# Patient Record
Sex: Female | Born: 1955
Health system: Southern US, Community
[De-identification: ages and names within clinical notes are randomized; demographics above are authoritative.]

## PROBLEM LIST (undated history)

## (undated) DIAGNOSIS — Z9889 Other specified postprocedural states: Secondary | ICD-10-CM

## (undated) DIAGNOSIS — R519 Headache, unspecified: Secondary | ICD-10-CM

## (undated) DIAGNOSIS — N3281 Overactive bladder: Secondary | ICD-10-CM

## (undated) DIAGNOSIS — M79606 Pain in leg, unspecified: Secondary | ICD-10-CM

## (undated) DIAGNOSIS — R102 Pelvic and perineal pain: Secondary | ICD-10-CM

## (undated) DIAGNOSIS — F99 Mental disorder, not otherwise specified: Secondary | ICD-10-CM

## (undated) DIAGNOSIS — M797 Fibromyalgia: Secondary | ICD-10-CM

## (undated) DIAGNOSIS — M549 Dorsalgia, unspecified: Secondary | ICD-10-CM

## (undated) DIAGNOSIS — G4733 Obstructive sleep apnea (adult) (pediatric): Secondary | ICD-10-CM

## (undated) DIAGNOSIS — G8929 Other chronic pain: Secondary | ICD-10-CM

## (undated) DIAGNOSIS — I10 Essential (primary) hypertension: Secondary | ICD-10-CM

## (undated) DIAGNOSIS — R569 Unspecified convulsions: Secondary | ICD-10-CM

## (undated) DIAGNOSIS — E7800 Pure hypercholesterolemia, unspecified: Secondary | ICD-10-CM

## (undated) DIAGNOSIS — R51 Headache: Secondary | ICD-10-CM

## (undated) DIAGNOSIS — E119 Type 2 diabetes mellitus without complications: Secondary | ICD-10-CM

## (undated) DIAGNOSIS — N816 Rectocele: Secondary | ICD-10-CM

## (undated) DIAGNOSIS — F410 Panic disorder [episodic paroxysmal anxiety] without agoraphobia: Secondary | ICD-10-CM

## (undated) DIAGNOSIS — E039 Hypothyroidism, unspecified: Secondary | ICD-10-CM

## (undated) HISTORY — DX: Mental disorder, not otherwise specified: F99

## (undated) HISTORY — DX: Overactive bladder: N32.81

## (undated) HISTORY — PX: OTHER SURGICAL HISTORY: SHX169

## (undated) HISTORY — PX: BACK SURGERY: SHX140

## (undated) HISTORY — DX: Unspecified convulsions: R56.9

## (undated) HISTORY — PX: ANKLE SURGERY: SHX546

## (undated) HISTORY — DX: Rectocele: N81.6

## (undated) HISTORY — PX: CARPAL TUNNEL RELEASE: SHX101

## (undated) HISTORY — PX: ABDOMINAL HYSTERECTOMY: SHX81

## (undated) HISTORY — PX: TONSILLECTOMY: SUR1361

## (undated) HISTORY — PX: TUBAL LIGATION: SHX77

---

## 2003-08-23 ENCOUNTER — Ambulatory Visit (HOSPITAL_COMMUNITY): Admission: RE | Admit: 2003-08-23 | Discharge: 2003-08-23 | Payer: Self-pay | Admitting: Family Medicine

## 2003-08-23 ENCOUNTER — Encounter: Payer: Self-pay | Admitting: Family Medicine

## 2004-04-29 ENCOUNTER — Ambulatory Visit (HOSPITAL_COMMUNITY): Admission: RE | Admit: 2004-04-29 | Discharge: 2004-04-29 | Payer: Self-pay | Admitting: Family Medicine

## 2004-08-18 ENCOUNTER — Emergency Department (HOSPITAL_COMMUNITY): Admission: EM | Admit: 2004-08-18 | Discharge: 2004-08-18 | Payer: Self-pay | Admitting: Emergency Medicine

## 2004-11-25 ENCOUNTER — Ambulatory Visit (HOSPITAL_COMMUNITY): Admission: RE | Admit: 2004-11-25 | Discharge: 2004-11-25 | Payer: Self-pay | Admitting: Family Medicine

## 2004-12-14 ENCOUNTER — Emergency Department (HOSPITAL_COMMUNITY): Admission: EM | Admit: 2004-12-14 | Discharge: 2004-12-14 | Payer: Self-pay | Admitting: Emergency Medicine

## 2005-01-22 ENCOUNTER — Inpatient Hospital Stay (HOSPITAL_COMMUNITY): Admission: RE | Admit: 2005-01-22 | Discharge: 2005-01-26 | Payer: Self-pay | Admitting: Orthopaedic Surgery

## 2005-03-04 ENCOUNTER — Encounter (HOSPITAL_COMMUNITY): Admission: RE | Admit: 2005-03-04 | Discharge: 2005-04-03 | Payer: Self-pay | Admitting: Orthopaedic Surgery

## 2005-08-29 ENCOUNTER — Observation Stay (HOSPITAL_COMMUNITY): Admission: EM | Admit: 2005-08-29 | Discharge: 2005-08-30 | Payer: Self-pay | Admitting: Emergency Medicine

## 2005-08-30 ENCOUNTER — Ambulatory Visit: Payer: Self-pay | Admitting: *Deleted

## 2005-09-07 ENCOUNTER — Ambulatory Visit: Payer: Self-pay | Admitting: *Deleted

## 2005-09-07 ENCOUNTER — Encounter (HOSPITAL_COMMUNITY): Admission: RE | Admit: 2005-09-07 | Discharge: 2005-09-08 | Payer: Self-pay | Admitting: *Deleted

## 2005-09-15 ENCOUNTER — Ambulatory Visit: Payer: Self-pay | Admitting: *Deleted

## 2005-09-30 ENCOUNTER — Inpatient Hospital Stay (HOSPITAL_BASED_OUTPATIENT_CLINIC_OR_DEPARTMENT_OTHER): Admission: RE | Admit: 2005-09-30 | Discharge: 2005-09-30 | Payer: Self-pay | Admitting: *Deleted

## 2005-09-30 ENCOUNTER — Ambulatory Visit: Payer: Self-pay | Admitting: *Deleted

## 2005-10-20 ENCOUNTER — Ambulatory Visit: Payer: Self-pay | Admitting: *Deleted

## 2005-11-29 ENCOUNTER — Ambulatory Visit: Admission: RE | Admit: 2005-11-29 | Discharge: 2005-11-29 | Payer: Self-pay | Admitting: Family Medicine

## 2005-12-10 ENCOUNTER — Ambulatory Visit: Payer: Self-pay | Admitting: Pulmonary Disease

## 2006-01-23 ENCOUNTER — Emergency Department (HOSPITAL_COMMUNITY): Admission: EM | Admit: 2006-01-23 | Discharge: 2006-01-23 | Payer: Self-pay | Admitting: Emergency Medicine

## 2006-01-31 ENCOUNTER — Ambulatory Visit: Payer: Self-pay | Admitting: Internal Medicine

## 2006-02-09 ENCOUNTER — Ambulatory Visit: Payer: Self-pay | Admitting: Internal Medicine

## 2006-02-09 ENCOUNTER — Ambulatory Visit (HOSPITAL_COMMUNITY): Admission: RE | Admit: 2006-02-09 | Discharge: 2006-02-09 | Payer: Self-pay | Admitting: Internal Medicine

## 2006-07-26 ENCOUNTER — Ambulatory Visit (HOSPITAL_COMMUNITY): Admission: RE | Admit: 2006-07-26 | Discharge: 2006-07-26 | Payer: Self-pay | Admitting: Family Medicine

## 2006-09-14 ENCOUNTER — Ambulatory Visit (HOSPITAL_COMMUNITY): Admission: RE | Admit: 2006-09-14 | Discharge: 2006-09-14 | Payer: Self-pay | Admitting: Family Medicine

## 2007-01-02 ENCOUNTER — Inpatient Hospital Stay (HOSPITAL_COMMUNITY): Admission: RE | Admit: 2007-01-02 | Discharge: 2007-01-06 | Payer: Self-pay | Admitting: Orthopaedic Surgery

## 2007-02-08 ENCOUNTER — Encounter (HOSPITAL_COMMUNITY): Admission: RE | Admit: 2007-02-08 | Discharge: 2007-03-10 | Payer: Self-pay | Admitting: Orthopaedic Surgery

## 2007-05-19 ENCOUNTER — Ambulatory Visit (HOSPITAL_COMMUNITY): Admission: RE | Admit: 2007-05-19 | Discharge: 2007-05-19 | Payer: Self-pay | Admitting: Obstetrics & Gynecology

## 2007-08-15 ENCOUNTER — Ambulatory Visit: Payer: Self-pay | Admitting: Cardiology

## 2007-08-15 ENCOUNTER — Observation Stay (HOSPITAL_COMMUNITY): Admission: AD | Admit: 2007-08-15 | Discharge: 2007-08-16 | Payer: Self-pay | Admitting: Family Medicine

## 2007-09-07 ENCOUNTER — Emergency Department (HOSPITAL_COMMUNITY): Admission: EM | Admit: 2007-09-07 | Discharge: 2007-09-07 | Payer: Self-pay | Admitting: Emergency Medicine

## 2008-01-04 ENCOUNTER — Emergency Department (HOSPITAL_COMMUNITY): Admission: EM | Admit: 2008-01-04 | Discharge: 2008-01-04 | Payer: Self-pay | Admitting: Emergency Medicine

## 2008-05-10 ENCOUNTER — Ambulatory Visit (HOSPITAL_COMMUNITY): Admission: RE | Admit: 2008-05-10 | Discharge: 2008-05-10 | Payer: Self-pay | Admitting: Family Medicine

## 2008-10-07 ENCOUNTER — Emergency Department (HOSPITAL_COMMUNITY): Admission: EM | Admit: 2008-10-07 | Discharge: 2008-10-07 | Payer: Self-pay | Admitting: Emergency Medicine

## 2009-01-01 ENCOUNTER — Emergency Department (HOSPITAL_COMMUNITY): Admission: EM | Admit: 2009-01-01 | Discharge: 2009-01-01 | Payer: Self-pay | Admitting: Emergency Medicine

## 2009-01-08 ENCOUNTER — Ambulatory Visit (HOSPITAL_COMMUNITY): Admission: RE | Admit: 2009-01-08 | Discharge: 2009-01-08 | Payer: Self-pay | Admitting: Family Medicine

## 2009-06-02 ENCOUNTER — Emergency Department (HOSPITAL_COMMUNITY): Admission: EM | Admit: 2009-06-02 | Discharge: 2009-06-02 | Payer: Self-pay | Admitting: Emergency Medicine

## 2009-06-08 ENCOUNTER — Emergency Department (HOSPITAL_COMMUNITY): Admission: EM | Admit: 2009-06-08 | Discharge: 2009-06-08 | Payer: Self-pay | Admitting: Emergency Medicine

## 2009-07-02 ENCOUNTER — Ambulatory Visit (HOSPITAL_COMMUNITY): Admission: RE | Admit: 2009-07-02 | Discharge: 2009-07-02 | Payer: Self-pay | Admitting: Family Medicine

## 2009-07-02 ENCOUNTER — Inpatient Hospital Stay (HOSPITAL_COMMUNITY): Admission: AD | Admit: 2009-07-02 | Discharge: 2009-07-07 | Payer: Self-pay | Admitting: Emergency Medicine

## 2009-07-02 ENCOUNTER — Ambulatory Visit: Payer: Self-pay | Admitting: Orthopedic Surgery

## 2009-07-03 ENCOUNTER — Encounter: Payer: Self-pay | Admitting: Orthopedic Surgery

## 2009-07-06 ENCOUNTER — Encounter: Payer: Self-pay | Admitting: Orthopedic Surgery

## 2009-07-22 ENCOUNTER — Ambulatory Visit: Payer: Self-pay | Admitting: Orthopedic Surgery

## 2009-07-22 DIAGNOSIS — M86679 Other chronic osteomyelitis, unspecified ankle and foot: Secondary | ICD-10-CM | POA: Insufficient documentation

## 2009-07-22 DIAGNOSIS — I10 Essential (primary) hypertension: Secondary | ICD-10-CM

## 2009-08-11 ENCOUNTER — Encounter: Payer: Self-pay | Admitting: Orthopedic Surgery

## 2009-08-13 ENCOUNTER — Ambulatory Visit: Payer: Self-pay | Admitting: Orthopedic Surgery

## 2009-09-04 ENCOUNTER — Encounter: Payer: Self-pay | Admitting: Orthopedic Surgery

## 2009-10-24 ENCOUNTER — Emergency Department (HOSPITAL_COMMUNITY): Admission: EM | Admit: 2009-10-24 | Discharge: 2009-10-24 | Payer: Self-pay | Admitting: Emergency Medicine

## 2009-10-26 ENCOUNTER — Emergency Department (HOSPITAL_COMMUNITY): Admission: EM | Admit: 2009-10-26 | Discharge: 2009-10-26 | Payer: Self-pay | Admitting: Emergency Medicine

## 2010-01-05 ENCOUNTER — Emergency Department (HOSPITAL_COMMUNITY): Admission: EM | Admit: 2010-01-05 | Discharge: 2010-01-05 | Payer: Self-pay | Admitting: Emergency Medicine

## 2010-03-02 ENCOUNTER — Ambulatory Visit (HOSPITAL_COMMUNITY): Admission: RE | Admit: 2010-03-02 | Discharge: 2010-03-02 | Payer: Self-pay | Admitting: Family Medicine

## 2010-03-26 ENCOUNTER — Ambulatory Visit (HOSPITAL_COMMUNITY): Admission: RE | Admit: 2010-03-26 | Discharge: 2010-03-26 | Payer: Self-pay | Admitting: Orthopaedic Surgery

## 2010-05-26 ENCOUNTER — Ambulatory Visit (HOSPITAL_COMMUNITY)
Admission: RE | Admit: 2010-05-26 | Discharge: 2010-05-26 | Payer: Self-pay | Source: Home / Self Care | Admitting: Orthopaedic Surgery

## 2010-07-13 ENCOUNTER — Encounter (HOSPITAL_COMMUNITY): Admission: RE | Admit: 2010-07-13 | Discharge: 2010-08-12 | Payer: Self-pay | Admitting: Orthopaedic Surgery

## 2010-07-29 ENCOUNTER — Emergency Department (HOSPITAL_COMMUNITY): Admission: EM | Admit: 2010-07-29 | Discharge: 2010-07-29 | Payer: Self-pay | Admitting: Emergency Medicine

## 2010-08-06 ENCOUNTER — Ambulatory Visit (HOSPITAL_COMMUNITY): Admission: RE | Admit: 2010-08-06 | Discharge: 2010-08-06 | Payer: Self-pay | Admitting: Family Medicine

## 2010-08-18 ENCOUNTER — Encounter (HOSPITAL_COMMUNITY): Admission: RE | Admit: 2010-08-18 | Discharge: 2010-09-17 | Payer: Self-pay | Admitting: Orthopaedic Surgery

## 2010-08-23 ENCOUNTER — Emergency Department (HOSPITAL_COMMUNITY): Admission: EM | Admit: 2010-08-23 | Discharge: 2010-08-23 | Payer: Self-pay | Admitting: Emergency Medicine

## 2010-10-25 ENCOUNTER — Emergency Department (HOSPITAL_COMMUNITY): Admission: EM | Admit: 2010-10-25 | Discharge: 2010-10-25 | Payer: Self-pay | Admitting: Emergency Medicine

## 2011-02-09 ENCOUNTER — Emergency Department (HOSPITAL_COMMUNITY)
Admission: EM | Admit: 2011-02-09 | Discharge: 2011-02-09 | Disposition: A | Discharge status: Home / Self Care | Payer: Medicare Other | Attending: Emergency Medicine | Admitting: Emergency Medicine

## 2011-02-09 ENCOUNTER — Emergency Department (HOSPITAL_COMMUNITY): Payer: Medicare Other

## 2011-02-09 DIAGNOSIS — R0602 Shortness of breath: Secondary | ICD-10-CM | POA: Insufficient documentation

## 2011-02-09 DIAGNOSIS — Z79899 Other long term (current) drug therapy: Secondary | ICD-10-CM | POA: Insufficient documentation

## 2011-02-09 DIAGNOSIS — R5383 Other fatigue: Secondary | ICD-10-CM | POA: Insufficient documentation

## 2011-02-09 DIAGNOSIS — R059 Cough, unspecified: Secondary | ICD-10-CM | POA: Insufficient documentation

## 2011-02-09 DIAGNOSIS — R5381 Other malaise: Secondary | ICD-10-CM | POA: Insufficient documentation

## 2011-02-09 DIAGNOSIS — R071 Chest pain on breathing: Secondary | ICD-10-CM | POA: Insufficient documentation

## 2011-02-09 DIAGNOSIS — R05 Cough: Secondary | ICD-10-CM | POA: Insufficient documentation

## 2011-02-09 DIAGNOSIS — I10 Essential (primary) hypertension: Secondary | ICD-10-CM | POA: Insufficient documentation

## 2011-02-09 DIAGNOSIS — J45909 Unspecified asthma, uncomplicated: Secondary | ICD-10-CM | POA: Insufficient documentation

## 2011-02-09 DIAGNOSIS — E7800 Pure hypercholesterolemia, unspecified: Secondary | ICD-10-CM | POA: Insufficient documentation

## 2011-02-25 LAB — BASIC METABOLIC PANEL
BUN: 12 mg/dL (ref 6–23)
Calcium: 9.1 mg/dL (ref 8.4–10.5)
GFR calc non Af Amer: >60 mL/min (ref >60)
Glucose, Bld: 110 mg/dL — ABNORMAL HIGH (ref 70–99)
Potassium: 4.7 mEq/L (ref 3.5–5.1)
Sodium: 141 mEq/L (ref 135–145)

## 2011-02-25 LAB — URINALYSIS, ROUTINE W REFLEX MICROSCOPIC
Bilirubin Urine: NEGATIVE
Ketones, ur: NEGATIVE mg/dL
Nitrite: NEGATIVE
Specific Gravity, Urine: 1.015 (ref 1.005–1.030)
Urobilinogen, UA: 0.2 mg/dL (ref 0.0–1.0)
pH: 7 (ref 5.0–8.0)

## 2011-02-25 LAB — CBC
MCHC: 34 g/dL (ref 30.0–36.0)
RDW: 13.5 % (ref 11.5–15.5)
WBC: 4.9 10*3/uL (ref 4.0–10.5)

## 2011-02-25 LAB — DIFFERENTIAL
Basophils Absolute: 0.1 10*3/uL (ref 0.0–0.1)
Basophils Relative: 1 % (ref 0–1)
Lymphocytes Relative: 35 % (ref 12–46)
Neutro Abs: 2.5 10*3/uL (ref 1.7–7.7)
Neutrophils Relative %: 51 % (ref 43–77)

## 2011-02-28 LAB — BASIC METABOLIC PANEL
Chloride: 102 mEq/L (ref 96–112)
GFR calc Af Amer: >60 mL/min (ref >60)
GFR calc non Af Amer: >60 mL/min (ref >60)
Potassium: 4.2 mEq/L (ref 3.5–5.1)
Sodium: 136 mEq/L (ref 135–145)

## 2011-02-28 LAB — POCT CARDIAC MARKERS
CKMB, poc: 1.2 ng/mL (ref 1.0–8.0)
Myoglobin, poc: 72.3 ng/mL (ref 12–200)
Myoglobin, poc: 83.9 ng/mL (ref 12–200)

## 2011-02-28 LAB — D-DIMER, QUANTITATIVE: D-Dimer, Quant: 0.27 ug/mL-FEU (ref 0.00–0.48)

## 2011-02-28 LAB — CBC
HCT: 40 % (ref 36.0–46.0)
Hemoglobin: 13.6 g/dL (ref 12.0–15.0)
Platelets: 179 10*3/uL (ref 150–400)
RBC: 4.58 MIL/uL (ref 3.87–5.11)
WBC: 4.4 10*3/uL (ref 4.0–10.5)

## 2011-02-28 LAB — DIFFERENTIAL
Eosinophils Absolute: 0.1 10*3/uL (ref 0.0–0.7)
Eosinophils Relative: 3 % (ref 0–5)
Lymphs Abs: 1.4 10*3/uL (ref 0.7–4.0)
Monocytes Relative: 11 % (ref 3–12)

## 2011-03-01 LAB — URINALYSIS, ROUTINE W REFLEX MICROSCOPIC
Glucose, UA: NEGATIVE mg/dL
Hgb urine dipstick: NEGATIVE
Specific Gravity, Urine: >1.03 — ABNORMAL HIGH (ref 1.005–1.030)
Urobilinogen, UA: 0.2 mg/dL (ref 0.0–1.0)

## 2011-03-01 LAB — COMPREHENSIVE METABOLIC PANEL
AST: 32 U/L (ref 0–37)
Albumin: 4 g/dL (ref 3.5–5.2)
Calcium: 9.2 mg/dL (ref 8.4–10.5)
Creatinine, Ser: 0.86 mg/dL (ref 0.4–1.2)
GFR calc Af Amer: >60 mL/min (ref >60)

## 2011-03-01 LAB — DIFFERENTIAL
Eosinophils Relative: 2 % (ref 0–5)
Lymphocytes Relative: 32 % (ref 12–46)
Lymphs Abs: 1.7 10*3/uL (ref 0.7–4.0)
Monocytes Absolute: 0.6 10*3/uL (ref 0.1–1.0)

## 2011-03-01 LAB — CBC
MCHC: 34.2 g/dL (ref 30.0–36.0)
MCV: 87.3 fL (ref 78.0–100.0)
Platelets: 177 10*3/uL (ref 150–400)

## 2011-03-01 LAB — SURGICAL PCR SCREEN: Staphylococcus aureus: NEGATIVE

## 2011-03-03 LAB — COMPREHENSIVE METABOLIC PANEL
ALT: 33 U/L (ref 0–35)
AST: 32 U/L (ref 0–37)
Alkaline Phosphatase: 81 U/L (ref 39–117)
CO2: 28 mEq/L (ref 19–32)
Chloride: 104 mEq/L (ref 96–112)
Creatinine, Ser: 0.84 mg/dL (ref 0.4–1.2)
GFR calc Af Amer: >60 mL/min (ref >60)
GFR calc non Af Amer: >60 mL/min (ref >60)
Potassium: 4.4 mEq/L (ref 3.5–5.1)
Total Bilirubin: 0.8 mg/dL (ref 0.3–1.2)

## 2011-03-03 LAB — DIFFERENTIAL
Basophils Absolute: 0 10*3/uL (ref 0.0–0.1)
Basophils Relative: 1 % (ref 0–1)
Eosinophils Absolute: 0.1 10*3/uL (ref 0.0–0.7)
Eosinophils Relative: 3 % (ref 0–5)
Monocytes Absolute: 0.5 10*3/uL (ref 0.1–1.0)

## 2011-03-03 LAB — URINALYSIS, ROUTINE W REFLEX MICROSCOPIC
Bilirubin Urine: NEGATIVE
Ketones, ur: NEGATIVE mg/dL
Nitrite: NEGATIVE
Protein, ur: NEGATIVE mg/dL

## 2011-03-03 LAB — CBC
MCV: 86.1 fL (ref 78.0–100.0)
RBC: 4.63 MIL/uL (ref 3.87–5.11)
WBC: 4.9 10*3/uL (ref 4.0–10.5)

## 2011-03-10 ENCOUNTER — Other Ambulatory Visit: Payer: Self-pay | Admitting: Family Medicine

## 2011-03-17 LAB — BASIC METABOLIC PANEL
BUN: 13 mg/dL (ref 6–23)
BUN: 9 mg/dL (ref 6–23)
Chloride: 104 mEq/L (ref 96–112)
Creatinine, Ser: 0.81 mg/dL (ref 0.4–1.2)
Creatinine, Ser: 0.87 mg/dL (ref 0.4–1.2)
GFR calc Af Amer: >60 mL/min (ref >60)
GFR calc non Af Amer: >60 mL/min (ref >60)
GFR calc non Af Amer: >60 mL/min (ref >60)
Glucose, Bld: 114 mg/dL — ABNORMAL HIGH (ref 70–99)
Potassium: 4.2 mEq/L (ref 3.5–5.1)

## 2011-03-17 LAB — CBC
MCV: 86.2 fL (ref 78.0–100.0)
Platelets: 183 10*3/uL (ref 150–400)
RBC: 4.53 MIL/uL (ref 3.87–5.11)
WBC: 4.1 10*3/uL (ref 4.0–10.5)

## 2011-03-17 LAB — POCT CARDIAC MARKERS
CKMB, poc: 1.4 ng/mL (ref 1.0–8.0)
CKMB, poc: <1 ng/mL — ABNORMAL LOW (ref 1.0–8.0)
Myoglobin, poc: 75.8 ng/mL (ref 12–200)
Troponin i, poc: <0.05 ng/mL (ref 0.00–0.09)
Troponin i, poc: <0.05 ng/mL (ref 0.00–0.09)
Troponin i, poc: <0.05 ng/mL (ref 0.00–0.09)

## 2011-03-17 LAB — DIFFERENTIAL
Lymphocytes Relative: 34 % (ref 12–46)
Lymphs Abs: 1.4 10*3/uL (ref 0.7–4.0)
Monocytes Relative: 9 % (ref 3–12)
Neutrophils Relative %: 53 % (ref 43–77)

## 2011-03-17 LAB — D-DIMER, QUANTITATIVE: D-Dimer, Quant: 0.22 ug/mL-FEU (ref 0.00–0.48)

## 2011-03-21 LAB — COMPREHENSIVE METABOLIC PANEL
ALT: 23 U/L (ref 0–35)
ALT: 35 U/L (ref 0–35)
ALT: 35 U/L (ref 0–35)
AST: 27 U/L (ref 0–37)
AST: 36 U/L (ref 0–37)
AST: 38 U/L — ABNORMAL HIGH (ref 0–37)
Albumin: 3.5 g/dL (ref 3.5–5.2)
Alkaline Phosphatase: 73 U/L (ref 39–117)
Alkaline Phosphatase: 79 U/L (ref 39–117)
BUN: 14 mg/dL (ref 6–23)
CO2: 25 mEq/L (ref 19–32)
CO2: 31 mEq/L (ref 19–32)
CO2: 31 mEq/L (ref 19–32)
Calcium: 9.1 mg/dL (ref 8.4–10.5)
Chloride: 100 mEq/L (ref 96–112)
Chloride: 105 mEq/L (ref 96–112)
Creatinine, Ser: 0.74 mg/dL (ref 0.4–1.2)
GFR calc Af Amer: >60 mL/min (ref >60)
GFR calc Af Amer: >60 mL/min (ref >60)
GFR calc Af Amer: >60 mL/min (ref >60)
GFR calc non Af Amer: >60 mL/min (ref >60)
GFR calc non Af Amer: >60 mL/min (ref >60)
GFR calc non Af Amer: >60 mL/min (ref >60)
Glucose, Bld: 107 mg/dL — ABNORMAL HIGH (ref 70–99)
Glucose, Bld: 125 mg/dL — ABNORMAL HIGH (ref 70–99)
Potassium: 4 mEq/L (ref 3.5–5.1)
Sodium: 137 mEq/L (ref 135–145)
Sodium: 141 mEq/L (ref 135–145)
Total Bilirubin: 0.8 mg/dL (ref 0.3–1.2)
Total Bilirubin: 0.9 mg/dL (ref 0.3–1.2)
Total Protein: 6.2 g/dL (ref 6.0–8.3)
Total Protein: 6.3 g/dL (ref 6.0–8.3)

## 2011-03-21 LAB — URINALYSIS, ROUTINE W REFLEX MICROSCOPIC
Bilirubin Urine: NEGATIVE
Glucose, UA: NEGATIVE mg/dL
Hgb urine dipstick: NEGATIVE
Ketones, ur: NEGATIVE mg/dL
Nitrite: NEGATIVE
Protein, ur: NEGATIVE mg/dL
Specific Gravity, Urine: 1.025 (ref 1.005–1.030)
Urobilinogen, UA: 0.2 mg/dL (ref 0.0–1.0)
pH: 6 (ref 5.0–8.0)

## 2011-03-21 LAB — BASIC METABOLIC PANEL
BUN: 11 mg/dL (ref 6–23)
CO2: 32 mEq/L (ref 19–32)
Calcium: 9 mg/dL (ref 8.4–10.5)
Chloride: 105 mEq/L (ref 96–112)
Creatinine, Ser: 0.93 mg/dL (ref 0.4–1.2)
GFR calc Af Amer: >60 mL/min (ref >60)
GFR calc non Af Amer: >60 mL/min (ref >60)
Glucose, Bld: 108 mg/dL — ABNORMAL HIGH (ref 70–99)
Potassium: 4.7 mEq/L (ref 3.5–5.1)
Sodium: 140 mEq/L (ref 135–145)

## 2011-03-21 LAB — GLUCOSE, CAPILLARY: Glucose-Capillary: 104 mg/dL — ABNORMAL HIGH (ref 70–99)

## 2011-03-21 LAB — CBC
HCT: 39.2 % (ref 36.0–46.0)
HCT: 39.2 % (ref 36.0–46.0)
Hemoglobin: 13.7 g/dL (ref 12.0–15.0)
MCHC: 34.9 g/dL (ref 30.0–36.0)
MCHC: 35 g/dL (ref 30.0–36.0)
MCV: 87.9 fL (ref 78.0–100.0)
MCV: 88.4 fL (ref 78.0–100.0)
Platelets: 175 10*3/uL (ref 150–400)
Platelets: 177 10*3/uL (ref 150–400)
RBC: 4.44 MIL/uL (ref 3.87–5.11)
RBC: 4.65 MIL/uL (ref 3.87–5.11)
RDW: 13.1 % (ref 11.5–15.5)
RDW: 13.3 % (ref 11.5–15.5)
WBC: 3.9 10*3/uL — ABNORMAL LOW (ref 4.0–10.5)
WBC: 4.3 10*3/uL (ref 4.0–10.5)
WBC: 4.5 10*3/uL (ref 4.0–10.5)

## 2011-03-21 LAB — CARDIAC PANEL(CRET KIN+CKTOT+MB+TROPI)
CK, MB: 2.2 ng/mL (ref 0.3–4.0)
Relative Index: 1.8 (ref 0.0–2.5)
Total CK: 125 U/L (ref 7–177)
Troponin I: 0.02 ng/mL (ref 0.00–0.06)

## 2011-03-21 LAB — LIPID PANEL
Cholesterol: 143 mg/dL (ref 0–200)
HDL: 38 mg/dL — ABNORMAL LOW (ref >39)
Total CHOL/HDL Ratio: 3.8 RATIO

## 2011-03-21 LAB — DIFFERENTIAL
Basophils Absolute: 0 10*3/uL (ref 0.0–0.1)
Basophils Absolute: 0 10*3/uL (ref 0.0–0.1)
Basophils Absolute: 0.1 10*3/uL (ref 0.0–0.1)
Basophils Relative: 1 % (ref 0–1)
Basophils Relative: 1 % (ref 0–1)
Basophils Relative: 2 % — ABNORMAL HIGH (ref 0–1)
Eosinophils Absolute: 0.1 10*3/uL (ref 0.0–0.7)
Eosinophils Absolute: 0.1 10*3/uL (ref 0.0–0.7)
Eosinophils Absolute: 0.2 10*3/uL (ref 0.0–0.7)
Eosinophils Relative: 3 % (ref 0–5)
Eosinophils Relative: 4 % (ref 0–5)
Eosinophils Relative: 4 % (ref 0–5)
Lymphocytes Relative: 37 % (ref 12–46)
Lymphocytes Relative: 42 % (ref 12–46)
Lymphocytes Relative: 42 % (ref 12–46)
Lymphs Abs: 1.6 10*3/uL (ref 0.7–4.0)
Lymphs Abs: 1.7 10*3/uL (ref 0.7–4.0)
Lymphs Abs: 1.9 10*3/uL (ref 0.7–4.0)
Monocytes Absolute: 0.5 10*3/uL (ref 0.1–1.0)
Monocytes Absolute: 0.5 10*3/uL (ref 0.1–1.0)
Monocytes Absolute: 0.6 10*3/uL (ref 0.1–1.0)
Monocytes Relative: 12 % (ref 3–12)
Monocytes Relative: 12 % (ref 3–12)
Monocytes Relative: 13 % — ABNORMAL HIGH (ref 3–12)
Neutro Abs: 1.6 10*3/uL — ABNORMAL LOW (ref 1.7–7.7)
Neutro Abs: 1.9 10*3/uL (ref 1.7–7.7)
Neutro Abs: 2 10*3/uL (ref 1.7–7.7)
Neutrophils Relative %: 41 % — ABNORMAL LOW (ref 43–77)
Neutrophils Relative %: 42 % — ABNORMAL LOW (ref 43–77)
Neutrophils Relative %: 46 % (ref 43–77)

## 2011-03-21 LAB — T4, FREE: Free T4: 0.83 ng/dL (ref 0.80–1.80)

## 2011-03-21 LAB — VANCOMYCIN, TROUGH: Vancomycin Tr: 15.7 ug/mL (ref 10.0–20.0)

## 2011-03-21 LAB — TSH: TSH: 2.724 u[IU]/mL (ref 0.350–4.500)

## 2011-03-21 LAB — C-REACTIVE PROTEIN: CRP: 0.1 mg/dL — ABNORMAL LOW (ref <0.6)

## 2011-03-21 LAB — SEDIMENTATION RATE: Sed Rate: 2 mm/hr (ref 0–22)

## 2011-03-22 LAB — BASIC METABOLIC PANEL
BUN: 11 mg/dL (ref 6–23)
Calcium: 9.4 mg/dL (ref 8.4–10.5)
Creatinine, Ser: 0.71 mg/dL (ref 0.4–1.2)
GFR calc non Af Amer: >60 mL/min (ref >60)
Glucose, Bld: 102 mg/dL — ABNORMAL HIGH (ref 70–99)

## 2011-03-22 LAB — DIFFERENTIAL
Basophils Absolute: 0 10*3/uL (ref 0.0–0.1)
Lymphocytes Relative: 32 % (ref 12–46)
Lymphs Abs: 1.3 10*3/uL (ref 0.7–4.0)
Neutrophils Relative %: 52 % (ref 43–77)

## 2011-03-22 LAB — URINALYSIS, ROUTINE W REFLEX MICROSCOPIC
Glucose, UA: NEGATIVE mg/dL
Ketones, ur: NEGATIVE mg/dL
Nitrite: NEGATIVE
Protein, ur: NEGATIVE mg/dL
pH: 6 (ref 5.0–8.0)

## 2011-03-22 LAB — POCT CARDIAC MARKERS
CKMB, poc: 1.4 ng/mL (ref 1.0–8.0)
Myoglobin, poc: 96.4 ng/mL (ref 12–200)
Troponin i, poc: <0.05 ng/mL (ref 0.00–0.09)

## 2011-03-22 LAB — CBC
Platelets: 182 10*3/uL (ref 150–400)
RDW: 14 % (ref 11.5–15.5)
WBC: 4.1 10*3/uL (ref 4.0–10.5)

## 2011-03-31 ENCOUNTER — Ambulatory Visit (HOSPITAL_COMMUNITY)
Admission: RE | Admit: 2011-03-31 | Discharge: 2011-03-31 | Disposition: A | Discharge status: Home / Self Care | Payer: Medicare Other | Source: Ambulatory Visit | Attending: Pediatrics | Admitting: Pediatrics

## 2011-03-31 ENCOUNTER — Other Ambulatory Visit (HOSPITAL_COMMUNITY): Payer: Self-pay | Admitting: Pediatrics

## 2011-03-31 DIAGNOSIS — I82409 Acute embolism and thrombosis of unspecified deep veins of unspecified lower extremity: Secondary | ICD-10-CM

## 2011-04-27 NOTE — Group Therapy Note (Signed)
NAME:  [name], [name]               ACCOUNT NO.:  [id number]   MEDICAL RECORD NO.:  [id number]          PATIENT TYPE:  INP   LOCATION:  A322                          FACILITY:  [facility]   PHYSICIAN:  Melissa L. Ladona Ridgel, MD  DATE OF BIRTH:  1956/07/15   DATE OF PROCEDURE:  07/04/2009  DATE OF DISCHARGE:                                 PROGRESS NOTE   Please note that I am seeing this patient in conjunction with our  physician's assistant.  Please consider both our written notes as well  as this dictated summary.   SUBJECTIVE:  The patient has no complaints overnight.  She reports no  pain this morning.  She reports no nausea, vomiting or diarrhea.   T-max was 98.6.  This morning her temperature is 97.4.  Blood pressure  130/92, pulse 64, respirations 20, saturation 99%.  She has had adequate  urine output and has had at least 2 stools.   PHYSICAL EXAMINATION:  I agree with our physician assistant's physical  exam and would summarize as:  GENERAL:  A morbidly obese white female in no acute distress.  HEENT:  Normocephalic, atraumatic.  Pupils equal, round, and reactive to  light.  Extraocular muscles are intact.  She has anicteric sclerae.  Examination of the mouth reveals no oral lesions or lip lesions.  NECK:  Examination of the neck reveals a short but supple neck.  She has  no JVD.  LUNGS:  Decreased but clear to auscultation.  I do not appreciate any  rhonchi, rales, or wheezes.  CARDIOVASCULAR:  Regular rate and rhythm.  Positive S1, S2.  No S3, S4.  No murmurs, rubs, or gallops.  ABDOMEN:  Obese, nontender, nondistended with positive bowel sounds.  EXTREMITIES:  Show persistent 3x5 open sore on the right foot over the  fifth metatarsal area.  There is no significant cellulitis related to  this wound, and the fluff at the center of the wound is somewhat  improved.   PERTINENT LABORATORY DATA:  White blood cell count is 3.9, hemoglobin  13.7, hematocrit 39.2, platelets of 177.   Her sodium is 140, potassium  4.7, chloride 105, CO2 32, and glucose was 108.  Her BUN is 11, and her  creatinine is 0.93.   ASSESSMENT/PLAN:  A 54 year old white female who has a traumatic injury  to the right foot resulting in an open wound over the fifth metatarsal  and subsequent osteomyelitis of the fifth digit noted on MRI.  I  reviewed the case with Dr. Romeo Apple, and at this time the patient is  appropriate for approximately 6 weeks of antibiotic therapy which can be  undertaken as an outpatient.  We will follow up with her approximately  around August 10.  The patient can set this up with his office.  1. Osteomyelitis of the fifth digit of the right foot.  The patient      will have a PICC placed today.  My recommendations for antibiotic      therapy would be to continue vancomycin at q.12 hours and change      clindamycin to  oral therapy to facilitate home delivery of      antibiotics.  Consulted with [facility] for this, and they      should be contacted at the time of discharge so that they would      know when to go out to her house.  2. Morbid obesity.  Dietary counseling has been offered.  3. Obstructive sleep apnea.  The patient refused continuous positive      airway pressure.  4. Hypertension.  Will continue with her metoprolol.  5. Hyperlipidemia.  She will be continued on Zocor.  6. Restless leg syndrome.  She will take imipramine and Lyrica.   Total time on this case is 30 minutes.      Melissa L. Ladona Ridgel, MD  Electronically Signed     MLT/MEDQ  D:  07/05/2009  T:  07/05/2009  Job:  [id number]

## 2011-04-27 NOTE — Consult Note (Signed)
NAME:  [name], [name]               ACCOUNT NO.:  [id number]   MEDICAL RECORD NO.:  [id number]          PATIENT TYPE:  INP   LOCATION:  A322                          FACILITY:  [facility]   PHYSICIAN:  Vickki Hearing, M.D.DATE OF BIRTH:  August 16, 1956   DATE OF CONSULTATION:  07/03/2009  DATE OF DISCHARGE:                                 CONSULTATION   CHIEF COMPLAINT:  Right foot ulcer.   Dr. Milinda Cave has asked me to consult via the [facility] Team  reference.   Geraldina Parrott has an ulceration of the distal aspect of the fifth  metatarsal and dorsum of the foot.  The patient states that a blunt  object fell over her foot and she had eventually developed an ulcer,  initially seen in the emergency room, followed by Dr. Milinda Cave, p.o.  antibiotics were tried, and she did not improve.  She eventually had x-  rays and an MRI which showed periostitis, possible osteomyelitis, and an  ulceration over the wound or under the wound.   PAST SURGICAL HISTORY:  The patient gives a history of previous knee  replacement and previous lumbar fusion.   PAST MEDICAL HISTORY:  Morbid obesity, sleep apnea, hypertension,  restless leg syndrome, anxiety, and hyperlipidemia.   MEDICATIONS:  Recorded.   ALLERGIES:  Allergens to CODEINE and SULFATE causes hives.   SOCIAL HISTORY:  She is married, lives with her husband, has 2 grown  children.  No significant smoking history, alcohol, or illicit drug use.   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   FAMILY HISTORY:  Father died of an MI.  Mother had a CVA and open  surgery for coronary bypass grafting.   PHYSICAL EXAMINATION:  VITAL SIGNS:  98 temperature, 60 pulse,  respiratory rate 20, and blood pressure 124/91.  GENERAL:  She has a significant obesity, grooming, and hygiene normal.   Pulse and perfusion normal in the right lower extremity.   Skin shows a 5 x 1 cm ulcer on the dorsum aspect of the fifth metatarsal  distally.  It is crusted,  dry, and without drainage.  There is some  surrounding erythema.  Ankle range of motion is not effective.  Remainder of the foot is normal.  There is some dorsal surrounding  tenderness.  She has no inguinal lymphadenopathy.  No streaking in lymph  system.   She is awake, alert, and oriented x3.  Mood and affect are normal.  She  has normal soft touch, pinprick in the right foot.  Reflexes were  deferred.   X-rays were normal.   MRI shows periostitis, bone contusion, and ulceration in the soft  tissue, and possibility of osteomyelitis cannot be ruled out at this  time.   RECOMMENDATIONS:  Continue IV antibiotics and soaks t.i.d. to the foot  and we will continue to follow.      Vickki Hearing, M.D.  Electronically Signed     SEH/MEDQ  D:  07/03/2009  T:  07/03/2009  Job:  [id number]

## 2011-04-27 NOTE — Group Therapy Note (Signed)
NAME:  [name], [name]               ACCOUNT NO.:  [id number]   MEDICAL RECORD NO.:  [id number]          PATIENT TYPE:  INP   LOCATION:  A322                          FACILITY:  [facility]   PHYSICIAN:  Melissa L. Ladona Ridgel, MD  DATE OF BIRTH:  12/05/1956   DATE OF PROCEDURE:  07/03/2009  DATE OF DISCHARGE:                                 PROGRESS NOTE   Please note this was in conjunction with our physician's assistant and  consider this dictation in conjunction with our written note.   Subjectively the patient reports feeling quite well.  She states she has  less pain in her foot.  She denies nausea or vomiting.  She had a bowel  movement and is eating well.  She has no burning with urination.   Temperature 98.8, blood pressure 124/91, pulse 60, respirations 20,  saturation 90% on room air.   PHYSICAL EXAM:  GENERAL APPEARANCE:  This is a morbidly obese white  female in no acute distress.  She is a pleasant to speak with.  HEENT:  She is normocephalic, atraumatic.  Pupils equal, round and  reactive to light.  Extraocular muscles intact.  She has anicteric  sclerae.  Examination of the nose reveals septum midline without  discharge.  Examination of the ears externally reveals no discharge.  Examination of the mouth reveals no oral or lip lesions.  NECK:  Short, thick, but supple.  No lymphadenopathy is noted.  LUNGS:  Decreased, but clear to auscultation.  There is no rhonchi,  rales or wheezes.  CARDIOVASCULAR:  Regular rate and rhythm.  Positive S1, S2.  No S3, S4.  I do not appreciate murmur, rub or gallop.  ABDOMEN:  Obese, nontender, nondistended with positive bowel sounds.  EXTREMITIES:  A small sore on her left knee.  Bilateral knees have well-  healed scars.  She has changes in the lower extremities consistent with  stasis on the left leg.  Right leg dorsum of foot has edema and an open  area approximately 3 x 5, pink with some necrotic tissue in the middle.   LABORATORIES:   Albumin was 3.5.  MRI showed ulceration and cellulitis at  the dorsum of the foot and overlying the metatarsal with osteomyelitis.  Venous Doppler lower extremities showed no occlusive disease.  Gram  stain done of the wound in Dr. Samul Dada office showed no WBCs, no  organisms.  Sodium was 139, potassium 3.9, chloride 106, CO2 was 31, BUN  was 12, creatinine 0.82.  White count 4.5, hemoglobin 13.7, hematocrit  39.2, platelets of 175.  Her urinalysis was negative.   ASSESSMENT/PLAN:  This is a pleasant 55 year old white female who  presents to the hospital because of a nonhealing wound on the right  foot.  MRI done in the outpatient shows osteomyelitis of the fifth  digit.  1. Osteomyelitis.  The patient is being treated currently with      vancomycin and clindamycin IV.  I will explore with Dr. Romeo Apple      whether or not a PICC with long-term antibiotic coverage is the  first step in this patient's care versus evaluation in the hospital      for any surgical procedure.  For now he has recommended soaking the      foot.  2. Morbid obesity.  She will be counseled on dietary issues.  3. Obstructive sleep apnea not currently on CPAP.  We can explore this      if she has not had a formal sleep study.  4. Hypertension.  Will continue her on her metoprolol.  5. Hyperlipidemia.  Will continue her on her Zocor.  6. Restless leg syndrome.  She is taking imipramine and Lyrica for      that.   Total time on this case was 40 minutes.      Melissa L. Ladona Ridgel, MD  Electronically Signed     MLT/MEDQ  D:  07/04/2009  T:  07/04/2009  Job:  [id number]

## 2011-04-27 NOTE — Consult Note (Signed)
NAME:  [name], [name]               ACCOUNT NO.:  [id number]   MEDICAL RECORD NO.:  [id number]          PATIENT TYPE:  INP   LOCATION:  A322                          FACILITY:  [facility]   PHYSICIAN:  Tilford Pillar, MD      DATE OF BIRTH:  21-Oct-1956   DATE OF CONSULTATION:  07/06/2009  DATE OF DISCHARGE:  07/07/2009                                 CONSULTATION   REASON FOR CONSULTATION:  Osteomyelitis.   HISTORY OF PRESENT ILLNESS:  The patient is a 55 year old female with a  history of osteomyelitis of the right foot.  She has been a direct  admission from Dr. Samul Dada office and admitted for continued  management and intervention.  During her hospitalization, she was  started on IV vancomycin and plans were made for discharge.  Apparently,  the patient on the 24th had a PICC line attempted via a right upper  extremity approach.  Unfortunately, the PICC line continue to have  difficulties at the confluence of the right subclavian vein and right  jugular vein and had difficulties passing into the superior vena cava.  At that point, all attempts on the right side were discontinued, and it  was recommended that a left-sided approach be attempted by the PICC  nurse.  The patient, however, refused stating that she did not wish to  proceed with any additional attempts with the PICC and stated that she  had a previous central venous catheter placed in a subclavian fashion,  she tolerated extremely well, therefore, a surgical consultation was  obtained.  I did discuss with the patient her previous experience.  She  stated that she had a right-sided subclavian vein catheter placement at  the time of her previous operation.  At this time, the patient remains  stable.  Plans are for her discharge later today or within the next 24  hours.   PAST MEDICAL HISTORY:  Morbid obesity; obstructive sleep apnea requiring  CPAP, although patient is noncompliant; hypertension; history of  restless leg  syndrome, severe varicose veins; anxiety and depression;  and dyslipidemia.   PAST SURGICAL HISTORY:  Previous knee replacement and previous lumbar  fusion.   MEDICATIONS:  Aspirin 81 mg, imipramine, loratadine, Lopressor,  Ditropan, Lyrica, Zocor.   ALLERGIES:  CODEINE AND SULFA WHICH CAUSE HIVES.   SOCIAL HISTORY:  No tobacco.  No alcohol.  No recreational drug use.  The patient is currently on disability because of previous CVA.   PHYSICAL EXAMINATION:  VITAL SIGNS:  Vital signs were reviewed and are  stable.  GENERAL:  The patient is lying in a semi-upright position in a hospital  bed.  She is morbidly obese.  She is not in acute distress.  She is  alert and oriented x3.  HEENT:  Scalp.  No deformities or masses.  EYES:  Pupils are equal, round, reactive.  Extraocular movement are  intact.  No conjunctival pallor is noted.  Oral mucosa is pink.  There  are no occlusion.  NECK:  Trachea is midline.  No cervical lymphadenopathy.  Clavicles are  symmetrical.  Her upper chest and  neck are atraumatic.  There is no  evidence of any previous scars or incisional scar.  She has no edema in  the upper extremities.  PULMONARY:  Unlabored.  She is clear to auscultation.  CARDIOVASCULAR:  Regular rate and rhythm.  She has 2+ radial pulses  bilaterally and 2+ dorsalis pedis pulses bilaterally.  ABDOMEN:  Soft, nontender, nondistended.  It is obese.  EXTREMITIES:  Warm and dry.   PERTINENT LABORATORY AND RADIOGRAPHIC STUDIES:  The patient's labs are  reviewed.  She is currently on vancomycin IV.   ASSESSMENT/PLAN:  Osteomyelitis with plans for long-term outpatient  administration of vancomycin.  At this time a central catheter is  requested for continued outpatient utilization.  I did discuss at length  with the patient the prior events and also discussed with her that due  to the difficulties with the PICC that I would plan to proceed with a  left-sided subclavian vein placement.  She  did understand this and  understood the risks, benefits, and alternatives.  She did understand  risks including but not limited to the risk of bleeding, infection,  infection of the catheter requiring removal and subsequent replacement,  should she still require access as well as possibility of the  pneumothorax and thrombosis of the vein.  The patient also expresses  understanding of the procedure and did state that she tolerated the  previously placed central venous catheter prior to surgery.      Tilford Pillar, MD  Electronically Signed     BZ/MEDQ  D:  07/10/2009  T:  07/11/2009  Job:  [id number]

## 2011-04-27 NOTE — H&P (Signed)
NAME:  [name], [name]               ACCOUNT NO.:  [id number]   MEDICAL RECORD NO.:  [id number]          PATIENT TYPE:  INP   LOCATION:  A227                          FACILITY:  [facility]   PHYSICIAN:  Osvaldo Shipper, MD     DATE OF BIRTH:  September 11, 1956   DATE OF ADMISSION:  08/15/2007  DATE OF DISCHARGE:  LH                              HISTORY & PHYSICAL   The patient's primary care physician is Dr. Nicoletta Ba. She has been  seen by [facility] in the past.   ADMISSION DIAGNOSES:  1. Chest pain, atypical for coronary artery disease.  2. Possible costochondritis.  3. New onset speech impairment with stuttering, rule out stroke.  4. Obesity.  5. History of hypertension, depression and dyslipidemia.   CHIEF COMPLAINT:  Chest pain.   HISTORY OF PRESENT ILLNESS:  The patient is a 55 year old Caucasian female  who is quite obese who has hypertension, dyslipidemia, depression who  presented to her doctor's office today complaining of one-month history  of chest pain. The chest pain is located in the retrosternal area. She  described it as sharp and light, radiating to the left shoulder. It  comes and goes but pretty constant, 8/10 in intensity, increases with  exertion. Currently, it is about 2/10. There is some shortness of breath  associated with the chest pain, none currently. No nausea, vomiting. No  cough or fever. No dysphagia. She has chronic leg swelling but nothing  acute. There is no change of the pain with food intake. The pain has  worsened over the past one month.   The patient also had some stuttering that was noted. She said she has  had stuttering in the past, but this has definitely taken a turn to  worse over the past two months. She never used to stutter so much. She  has not noticed any other weakness anywhere else in her body. No  problems with ambulation.   MEDICATIONS AT HOME:  1. Oxybutynin ER 15 mg daily.  2. Citalopram 40 mg daily.  3. Lipitor 20 mg  daily.  4. Imipramine 50 mg every night.  5. Metoprolol 15 mg daily.  6. Bupropion 150 mg b.i.d.  7. She takes over-the-counter medications for leg cramps which has      quinine in it and some unknown other kind of medication.   PAST MEDICAL HISTORY:  Positive for chest pain in 2006 for which she  underwent cardiac catheterization which showed completely normal  coronaries and normal systolic function. It was thought that her pain  was secondary to anxiety. She has had bilateral total knee replacements,  one of them done January of this year. She has had ankle surgeries, back  surgery for disk prolapse, hysterectomy for uterine fibroids. She has  hypertension, obesity, dyslipidemia and depression. The patient also has  sleep apnea and is on CPAP.   SOCIAL HISTORY:  Lives in [place] with her husband and other family  membranes. She is currently unemployed. No smoking use. No alcohol use.  No illicit drug use. Independent with daily activities.   FAMILY HISTORY:  Father:  She does not know. Mother had diabetes, died  of a massive heart attack at the age of 48. Her brother had CVAs in his  22s, has had open-heart surgery for bypass grafting, and he is currently  56 years old.   REVIEW OF SYSTEMS:  GENERAL REVIEW OF SYSTEMS:  Positive for weakness,  malaise. HEENT:  Unremarkable. CARDIOVASCULAR:  As in HPI. RESPIRATORY:  As in HPI. GASTROINTESTINAL:  Unremarkable. GENITOURINARY:  Unremarkable. MUSCULOSKELETAL:  Positive for knee pain and arthritis.  ENDOCRINE:  Unremarkable. DERMATOLOGIC:  Unremarkable. NEUROLOGICAL:  Except for the speech impairment, unremarkable. PSYCHIATRIC:  Positive  for depression but no suicidal ideation.   Other systems unremarkable.   PHYSICAL EXAMINATION:  VITAL SIGNS:  Have not been taken quite yet. The  patient was direct admit. I am waiting on the vital signs to be reported  to me.  GENERAL EXAM:  This is an obese, white female in no distress or   discomfort at this time. She is noted to have significant stuttering  when she walks.  HEENT:  There is no pallor. No icterus. Oral mucous membrane is moist.  No oral lesions are noted. The patient uses corrective lenses.  NECK:  Is soft and supple. No thyromegaly is appreciated.  LUNGS:  Are clear to auscultation bilaterally. No wheezing, rales or  rhonchi.  CARDIOVASCULAR:  S1/S2 is distant, normal. No murmurs appreciated. No  S3/S4. No rubs. Possible bruit on the right side was appreciated.  ABDOMEN:  Soft, obese, nondistended. Bowel sounds are present. Had some  minimal tenderness in the epigastric area but no rebound or rigidity. No  right upper quadrant discomfort.  EXTREMITIES:  Without edema. Peripheral pulses are palpable.  DERMATOLOGIC EXAM:  Reveals scars from previous surgery over the lower  extremity; otherwise unremarkable.  NEUROLOGIC EXAM:  Except for the speech impairment, cranial nerves are  normal. Strength upper and lower extremities is normal. No cerebellar  signs.   LABORATORY DATA:  Lab results are back. CBC was unremarkable. Coags were  normal. CMP is normal. First set of cardiac markers is normal.   Chest x-ray is pending at this time.   ASSESSMENT:  This is a 55 year old Caucasian female with medical  problems as stated earlier who presents with one-month history of chest  pain. Chest pain is fairly atypical for acute coronary syndrome. She did  have some tenderness to palpation over the left side of her sternum,  over the ribs. Hence, this could be costochondritis-related pain. With  the negative catheterization just two years ago, this was unlikely to be  coronary-artery-related pain. Pulmonary embolism is also less likely  considering one month history of pain. Acid reflux disease could be  causing this pain. Of course, other functional elements such anxiety  disorder could be doing this as well. Other medical issues are stable.  She also has sleep  apnea.   PLAN:  1. Chest pain. Dr. Milinda Cave wanted [place] to observe the patient and rule      her out for acute coronary syndrome, and he also wanted [place] to see      if [facility] would reevaluate this patient. Because of      this request, I would be happy to consult [facility] at      this time. We will put her on a baby aspirin and repeat EKGs. Her      EKG currently is showing sinus bradycardia with no other acute      changes.  There is T-wave inversion in V1, and that is the only      abnormality on this EKG. Fasting lipid profile will also be checked      in the morning.  2. New onset speech difficulties. Not quite sure why this is      happening. I do not know if any psychiatric and psychological      issues could be causing her to have stuttering. We will obtain MRI      of her brain to make sure there is no stroke which could have      caused this. I will also obtained carotid Dopplers as there is      possibly a bruit on the left side.  3. Hypertension. Continue metoprolol.  4. Dyslipidemia. Continue Lipitor.  5. Depression. Continue her bupropion and imipramine and citalopram.  6. Will continue the rest of her other medications. I will try Motrin      to see if that relieves her chest pain somewhat, as it could be      coming from costochondritis.  7. DVT and GI prophylaxis will be initiated. Follow up with the      results of the chest x-ray. Will also follow up on her vital signs.      Osvaldo Shipper, MD  Electronically Signed     GK/MEDQ  D:  08/15/2007  T:  08/15/2007  Job:  [id number]   cc:   Jeoffrey Massed, MD  Fax: [phone]

## 2011-04-27 NOTE — Op Note (Signed)
NAME:  [name], [name]               ACCOUNT NO.:  [id number]   MEDICAL RECORD NO.:  [id number]          PATIENT TYPE:  INP   LOCATION:  A322                          FACILITY:  [facility]   PHYSICIAN:  Tilford Pillar, MD      DATE OF BIRTH:  [birth year]   DATE OF PROCEDURE:  07/06/2009  DATE OF DISCHARGE:  07/07/2009                               OPERATIVE REPORT   PREPROCEDURE DIAGNOSES:  Osteomyelitis.   POSTPROCEDURE DIAGNOSES:  Osteomyelitis.   PLANNED PROCEDURE:  Left subclavian vein triple-lumen catheter  placement.   SURGEON:  Dr. Tilford Pillar.   ANESTHETIC:  Local anesthetic with 1% lidocaine, 15 mL.   INDICATIONS:  The patient is a 55 year old female who presented to [facility] with osteomyelitis of the right foot.  She is scheduled to  continue outpatient vancomycin and her antibiotics.  She had a previous  attempt at placement of a PICC line, but refused additional attempts  when an unsuccessful PICC was proceeded with.  At this time she had  requested the central venous catheter placement.  The risks, benefits,  and alternatives were discussed including the process of placing the  central line.  The patient understands that this will be done under  local at the bedside.  She understands that she must be in Trendelenburg  position.  She consents and agrees to proceeding with the placement of  the catheter.   PROCEDURE:  At this time, I did prepare all instrumentation for the  placement of the triple-lumen central venous catheter prior to  positioning the patient.  She states, she has difficulties in a supine,  let alone a Trendelenburg position.  I did express the need to be in a  Trendelenburg position with the patient.  She understands this and  stated she would attempt to proceed with this.  Upon completing the  setting of the instrumentation, I then prepped the patient's neck with  chlorhexidine solution and draped in standard fashion.  The patient was  placed into a Trendelenburg position.  At this point, she did tolerate  the procedure.  After the placement of the drapes, I then discussed with  the patient the injection of local anesthetic.  Upon initial  administration of local anesthetic, the patient became quite upset and  asked to stop temporarily.  I did allow her time at this point and at  that time, she did wish to proceed, again I continued with planned  injection along the venipuncture tract with the local anesthetic.  Again, upon administering the local anesthetic, the patient again became  extremely upset and tearful and requested again stopping.  At this time  I discussed with the patient, sharp versus pressure sensation.  She did  state that she was feeling a pressure sensation and once understanding  this was a sensation that would not be alleviated with the local  anesthetic, I requested stopping.  At this time, I ceased any additional  attempts.  Local anesthetic had been instilled superficially, but there  was no placement of any local anesthetic deep to the clavicle due to the  patient's body habitus this is extremely superficial injection of the  local anesthetic and at this point, I stopped the procedure.  All sharps  were disposed off and I did place a 4 x 4 gauze over the site of local  injection.  Due to the fact this was superficial, I could not obtain a  chest x-ray.  Placement of local anesthetic did not proceed any depth  whatsoever.  Again in discussion with the patient at this time, it was  revealed that the patient had actually had the central catheter placed  once general anesthetic had been given and the patient was already  asleep.  We had discussed this prior to the procedure at which time she  denied that she had insinuated at that point that this had been placed  prior to giving the general anesthetic.  Therefore, I did discuss with  the patient possibly reattempting the left ankle PICC line and she  seems  to have tolerated it, at least the initial placements well even though  the catheter was not successfully placed.  She is a amenable to this and  at this point, all additional attempts of placing a central venous  catheter via a subclavian or internal jugular approach will be stopped.      Tilford Pillar, MD  Electronically Signed     BZ/MEDQ  D:  07/10/2009  T:  07/11/2009  Job:  [id number]

## 2011-04-27 NOTE — H&P (Signed)
NAME:  [name], [name]               ACCOUNT NO.:  [id number]   MEDICAL RECORD NO.:  [id number]          PATIENT TYPE:  INP   LOCATION:  A322                          FACILITY:  [facility]   PHYSICIAN:  Margaretmary Dys, M.D.DATE OF BIRTH:  29-Dec-1955   DATE OF ADMISSION:  07/02/2009  DATE OF DISCHARGE:  LH                              HISTORY & PHYSICAL   PRIMARY CARE PHYSICIAN:  Dr. Earley Favor.   ADMISSION DIAGNOSES:  1. Cellulitis of the right foot.  2. Contusion of the right foot.  3. Probable osteomyelitis of the fifth metatarsal bone.  4. Morbid obesity.   CHIEF COMPLAINTS:  Is a history of swelling and pain and redness in the  right foot.   HISTORY OF PRESENT ILLNESS:  [name] is a 55 year old female who is a  direct admission from Dr. Samul Dada office.   Apparently the patient's brother has a motorized wheelchair and on  06/08/2009 the wheelchair's battery was dead and she was trying to put  it in her Zenaida Niece.  Unfortunately the wheelchair fell across the top of her  right foot at about 9 o'clock in the morning of that day.  The patient  subsequently went to church and did okay, but however the pain and  swelling increased and she subsequently presented to the emergency room  here.  While she was evaluated in the emergency room, she only  complained mostly of pain with some bruising.  The patient received pain  control medications and was then discharged home.  The patient was the  followed up with her primary care physician.  She did notice that there  was increasing swelling with some probable drainage.  The patient was  diagnosed with cellulitis by her primary care physician.  Please note  that the x-rays from the 27th did not show any fracture or any concerns  for any bony abnormalities.   She then subsequently was then started on some antibiotics for suspected  cellulitis.  The patient received some Augmentin, however, there was no  improvement.  She also had some  Neosporin ointment and also some  Mupirocin cream put on it without any improvement.  The patient was seen  today by Dr. Milinda Cave for follow-up who was concerned about  osteomyelitis.  He subsequently ordered a MRI of the foot which  confirmed osteomyelitis.  The patient is now being admitted for IV  antibiotics and evaluation by orthopedic surgery.   Other than pain, the patient denies any other concerns.  The patient  says she has very poor circulation to lower extremities.  She has had  some fevers, chills, rigors.  She denies any nausea or vomiting,  abdominal pain or diarrhea.  She is otherwise pretty asymptomatic other  than her right foot swelling.   REVIEW OF SYSTEMS:  As mentioned in history of present illness as above.   PAST MEDICAL HISTORY:  1. Morbid obesity.  2. Obstructive sleep apnea on CPAP although the patient is      noncompliant.  3. Hypertension.  4. Restless leg syndrome.  5. Severe varicose veins.  6. History of  anxiety/depression.  7. Dyslipidemia.   MEDICATIONS:  1. She is on aspirin 81 mg p.o. once a day.  2. Imipramine mg p.o. nightly.  3. Loratadine 10 mg p.o. nightly.  4. Lopressor 50 mg p.o. b.i.d.  5. Ditropan 15 mg p.o. nightly.  6. Lyrica 50 mg p.o. t.i.d.  7. Zocor 40 mg p.o. nightly.   ALLERGIES:  CODEINE SULFATE which causes hives.   FAMILY HISTORY:  The patient is married.  The husband is currently at  home as he has to work the night shift.  She lives with her husband.  She has two grown children.  She denies any significant smoking.  No  alcohol or illicit drug use.  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  She is independent of her activity of daily living.   PAST SURGICAL HISTORY:  1. Multiple ankle surgeries.  2. Back surgeries for disk prolapse.  3. Hysterectomy for uterine fibroids.  4. Bilateral total knee replacements.   FAMILY HISTORY:  Father died of massive heart attack at age 72.  Mother  had CVA in the 4s and  open surgery for coronary artery bypass grafting,  otherwise was unremarkable.   PHYSICAL EXAMINATION:  GENERAL:  The patient was conscious, alert,  comfortable, very pleasant, not in acute distress.  VITAL SIGNS:  Blood pressure was 127/90, pulse of 76, respiration was  20, temperature 98.6 degrees Fahrenheit, oxygen saturation was 95% on  room air.  HEENT: Exam normocephalic, atraumatic.  Oral mucosa was dry.  No  exudates were noted.  NECK:  Neck was supple.  No JVD or lymphadenopathy.  LUNGS:  Lungs were clear clinically, good air entry bilaterally.  HEART:  S1-S2 regular.  No S3, S4, gallops or rubs.  ABDOMEN:  Was obese but soft, nontender.  Bowel sounds positive.  No  masses palpable.  EXTREMITIES:  The patient has an area of an ulcer on the lateral aspect  of her right foot.  The ulcer is approximately about 3 x 3 cm.  There is  also a central area that is expressing some yellowish material possibly  pus.  There is also surrounding cellulitis with tenderness.  There was  decreased dorsalis pedis pulsation on the right foot but little better  than on the left.  The patient also has extensive varicose veins  discoloration of her foot bilaterally.  She has trace edema.  CNS EXAM:  Was grossly intact with no focal neurological deficits.   LABORATORY AND DIAGNOSTIC DATA:  Blood glucose was 104.  MRI obtained  today shows an area of cellulitis of the dorsum of the foot involving  the midshaft of the fifth shaft of the fifth metatarsal.  The patient  also has evidence of osteomyelitis involving the fifth metatarsal bone  on the MRI, that is on the magnetic resonance imaging.   ASSESSMENT/PLAN:  This is a 55 year old female who was a direct  admission from Dr. Samul Dada office who has now been admitted with  cellulitis of the right foot with osteomyelitis seen on MRI.  This was  secondary to contusion suffered about 3 weeks ago when a motorized  wheelchair fell on her.   PLAN:   1. The patient be admitted to the medical floor.  2. Will initiate IV antibiotics with vancomycin and also with      intravenous clindamycin.  3. Cultures have been sent from Dr. Samul Dada office.  Will follow the      cultures closely to possibly  de-escalate antibiotics.  4. Indicated to the patient that she might need long-term antibiotics      due to osteomyelitis.  5. Will request the orthopedic surgeon, Dr. Romeo Apple who is on call to      evaluate the patient.  6. Will resume all the patient's all other home medications.  7. Will place on DVT prophylaxis with Lovenox.  8. The patient also be maintained on some IV fluids with normal saline      at 75 mL/hr.  9. I have discussed the above plan with the patient in detail who      verbalized full understanding, also indicated that she might need a      d PICC line.  She verbalized full understanding.      Margaretmary Dys, M.D.  Electronically Signed     AM/MEDQ  D:  07/03/2009  T:  07/03/2009  Job:  [id number]

## 2011-04-27 NOTE — Discharge Summary (Signed)
NAME:  [name], [name]               ACCOUNT NO.:  [id number]   MEDICAL RECORD NO.:  [id number]          PATIENT TYPE:  INP   LOCATION:  A227                          FACILITY:  [facility]   PHYSICIAN:  Osvaldo Shipper, MD     DATE OF BIRTH:  1956-06-03   DATE OF ADMISSION:  08/15/2007  DATE OF DISCHARGE:  09/03/2008LH                               DISCHARGE SUMMARY   PRIMARY CARE PHYSICIAN:  Jeoffrey Massed, MD.   DISCHARGE DIAGNOSES:  1. Chest pain likely secondary to costochondritis,  atypical for      coronary artery disease.  2. Stuttering, unclear etiology.  3. Abnormal MRI requiring outpatient patient neurology followup.  4. History of depression.  5. History of hypertension, stable.   Please review H&P dictated yesterday for details regarding patient's  presenting illness.   Briefly, this is a 55 year old obese Caucasian female who presented with  a 39-month history of chest pain in the retrosternal area.  The pain was  atypical for cardiac etiology.  She had some palpable tenderness as  well.  Etiology for the chest pain is likely costochondritis or acid  reflux disease.  The patient ws seen by [facility], and Dr.  Dietrich Pates also felt that this was not cardiac in origin considering  negative cardiac catheterization just a couple of years ago.  Hence, no  further cardiac workup is necessary at this time.   A 2-D echocardiogram was ordered and was done today.  The report is  pending.  However, she need not wait in the hospital for the results at  this time.  Her telemetry has been without any arrhythmia.  She has  ruled out for acute coronary syndrome by serial cardiac enzymes.  She  can take over-the-counter Motrin for pain related to costochondritis.  She will be on a baby aspirin, and we will put her on a proton pump  inhibitor.   The patient also complained of stuttering which has worsened over the  past 2 months.  The etiology for this is not very clear.  We did  obtain  MRI of the brain to make sure there was no acute stroke.  The MRI did  not show any stroke; however, there were 3 white matter abnormalities in  the right hemisphere which could not be further characterized by the  radiologist.  It could be a demyelinating process.  However, I think  this merits at least outpatient neurology evaluation.  Carotid Dopplers  were unremarkable for stenosis.   Her other medical issues have been stable including dyslipidemia,  hypertension, and depression.  Her lipid profile is pending at this  time.   DISCHARGE MEDICATIONS:  1. Oxybutynin ER 50 mg daily.  2. Citalopram 40 mg daily.  3. Lipitor 20 mg daily.  4. Imipramine 50 mg daily.  5. Metoprolol 50 mg daily.  6. Bupropion ER 150 mg, could be twice daily; it is not very clear.  7. Prilosec 20 mg daily.  8. Enteric-coated aspirin 81 mg daily.  9. Motrin 400 mg 3 times a day with meals as needed for pain in  the      chest.   FOLLOWUP:  1. Kofi A. Doonquah, M.D. in 1-2 weeks for abnormal MRI findings.  2. Primary care physician in 2-3 weeks.  3. Results of echocardiogram to followed up on.  4. Results of lipid profile to be followed up on.   DIET:  She can have a heart-healthy diet.   ACTIVITY:  Increase activity slowly.   CONSULTATIONS:  [facility], Gerrit Friends. Dietrich Pates, MD, [facility].   Total time at discharge:  35 minutes.      Osvaldo Shipper, MD  Electronically Signed     GK/MEDQ  D:  08/16/2007  T:  08/16/2007  Job:  [id number]   cc:   Jeoffrey Massed, MD  Fax: [phone]   Gerrit Friends. Dietrich Pates, MD, [facility]  [address]  [place], [place] [address]   Kofi A. Gerilyn Pilgrim, M.D.  Fax: [phone]

## 2011-04-27 NOTE — Procedures (Signed)
NAME:  [name], [name]               ACCOUNT NO.:  [id number]   MEDICAL RECORD NO.:  [id number]          PATIENT TYPE:  INP   LOCATION:  A227                          FACILITY:  [facility]   PHYSICIAN:  Gerrit Friends. Dietrich Pates, MD, FACCDATE OF BIRTH:  15-May-1956   DATE OF PROCEDURE:  08/16/2007  DATE OF DISCHARGE:  08/16/2007                                ECHOCARDIOGRAM   REFERRING:  Dr. Milinda Cave and Dr. Eden Emms.   CLINICAL DATA:  A 55 year old woman with chest pain and hypertension.   Aorta 3.6, left atrium 4.1, septum 1.9, posterior wall 1.7, LV diastole  3.8, LV systole 2.8.   1. Technically suboptimal and limited echocardiographic study.  2. Very mild left atrial enlargement; right atrium is grossly normal.  3. Right ventricle not adequately imaged; there is a question of some      enlargement; wall thickness and function appear normal.  4. Normal diameter of the proximal ascending aorta; mild calcification      of the wall.  5. Normal aortic and mitral valve; mild mitral annular calcification.  6. Pulmonic valve, proximal pulmonary artery and tricuspid valve all      suboptimally imaged, but appear normal.  7. Inadequate imaging of the left ventricle; LVH, perhaps moderately      severe, appears to be present; no areas of akinesis nor dyskinesis      are noted; overall function appears normal.  8. Substantial epicardial fat is apparent, particularly at the cardiac      apex.  9. Normal IVC.  10.Normal abdominal aorta.      Gerrit Friends. Dietrich Pates, MD, [facility]  Electronically Signed     RMR/MEDQ  D:  08/16/2007  T:  08/17/2007  Job:  [id number]

## 2011-04-27 NOTE — Procedures (Signed)
NAME:  [name], [name]               ACCOUNT NO.:  [id number]   MEDICAL RECORD NO.:  [id number]          PATIENT TYPE:  OUT   LOCATION:  RESP                          FACILITY:  [facility]   PHYSICIAN:  Kofi A. Gerilyn Pilgrim, M.D. DATE OF BIRTH:  1956-07-22   DATE OF PROCEDURE:  01/08/2009  DATE OF DISCHARGE:                              EEG INTERPRETATION   REFERRING PHYSICIAN:  Jeoffrey Massed, MD.   INDICATION:  A 55 year old lady who presents with spell suspicious for  seizure activity.   MEDICATIONS:  Metoprolol, Lyrica, Zyrtec, Lipitor, imipramine,  oxybutynin, and aspirin.   ANALYSIS:  A 16-channel recording is conducted for 24 minutes.  There is  a well-formed posterior background activity, which attenuates with eye  opening.  The activity is at 10 Hz.  There is beta activity observed in  the frontal areas.  Awake and sleep architectures are observed.  The  patient did have prominent vertex sharp waves.  K-complexes and spindles  observed indicating stage II sleep.  Photic stimulation and  hyperventilation both carried out without significant changes in  background activity.  There is no focal or lateralized slowing.  There  is no epileptiform activity observed.   IMPRESSION:  This is a normal recording of awake and sleep states.  A  single recording does not rule out epileptic seizures.  If clinically  indicated, a sleep-deprived or prolonged recording may be useful.      Kofi A. Gerilyn Pilgrim, M.D.  Electronically Signed     KAD/MEDQ  D:  01/09/2009  T:  01/09/2009  Job:  [id number]

## 2011-04-27 NOTE — Consult Note (Signed)
NAME:  [name], [name]               ACCOUNT NO.:  [id number]   MEDICAL RECORD NO.:  [id number]          PATIENT TYPE:  INP   LOCATION:  A227                          FACILITY:  [facility]   PHYSICIAN:  Gerrit Friends. Dietrich Pates, MD, FACCDATE OF BIRTH:  May 10, 1956   DATE OF CONSULTATION:  08/16/2007  DATE OF DISCHARGE:                                 CONSULTATION   REFERRING PHYSICIAN:  Dr. Rito Ehrlich.   PRIMARY CARE PHYSICIAN:  Dr. Marvel Plan.   PRIMARY CARDIOLOGIST:  Previously Dr. Dorethea Clan   HISTORY OF PRESENT ILLNESS:  A 55 year old woman with multiple  cardiovascular risk factors but with no known cardiovascular disease  presents with chest discomfort.  [name] describes constant upper  left anterior chest discomfort of mild to moderate severity.  This has a  waxing and waning course, but never completely resolves.  It has not  prevented her from following her usual daily activities.  She performs  all of her own housework and walks daily, generally without problems.  She does notice slight increase in chest discomfort with walking, but  not with cleaning.  There is no relationship to movement of the upper  extremities or trunk.  She has some mild associated chest wall  tenderness.  There is a mild pleuritic component.  There have been no  associated symptoms.   [name] was evaluated for chest discomfort in late 2006.  She  underwent stress testing, that suggested the presence of ischemia;  however, coronary angiography was entirely normal. [name] has  hypertension and hyperlipidemia, both of which have been under good  medical control. She has chronic weight problems that have been stable.  She has not had diabetes nor has she used tobacco products.   PAST MEDICAL HISTORY:  Fairly extensive.  She has had both right and  left total knee arthroplasties.  She has a history of reactive airways  disease as well as obstructive sleep apnea for which she uses a positive  pressure device at  night.  She also may have periodic leg movements that  have not been treated.  She has had DJD of the spine with two back  surgeries.  She underwent a hysterectomy in 1988.  She was evaluated for  hematochezia in February 2007 and found to have diverticular disease.  The most recent lipid profile available to me is from 2006 at which time  total cholesterol was 195, triglycerides 162, LDL 117 and HDL 46.  There  is also a history of depression.   MEDICATIONS ON ADMISSION:  1. Oxybutynin ER 15 mg daily.  2. Citalopram 40 mg daily.  3. Atorvastatin 20 mg daily.  4. Imipramine 50 mg daily.  5. Metoprolol 50 mg daily.  6. Bupropion 150 mg b.i.d.  7. Over-the-counter preparation with quinine use for leg cramps      intermittently.   SOCIAL HISTORY:  Married and lives locally with her husband; unemployed;  no use of alcohol or illicit drugs.  Has two adult children.   FAMILY HISTORY:  Mother has cardiac disease and has undergone CABG  surgery.  Biological father is  unknown.  Four brothers, one has  congenital heart disease and has required multiple surgical procedures.   REVIEW OF SYSTEMS:  Notable for a peak weight of 380 pounds.  She  describes some general weakness and malaise.  She continues to have some  arthritic discomfort, but this does not require medication.  She reports  another orthopedic procedure, ankle surgery in 1984.  She notes some  intermittent mild pedal edema.  She follows a regular diet.  She has  stress incontinence.  She requires corrective lenses for both near and  far vision.  She had some loosening of her upper teeth.  She notes some  intermittent anxiety.  She describes a remote suicide attempt in 1975  with an overdose of pills.  All other systems reviewed and are negative.   PHYSICAL EXAMINATION:  A very pleasant, overweight woman in no acute  distress.  The blood pressure is 130/70, heart rate 80 and regular, respirations  16, afebrile.  HEENT:   Anicteric sclerae; normal lids and conjunctiva; EOMs full.  Normal oral mucosa.  NECK:  No jugular venous distention; normal carotid upstrokes without  bruits.  ENDOCRINE:  No thyromegaly.  HEMATOPOIETIC:  No adenopathy.  SKIN:  No significant lesions.  LUNGS:  Clear. Decreased breath sounds at the bases.  CARDIAC:  Normal first and second heart sounds; fourth heart sound  present.  ABDOMEN:  Soft and nontender; no masses; no organomegaly.  EXTREMITIES:  1+ edema; anterior surgical scars over both knees; normal  distal pulses.  NEUROMUSCULAR:  Symmetric strength and tone; normal cranial nerves.   EKG:  Sinus bradycardia; borderline first-degree AV block; low voltage;  otherwise unremarkable.   Cardiac markers are negative.  Metabolic profile and CBC are normal.  Lipid profile is pending.  Chest x-ray, borderline heart size;  atelectasis at the right base.   IMPRESSION:  [name] presents with prolonged atypical chest pain.  Symptoms of this duration rarely, if ever, represent ischemic heart  disease.  She does have some relationship to exertion, but this is in  constant.  Due to previous dramatically false positive stress nuclear  study, repeating such as a test would probably not prove useful.  In any  case, I do not believe that we need to rule out ischemic heart disease 2  years following negative coronary angiography with a relatively low risk  (1% per year) of a cardiac event based upon her risk factors alone.  She  is being treated with nonsteroidal anti-inflammatory agents, which is  appropriate. If symptoms persist, injection with steroids of a trigger  point could be considered.  I will not plan to see this nice woman  routinely but will be happy to become reinvolved with her care whenever  appropriate.  Thank so much for requesting my opinion.      Gerrit Friends. Dietrich Pates, MD, [facility]  Electronically Signed     RMR/MEDQ  D:  08/16/2007  T:  08/16/2007  Job:  [id number]

## 2011-04-27 NOTE — Discharge Summary (Signed)
NAME:  [name], [name]               ACCOUNT NO.:  [id number]   MEDICAL RECORD NO.:  [id number]          PATIENT TYPE:  INP   LOCATION:  A322                          FACILITY:  [facility]   PHYSICIAN:  Melissa L. Ladona Ridgel, MD  DATE OF BIRTH:  06/23/1956   DATE OF ADMISSION:  07/02/2009  DATE OF DISCHARGE:  07/26/2010LH                               DISCHARGE SUMMARY   [name] was admitted to [facility] on July 02, 2009.  She is  being discharged on July 07, 2009.   DISCHARGE DIAGNOSES:  1. Right foot ulcer.  2. Associated osteomyelitis of the fifth right metatarsal.  The rest      of her discharge diagnoses are associated with her past medical      history and include obstructive sleep apnea, hypertension,      hyperlipidemia, and restless legs syndrome.   CONSULTATIONS:  Dr. Romeo Apple from Orthopedic Surgery saw the patient on  July 03, 2009 and recommended IV antibiotics be continued long term.  He  also recommended foot soaks t.i.d.  He will follow her as an outpatient.   HISTORY AND BRIEF HOSPITAL COURSE:  [name] is a very pleasant 70-  year-old obese Caucasian female, approximately 1 month ago, she had a  traumatic injury and that an automated wheelchair fell on her right  foot.  This resulted in an open sore which she treated with Neosporin  and foot soaks.  After about 2 weeks, she felt that it was not healing  properly and she saw her primary care physician, Dr. Milinda Cave.  He placed  her on oral antibiotics.  A 2-week course of oral antibiotics did not  improve the area and he ordered an MRI, even the MRI showed  osteomyelitis.  She came to the emergency department at [facility] and was admitted.  On admission, she was started on IV  antibiotics including vancomycin and clindamycin.  She had venous  Dopplers that showed no occlusive venous disease in her lower  extremities bilaterally.  She was seen the day after admission by the  orthopedic surgeon, Dr.  Romeo Apple who felt that the patient would need a  long-term course of IV antibiotic therapy approximately 6 weeks.  A  peripherally inserted central catheter was ordered for long-term  antibiotic therapy.  It was placed.  The patient felt some pain from the  PICC line.  X-ray revealed that there may be trouble with placement.  The PICC was removed from her right arm on July 24.  The following day,  a central line was attempted to be placed; however, this was also  unsuccessful.  Today, July 26, a PICC line was successfully placed in  her left cephalic vein.  Placement was approved and confirmed by x-ray.  The patient is ready for discharge.   PHYSICAL EXAMINATION:  VITAL SIGNS:  As follows, temperature 98.1, pulse  71, respirations 20, blood pressure is 128/50.  GENERAL:  The patient is a 55 year old obese Caucasian female in good  spirits.  HEENT:  Her head is normocephalic, atraumatic.  Eyes are clear.  Conjunctivae are pink.  Pupils are equal, round, and reactive to light.  Nose shows no nasal discharge for external lesions.  Mouth shows no  external lesions.  Her mucous membranes are moist.  NECK:  No JVD,  lymphadenopathy.  Supple.  Trachea is midline.  LUNGS:  Respirations are clear to auscultation with no obvious wheezes  or crackles.  HEART:  Regular rate and rhythm with no murmurs, rubs, or gallops.  ABDOMEN:  Obese, but nontender, nondistended with good bowel sounds.  EXTREMITIES:  She does have multiple bruises on her left arm mostly from  blood draws and attempts at access placement.  Her right lower extremity  has decreased swelling now.  The ulcerated area over her fifth right  metatarsal appears slightly improved, it still has a crusted area over  it.  The dorsum of her foot is much less tender.  She is able to get up  and walk about the room without difficulty.  NEUROLOGIC:  She is not having any dizziness, headaches, or seizures.   LABORATORY DATA:  Today, her BMET was  normal with the exception of a  glucose of 125.  LFTs were within normal limits.  Serum albumin is 3.5.  White blood cell count was last taken on July 25, it was 4.3.  Cultures  were faxed to the hospital from Dr. Samul Dada office.  This included a  Gram stain that showed no wbc's, no organisms seen.   DISPOSITION:  The patient is in good condition and ready for discharge  to home.   DISCHARGE MEDICATIONS:  1. Clindamycin 450 mg 1 tablet p.o. four times a day.  2. Vancomycin 1250 mg IV q.12 h. per Home Health.  3. Metoprolol 50 mg 1 tablet twice daily.  4. Lyrica 50 mg 1 tablet three times daily.  5. Cetirizine 10 mg 1 tablet at bedtime.  6. Lipitor 20 mg 1 tablet at bedtime.  7. Imipramine 50 mg 1 tablet at bedtime.  8. Oxybutynin ER 15 mg 1 tablet at bedtime.  9. Aspirin 81 mg 1 tablet at bedtime.   DISCHARGE INSTRUCTIONS:  The patient has a followup appointment with Dr.  Romeo Apple on August 10.  She is to soak her right foot three times a day.  She is to call Dr. Mort Sawyers office earlier with any increase in pain,  drainage, or erythema on her right foot.   Time spent on this discharge summary was 30 minutes.    Addendum: I saw and examined this patient prior to discharge.  I have  reviewed the plan of care with the patient and with our Physicians  Assistant.  I concure with the physical exam,  and discharge  medications.  I have planned for a six week course of antibiotics  including Vancomycin IV and Clindamycin orally.  Melissa L. Ladona Ridgel, M.D.      Stephani Police, PA      Melissa L. Ladona Ridgel, MD  Electronically Signed    MLY/MEDQ  D:  07/07/2009  T:  07/08/2009  Job:  [id number]   cc:   Jeoffrey Massed, MD  Fax: [phone]

## 2011-04-30 NOTE — Op Note (Signed)
NAME:  [name], [name]               ACCOUNT NO.:  [id number]   MEDICAL RECORD NO.:  [id number]          PATIENT TYPE:  AMB   LOCATION:  DAY                           FACILITY:  [facility]   PHYSICIAN:  R. Roetta Sessions, M.D. DATE OF BIRTH:  [birth year]   DATE OF PROCEDURE:  02/09/2006  DATE OF DISCHARGE:                                 OPERATIVE REPORT   PROCEDURE:  Diagnostic colonoscopy.   INDICATIONS FOR PROCEDURE:  A 55 year old lady with a two-day history of  bloody diarrhea three weeks ago. Symptoms have resolved. She did take a  course of Cipro. There was no family history of inflammatory bowel disease  or colorectal cancer. She has never had her lower GI tract imaged. Her bowel  symptoms are now back to her baseline normal. Potential risks, benefits, and  alternatives have been reviewed and questions answered. She is agreeable.  Please see documentation in the medical record.   PROCEDURE NOTE:  O2 saturation, blood pressure, pulse, and respirations were  monitored throughout the entire procedure. Conscious sedation with Versed 3  mg IV and Demerol 75 mg IV in divided doses.   INSTRUMENT:  Olympus video chip system.   FINDINGS:  Digital rectal exam revealed no abnormalities.   ENDOSCOPIC FINDINGS:  Prep was adequate.   Rectum:  Examination of the rectal mucosa including retroflexed view of the  anal verge revealed no abnormalities.   Colon:  Colonic mucosa was surveyed from the rectosigmoid junction through  the left, transverse, and right colon to the area of the appendiceal  orifice, ileocecal valve, and cecum. These structures were well seen and  photographed for the record. From this level, the scope was slowly  withdrawn, and all previously mentioned mucosal surfaces were again seen.  The patient has a few shallow left sided diverticula. The remainder of the  colonic mucosa appeared normal. The patient tolerated the procedure well and  was reactive to endoscopy.   IMPRESSION:  Normal rectum. Few scattered swallow left sided diverticula.  The remainder of the colonic mucosa appeared normal.   I expect the patient had an enteric infection which has resolved.   RECOMMENDATIONS:  1.  Diverticulosis literature given to Ms. Direnzo.  2.  Repeat colonoscopy for screening purposes in 10 years.      Jonathon Bellows, M.D.  Electronically Signed     RMR/MEDQ  D:  02/09/2006  T:  02/09/2006  Job:  [id number]   cc:   Francoise Schaumann. Milford Cage DO, FAAP  Fax: [phone]

## 2011-04-30 NOTE — Consult Note (Signed)
NAME:  [name], [name] NO.:  [id number]   MEDICAL RECORD NO.:  [id number]          PATIENT TYPE:  INP   LOCATION:  A226                          FACILITY:  [facility]   PHYSICIAN:  Vida Roller, M.D.   DATE OF BIRTH:  25-Jul-1956   DATE OF CONSULTATION:  08/30/2005  DATE OF DISCHARGE:                                   CONSULTATION   REFERRING PHYSICIAN:  Vania Rea, M.D.   HISTORY OF PRESENT ILLNESS:  Ms. Berwanger is a 55 year old woman with no  significant past cardiac history who has risk factors of advanced family  history and hyperlipidemia untreated who presents with one episode of  discomfort in her chest.  She describes it as a sharp discomfort from the  center of her chest radiating to her left shoulder that awakened her from  sleep.  It lasted about 2 hours and was relieved by sublingual  nitroglycerin.  She has not had any recurrence of  the discomfort since  being in the hospital.  She denies any shortness of breath, no PND or  orthopnea, no lower extremity edema.  She describes no syncope or presyncope  and no palpitations are associated with this.   MEDICATIONS PRIOR TO ADMISSION:  1.  Imipramine 50 mg at bedtime.  2.  Ditropan XL 15 mg at bedtime.   PAST MEDICAL HISTORY:  1.  Morbid obesity.  2.  History of reactive airway disease which is not currently being treated.  3.  Severe degenerative joint disease in her lower extremities.  4.  Total knee arthroplasty in February 2006, in [place], without      complications.  5.  She has had two separate back surgeries, the most recent in 2000.  6.  Previous surgery on the knee prior to the replacement.  7.  Hysterectomy in 1988.   ALLERGIES:  No known drug allergies.   SOCIAL HISTORY:  She lives in [place].  She denies alcohol, tobacco or  illicit drugs.  She is married.  She has two children, both of whom are boys  in their 21s who are healthy.   FAMILY HISTORY:  Her mother had heart  disease and ended up with a bypass  surgery at age 30.  She does not know her biological father.  She has four  brothers, one of whom has congenital heart disease and has required several  surgeries and has a pacemaker.   REVIEW OF SYSTEMS:  CONSTITUTIONAL:  She denies any headaches or dizziness.  HEENT:  No visual changes, no hearing changes.  No problems with her  dentition.  No odynophagia or dysphagia.  No sore throat.  No fevers or  chills.  She has recently been losing quite a bit of weight.  She use to be  about 380 pounds and now, she is down below 300 pounds in the 280s.  GASTROINTESTINAL:  She denies any changes in her bowel habits, no melena,  hematochezia, hematemesis or hemoptysis.  CARDIOPULMONARY:  She denies any  cough, production of sputum or wheezing.  GASTROINTESTINAL:  She denies any  abdominal pain.  MUSCULOSKELETAL:  No problems with her lower extremities.  She does have the DJD with chronic discomfort.  INTEGUMENT:  She denies any  skin lesions.  No thyroid problems.  NEUROLOGIC:  No neurologic problems.  No psychiatric problems.  The remainder of her review of systems is  negative.   ALLERGIES:  No known drug allergies.   PHYSICAL EXAMINATION:  GENERAL:  She is an obese, white female in no  apparent distress who is alert and oriented x4.  She is a very good  historian.  She weighs 281 pounds.  VITAL SIGNS:  Blood pressure 119/62, pulse 65, respirations 20, afebrile.  HEENT:  Unremarkable.  NECK:  Supple and full.  There is no obvious jugular venous distention.  She  has no carotid bruits.  CHEST:  Clear to auscultation.  CARDIAC:  Regular rate, distant heart sounds.  No significant murmur is  noted.  S1, S2 normal.  Point of maximal impulse is not displaced.  GENITALIA:  Deferred.  BREASTS:  Deferred.  RECTAL:  Deferred.  ABDOMEN:  Obese, but soft, nontender with normoactive bowel sounds.  EXTREMITIES:  She has multiple surgical scars on the left leg.  There  is 1-  2+ edema.  There is 1-2+ pulses.  She does have some chronic venous stasis  changes seen likely from the multiple surgeries.  NEUROLOGIC:  Grossly nonfocal.   LABORATORY DATA AND X-RAY FINDINGS:  White blood cell count 4.5, H&H 13 and  38 with a platelet count of 197.  Sodium 135, potassium 3.9, chloride 107,  bicarb 24, BUN 12, creatinine 0.8 and her blood sugar is 105.  Her liver  function studies are all within normal limits.  She has three sets of  cardiac enzymes which are not consistent with acute myocardial infarction  with total CKs never over 100 and troponins are always at least 0.01 at the  most.  She also had point of care markers which were normal x2.  Her D-dimer  was 0.32 which is normal.  Her TSH is normal.  She had a blood gas which  showed a pH of 7.46, pCO2 32, pO2 86 with 98% saturation on room air.  Her  urinalysis was normal.  Her INR is 1.0, PTT 13.4.   She has not had a chest x-ray.  Her electrocardiogram shows sinus rhythm at  a rate of 71 with normal intervals and a normal axis.  No ischemic ST and T  wave changes.  There are no Q waves concerning for an old myocardial  infarction, essentially a normal electrocardiogram.   ASSESSMENT:  1.  Chest discomfort, atypical, minimal cardiac risk factors with the      exception of a significant family history.  Electrocardiogram and      cardiac enzymes are not consistent with an acute myocardial infarction      and the pain is very atypical for angina.  2.  Hyperlipidemia.  She is on no medication previously.  It is currently      treated with Zocor in the hospital.  3.  Morbid obesity.  She weighs in excess of 280 pounds, although she says      that she has been losing quite a bit of weight and has lost over 100      pounds in the last few months.  4.  Lower extremity degenerative joint disease is significant.  5.  History of reactive airway disease and is currently being treated with a     beta-blocker.  RECOMMENDATIONS:  It is my opinion that this lady can have an outpatient  adenosine Myoview.  We would recommend a 2-day study.  I agree with the  lipid lowering agents.  We probably should check a set of fasting lipids.  I would stop the beta-blocker that she is on because she has reactive airway  disease and her blood pressure and pulse are well-controlled and it will  also be difficult to treat her on an ongoing basis with a beta-blocker if  she does have reactive airway disease.  I would add aspirin to her therapy,  probably at this point 325 once a day.      Vida Roller, M.D.  Electronically Signed     JH/MEDQ  D:  08/30/2005  T:  08/30/2005  Job:  [id number]

## 2011-04-30 NOTE — Op Note (Signed)
NAME:  [name], [name]               ACCOUNT NO.:  [id number]   MEDICAL RECORD NO.:  [id number]          PATIENT TYPE:  INP   LOCATION:  5031                         FACILITY:  [facility]   PHYSICIAN:  Mark C. Ophelia Charter, M.D.    DATE OF BIRTH:  1955-12-30   DATE OF PROCEDURE:  DATE OF DISCHARGE:                                 OPERATIVE REPORT   PREOPERATIVE DIAGNOSES:  1.  Previous left high tibial osteotomy with retained hardware.  2.  Lateral ligament instability.  3.  Knee osteoarthritis.   POSTOPERATIVE DIAGNOSES:  1.  Previous left high tibial osteotomy with retained hardware.  2.  Lateral ligament instability.  3.  Knee osteoarthritis.   PROCEDURE:  Left high tibial osteotomy plate removal, total knee  arthroplasty.   SURGEON:  Mark C. Ophelia Charter, M.D.   ANESTHESIA:  General plus Marcaine skin local.   FIRST ASSISTANT:  Vanita Panda. Magnus Ivan, M.D.   SECOND ASSISTANT:  Lynford Citizen, R.N.F.A.   ESTIMATED BLOOD LOSS:  100 mL.   DRAINS:  1 Hemovac, lateral incision.   BRIEF HISTORY:  This 55 year old female had previous high tibial osteotomy  for medial compartment arthritis.  This was several years ago.  [place] She has developed lateral instability with 40 degrees of  opening laterally with stable medial collateral ligament.   DESCRIPTION OF PROCEDURE:  After induction anesthesia with the neckline  needed do to no peripheral veins or unsuccessful peripheral vein IV  placement the patient was taken to the operating room after induction of  general anesthesia, preoperative Ancef prophylaxis, proximal thigh  tourniquet application, standard prepping and draping with DuraPrep.  Usual  skin marker was used before Betadine vi-drape application and previous  stockinet and Coban.  The leg was wrapped with an esmarch and tourniquet was  inflated to 400.  Lateral incision was opened first. It appeared that the  anterior compartment muscles had been split directly  and there was some  heterotopic bone in the anterior compartment.  These pieces of bone  cartilage were removed. The plate was present immediately underneath it.  Two screws were removed; and the plate was removed with some difficulty.  Flexible osteotomes had to be used in order to come over the top of the  blade to loosen it. A slat hammer with a tiny hook, usually used for removal  of the cement plug and a femoral stem were visualized.  Moreland tray was  used to hook the plate, __________ it out with the slap __________.   Next, sponges were placed in that wound and attention was made at the  midline.  A standard medial parapatellar tendon prepatellar retinacular  incision was made cutting the quad tendon between the medial one-third and  lateral two-thirds.  The patella was flipped over, cut first.  There was  scar tissue laterally in the compartment, as expected.  The meniscus was  resected.  Serial retractor was placed and intramedullary hole was made in  the femur and distal femoral cut was made.  An extra 4 mm was taken.   Next,  the oscillating saw was used to remove the tibial spine and an  intramedullary hole was started down the tibia and it ran into the region  where the plate had been located.  The intramedullary rod was used and a P-  cut was made taking 10 mm on the tibia.  This cut came about 2 mm above the  previous blade plate.  Posterior meniscus was resected.  Anterior-posterior  champher cuts were made on the femur.  Plan was for a TC 3 Dupuy knee.  No  revision seams were planned; however, the extra deep box was necessary with  the rotating platform for the need for stability due to the lateral  collateral ligament instability.   Progressively from a 15-17.5 and then to a 20-mm inserts were used until  there was excellent stability.  Key-hole was made, revision keeled reamer.  Holes were drilled in the patella, pulsatile lavage was used.  __________  cut was made  on the femur and after pulsatile lavage, cement mixing and  drying of the bone the tibia was cemented first followed by cementing the  femur a tibial 20-mm insert and then a patellar piece.  There was good  stability on flexion-extension.  After 15 minutes the cement was hard.  Tourniquet was dropped.  Bleeders were controlled in the medial or midline  incision and osteal lateral incision.  There was a small vein off the  posterior tibia coming into the anterior compartment that was coagulated.   A Hemovac was placed in the lateral incision since it did connect with the  joint and as it had been opened and the plate was being removed; there was  joint fluid seen from the lateral ligamentous instability.  The fascia lata  was closed with #0 Vicryl, 2-0 Vicryl in the subcutaneous tissue and skin  staple closure.  The knee was closed with #1 Tycron, 2-0 Vicryl in the  subcutaneous tissue, skin staple closure, Xeroform 4 x 4's after Marcaine  infiltration into the skin, both incisions, a total of 30 mL.  Webril, Ace  wrap and knee immobilizers applied and the patient was transferred to the  recovery room in stable condition. Instrument count and needle count was  correct.  The patient had intact peroneal nerve function in the recovery  room.      MCY/MEDQ  D:  01/22/2005  T:  01/22/2005  Job:  [id number]

## 2011-04-30 NOTE — Discharge Summary (Signed)
NAME:  [name], [name] NO.:  [id number]   MEDICAL RECORD NO.:  [id number]          PATIENT TYPE:  INP   LOCATION:  A226                          FACILITY:  [facility]   PHYSICIAN:  Vania Rea, M.D. DATE OF BIRTH:  Oct 25, 1956   DATE OF ADMISSION:  08/29/2005  DATE OF DISCHARGE:  09/17/2006LH                                 DISCHARGE SUMMARY   PRIMARY CARE PHYSICIAN:  Nicoletta Ba, MD.   DISCHARGE DIAGNOSES:  1.  Chest pain, myocardial infarction ruled out.  2.  Chronic pain syndrome.  3.  Obesity.  4.  Disposition - discharged to home.   DISCHARGE CONDITION:  Stable.   DISCHARGE MEDICATIONS:  1.  Aspirin 81 mg daily.  2.  Protonix 40 mg daily.  3.  Lipitor 20 mg each evening.  4.  Imipramine 50 mg at bedtime.  5.  Ditropan XL 15 mg at bedtime.   HOSPITAL COURSE:  Please refer to admission History and Physical. This is a  55 year old, obese, Caucasian lady with a history of hyperlipidemia and a  strong family history of coronary artery disease who was admitted with a  history of left-sided chest pain radiating down her left arm, relieved by  nitroglycerin in the emergency room. The patient had serial cardiac enzymes  done overnight. They were negative for any evidence of myocardial injury.  She has had serial EKGs which show no evidence of myocardial ischemia. She  was evaluated by the cardiologist this morning and considered best evaluated  by an outpatient 2D cardiac stress testing.   Although patient had a history of hyperlipidemia she was not on any  antilipemic drugs. The patient was started on Lipitor and a fasting lipid  panel was drawn this morning. Results are pending.   PHYSICAL EXAMINATION:  VITAL SIGNS:  Rate 59, respirations 20, blood  pressure 154/84. Her height is recorded as 5 feet 7 inches, and her weight  is 281 lbs.  HEENT: Pupils are equal, round and reactive to light. Mucous membranes are  pink.  NECK:  She has a thick neck.  CHEST:  Clear to auscultation.  CARDIOVASCULAR:  Regular rhythm.  ABDOMEN:  Obese, soft and nontender.  EXTREMITIES:  Without edema.   LABORATORIES:  Her white count is 4.5, hemoglobin 13.4, hematocrit 38.6,  platelets 197. She had a normal differential on her white count. Her sodium  135, potassium 3.9, chloride 107, CO2 24, glucose 105, BUN 12, creatinine  0.8, calcium 8.5. Her liver function tests were all normal. Her cardiac  enzymes were completely negative with a CK persistently 93 and a troponin  persistently 0.01. Lipid panel is pending. Her TSH was normal at 2.78. Her  urinalysis was unremarkable.   FOLLOW UP:  Follow up with her primary care physician Dr. Nicoletta Ba.  Follow up with Dr. Vida Roller after outpatient cardiac stress testing.      Vania Rea, M.D.  Electronically Signed     LC/MEDQ  D:  08/30/2005  T:  08/30/2005  Job:  [id number]

## 2011-04-30 NOTE — Cardiovascular Report (Signed)
NAME:  [name], [name] NO.:  [id number]   MEDICAL RECORD NO.:  [id number]           PATIENT TYPE:   LOCATION:                                 FACILITY:   PHYSICIAN:  Vida Roller, M.D.   DATE OF BIRTH:  06/08/1956   DATE OF PROCEDURE:  09/30/2005  DATE OF DISCHARGE:                              CARDIAC CATHETERIZATION   PRIMARY CARE PHYSICIAN:  Dr. Santiago Bumpers   HISTORY OF PRESENT ILLNESS:  [name] is a 55 year old woman who has  morbid obesity and multiple cardiac risk factors who presents with  discomfort in her chest.  She had an abnormal perfusion scan at [facility] which had a reversible anterior defect which was most consistent  with breast attenuation.  However, there was some element of reversibility  to it and therefore after consideration and discussion we decided to proceed  with diagnostic heart catheterization.  She had had recurrent discomfort in  her chest which was atypical for coronary disease.   PROCEDURES PERFORMED:  1.  Left Heart Catheterization  2.  Selective Coronary Angiography  3.  Left Ventriculography   DETAILS OF THE PROCEDURE:  After obtaining informed consent and verifying  that there is a normal Allen's test on the right wrist, the patient was  brought to the cardiac catheterization laboratory in the fasting state.  There she was prepped and draped in the usual sterile manner and local  anesthetic was obtained over the right wrist using 1% lidocaine without  epinephrine.  The right radial artery was cannulated using the modified  Seldinger technique with a 5-French 10 cm sheath and left heart  catheterization was performed using a 5-French Judkins left #4, 5-French  Judkins right #4, and a 5-French pigtail catheter.  Pigtail catheter was  used for left ventriculography in the 30 degree RAO view.  At the conclusion  of the procedure the catheters were removed.  Hemostasis was obtained using  a Radstat device.   There was no evidence of ecchymosis or hematoma  formation.  Distal perfusion was verified using pulse oximetry in the thumb  and third digit.   RESULTS:  Aortic pressure 151/86, mean of 118 mmHg.   Left ventricular pressure 150/11 with an end-diastolic pressure of 18 mmHg.   CORONARY ANGIOGRAPHY:  The left main coronary artery is a moderate-caliber  normal vessel.   The left anterior descending coronary artery is a moderate-caliber vessel  with a single branching diagonal and is free of disease.   The circumflex coronary artery is a large vessel which has multiple  branches.  It is free of disease.   The right coronary artery is a moderate-caliber dominant vessel which has a  single posterior descending coronary artery and is free of disease.   Left ventriculogram reveals normal LV systolic function at 60% with no wall  motion abnormalities and no mitral regurgitation.   ASSESSMENT:  1.  False positive myocardial perfusion imaging study.  2.  Normal coronary arteries.  3.  Normal left ventricular systolic function.  4.  Systemic hypertension.   PLAN:  Aggressive  risk factor modification.  She is going to follow up with  me in three to four weeks in [place] with our P.A., Jae Dire.      Vida Roller, M.D.  Electronically Signed     JH/MEDQ  D:  09/30/2005  T:  09/30/2005  Job:  [id number]   cc:   Jeoffrey Massed, MD  Fax: [phone]

## 2011-04-30 NOTE — H&P (Signed)
NAME:  [name], [name] NO.:  [id number]   MEDICAL RECORD NO.:  [id number]          PATIENT TYPE:  INP   LOCATION:  A226                          FACILITY:  [facility]   PHYSICIAN:  Vania Rea, M.D. DATE OF BIRTH:  26-Apr-1956   DATE OF ADMISSION:  08/29/2005  DATE OF DISCHARGE:  LH                                HISTORY & PHYSICAL   PRIMARY CARE PHYSICIAN:  Dr. Maryjean Morn. McGowen.   CHIEF COMPLAINT:  Chest pain since last night.   HISTORY OF PRESENT ILLNESS:  This is a 55 year old, obese Caucasian lady  with a strong family history of coronary artery disease, who since last  night has been having severe 10/10, sharp, crushing central chest pain  radiating up to the left shoulder and down into the left arm.  She has no  personal history of heart disease. The patient said the pain came on last  night.  She did not have any medications to take.  She noticed it was  aggravated by deep breathing, associated with shortness of breath and  nausea.  No diaphoresis.  She settled down, and she was able to sleep  somewhat fitfully but woke this morning with a return of the severe 10/10  chest pressure and left arm numbness.  The patient came to the emergency  room, where she received sublingual nitroglycerin times three, which  promptly relieved the pain.  The hospitalist service was called for  admission.  The patient has no history of coronary artery disease.  Her  mother, who is diabetic, died from an acute heart attack at age 20.  The  father had coronary artery disease from in his 19s and had to go on  disability for that.  She does have a history of hyperlipidemia but is not  on any medication.   PAST MEDICAL HISTORY:  1.  Asthma.  2.  Irritable bladder.   PAST SURGICAL HISTORY:  Back surgery in 2000.  A hysterectomy in 1988.  Knee  surgery in February, 2006.   MEDICATIONS:  1.  Imipramine 50 mg at bedtime.  2.  Ditropan XL 15 mg at bedtime.   ALLERGIES:  No  known drug allergies.   SOCIAL HISTORY:  Married.  Denies tobacco, alcohol or illicit drug use.   FAMILY HISTORY:  As stated above.   REVIEW OF SYSTEMS:  Currently has a headache, but otherwise a 10-point  review of systems contributed nothing further.   PHYSICAL EXAMINATION:  GENERAL APPEARANCE:  An obese, middle aged Caucasian  lady, lying in bed, not in acute distress at this time.  VITAL SIGNS:  Temperature 97, pulse 70, respirations 24, blood pressure  105/65.  She is having no pain.  She is saturating at 98% on 2 L.  HEENT:  Her pupils are round, equal and reactive.  Mucous membranes are pink  and anicteric.  NECK:  She has a very thick, short neck.  Unable to assess for thyromegaly  or jugular venous distention.  CHEST:  Clear to auscultation bilaterally.  CARDIOVASCULAR:  Regular rhythm.  ABDOMEN:  Soft and nontender.  EXTREMITIES:  Without edema.  She has 2+ pulses bilaterally.   LABORATORY:  CBC is unremarkable with a white count of 3.8, hemoglobin of  13.4 and platelets of 220.  Her PT/PTT and INR are normal.  CBC and LFTs are  completely normal, potassium 4.  BUN and creatinine 14 and 0.8.  Total  bilirubin 0.4.  Her ABG is also completely normal with a pH of 7.46 on room  air with a pCO2 of 33, saturating at 98%.  Urinalysis is unremarkable. First  set of point of care enzymes are negative.   ASSESSMENT:  Chest pain, relieved with nitroglycerin, in a lady with a  history of untreated hyperlipidemia and a strong family history of heart  disease.  We will admit her to rule out myocardial injury and have a cardiac  assessment in the morning.  We will check her fasting lipid, her thyroid  function tests and monitor EKGs.      Vania Rea, M.D.  Electronically Signed     LC/MEDQ  D:  08/29/2005  T:  08/29/2005  Job:  [id number]

## 2011-04-30 NOTE — H&P (Signed)
NAME:  [name], [name]               ACCOUNT NO.:  [id number]   MEDICAL RECORD NO.:  [id number]          PATIENT TYPE:  EMS   LOCATION:  ED                            FACILITY:  [facility]   PHYSICIAN:  R. Roetta Sessions, M.D. DATE OF BIRTH:  08-17-1956   DATE OF ADMISSION:  01/31/2006  DATE OF DISCHARGE:  LH                                HISTORY & PHYSICAL   PRIMARY CARE PHYSICIAN:  Dr. Milford Cage   REASON FOR CONSULTATION:  Hematochezia.   HISTORY OF PRESENT ILLNESS:  Mrs. Axton is a 55 year old Caucasian female  who a couple of weeks ago developed two days of bloody diarrhea.  She notes  that this resolved quickly when she was started on Cipro 250 mg b.i.d. x7  days.  Several other members in her family became ill as well.  She noticed  within her stools she was having a moderate amount streaks of bright red  blood in her stool.  Since that time, she has had no further rectal  bleeding.  She denies any melena.  Denies any abdominal pain.  Denies any  further diarrhea.  She only has a bowel movement once every 2-3 days.  Occasionally, she has constipation and takes Milk-of-Magnesia about once a  month.  She denies any proctalgia or rectal pruritus.  She does have remote  history of hemorrhoidal disease, but this was during her pregnancy only.  She is on aspirin 81 mg daily.  Denies any other NSAID use.  She denies any  history of chronic GERD, heartburn, indigestion, dysphagia, odynophagia,  nausea, vomiting or early satiety.  She denies any anorexia.  She denies any  family history of colorectal carcinoma.   PAST MEDICAL HISTORY:  1.  Asthma.  2.  Hypertension.  3.  Hypercholesterolemia.  4.  Panic attacks.  5.  Anxiety.  6.  Chronic low back pain with previous disk surgery infusion to the lumbar      spine.  7.  Osteoarthritis.  8.  Sleep apnea on CPAP.  9.  Partial hysterectomy.  10. Left knee replacement in January 22, 2005.  11. Left ankle surgery with pin placement.   CURRENT  MEDICATIONS:  1.  Aspirin 81 mg daily.  2.  Imipramine HDL 50 mg daily.  3.  Lipitor 20 mg daily.  4.  Metoprolol 50 mg b.i.d.  5.  Ditropan XL 15 mg daily.  6.  CPAP at bedtime.   ALLERGIES:  PERCOCET.   FAMILY HISTORY:  There is no known family history of colorectal carcinoma,  liver or chronic GI problems.  Mother age 14 deceased secondary to MI.  She  is unsure of father's history.  Denies any siblings.   SOCIAL HISTORY:  Mrs. Cirelli is in her second marriage for the last 18  years.  She has two grown healthy children.  She is disabled secondary to  chronic low back pain.  There is a remote history of tobacco use.  Denies  any alcohol or drug use.   REVIEW OF SYSTEMS:  CONSTITUTIONAL:  Weight is stable.  Denies any fatigue.  Denies  any fever or chills.  CARDIOVASCULAR:  Denies any chest pain or  palpitations.  PULMONARY:  Denies any shortness of breath, dyspnea, cough or  hemoptysis.  GI:  See HPI.   PHYSICAL EXAMINATION:  VITAL SIGNS:  Weight 297 pounds, height 67 inches,  temperature 97.9, blood pressure 138/80, pulse 74.  GENERAL APPEARANCE:  Mrs. Zubiate is a morbidly obese, Caucasian female who  is alert, oriented, pleasant and cooperative in no acute distress.  HEENT:  Sclerae are clear, nonicteric.  Conjunctivae are pink.  She does  have a stitch on the right lower lid.  Oropharynx pink and moist without any  lesions.  NECK:  Supple without any mass or thyromegaly.  CHEST:  Heart regular rate and rhythm with normal S1, S2 without any  murmurs, clicks, rubs or gallops.  LUNGS:  Clear to auscultation bilaterally.  ABDOMEN:  Protuberant with positive bowel sounds x4.  No bruits auscultated.  Soft, mild tenderness to the suprapubic area on deep palpation.  No rebound  tenderness or guarding.  No hepatosplenomegaly or masses, although, exam is  limited given patient's body habitus.  EXTREMITIES:  With trace lower extremity edema bilaterally.  No clubbing.   IMPRESSION:   Mrs. Seher is a 55 year old obese, Caucasian female with a two  day history of bloody diarrhea a couple of weeks ago that was very self  limited.  She responded well to a seven day course of Cipro.  She has not  noticed any further rectal bleeding.  Given her age and her history of  intermittent hematochezia, she is going to need further evaluation to  determine the source of her rectal bleeding and rule out colorectal  carcinoma.  I suspect she may have benign bleeding from anorectal source  versus diverticular bleeding.   PLAN:  1.  Will schedule colonoscopy with Dr. Jena Gauss in the near future.  I have      discussed the procedure including risks and benefits to include but not      limited to bleeding, infection, perforation and drug reaction.  She      agrees with      plan and consent will be obtain.  She will stop her aspirin three days      prior to procedure.  2.  Further recommendations pending colonoscopy.   We would like to thank Dr. Milford Cage for allowing [place] to participate in the care  of Mrs. Wetsel.      Nicholas Lose, N.P.      Jonathon Bellows, M.D.  Electronically Signed    KC/MEDQ  D:  01/31/2006  T:  01/31/2006  Job:  [id number]   cc:   Francoise Schaumann. Milford Cage DO, FAAP  Fax: [phone]

## 2011-04-30 NOTE — Op Note (Signed)
NAME:  [name], [name]               ACCOUNT NO.:  [id number]   MEDICAL RECORD NO.:  [id number]          PATIENT TYPE:  INP   LOCATION:  5004                         FACILITY:  [facility]   PHYSICIAN:  Mark C. Ophelia Charter, M.D.    DATE OF BIRTH:  [birth year]   DATE OF PROCEDURE:  01/02/2007  DATE OF DISCHARGE:                               OPERATIVE REPORT   PREOPERATIVE DIAGNOSIS:  Right knee arthritis.   POSTOPERATIVE DIAGNOSIS:  Right knee arthritis.   PROCEDURE:  Right cemented total knee arthroplasty, computer assisted.   SURGEON:  Mark C. Ophelia Charter, M.D.   ASSISTANT:  Wende Neighbors, P.A.-C.   TOURNIQUET TIME:  1 hour 25 minutes.   ESTIMATED BLOOD LOSS:  Minimal.   DESCRIPTION OF PROCEDURE:  After induction of general anesthesia and  orotracheal intubation, preoperative femoral block done in the operating  room, a proximal thigh tourniquet was applied.  Standard DuraPrep used.  Usual total knee sheets, drapes, impervious stockinette.  Betadine Vi-  Drape x2 was needed to seal the skin due to the patient's large-size  leg.  A sterile skin marker was used prior to Vi-Drape.  A midline  incision was made.  The superficial retinaculum was cut.  The true  retinaculum was divided with a medial parapatellar skin incision.  The  patella was flipped over and cut from facet to facet.  Pins were placed  after resection of the meniscus and cruciate ligaments, 2 in the tibia  and 2 pins in the femur were placed inside the incision in the distal  femur, bicortical.  Initialization was performed with the center of the  hip, first the tibia, then followed by the femur.  Once this was  completed, the distal femoral cut was made, taking 9 mm of bone.  The  patient had 4 to 5 degrees of hyperextension, and also 3 degrees of  varus.  Then, 9 mm was resected off the femur, 8 mm off the tibia.  Chamfer cuts were made on the femur, box cuts.  A #4 sized on the femur.  A #3 sized on the tibia.  All cuts were  made with less than 1 degree of  variation from planned.  With 10 mm and still 3 degrees of  hyperextension, we were up to a 12.5, which gave 1 degree of  hyperextension and good varus, valgus and symmetrical extension and  flexion gaps within 1 mm at 90 degrees and at full extension.  There was  excellent coverage.  The cement was vacuum mixed.  The bursa was removed  off the posterior femur.  Presternal lavage was used.  The tibia was  cement first, followed by the femur and insertion of the spacer in the  patella, which was a 35 mm.  All excessive cement was removed.  The  cement was hardened in normal fashion.  The tourniquet was deflated.  Hemostasis was obtained and the standard closure with  nonabsorbable #1 in the deep retinaculum, 2-0 Vicryl in the subcutaneous  tissue and superficial retinaculum, and then skin closure.  Simple  sutures were placed on the  tibial pin sites distally.  Postop dressing,  knee immobilizer, and transfer to the recovery room.  Instrument count,  needle count were correct.      Mark C. Ophelia Charter, M.D.  Electronically Signed     MCY/MEDQ  D:  01/02/2007  T:  01/02/2007  Job:  [id number]

## 2011-04-30 NOTE — Discharge Summary (Signed)
NAME:  [name], [name]               ACCOUNT NO.:  [id number]   MEDICAL RECORD NO.:  [id number]          PATIENT TYPE:  INP   LOCATION:  5004                         FACILITY:  [facility]   PHYSICIAN:  Mark C. Ophelia Charter, M.D.    DATE OF BIRTH:  25-Aug-1956   DATE OF ADMISSION:  01/02/2007  DATE OF DISCHARGE:  01/06/2007                               DISCHARGE SUMMARY   ADMISSION DIAGNOSES:  1. Right knee osteoarthritis.  2. Sleep apnea treated with CPAP.  3. Asthma.  4. Hypertension.  5. Hypercholesterolemia.  6. Urinary incontinence.  7. Status post left total knee arthroplasty.  8. History of lumbar fusion.   DISCHARGE DIAGNOSES:  1. Right knee osteoarthritis.  2. Sleep apnea treated with CPAP.  3. Asthma.  4. Hypertension.  5. Hypercholesterolemia.  6. Urinary incontinence.  7. Status post left total knee arthroplasty.  8. History of lumbar fusion.  9. Posthemorrhagic anemia.   PROCEDURE:  On 01/02/2007:  The patient underwent right cemented total  knee arthroplasty, computer-assisted, by Dr. Ophelia Charter, assisted by Maud Deed under general anesthesia as well as a femoral block.   CONSULTATIONS:  None.   BRIEF HISTORY:  The patient is a 55 year old female with chronic  progressive right knee pain secondary to osteoarthritis.  She has  undergone a left total knee arthroplasty in 2005 and has done quite well  following the procedure.  She has failed conservative treatment to give  her relief of her symptoms of the right knee and now has significant  debilitation secondary to the right knee pain.  It was felt that she  would benefit from a right total knee arthroplasty and was admitted for  the procedure as stated above.   BRIEF HOSPITAL COURSE:  The patient tolerated the procedure under  general anesthesia without complications.  Postoperatively, she was  placed on PCA analgesics and weaned to oral analgesics without  difficulty.  She was placed on Coumadin for DVT prophylaxis  with  adjustments in Coumadin dose made according to daily protimes by the  pharmacist at [facility].  The patient required assistance by the  Respiratory Therapy Department for her CPAP as well as oxygen titration  postoperatively.  Prior to discharge, she was stable with independent  use of her CPAP.  The patient to receive physical therapy for ambulation  and gait training.  Initially, she was very slow with activity level.  She did, however, proceed to do somewhat better with her therapy and did  not desire short term nursing facility as she did have assistance at  home with her family.  Prior to discharge, she was ambulating 60 feet  with her walker and she was weightbearing as tolerated.  Dressing  changes were done daily and her wound was found to be healing without  drainage, erythema or edema.  The patient utilized a CPM machine  postoperatively for range of motion and also was taught active range of  motion by the physical therapist.  Occupational Therapy helped with ADLs  and the patient did well with this.  Foley catheter discontinued and the  patient  was able to void without difficulty.  She was on stool softeners  and laxatives for her bowel movements and did not develop significant  constipation.  She was able to take a regular diet by discharge.  At the  time of discharge, on 01/06/2007, the patient was afebrile with vital  signs stable.   PERTINENT LABORATORY DATA ON ADMISSION:  Hemoglobin 15.1, hematocrit  44.0.  The patient's remaining values were within normal limits.  At  discharge, hemoglobin 11.4, hematocrit 33.1.  INR at discharge 2.3.  The  patient did have an episode of potassium as high as 5.2.  She had been  receiving potassium in her IV fluids and this was discontinued.  Glucose  ranged from 104-130 and remainder of chemistry studies were within  normal limits.  Urinalysis on 12/30/2006 was negative for urinary tract  infection.  EKG on admission normal  sinus rhythm with no previous EKG  for comparisons confirmed by Dr. Eden Emms.   PLAN:  The patient was discharged to home.  Arrangements made for home  health physical therapy, occupational therapy, Coumadin management and  durable medical equipment made before discharge.  She was given prescriptions for Colace 101 mg b.i.d., Tylox one to two  every 4-6 hours as needed for pain, Coumadin 1 mg daily.  She was  advised to follow up with Dr. Ophelia Charter 1 week from surgery.  She will  continue weightbearing as tolerate utilizing a walker for ambulation.  She will continue to use her knee immobilizer until she is able to do a  straight leg raise and has good quad control.  She will resume a regular  diet.  The patient advised to call the office should there be questions  or concerns prior to her return office visit.      Wende Neighbors, P.A.      Mark C. Ophelia Charter, M.D.  Electronically Signed    SMV/MEDQ  D:  02/15/2007  T:  02/15/2007  Job:  [id number]

## 2011-04-30 NOTE — Procedures (Signed)
NAME:  [name], [name]               ACCOUNT NO.:  [id number]   MEDICAL RECORD NO.:  [id number]          PATIENT TYPE:  OUT   LOCATION:  SLEEP LAB                     FACILITY:  [facility]   PHYSICIAN:  Marcelyn Bruins, M.D. [facility] DATE OF BIRTH:  Apr 06, 1956   DATE OF STUDY:  11/29/2005                              NOCTURNAL POLYSOMNOGRAM   REFERRING PHYSICIAN:  Dr. Nicoletta Ba.   DATE OF STUDY:  November 29, 2005.   INDICATION FOR STUDY:  Persistent disorder of initiating and maintaining  sleep.   EPWORTH SCORE:  15.   SLEEP ARCHITECTURE:  The patient had total sleep time of 326 minutes with  adequate slow wave sleep as well as REM. Sleep onset latency was mildly  prolonged at 32 minutes and REM onset was normal. Sleep efficiency was  mildly decreased 88%.   RESPIRATORY DATA:  The patient was found to have 75 obstructive events in  the first 127 minutes of sleep. This gave her extrapolated respiratory  disturbance index of 35 events per hour. The events were not positional but  there was moderate to loud snoring noted. By protocol, the patient was  placed on a medium comfort full face mask and C-PAP titration was initiated.  At a final pressure of 6 cm, the patient had fairly good control of her  obstructive events; however, there were a few breakthrough events during her  final REM. Therefore I would recommend a treatment pressure of 7-8 cm of  water.   OXYGEN DATA:  There was O2 desaturation as low as 83% associated with her  obstructive events.   CARDIAC DATA:  No clinically significant cardiac arrhythmias.   MOVEMENT/PARASOMNIA:  The patient was found to have large numbers of leg  jerks with very little sleep disruption.   IMPRESSION/RECOMMENDATIONS:  1.  Moderate obstructive sleep apnea with a respiratory disturbance index of      35 events per hour and O2 desaturation as low as 83%. There was fairly      good control of at a C-PAP pressure of 6 during the study; however, I    would recommend a treatment pressure of 7-8 cm of water secondary to      breakthrough events during the final REM. A medium comfort full face      mask was used for the titration.  2.  Large numbers of leg jerks with very little sleep disruption. This may      simply be a manifestation of the      patient's sleep apnea. If she continues to have symptoms of sleep      disruption despite adequate C-PAP treatment, I would consider the      diagnosis of the restless leg syndrome or periodic leg movement      syndrome.                                            ______________________________  Marcelyn Bruins, M.D. [facility]  Diplomate, American Board of Sleep  Medicine  KC/MEDQ  D:  12/10/2005 12:40:16  T:  12/11/2005 06:13:00  Job:  [id number]

## 2011-04-30 NOTE — Discharge Summary (Signed)
NAME:  [name], [name]               ACCOUNT NO.:  [id number]   MEDICAL RECORD NO.:  [id number]          PATIENT TYPE:  INP   LOCATION:  5031                         FACILITY:  [facility]   PHYSICIAN:  Mark C. Ophelia Charter, M.D.    DATE OF BIRTH:  05/17/1956   DATE OF ADMISSION:  01/22/2005  DATE OF DISCHARGE:  01/26/2005                                 DISCHARGE SUMMARY   FINAL DIAGNOSES:  1.  Left knee instability.  2.  Osteoarthritis, previous.  3.  High tibia osteotomy with plating.  4.  Lateral-collateral ligament instability.   ADDITIONAL DIAGNOSES:  1.  Obesity.  2.  Asthma.   HISTORY OF PRESENT ILLNESS:  This 55 year old female had previous left knee  surgery in 2002 with a lateral tibia osteotomy with plating.  She has gross  lateral instability and opens greater than 45 degrees, which bothers her  even on flat ground.   ADMISSION MEDICATIONS:  1.  Ditropan-XL 15 mg one q.h.s.  2.  Tofranil 50 mg one p.o. q.h.s.   ROUTINE ADMISSION LABS:  Hemoglobin of 15.2, white count 5400, normal PT-  PTT, differential was normal.  Chemistry panel was normal as was the  urinalysis.  Chest x-ray showed no active disease.   HOSPITAL COURSE:  The patient was admitted and underwent hardware removal of  high tibia osteotomy, lateral fixation, and then a left cemented total knee  arthroplasty with Dr. Allie Bossier assisting.  EBL was 100 mL.  Tourniquet  time 1 hour, 40 minutes x400.  Central line had to be placed due to lack of  IV access.  She had a DePuy PFC knee revision cemented tray 2.5.  Femur was  a size 3.  TC3 insert, 20-mm height, and Smart-Set cement.  Three peg 32 mm  all-poly patella.  Postop, the patient had some low urine output secondary  to hypovolemia.  Potassium was 5.3.  The potassium was removed from her IV.  Hemoglobin was 11.9.  She was seen by PT and OT services for total knee  arthroplasty protocol.  CPM usage, pharmacy DVT prophylaxis.  On January 24, 2005, INR was  1.4, hemoglobin 10.4.  Hemovac pulled.  Potassium was back  to normal.  She made rapid progress with ambulation.  She  was discharged on the 14th on Tylox for pain, iron 300 mg b.i.d. x1 month.  On hospital followup on February 05, 2005, incision was dry, and she was  ambulatory and released by Physical Therapy after doing stairs.   CONDITION ON DISCHARGE:  Improved.      MCY/MEDQ  D:  03/28/2005  T:  03/28/2005  Job:  [id number]

## 2011-04-30 NOTE — Procedures (Signed)
NAME:  [name], [name] NO.:  [id number]   MEDICAL RECORD NO.:  [id number]          PATIENT TYPE:  REC   LOCATION:  RAD                           FACILITY:  [facility]   PHYSICIAN:  Vida Roller, M.D.   DATE OF BIRTH:  [birth year]   DATE OF PROCEDURE:  09/07/2005  DATE OF DISCHARGE:                                    STRESS TEST   HISTORY:  [name] is a 55 year old lady who was recently admitted to  [facility] on September 17 for chest discomfort. She was ruled out  for acute myocardial infarction with serial enzymes and scheduled for an  outpatient stress test for further evaluation. She has no history of  coronary artery disease. Cardiac risk factors include hyperlipidemia.   BASELINE DATA:  Electrocardiogram reveals sinus rhythm at 70 beats per  minute, nonspecific ST abnormalities, poor R-wave progression. Blood  pressure is 132/82.   Sixty milligrams of adenosine was infused over a 4-minute protocol. Myoview  was injected at 3 minutes. Patient reported neck and throat discomfort and  increased headache that resolved in recovery. Electrocardiogram revealed no  arrhythmias, no ischemic changes were noted.   Final images and results are pending MD review. Of note, this is a 2-day  test and rest images will be performed tomorrow.      Jae Dire, P.A. [facility]      Vida Roller, M.D.  Electronically Signed    AB/MEDQ  D:  09/07/2005  T:  09/07/2005  Job:  [id number]

## 2011-09-02 LAB — CBC
Hemoglobin: 13.7
RBC: 4.68
RDW: 13

## 2011-09-02 LAB — RAPID URINE DRUG SCREEN, HOSP PERFORMED
Barbiturates: NOT DETECTED
Benzodiazepines: NOT DETECTED
Cocaine: NOT DETECTED

## 2011-09-02 LAB — BASIC METABOLIC PANEL
Calcium: 9
GFR calc Af Amer: >60
GFR calc non Af Amer: >60
Glucose, Bld: 99
Sodium: 138

## 2011-09-02 LAB — DIFFERENTIAL
Basophils Absolute: 0
Lymphocytes Relative: 30
Monocytes Absolute: 0.4
Neutro Abs: 2.7

## 2011-09-02 LAB — ETHANOL: Alcohol, Ethyl (B): <5

## 2011-09-13 LAB — CBC
HCT: 44.1
Hemoglobin: 14.7
RDW: 13.3

## 2011-09-13 LAB — COMPREHENSIVE METABOLIC PANEL
Alkaline Phosphatase: 92
BUN: 11
Chloride: 108
GFR calc non Af Amer: >60
Glucose, Bld: 117 — ABNORMAL HIGH
Potassium: 4.1
Total Bilirubin: 0.9
Total Protein: 6.8

## 2011-09-13 LAB — POCT CARDIAC MARKERS: Myoglobin, poc: 68.9

## 2011-09-13 LAB — D-DIMER, QUANTITATIVE: D-Dimer, Quant: 0.44

## 2011-09-23 LAB — CBC
HCT: 41.1
Hemoglobin: 13.9
MCHC: 33.8
RDW: 13.4

## 2011-09-23 LAB — BASIC METABOLIC PANEL
CO2: 27
Chloride: 106
Glucose, Bld: 102 — ABNORMAL HIGH
Potassium: 4.2
Sodium: 138

## 2011-09-23 LAB — DIFFERENTIAL
Basophils Absolute: 0
Basophils Relative: 1
Eosinophils Relative: 3
Lymphocytes Relative: 33
Monocytes Absolute: 0.4
Monocytes Relative: 9

## 2011-09-24 LAB — CBC
Hemoglobin: 13
MCHC: 34
RBC: 4.39
WBC: 4.2

## 2011-09-24 LAB — CARDIAC PANEL(CRET KIN+CKTOT+MB+TROPI)
CK, MB: 1.9
CK, MB: 2.5
Relative Index: 1.7
Total CK: 110
Troponin I: 0.02

## 2011-09-24 LAB — LIPID PANEL
Cholesterol: 113
HDL: 36 — ABNORMAL LOW

## 2011-09-24 LAB — DIFFERENTIAL
Basophils Absolute: 0
Basophils Relative: 1
Eosinophils Absolute: 0.1
Eosinophils Relative: 2
Lymphs Abs: 1.4
Neutrophils Relative %: 52

## 2011-09-24 LAB — PROTIME-INR
INR: 1
Prothrombin Time: 13.4

## 2011-09-24 LAB — COMPREHENSIVE METABOLIC PANEL
ALT: 23
AST: 26
Alkaline Phosphatase: 82
CO2: 28
Calcium: 8.8
Chloride: 105
GFR calc non Af Amer: >60
Glucose, Bld: 88
Sodium: 137
Total Bilirubin: 0.8

## 2011-09-24 LAB — TSH: TSH: 2.65

## 2011-11-21 ENCOUNTER — Encounter: Payer: Self-pay | Admitting: *Deleted

## 2011-11-21 ENCOUNTER — Emergency Department (HOSPITAL_COMMUNITY)
Admission: EM | Admit: 2011-11-21 | Discharge: 2011-11-21 | Disposition: A | Discharge status: Home / Self Care | Payer: Medicare Other | Attending: Emergency Medicine | Admitting: Emergency Medicine

## 2011-11-21 ENCOUNTER — Other Ambulatory Visit: Payer: Self-pay

## 2011-11-21 ENCOUNTER — Emergency Department (HOSPITAL_COMMUNITY): Payer: Medicare Other

## 2011-11-21 DIAGNOSIS — J111 Influenza due to unidentified influenza virus with other respiratory manifestations: Secondary | ICD-10-CM | POA: Insufficient documentation

## 2011-11-21 DIAGNOSIS — R0602 Shortness of breath: Secondary | ICD-10-CM | POA: Insufficient documentation

## 2011-11-21 DIAGNOSIS — I10 Essential (primary) hypertension: Secondary | ICD-10-CM | POA: Insufficient documentation

## 2011-11-21 DIAGNOSIS — J45909 Unspecified asthma, uncomplicated: Secondary | ICD-10-CM | POA: Insufficient documentation

## 2011-11-21 DIAGNOSIS — E119 Type 2 diabetes mellitus without complications: Secondary | ICD-10-CM | POA: Insufficient documentation

## 2011-11-21 MED ORDER — ALBUTEROL SULFATE HFA 108 (90 BASE) MCG/ACT IN AERS: 1.0000 | INHALATION_SPRAY | Freq: Four times a day (QID) | RESPIRATORY_TRACT | Status: DC | PRN

## 2011-11-21 NOTE — ED Provider Notes (Signed)
History    This chart was scribed for Chloe Jakes, MD found by Magnus Sinning. The patient was seen in room APA09/APA09   CSN: [id number] Arrival date & time: 11/21/2011  7:41 AM   First MD Initiated Contact with Patient 11/21/11 [phone]      Chief Complaint  Patient presents with  . Shortness of Breath    (Consider location/radiation/quality/duration/timing/severity/associated sxs/prior treatment) HPI Pt seen at 8:46 AM   [name] is a 56 y.o. female who presents to the Emergency Department  complaining of  1 day of sudden onset SOB with associated dry cough, congestion, chest tightness, chills, diarrhea x 10 episodes yesterday (none today), and diaphoresis. Pt reports that she is not experiencing any headache, wheezes, rash, dysuria, hematuria, nausea, vomiting, or myalgias above baseline. Pt treated SOB with inhaler at home with improvement. Pt has a hx of fibromyalgia.  Past Medical History  Diagnosis Date  . Diabetes mellitus   . Hypertension   . Asthma     Past Surgical History  Procedure Date  . Abdominal hysterectomy   . Knee replacements     bilateral  . Back surgery   . Tonsillectomy     History reviewed. No pertinent family history.  History  Substance Use Topics  . Smoking status: Former Games developer  . Smokeless tobacco: Not on file  . Alcohol Use: No    Review of Systems  Constitutional: Positive for fever, chills and diaphoresis.  HENT: Positive for congestion.   Respiratory: Positive for cough. Negative for wheezing.        No phlegm with cough  Gastrointestinal: Positive for diarrhea. Negative for nausea and vomiting.  Genitourinary: Negative for dysuria and hematuria.  Skin: Negative for rash.  All other systems reviewed and are negative.    Allergies  Codeine  Home Medications   Current Outpatient Rx  Name Route Sig Dispense Refill  . ALBUTEROL SULFATE HFA 108 (90 BASE) MCG/ACT IN AERS Inhalation Inhale 2 puffs into the lungs every  4 (four) hours as needed. Shortness of Breath     . FESOTERODINE FUMARATE ER 8 MG PO TB24 Oral Take 8 mg by mouth daily.      . IMIPRAMINE HCL 50 MG PO TABS Oral Take 50 mg by mouth at bedtime.      Marland Kitchen METOPROLOL TARTRATE 50 MG PO TABS Oral Take 50 mg by mouth 2 (two) times daily.      Marland Kitchen PREGABALIN 100 MG PO CAPS Oral Take 100 mg by mouth 3 (three) times daily.        BP 144/94  Pulse 68  Temp(Src) 97.6 F (36.4 C) (Oral)  Resp 24  Ht 5\' 7"  (1.702 m)  Wt 281 lb (127.461 kg)  BMI 44.01 kg/m2  SpO2 97%  Physical Exam  Nursing note and vitals reviewed. Constitutional: She is oriented to person, place, and time. She appears well-developed and well-nourished. No distress.  HENT:  Mouth/Throat: Oropharynx is clear and moist and mucous membranes are normal. No oropharyngeal exudate.  Eyes: Conjunctivae and EOM are normal.  Neck: Normal range of motion. Neck supple.  Cardiovascular: Normal rate, regular rhythm and normal heart sounds.   Pulmonary/Chest: Effort normal and breath sounds normal. She has no wheezes.  Abdominal: Soft. Bowel sounds are normal. There is no tenderness.  Musculoskeletal: Normal range of motion. She exhibits no edema.  Neurological: She is alert and oriented to person, place, and time. No cranial nerve deficit. Coordination normal.  Skin: Skin is  warm and dry.  Psychiatric: She has a normal mood and affect.    ED Course  Procedures (including critical care time)  DIAGNOSTIC STUDIES: Oxygen Saturation is 97% on room air, normal by my interpretation.    COORDINATION OF CARE:  Dg Chest 2 View  11/21/2011  *RADIOLOGY REPORT*  Clinical Data: Short of breath  CHEST - 2 VIEW  Comparison: 02/09/2011  Findings: Normal heart size.  Clear lungs.  No pneumothorax.  No pleural fluid.  IMPRESSION: No active cardiopulmonary disease.  Original Report Authenticated By: Donavan Burnet, M.D.   Date: 11/21/2011  Rate: 71  Rhythm: normal sinus rhythm  QRS Axis: normal    Intervals: normal  ST/T Wave abnormalities: normal  Conduction Disutrbances:none  Narrative Interpretation:  Old EKG Reviewed: none available  ED DISCHARGE MEDICATIONS New Prescriptions   ALBUTEROL (PROVENTIL HFA;VENTOLIN HFA) 108 (90 BASE) MCG/ACT INHALER    Inhale 1-2 puffs into the lungs every 6 (six) hours as needed for wheezing.     1. Influenza     MDM  Patient stable in the emergency department no acute distress nontoxic room air sats 97-94% no respiratory distress no wheezing chest x-ray negative for pneumonia. Patient's symptom complex is consistent with a pursestring infection or influenza. Will continue albuterol inhaler which she started been using. Followup with her primary care doctor or emergency department if not better in 2 days or improving or if worse.      I personally performed the services described in this documentation, which was scribed in my presence. The recorded information has been reviewed and considered.          Chloe Jakes, MD 11/21/11 [phone]

## 2011-11-21 NOTE — ED Notes (Signed)
Pt c/o shortness of breath, chills, cough, congestion and chest tightness since yesterday. Also c/o diarrhea yesterday. Denies fever, nausea or vomiting.

## 2012-03-03 ENCOUNTER — Encounter (HOSPITAL_COMMUNITY): Payer: Self-pay | Admitting: Emergency Medicine

## 2012-03-03 ENCOUNTER — Emergency Department (HOSPITAL_COMMUNITY)
Admission: EM | Admit: 2012-03-03 | Discharge: 2012-03-03 | Disposition: A | Discharge status: Home / Self Care | Payer: Medicare Other | Attending: Emergency Medicine | Admitting: Emergency Medicine

## 2012-03-03 DIAGNOSIS — J45909 Unspecified asthma, uncomplicated: Secondary | ICD-10-CM | POA: Insufficient documentation

## 2012-03-03 DIAGNOSIS — T7840XA Allergy, unspecified, initial encounter: Secondary | ICD-10-CM | POA: Insufficient documentation

## 2012-03-03 DIAGNOSIS — E119 Type 2 diabetes mellitus without complications: Secondary | ICD-10-CM | POA: Insufficient documentation

## 2012-03-03 DIAGNOSIS — R21 Rash and other nonspecific skin eruption: Secondary | ICD-10-CM | POA: Insufficient documentation

## 2012-03-03 DIAGNOSIS — I10 Essential (primary) hypertension: Secondary | ICD-10-CM | POA: Insufficient documentation

## 2012-03-03 DIAGNOSIS — Z87891 Personal history of nicotine dependence: Secondary | ICD-10-CM | POA: Insufficient documentation

## 2012-03-03 MED ORDER — DIPHENHYDRAMINE HCL 25 MG PO CAPS
25.0000 mg | ORAL_CAPSULE | Freq: Once | ORAL | Status: AC
  Administered 2012-03-03: 25 mg via ORAL
  Filled 2012-03-03: qty 1

## 2012-03-03 MED ORDER — PREDNISONE 20 MG PO TABS
40.0000 mg | ORAL_TABLET | Freq: Once | ORAL | Status: AC
  Administered 2012-03-03: 40 mg via ORAL
  Filled 2012-03-03: qty 2

## 2012-03-03 MED ORDER — FAMOTIDINE 20 MG PO TABS
20.0000 mg | ORAL_TABLET | Freq: Once | ORAL | Status: AC
  Administered 2012-03-03: 20 mg via ORAL
  Filled 2012-03-03: qty 1

## 2012-03-03 MED ORDER — PREDNISONE 10 MG PO TABS: ORAL_TABLET | ORAL | Status: DC

## 2012-03-03 NOTE — ED Notes (Signed)
Pt reports red blotchy rash started on chest yesterday and progressing throughout her trunk and on proximal limbs. Pt denies itching, pain and general discomfort. Pt denies changing laundry powders, toiletries, and meds.Marland Kitchen

## 2012-03-03 NOTE — ED Notes (Signed)
Red rash started yesterday morning. Pt states rash has spread generalized to body. Pt denies itching or change in detergent or soap. No swelling noted.

## 2012-03-03 NOTE — Discharge Instructions (Signed)
Allergic Reaction  Allergic reactions can be caused by anything your body is sensitive to. Your body may be sensitive to food, medicines, molds, pollens, cockroaches, dust mites, pets, insect stings, and other things around you. An allergic reaction may cause puffiness (swelling), itching, sneezing, coughing, or problems breathing.   Allergies cannot be cured, but they can be controlled with medicine. Some allergies happen only at certain times of the year. Try to stay away from what causes your reaction if possible. Sometimes, it is hard to tell what causes your reaction.  HOME CARE  If you have a rash or red patches (hives) on your skin:   Take medicines as told by your doctor.   Do not drive or drink alcohol after taking medicines. They can make you sleepy.   Put cold cloths on your skin. Take baths in cool water. This will help your itching. Do not take hot baths or showers. Heat will make the itching worse.   If your allergies get worse, your doctor might give you other medicines. Talk to your doctor if problems continue.  GET HELP RIGHT AWAY IF:    You have trouble breathing.   You have a tight feeling in your chest or throat.   Your mouth gets puffy (swollen).   You have red, itchy patches on your skin (hives) that get worse.   You have itching all over your body.  MAKE SURE YOU:    Understand these instructions.   Will watch your condition.   Will get help right away if you are not doing well or get worse.  Document Released: 11/17/2009 Document Revised: 11/18/2011 Document Reviewed: 11/17/2009  [employer] Patient Information 2012 [employer].

## 2012-03-04 NOTE — ED Provider Notes (Signed)
History     CSN: [id number]  Arrival date & time 03/03/12  1018   First MD Initiated Contact with Patient 03/03/12 1101      Chief Complaint  Patient presents with  . Rash    (Consider location/radiation/quality/duration/timing/severity/associated sxs/prior treatment) HPI Comments: Patient c/o generalized rash that began on the day prior to ED arrival.  She states the rash is mostly non-itching.  She denies recent illness, fever, pustules, or blisters.  Patient is a 56 y.o. female presenting with rash. The history is provided by the patient. No language interpreter was used.  Rash  This is a new problem. The current episode started yesterday. The problem has not changed since onset.The problem is associated with an unknown factor. There has been no fever. The rash is present on the torso. The pain is at a severity of 10/10. The patient is experiencing no pain. Pertinent negatives include no blisters, no itching, no pain and no weeping. She has tried nothing for the symptoms. The treatment provided no relief.    Past Medical History  Diagnosis Date  . Diabetes mellitus   . Hypertension   . Asthma     Past Surgical History  Procedure Date  . Abdominal hysterectomy   . Knee replacements     bilateral  . Back surgery   . Tonsillectomy     History reviewed. No pertinent family history.  History  Substance Use Topics  . Smoking status: Former Games developer  . Smokeless tobacco: Not on file  . Alcohol Use: No    OB History    Grav Para Term Preterm Abortions TAB SAB Ect Mult Living                  Review of Systems  Constitutional: Negative for fever and chills.  HENT: Negative for neck pain and neck stiffness.   Musculoskeletal: Negative.   Skin: Positive for rash. Negative for itching.  Neurological: Negative for headaches.  All other systems reviewed and are negative.    Allergies  Codeine  Home Medications   Current Outpatient Rx  Name Route Sig Dispense  Refill  . ALBUTEROL SULFATE HFA 108 (90 BASE) MCG/ACT IN AERS Inhalation Inhale 2 puffs into the lungs every 4 (four) hours as needed. Shortness of Breath     . ALBUTEROL SULFATE HFA 108 (90 BASE) MCG/ACT IN AERS Inhalation Inhale 1-2 puffs into the lungs every 6 (six) hours as needed for wheezing. 1 Inhaler 0  . FESOTERODINE FUMARATE ER 8 MG PO TB24 Oral Take 8 mg by mouth daily.      . IMIPRAMINE HCL 50 MG PO TABS Oral Take 50 mg by mouth at bedtime.      Marland Kitchen METOPROLOL TARTRATE 50 MG PO TABS Oral Take 50 mg by mouth 2 (two) times daily.      Marland Kitchen PREDNISONE 10 MG PO TABS  Take 6 tablets day one, 5 tablets day two, 4 tablets day three, 3 tablets day four, 2 tablets day five, then 1 tablet day six 21 tablet 0  . PREGABALIN 100 MG PO CAPS Oral Take 100 mg by mouth 3 (three) times daily.        BP 137/80  Pulse 90  Resp 20  Ht 5\' 7"  (1.702 m)  Wt 289 lb (131.09 kg)  BMI 45.26 kg/m2  SpO2 100%  Physical Exam  Nursing note and vitals reviewed. Constitutional: She is oriented to person, place, and time. She appears well-developed and well-nourished. No distress.  HENT:  Head: Normocephalic and atraumatic.  Mouth/Throat: Oropharynx is clear and moist.  Neck: Normal range of motion. Neck supple.  Cardiovascular: Normal rate, regular rhythm, normal heart sounds and intact distal pulses.   No murmur heard. Pulmonary/Chest: Effort normal and breath sounds normal. No respiratory distress.  Musculoskeletal: Normal range of motion. She exhibits no edema and no tenderness.  Lymphadenopathy:    She has no cervical adenopathy.  Neurological: She is alert and oriented to person, place, and time. She exhibits normal muscle tone. Coordination normal.  Skin: Skin is warm and dry.       Scattered maculopapular rash to most of the body.  No edema.  No vesicles or pustules.      ED Course  Procedures (including critical care time)    1. Allergic reaction       MDM       Patient is alert to  vital signs are stable. She is nontoxic appearing. Scattered maculopapular rash to the trunk and proximal limbs. Rash appears consistent with an allergic reaction. There's no edema. Airway is patent. I will treat her symptoms with Benadryl, steroids and Pepcid.  Patient agrees to return to the emergency department for any worsening symptoms.  Patient / Family / Caregiver understand and agree with initial ED impression and plan with expectations set for ED visit. Pt stable in ED with no significant deterioration in condition. Pt feels improved after observation and/or treatment in ED.     Lundy Cozart L. Teiara Baria, [place] 03/05/12 1105

## 2012-03-13 NOTE — ED Provider Notes (Signed)
Medical screening examination/treatment/procedure(s) were performed by non-physician practitioner and as supervising physician I was immediately available for consultation/collaboration.  Nicoletta Dress. Colon Branch, MD 03/13/12 1357

## 2012-07-02 ENCOUNTER — Emergency Department (HOSPITAL_COMMUNITY)
Admission: EM | Admit: 2012-07-02 | Discharge: 2012-07-02 | Disposition: A | Discharge status: Home / Self Care | Payer: Medicare Other | Attending: Emergency Medicine | Admitting: Emergency Medicine

## 2012-07-02 ENCOUNTER — Encounter (HOSPITAL_COMMUNITY): Payer: Self-pay | Admitting: Emergency Medicine

## 2012-07-02 DIAGNOSIS — E119 Type 2 diabetes mellitus without complications: Secondary | ICD-10-CM | POA: Insufficient documentation

## 2012-07-02 DIAGNOSIS — J45909 Unspecified asthma, uncomplicated: Secondary | ICD-10-CM | POA: Insufficient documentation

## 2012-07-02 DIAGNOSIS — I10 Essential (primary) hypertension: Secondary | ICD-10-CM | POA: Insufficient documentation

## 2012-07-02 DIAGNOSIS — R51 Headache: Secondary | ICD-10-CM

## 2012-07-02 DIAGNOSIS — Z87891 Personal history of nicotine dependence: Secondary | ICD-10-CM | POA: Insufficient documentation

## 2012-07-02 LAB — CBC WITH DIFFERENTIAL/PLATELET
Eosinophils Absolute: 0.1 10*3/uL (ref 0.0–0.7)
Hemoglobin: 13.8 g/dL (ref 12.0–15.0)
Lymphs Abs: 1.4 10*3/uL (ref 0.7–4.0)
MCH: 29.7 pg (ref 26.0–34.0)
Monocytes Relative: 11 % (ref 3–12)
Neutro Abs: 1.8 10*3/uL (ref 1.7–7.7)
Neutrophils Relative %: 47 % (ref 43–77)
RBC: 4.64 MIL/uL (ref 3.87–5.11)

## 2012-07-02 LAB — BASIC METABOLIC PANEL
Chloride: 102 mEq/L (ref 96–112)
GFR calc non Af Amer: 80 mL/min — ABNORMAL LOW (ref >90)
Glucose, Bld: 98 mg/dL (ref 70–99)
Potassium: 4.2 mEq/L (ref 3.5–5.1)
Sodium: 135 mEq/L (ref 135–145)

## 2012-07-02 MED ORDER — SODIUM CHLORIDE 0.9 % IV SOLN
INTRAVENOUS | Status: DC
  Administered 2012-07-02: 14:00:00 via INTRAVENOUS

## 2012-07-02 MED ORDER — DIPHENHYDRAMINE HCL 50 MG/ML IJ SOLN
25.0000 mg | Freq: Once | INTRAMUSCULAR | Status: AC
  Administered 2012-07-02: 25 mg via INTRAVENOUS
  Filled 2012-07-02: qty 1

## 2012-07-02 MED ORDER — SODIUM CHLORIDE 0.9 % IV SOLN: Freq: Once | INTRAVENOUS | Status: DC

## 2012-07-02 MED ORDER — DEXAMETHASONE SODIUM PHOSPHATE 4 MG/ML IJ SOLN
10.0000 mg | Freq: Once | INTRAMUSCULAR | Status: AC
  Administered 2012-07-02: 10 mg via INTRAVENOUS
  Filled 2012-07-02: qty 3

## 2012-07-02 MED ORDER — METOCLOPRAMIDE HCL 5 MG/ML IJ SOLN
10.0000 mg | Freq: Once | INTRAMUSCULAR | Status: AC
  Administered 2012-07-02: 10 mg via INTRAVENOUS
  Filled 2012-07-02: qty 2

## 2012-07-02 NOTE — ED Provider Notes (Cosign Needed)
History  This chart was scribed for Carleene Cooper III, MD by Gerlean Ren. This patient was seen in room APA12/APA12 and the patient's care was started at 1:24PM.  CSN: [id number]  Arrival date & time 07/02/12  1209   First MD Initiated Contact with Patient 07/02/12 1324      Chief Complaint  Patient presents with  . Headache     Patient is a 56 y.o. female presenting with headaches. The history is provided by the patient. No language interpreter was used.  Headache  Pertinent negatives include no fever, no nausea and no vomiting.    [name] is a 56 y.o. female who presents to the Emergency Department complaining of 10 days of constant, gradually-worsening HA radiating down posterior neck described as a 10 out of 10 in severity.  Pt reports that pain began occipitally has since radiated throughout the rest of the head. She reports taking Advil with no improvement in symptoms. She denies having any prior epsidoes of similar symptoms. She denies having a family h/o migraines. She denies any incident or injury that may have caused symptoms.  She c/o cold sweats as an associated symptom but has not checked for fever. Pt denies fever, neck pain, sore throat, visual disturbance, CP, cough, SOB, abdominal pain, nausea, emesis, urinary symptoms, back pain, weakness, numbness and rash as associated symptoms.  Pt has h/o DM and HTN.  Pt is a former smoker but denies alcohol use.  PCP is Dr. Milford Cage.  Past Medical History  Diagnosis Date  . Diabetes mellitus   . Hypertension   . Asthma     Past Surgical History  Procedure Date  . Abdominal hysterectomy   . Knee replacements     bilateral  . Back surgery   . Tonsillectomy     No family history on file.  History  Substance Use Topics  . Smoking status: Former Games developer  . Smokeless tobacco: Not on file  . Alcohol Use: No    No OB history provided.  Review of Systems  Constitutional: Positive for chills. Negative for fever.   Cold sweats  HENT: Negative for congestion and sore throat.   Eyes: Negative for visual disturbance.  Respiratory: Negative for cough.   Cardiovascular: Negative for chest pain.  Gastrointestinal: Positive for diarrhea. Negative for nausea and vomiting.  Genitourinary: Negative for dysuria.  Musculoskeletal: Negative for back pain.  Skin: Negative for rash.  Neurological: Positive for headaches. Negative for seizures and syncope.    Allergies  Codeine  Home Medications   Current Outpatient Rx  Name Route Sig Dispense Refill  . ALBUTEROL SULFATE HFA 108 (90 BASE) MCG/ACT IN AERS Inhalation Inhale 2 puffs into the lungs every 4 (four) hours as needed. Shortness of Breath     . ALBUTEROL SULFATE HFA 108 (90 BASE) MCG/ACT IN AERS Inhalation Inhale 1-2 puffs into the lungs every 6 (six) hours as needed for wheezing. 1 Inhaler 0  . FESOTERODINE FUMARATE ER 8 MG PO TB24 Oral Take 8 mg by mouth daily.      . IMIPRAMINE HCL 50 MG PO TABS Oral Take 50 mg by mouth at bedtime.      Marland Kitchen METOPROLOL TARTRATE 50 MG PO TABS Oral Take 50 mg by mouth 2 (two) times daily.      Marland Kitchen PREDNISONE 10 MG PO TABS  Take 6 tablets day one, 5 tablets day two, 4 tablets day three, 3 tablets day four, 2 tablets day five, then 1 tablet day  six 21 tablet 0  . PREGABALIN 100 MG PO CAPS Oral Take 100 mg by mouth 3 (three) times daily.        Triage Vitals: BP 129/105  Pulse 87  Temp 97.9 F (36.6 C) (Oral)  Resp 20  Ht 5\' 7"  (1.702 m)  Wt 289 lb (131.09 kg)  BMI 45.26 kg/m2  SpO2 98%  Physical Exam  Nursing note and vitals reviewed. Constitutional: She is oriented to person, place, and time. No distress.       Morbidly obese  HENT:  Head: Normocephalic and atraumatic.       Tenderness to palpation in temples  Eyes: Conjunctivae and EOM are normal. Pupils are equal, round, and reactive to light.  Neck: Normal range of motion.  Cardiovascular: Normal rate.   Pulmonary/Chest: Effort normal. No respiratory  distress.  Abdominal: Soft. There is no tenderness.  Musculoskeletal: Normal range of motion. She exhibits no edema and no tenderness.  Neurological: She is alert and oriented to person, place, and time.  Skin: Skin is warm and dry.  Psychiatric: She has a normal mood and affect. Her behavior is normal.    ED Course  Procedures (including critical care time)  DIAGNOSTIC STUDIES: Oxygen Saturation is 98% on room air, normal by my interpretation.    COORDINATION OF CARE: 1:41PM-Discussed treatment plan with pt at bedside and pt agreed to plan. 3:29PM- Informed pt that tests came back negative and pt reports that HA pain has diminished from a 10 out of ten to a 7/8 out of 10.  Discussed discharge plans with pt and pt agreed.   3:30 PM Results for orders placed during the hospital encounter of 07/02/12  BASIC METABOLIC PANEL      Component Value Range   Sodium 135  135 - 145 mEq/L   Potassium 4.2  3.5 - 5.1 mEq/L   Chloride 102  96 - 112 mEq/L   CO2 27  19 - 32 mEq/L   Glucose, Bld 98  70 - 99 mg/dL   BUN 11  6 - 23 mg/dL   Creatinine, Ser 2.95  0.50 - 1.10 mg/dL   Calcium 9.5  8.4 - 62.1 mg/dL   GFR calc non Af Amer 80 (*) >90 mL/min   GFR calc Af Amer >90  >90 mL/min  CBC WITH DIFFERENTIAL      Component Value Range   WBC 3.8 (*) 4.0 - 10.5 K/uL   RBC 4.64  3.87 - 5.11 MIL/uL   Hemoglobin 13.8  12.0 - 15.0 g/dL   HCT 30.8  65.7 - 84.6 %   MCV 89.7  78.0 - 100.0 fL   MCH 29.7  26.0 - 34.0 pg   MCHC 33.2  30.0 - 36.0 g/dL   RDW 96.2  95.2 - 84.1 %   Platelets 155  150 - 400 K/uL   Neutrophils Relative 47  43 - 77 %   Neutro Abs 1.8  1.7 - 7.7 K/uL   Lymphocytes Relative 37  12 - 46 %   Lymphs Abs 1.4  0.7 - 4.0 K/uL   Monocytes Relative 11  3 - 12 %   Monocytes Absolute 0.4  0.1 - 1.0 K/uL   Eosinophils Relative 3  0 - 5 %   Eosinophils Absolute 0.1  0.0 - 0.7 K/uL   Basophils Relative 1  0 - 1 %   Basophils Absolute 0.1  0.0 - 0.1 K/uL  SEDIMENTATION RATE       Component  Value Range   Sed Rate 8  0 - 22 mm/hr   3:32 PM Lab workup is negative.  Pt rates pain at a 7 now.  Will give rescue med of Dilaudid 1 mg IV.  Advised pt to rest in a darkened room for rest of day, call Dr. Webb Laws office in the morning to make a followup appointment.      1. Headache     I personally performed the services described in this documentation, which was scribed in my presence. The recorded information has been reviewed and considered.  Osvaldo Human, M.D.    Carleene Cooper III, MD 07/02/12 1536

## 2012-07-02 NOTE — ED Notes (Signed)
MD at bedside. 

## 2012-07-02 NOTE — ED Notes (Signed)
Pt c/o posterior ha x 1 week with pain radiating into sides of neck. Pt also c/o chills.

## 2012-07-02 NOTE — ED Notes (Signed)
C/o HA x 1 week, denies N/V, denies any CP or SOB, unrelieved with OTC meds, pt states that she has been compliant with taking HTN meds, + cold sweats per pt

## 2012-07-30 ENCOUNTER — Other Ambulatory Visit: Payer: Self-pay

## 2012-07-30 ENCOUNTER — Emergency Department (HOSPITAL_COMMUNITY): Payer: Medicare Other

## 2012-07-30 ENCOUNTER — Emergency Department (HOSPITAL_COMMUNITY)
Admission: EM | Admit: 2012-07-30 | Discharge: 2012-07-30 | Disposition: A | Discharge status: Home / Self Care | Payer: Medicare Other | Attending: Emergency Medicine | Admitting: Emergency Medicine

## 2012-07-30 ENCOUNTER — Encounter (HOSPITAL_COMMUNITY): Payer: Self-pay | Admitting: *Deleted

## 2012-07-30 DIAGNOSIS — M25519 Pain in unspecified shoulder: Secondary | ICD-10-CM | POA: Insufficient documentation

## 2012-07-30 DIAGNOSIS — I10 Essential (primary) hypertension: Secondary | ICD-10-CM | POA: Insufficient documentation

## 2012-07-30 DIAGNOSIS — E119 Type 2 diabetes mellitus without complications: Secondary | ICD-10-CM | POA: Insufficient documentation

## 2012-07-30 DIAGNOSIS — R200 Anesthesia of skin: Secondary | ICD-10-CM

## 2012-07-30 DIAGNOSIS — J45909 Unspecified asthma, uncomplicated: Secondary | ICD-10-CM | POA: Insufficient documentation

## 2012-07-30 DIAGNOSIS — Z96659 Presence of unspecified artificial knee joint: Secondary | ICD-10-CM | POA: Insufficient documentation

## 2012-07-30 DIAGNOSIS — Z8673 Personal history of transient ischemic attack (TIA), and cerebral infarction without residual deficits: Secondary | ICD-10-CM | POA: Insufficient documentation

## 2012-07-30 DIAGNOSIS — Z79899 Other long term (current) drug therapy: Secondary | ICD-10-CM | POA: Insufficient documentation

## 2012-07-30 DIAGNOSIS — R209 Unspecified disturbances of skin sensation: Secondary | ICD-10-CM | POA: Insufficient documentation

## 2012-07-30 DIAGNOSIS — H538 Other visual disturbances: Secondary | ICD-10-CM | POA: Insufficient documentation

## 2012-07-30 LAB — CBC WITH DIFFERENTIAL/PLATELET
Eosinophils Relative: 3 % (ref 0–5)
HCT: 41.1 % (ref 36.0–46.0)
Lymphocytes Relative: 33 % (ref 12–46)
Lymphs Abs: 1.6 10*3/uL (ref 0.7–4.0)
MCV: 89.7 fL (ref 78.0–100.0)
Monocytes Absolute: 0.4 10*3/uL (ref 0.1–1.0)
RBC: 4.58 MIL/uL (ref 3.87–5.11)
WBC: 4.7 10*3/uL (ref 4.0–10.5)

## 2012-07-30 LAB — COMPREHENSIVE METABOLIC PANEL
ALT: 47 U/L — ABNORMAL HIGH (ref 0–35)
CO2: 28 mEq/L (ref 19–32)
Calcium: 9.7 mg/dL (ref 8.4–10.5)
Creatinine, Ser: 0.87 mg/dL (ref 0.50–1.10)
GFR calc Af Amer: 85 mL/min — ABNORMAL LOW (ref >90)
GFR calc non Af Amer: 73 mL/min — ABNORMAL LOW (ref >90)
Glucose, Bld: 117 mg/dL — ABNORMAL HIGH (ref 70–99)

## 2012-07-30 NOTE — ED Notes (Signed)
Discharge instructions reviewed with pt, questions answered. Pt verbalized understanding.  

## 2012-07-30 NOTE — ED Provider Notes (Addendum)
History  This chart was scribed for Chloe Cooper III, MD by Ladona Ridgel Day. This patient was seen in room APA01/APA01 and the patient's care was started at 1507.   CSN: [id number]  Arrival date & time 07/30/12  1507   First MD Initiated Contact with Patient 07/30/12 1529      Chief Complaint  Patient presents with  . Numbness   Patient is a 56 y.o. female presenting with neurologic complaint. The history is provided by the patient. No language interpreter was used.  Neurologic Problem The primary symptoms include visual change (Left eye vision blurry. ) and loss of sensation (Left sided facial numbness. ). Primary symptoms do not include headaches, loss of consciousness, focal weakness, fever, nausea or vomiting. The symptoms began 1 to 2 hours ago. The episode lasted 30 minutes. The symptoms are resolved. The neurological symptoms are focal.  Additional symptoms do not include weakness or hearing loss. Medical issues also include hypertension. Associated medical issues comments: TIA.   [name] is a 56 y.o. female who presents to the Emergency Department complaining of constant left sided facial numbness for 30 minutes prior to arrival. She states that she has had similar previous episode with a TIA 6 years ago but is different now because she only has left sided facial numbness which is currently resolved. She states left shoulder pain worsened with active ROM but denies any recent injuries to her shoulder, neck or head. Her associated symptoms are blurry vision of her left eye, and mild stuttering of her speech. She denies recent cold, fever, ear ache, sore throat cough, CP, emesis, diarrhea, urinary symptoms, rash and syncope. She has a history of chronic migraines but states she is not currently having one. She is allergic to codeine and does not smoke or drink ETOH.   Past Medical History  Diagnosis Date  . Diabetes mellitus   . Hypertension   . Asthma     Past Surgical History    Procedure Date  . Abdominal hysterectomy   . Knee replacements     bilateral  . Back surgery   . Tonsillectomy     History reviewed. No pertinent family history.  History  Substance Use Topics  . Smoking status: Former Games developer  . Smokeless tobacco: Not on file  . Alcohol Use: No    OB History    Grav Para Term Preterm Abortions TAB SAB Ect Mult Living                  Review of Systems  Constitutional: Negative for fever and chills.  HENT: Negative for hearing loss, ear pain, congestion and sore throat.   Eyes:       Vision of left eye blurry.   Respiratory: Negative for cough and shortness of breath.   Cardiovascular: Negative for chest pain.  Gastrointestinal: Negative for nausea, vomiting and abdominal pain.  Genitourinary: Negative for difficulty urinating.  Musculoskeletal:       Left shoulder pain with ROM  Skin: Negative for color change and rash.  Neurological: Negative for focal weakness, loss of consciousness, weakness and headaches.       Left sided facial numbness.   All other systems reviewed and are negative.    Allergies  Codeine  Home Medications   Current Outpatient Rx  Name Route Sig Dispense Refill  . ALBUTEROL SULFATE HFA 108 (90 BASE) MCG/ACT IN AERS Inhalation Inhale 2 puffs into the lungs every 4 (four) hours as needed. Shortness of Breath     .  ATORVASTATIN CALCIUM 10 MG PO TABS Oral Take 10 mg by mouth at bedtime.    . FESOTERODINE FUMARATE ER 8 MG PO TB24 Oral Take 8 mg by mouth daily.      . IMIPRAMINE HCL 50 MG PO TABS Oral Take 50 mg by mouth at bedtime.      Marland Kitchen METOPROLOL TARTRATE 50 MG PO TABS Oral Take 50 mg by mouth 2 (two) times daily.        Triage Vitals: BP 146/94  Pulse 86  Temp 97.9 F (36.6 C) (Oral)  Resp 18  Ht 5\' 7"  (1.702 m)  Wt 298 lb (135.172 kg)  BMI 46.67 kg/m2  SpO2 95%  Physical Exam  Nursing note and vitals reviewed. Constitutional: She is oriented to person, place, and time. She appears  well-developed and well-nourished. No distress.       Obese.  HENT:  Head: Normocephalic and atraumatic.  Right Ear: External ear normal.  Left Ear: External ear normal.  Mouth/Throat: Oropharynx is clear and moist.  Eyes: Conjunctivae and EOM are normal.  Neck: Neck supple. No tracheal deviation present.       No carotic bruit.   Cardiovascular: Normal rate, regular rhythm and normal heart sounds.   Pulmonary/Chest: Effort normal. No stridor. No respiratory distress.  Abdominal: Soft. There is no tenderness. There is no rebound.       Obese abdomen   Musculoskeletal: Normal range of motion.       Left shoulder pain with passive ROM  Neurological: She is alert and oriented to person, place, and time. No cranial nerve deficit. Coordination normal.       No asymmetry of her face. Normal coordination and cranial nerves 2-12 intact. All four extremities are with normal motor strength. Qualitative facial numbness sensation left side of her face from her left forehead to left cheek.   Skin: Skin is warm and dry.  Psychiatric: She has a normal mood and affect. Her behavior is normal.    ED Course  Procedures (including critical care time) DIAGNOSTIC STUDIES: Oxygen Saturation is 95% on room air, adequate by my interpretation.    COORDINATION OF CARE: At 400 PM Discussed treatment plan with patient which includes EKG, left shoulder X-ray, head CT, blood work, and UA. Patient agrees.   At 515 PM Rechecked patient who states her left sided facial numbness has improved here in the ED and informed her of normal head CT, blood work, and urine analysis. Patient agrees and understands.   Labs Reviewed  CBC WITH DIFFERENTIAL  COMPREHENSIVE METABOLIC PANEL  URINALYSIS, ROUTINE W REFLEX MICROSCOPIC     Date: 07/30/2012  Rate: 84  Rhythm: normal sinus rhythm and premature atrial contractions (PAC)  QRS Axis: normal  Intervals: normal  ST/T Wave abnormalities: normal  Conduction  Disutrbances:none  Narrative Interpretation: Normal EKG  Old EKG Reviewed: unchanged    5:12 PM Results for orders placed during the hospital encounter of 07/30/12  CBC WITH DIFFERENTIAL      Component Value Range   WBC 4.7  4.0 - 10.5 K/uL   RBC 4.58  3.87 - 5.11 MIL/uL   Hemoglobin 13.7  12.0 - 15.0 g/dL   HCT 16.1  09.6 - 04.5 %   MCV 89.7  78.0 - 100.0 fL   MCH 29.9  26.0 - 34.0 pg   MCHC 33.3  30.0 - 36.0 g/dL   RDW 40.9  81.1 - 91.4 %   Platelets 198  150 - 400 K/uL  Neutrophils Relative 54  43 - 77 %   Neutro Abs 2.5  1.7 - 7.7 K/uL   Lymphocytes Relative 33  12 - 46 %   Lymphs Abs 1.6  0.7 - 4.0 K/uL   Monocytes Relative 8  3 - 12 %   Monocytes Absolute 0.4  0.1 - 1.0 K/uL   Eosinophils Relative 3  0 - 5 %   Eosinophils Absolute 0.1  0.0 - 0.7 K/uL   Basophils Relative 2 (*) 0 - 1 %   Basophils Absolute 0.1  0.0 - 0.1 K/uL  COMPREHENSIVE METABOLIC PANEL      Component Value Range   Sodium 142  135 - 145 mEq/L   Potassium 3.8  3.5 - 5.1 mEq/L   Chloride 105  96 - 112 mEq/L   CO2 28  19 - 32 mEq/L   Glucose, Bld 117 (*) 70 - 99 mg/dL   BUN 14  6 - 23 mg/dL   Creatinine, Ser 1.61  0.50 - 1.10 mg/dL   Calcium 9.7  8.4 - 09.6 mg/dL   Total Protein 7.1  6.0 - 8.3 g/dL   Albumin 4.3  3.5 - 5.2 g/dL   AST 57 (*) 0 - 37 U/L   ALT 47 (*) 0 - 35 U/L   Alkaline Phosphatase 100  39 - 117 U/L   Total Bilirubin 0.5  0.3 - 1.2 mg/dL   GFR calc non Af Amer 73 (*) >90 mL/min   GFR calc Af Amer 85 (*) >90 mL/min   Ct Head Wo Contrast  07/30/2012  *RADIOLOGY REPORT*  Clinical Data: Left side facial numbness.  CT HEAD WITHOUT CONTRAST  Technique:  Contiguous axial images were obtained from the base of the skull through the vertex without contrast.  Comparison: None.  Findings: The brain appears normal without evidence of infarct, hemorrhage, mass, mass effect, midline shift or abnormal extra- axial fluid collection.  No hydrocephalus or pneumocephalus. Calvarium intact.  Imaged  paranasal sinuses and mastoid air cells are clear.  IMPRESSION: Negative exam.  Original Report Authenticated By: Bernadene Bell. D'ALESSIO, M.D.   Dg Shoulder Left  07/30/2012  *RADIOLOGY REPORT*  Clinical Data: Left arm numbness for 4 days.  LEFT SHOULDER - 2+ VIEW  Comparison: Three views left shoulder 01/01/2009.  Findings: The humerus is located and the acromioclavicular joint is intact.  There is no fracture.  Mild appearing acromioclavicular degenerative change noted.  Left lung and ribs are clear.  IMPRESSION: No acute finding.  Original Report Authenticated By: Bernadene Bell. D'ALESSIO, M.D.       1. Left facial numbness       I personally performed the services described in this documentation, which was scribed in my presence. The recorded information has been reviewed and considered.  Osvaldo Human, MD       Chloe Cooper III, MD 07/30/12 1720     Chloe Cooper III, MD 09/09/12 0454     Chloe Cooper III, MD 09/14/12 [phone]

## 2012-07-30 NOTE — ED Notes (Signed)
Pt c/o numbness in the left side of her face for approximately 30 minutes. Pt alert and oriented x 3. Able to squint eyes, stick out tongue and smile with facial symmetry. Pt able to raise both arms and legs without difficulty. Strong hand grips bilaterally. Also c/o left shoulder pain x 2 days but is worse today.

## 2012-11-05 ENCOUNTER — Encounter (HOSPITAL_COMMUNITY): Payer: Self-pay

## 2012-11-05 ENCOUNTER — Other Ambulatory Visit: Payer: Self-pay

## 2012-11-05 ENCOUNTER — Emergency Department (HOSPITAL_COMMUNITY)
Admission: EM | Admit: 2012-11-05 | Discharge: 2012-11-05 | Disposition: A | Discharge status: Home / Self Care | Payer: Medicare Other | Attending: Emergency Medicine | Admitting: Emergency Medicine

## 2012-11-05 DIAGNOSIS — M25519 Pain in unspecified shoulder: Secondary | ICD-10-CM | POA: Insufficient documentation

## 2012-11-05 DIAGNOSIS — E119 Type 2 diabetes mellitus without complications: Secondary | ICD-10-CM | POA: Insufficient documentation

## 2012-11-05 DIAGNOSIS — Z79899 Other long term (current) drug therapy: Secondary | ICD-10-CM | POA: Insufficient documentation

## 2012-11-05 DIAGNOSIS — J45909 Unspecified asthma, uncomplicated: Secondary | ICD-10-CM | POA: Insufficient documentation

## 2012-11-05 DIAGNOSIS — Z87891 Personal history of nicotine dependence: Secondary | ICD-10-CM | POA: Insufficient documentation

## 2012-11-05 DIAGNOSIS — R079 Chest pain, unspecified: Secondary | ICD-10-CM | POA: Insufficient documentation

## 2012-11-05 DIAGNOSIS — M25512 Pain in left shoulder: Secondary | ICD-10-CM

## 2012-11-05 DIAGNOSIS — I10 Essential (primary) hypertension: Secondary | ICD-10-CM | POA: Insufficient documentation

## 2012-11-05 LAB — CBC WITH DIFFERENTIAL/PLATELET
Basophils Relative: 1 % (ref 0–1)
Hemoglobin: 13.9 g/dL (ref 12.0–15.0)
MCHC: 33.3 g/dL (ref 30.0–36.0)
Monocytes Relative: 9 % (ref 3–12)
Neutro Abs: 2.4 10*3/uL (ref 1.7–7.7)
Neutrophils Relative %: 56 % (ref 43–77)
RBC: 4.65 MIL/uL (ref 3.87–5.11)

## 2012-11-05 LAB — BASIC METABOLIC PANEL
BUN: 16 mg/dL (ref 6–23)
Chloride: 106 mEq/L (ref 96–112)
GFR calc Af Amer: 80 mL/min — ABNORMAL LOW (ref >90)
Potassium: 4 mEq/L (ref 3.5–5.1)
Sodium: 140 mEq/L (ref 135–145)

## 2012-11-05 LAB — TROPONIN I: Troponin I: <0.3 ng/mL (ref <0.30)

## 2012-11-05 MED ORDER — HYDROCODONE-ACETAMINOPHEN 5-325 MG PO TABS
2.0000 | ORAL_TABLET | Freq: Once | ORAL | Status: AC
  Administered 2012-11-05: 2 via ORAL
  Filled 2012-11-05: qty 2

## 2012-11-05 MED ORDER — HYDROCODONE-ACETAMINOPHEN 5-500 MG PO TABS
2.0000 | ORAL_TABLET | Freq: Four times a day (QID) | ORAL | Status: DC | PRN
Start: 2012-11-05 — End: 2013-01-06

## 2012-11-05 NOTE — ED Notes (Signed)
Left arm/chest pain that began this morning. Pt c/o left sided pain that is constant and sharp. Denies n/v

## 2012-11-05 NOTE — ED Provider Notes (Addendum)
History     CSN: [id number]  Arrival date & time 11/05/12  1551   First MD Initiated Contact with Patient 11/05/12 1631      Chief Complaint  Patient presents with  . Arm Pain  . Chest Pain    (Consider location/radiation/quality/duration/timing/severity/associated sxs/prior treatment) HPI Complains of left shoulder pain radiating to left forearm onset upon awakening 7:30 AM today. Patient only has pain when she moves her left shoulder has no pain when remaining still. No chest pain no shortness of breath no nausea no sweatiness no other symptoms associated. Denies injury denies fever. Denies neck pain. Past Medical History  Diagnosis Date  . Diabetes mellitus   . Hypertension   . Asthma   . Overactive bladder     Past Surgical History  Procedure Date  . Abdominal hysterectomy   . Knee replacements     bilateral  . Back surgery   . Tonsillectomy     No family history on file.  History  Substance Use Topics  . Smoking status: Former Games developer  . Smokeless tobacco: Not on file  . Alcohol Use: No    OB History    Grav Para Term Preterm Abortions TAB SAB Ect Mult Living                  Review of Systems  Musculoskeletal: Positive for arthralgias.       Left shoulder pain  All other systems reviewed and are negative.    Allergies  Codeine  Home Medications   Current Outpatient Rx  Name  Route  Sig  Dispense  Refill  . ALBUTEROL SULFATE HFA 108 (90 BASE) MCG/ACT IN AERS   Inhalation   Inhale 2 puffs into the lungs every 4 (four) hours as needed. Shortness of Breath          . ATORVASTATIN CALCIUM 10 MG PO TABS   Oral   Take 10 mg by mouth at bedtime.         . FESOTERODINE FUMARATE ER 8 MG PO TB24   Oral   Take 8 mg by mouth daily.           . IMIPRAMINE HCL 50 MG PO TABS   Oral   Take 50 mg by mouth at bedtime.           Marland Kitchen METOPROLOL TARTRATE 50 MG PO TABS   Oral   Take 50 mg by mouth 2 (two) times daily.             BP 127/76   Pulse 82  Temp 97.4 F (36.3 C) (Oral)  Resp 20  Ht 5\' 7"  (1.702 m)  Wt 300 lb (136.079 kg)  BMI 46.99 kg/m2  SpO2 100%  Physical Exam  Nursing note and vitals reviewed. Constitutional: She appears well-developed and well-nourished.  HENT:  Head: Normocephalic and atraumatic.  Eyes: Conjunctivae normal are normal. Pupils are equal, round, and reactive to light.  Neck: Neck supple. No tracheal deviation present. No thyromegaly present.  Cardiovascular: Normal rate and regular rhythm.   No murmur heard. Pulmonary/Chest: Effort normal and breath sounds normal.  Abdominal: Soft. Bowel sounds are normal. She exhibits no distension. There is no tenderness.       Morbidly obese  Musculoskeletal: Normal range of motion. She exhibits no edema and no tenderness.       Left upper extremity no redness no swelling no deformity she has  pain at left shoulder on active motion. Radial pulse 2+  good capillary refill all extremities without redness swelling or tenderness neurovascularly intact  Neurological: She is alert. Coordination normal.  Skin: Skin is warm and dry. No rash noted.  Psychiatric: She has a normal mood and affect.    ED Course  Procedures (including critical care time)  Date: 11/05/2012  Rate: 80  Rhythm: normal sinus rhythm  QRS Axis: normal  Intervals: normal  ST/T Wave abnormalities: normal  Conduction Disutrbances:none  Narrative Interpretation:   Old EKG Reviewed: No significant change over 07/04/2009 interpreted by me   Labs Reviewed  CBC WITH DIFFERENTIAL  BASIC METABOLIC PANEL  TROPONIN I   No results found.   No diagnosis found. Results for orders placed during the hospital encounter of 11/05/12  CBC WITH DIFFERENTIAL      Component Value Range   WBC 4.4  4.0 - 10.5 K/uL   RBC 4.65  3.87 - 5.11 MIL/uL   Hemoglobin 13.9  12.0 - 15.0 g/dL   HCT 78.2  95.6 - 21.3 %   MCV 89.9  78.0 - 100.0 fL   MCH 29.9  26.0 - 34.0 pg   MCHC 33.3  30.0 - 36.0 g/dL     RDW 08.6  57.8 - 46.9 %   Platelets 196  150 - 400 K/uL   Neutrophils Relative 56  43 - 77 %   Neutro Abs 2.4  1.7 - 7.7 K/uL   Lymphocytes Relative 32  12 - 46 %   Lymphs Abs 1.4  0.7 - 4.0 K/uL   Monocytes Relative 9  3 - 12 %   Monocytes Absolute 0.4  0.1 - 1.0 K/uL   Eosinophils Relative 2  0 - 5 %   Eosinophils Absolute 0.1  0.0 - 0.7 K/uL   Basophils Relative 1  0 - 1 %   Basophils Absolute 0.1  0.0 - 0.1 K/uL  BASIC METABOLIC PANEL      Component Value Range   Sodium 140  135 - 145 mEq/L   Potassium 4.0  3.5 - 5.1 mEq/L   Chloride 106  96 - 112 mEq/L   CO2 25  19 - 32 mEq/L   Glucose, Bld 116 (*) 70 - 99 mg/dL   BUN 16  6 - 23 mg/dL   Creatinine, Ser 6.29  0.50 - 1.10 mg/dL   Calcium 9.5  8.4 - 52.8 mg/dL   GFR calc non Af Amer 69 (*) >90 mL/min   GFR calc Af Amer 80 (*) >90 mL/min  TROPONIN I      Component Value Range   Troponin I <0.30  <0.30 ng/mL   No results found.   5:25 PM pain improved after treatment with Vicodin MDM  Strongly doubt cardiac etiology patient denies chest pain pain left shoulder is exacerbated easily but with active movement. Pain is felt to be musculoskeletal in etiology Plan prescription Vicodin followup Dr. [name] if continued tabs significant pain in 3 days Diagnosis left shoulder pain        Doug Sou, MD 11/05/12 1730  Doug Sou, MD 11/05/12 1730

## 2012-11-14 ENCOUNTER — Emergency Department (HOSPITAL_COMMUNITY): Payer: Medicare Other

## 2012-11-14 ENCOUNTER — Emergency Department (HOSPITAL_COMMUNITY)
Admission: EM | Admit: 2012-11-14 | Discharge: 2012-11-15 | Disposition: A | Discharge status: Home / Self Care | Payer: Medicare Other | Attending: Emergency Medicine | Admitting: Emergency Medicine

## 2012-11-14 ENCOUNTER — Encounter (HOSPITAL_COMMUNITY): Payer: Self-pay

## 2012-11-14 DIAGNOSIS — Z87448 Personal history of other diseases of urinary system: Secondary | ICD-10-CM | POA: Insufficient documentation

## 2012-11-14 DIAGNOSIS — E119 Type 2 diabetes mellitus without complications: Secondary | ICD-10-CM | POA: Insufficient documentation

## 2012-11-14 DIAGNOSIS — Z79899 Other long term (current) drug therapy: Secondary | ICD-10-CM | POA: Insufficient documentation

## 2012-11-14 DIAGNOSIS — I10 Essential (primary) hypertension: Secondary | ICD-10-CM | POA: Insufficient documentation

## 2012-11-14 DIAGNOSIS — M109 Gout, unspecified: Secondary | ICD-10-CM | POA: Insufficient documentation

## 2012-11-14 DIAGNOSIS — Z87891 Personal history of nicotine dependence: Secondary | ICD-10-CM | POA: Insufficient documentation

## 2012-11-14 DIAGNOSIS — J45909 Unspecified asthma, uncomplicated: Secondary | ICD-10-CM | POA: Insufficient documentation

## 2012-11-14 MED ORDER — OXYCODONE-ACETAMINOPHEN 5-325 MG PO TABS
1.0000 | ORAL_TABLET | Freq: Once | ORAL | Status: AC
  Administered 2012-11-14: 1 via ORAL
  Filled 2012-11-14: qty 1

## 2012-11-14 MED ORDER — OXYCODONE-ACETAMINOPHEN 5-325 MG PO TABS: 1.0000 | ORAL_TABLET | ORAL | Status: DC | PRN

## 2012-11-14 MED ORDER — INDOMETHACIN 25 MG PO CAPS: 25.0000 mg | ORAL_CAPSULE | Freq: Three times a day (TID) | ORAL | Status: DC | PRN

## 2012-11-14 NOTE — ED Provider Notes (Signed)
History  This chart was scribed for Celene Kras, MD by Erskine Emery, ED Scribe. This patient was seen in room APA19/APA19 and the patient's care was started at 23:21.  CSN: [id number]  Arrival date & time 11/14/12  2314   First MD Initiated Contact with Patient 11/14/12 2321     Chief Complaint  Patient presents with  . Toe Pain   (Consider location/radiation/quality/duration/timing/severity/associated sxs/prior treatment) The history is provided by the patient. No language interpreter was used.  [name] is a 56 y.o. female who presents to the Emergency Department complaining of sudden onset cramping left great toe pain, since this evening while sitting on the couch. Pt denies any injury to the area, or any associated calf pain, fevers, cough, infection, emesis, or diarrhea. Pt reports the toe is usually blue, due to bad circulation from a h/o knee surgery in that leg. Pt has a h/o DM, HTN, asthma, and overactive bladder.   Past Medical History  Diagnosis Date  . Diabetes mellitus   . Hypertension   . Asthma   . Overactive bladder     Past Surgical History  Procedure Date  . Abdominal hysterectomy   . Knee replacements     bilateral  . Back surgery   . Tonsillectomy     No family history on file.  History  Substance Use Topics  . Smoking status: Former Games developer  . Smokeless tobacco: Not on file  . Alcohol Use: No    Review of Systems  Constitutional: Negative for fever and chills.  Respiratory: Negative for shortness of breath.   Gastrointestinal: Negative for nausea and vomiting.  Musculoskeletal:       Left great toe pain  Skin: Negative for wound.  Neurological: Negative for weakness.  Psychiatric/Behavioral: Negative for self-injury.  All other systems reviewed and are negative.     Allergies  Codeine  Home Medications   Current Outpatient Rx  Name  Route  Sig  Dispense  Refill  . ALBUTEROL SULFATE HFA 108 (90 BASE) MCG/ACT IN AERS  Inhalation   Inhale 2 puffs into the lungs every 4 (four) hours as needed. Shortness of Breath          . ATORVASTATIN CALCIUM 10 MG PO TABS   Oral   Take 10 mg by mouth at bedtime.         . FESOTERODINE FUMARATE ER 8 MG PO TB24   Oral   Take 8 mg by mouth daily.           Marland Kitchen HYDROCODONE-ACETAMINOPHEN 5-500 MG PO TABS   Oral   Take 2 tablets by mouth every 6 (six) hours as needed for pain.   16 tablet   0   . IMIPRAMINE HCL 50 MG PO TABS   Oral   Take 50 mg by mouth at bedtime.           Marland Kitchen METOPROLOL TARTRATE 50 MG PO TABS   Oral   Take 50 mg by mouth 2 (two) times daily.             There were no vitals taken for this visit.  Physical Exam  Nursing note and vitals reviewed. Constitutional: No distress.       Obese   HENT:  Head: Normocephalic and atraumatic.  Right Ear: External ear normal.  Left Ear: External ear normal.  Eyes: Conjunctivae normal are normal. Right eye exhibits no discharge. Left eye exhibits no discharge. No scleral icterus.  Neck: Neck  supple. No tracheal deviation present.  Cardiovascular: Normal rate.   Pulses:      Dorsalis pedis pulses are 2+ on the right side, and 2+ on the left side.  Pulmonary/Chest: Effort normal. No stridor. No respiratory distress.  Musculoskeletal: She exhibits tenderness. She exhibits no edema.       Left foot: She exhibits tenderness. She exhibits no swelling.       Feet:       Erythema of left great toe, ttp, pain with range of motion, no calf ttp, no ankle ttp, no lymphangitic streaking   Neurological: She is alert. Cranial nerve deficit: no gross deficits.  Skin: Skin is warm and dry. No rash noted.  Psychiatric: She has a normal mood and affect.    ED Course  Procedures (including critical care time) DIAGNOSTIC STUDIES: Oxygen Saturation is 99% on room air, normal by my interpretation.    COORDINATION OF CARE: 23:25--I evaluated the patient and we discussed a treatment plan including a foot  x-ray and medication to which the pt agreed. I explained to the pt that her symptoms do not seem to be circulation related and are probably due to gout.  Labs Reviewed - No data to display Dg Foot Complete Left  11/15/2012  *RADIOLOGY REPORT*  Clinical Data: Left foot pain, swelling, and erythema.  LEFT FOOT - COMPLETE 3+ VIEW  Comparison: None.  Findings: Soft tissue swelling is seen involving the great toe.  No evidence of fracture or dislocation.  No evidence of osteolysis or periostitis.  No evidence of arthropathy or other significant bone abnormality.  Incidental note is made of a fixation screws in the distal fibula.  IMPRESSION: Great toe soft tissue swelling.  No osseous abnormality.   Original Report Authenticated By: Myles Rosenthal, M.D.      1. Gout attack       MDM  Toe pain Doubt acute vascular etiology.  Doubt cellulitis.  Acute onset.  No other signs to suggest infection.  Suspect acute gout attack.  Will have pt follow up with pcp for close follow up      I personally performed the services described in this documentation, which was scribed in my presence. The recorded information has been reviewed and is accurate.    Celene Kras, MD 11/15/12 [phone]

## 2012-11-14 NOTE — ED Notes (Signed)
Patient now states that she fell out of bed yesterday, states that her head hit the nightstand and she has a few abrasions on her knees, but does not remember hurting her left foot.

## 2012-11-14 NOTE — ED Notes (Signed)
Patient states that she was sitting at home knitting and she started having left foot and left toe pain.  States she did not injure herself.

## 2013-01-06 ENCOUNTER — Encounter (HOSPITAL_COMMUNITY): Payer: Self-pay | Admitting: *Deleted

## 2013-01-06 ENCOUNTER — Emergency Department (HOSPITAL_COMMUNITY): Payer: Medicare Other

## 2013-01-06 ENCOUNTER — Emergency Department (HOSPITAL_COMMUNITY)
Admission: EM | Admit: 2013-01-06 | Discharge: 2013-01-06 | Disposition: A | Discharge status: Home / Self Care | Payer: Medicare Other | Attending: Emergency Medicine | Admitting: Emergency Medicine

## 2013-01-06 DIAGNOSIS — E119 Type 2 diabetes mellitus without complications: Secondary | ICD-10-CM | POA: Insufficient documentation

## 2013-01-06 DIAGNOSIS — Z79899 Other long term (current) drug therapy: Secondary | ICD-10-CM | POA: Insufficient documentation

## 2013-01-06 DIAGNOSIS — I10 Essential (primary) hypertension: Secondary | ICD-10-CM | POA: Insufficient documentation

## 2013-01-06 DIAGNOSIS — J40 Bronchitis, not specified as acute or chronic: Secondary | ICD-10-CM | POA: Insufficient documentation

## 2013-01-06 DIAGNOSIS — J45909 Unspecified asthma, uncomplicated: Secondary | ICD-10-CM | POA: Insufficient documentation

## 2013-01-06 DIAGNOSIS — R059 Cough, unspecified: Secondary | ICD-10-CM | POA: Insufficient documentation

## 2013-01-06 DIAGNOSIS — R6883 Chills (without fever): Secondary | ICD-10-CM | POA: Insufficient documentation

## 2013-01-06 DIAGNOSIS — R05 Cough: Secondary | ICD-10-CM | POA: Insufficient documentation

## 2013-01-06 DIAGNOSIS — Z87828 Personal history of other (healed) physical injury and trauma: Secondary | ICD-10-CM | POA: Insufficient documentation

## 2013-01-06 DIAGNOSIS — R0602 Shortness of breath: Secondary | ICD-10-CM | POA: Insufficient documentation

## 2013-01-06 DIAGNOSIS — E7800 Pure hypercholesterolemia, unspecified: Secondary | ICD-10-CM | POA: Insufficient documentation

## 2013-01-06 HISTORY — DX: Pure hypercholesterolemia, unspecified: E78.00

## 2013-01-06 LAB — CBC
MCH: 29.7 pg (ref 26.0–34.0)
MCV: 89.4 fL (ref 78.0–100.0)
Platelets: 200 10*3/uL (ref 150–400)
RBC: 4.72 MIL/uL (ref 3.87–5.11)

## 2013-01-06 LAB — BASIC METABOLIC PANEL
CO2: 29 mEq/L (ref 19–32)
Calcium: 9.6 mg/dL (ref 8.4–10.5)
Creatinine, Ser: 0.92 mg/dL (ref 0.50–1.10)
Glucose, Bld: 98 mg/dL (ref 70–99)

## 2013-01-06 LAB — TROPONIN I: Troponin I: <0.3 ng/mL (ref <0.30)

## 2013-01-06 MED ORDER — PREDNISONE 20 MG PO TABS
40.0000 mg | ORAL_TABLET | Freq: Once | ORAL | Status: AC
  Administered 2013-01-06: 40 mg via ORAL
  Filled 2013-01-06: qty 2

## 2013-01-06 MED ORDER — PREDNISONE 20 MG PO TABS: 40.0000 mg | ORAL_TABLET | Freq: Every day | ORAL | Status: DC

## 2013-01-06 NOTE — ED Notes (Signed)
Pt c/o cough that is non productive, chills that started Wednesday, chest pain located mid center that started today, pain is non radiating, remains constant but does increase with coughing and breathing. Chest pain has been associated with dizziness today,

## 2013-01-06 NOTE — ED Notes (Signed)
Pt states cough for 3 days, Also notes mild chest pain over last day that is worse with coughing and deep breathing. Pt resting comfortably at this time.

## 2013-01-06 NOTE — ED Provider Notes (Signed)
History     CSN: [id number]  Arrival date & time 01/06/13  1726   First MD Initiated Contact with Patient 01/06/13 1752      Chief Complaint  Patient presents with  . Chest Pain  . Shortness of Breath  . Cough    (Consider location/radiation/quality/duration/timing/severity/associated sxs/prior treatment) HPI Pt with history of asthma as well as HTN/DM reports she has had a dry cough for the last few days associated with chills but no measured fever. Has been using home inhaler with some improvement but noticed a sharp, aching midsternal chest pain yesterday evening that has been persistent and more severe during the day today. Pain is non-radiating, worse with deep breath and cough.   Past Medical History  Diagnosis Date  . Diabetes mellitus   . Hypertension   . Asthma   . Overactive bladder   . High cholesterol   . Ankle fracture     required surgery     Past Surgical History  Procedure Date  . Abdominal hysterectomy   . Knee replacements     bilateral  . Back surgery   . Tonsillectomy     No family history on file.  History  Substance Use Topics  . Smoking status: Former Games developer  . Smokeless tobacco: Not on file  . Alcohol Use: No    OB History    Grav Para Term Preterm Abortions TAB SAB Ect Mult Living                  Review of Systems All other systems reviewed and are negative except as noted in HPI.   Allergies  Codeine  Home Medications   Current Outpatient Rx  Name  Route  Sig  Dispense  Refill  . ALBUTEROL SULFATE HFA 108 (90 BASE) MCG/ACT IN AERS   Inhalation   Inhale 2 puffs into the lungs every 4 (four) hours as needed. Shortness of Breath          . ATORVASTATIN CALCIUM 10 MG PO TABS   Oral   Take 10 mg by mouth at bedtime.         . FESOTERODINE FUMARATE ER 8 MG PO TB24   Oral   Take 8 mg by mouth daily.           . IMIPRAMINE HCL 50 MG PO TABS   Oral   Take 50 mg by mouth at bedtime.           Marland Kitchen METOPROLOL  TARTRATE 50 MG PO TABS   Oral   Take 50 mg by mouth 2 (two) times daily.             BP 140/78  Pulse 79  Temp 98 F (36.7 C)  Resp 20  Ht 5\' 7"  (1.702 m)  Wt 298 lb (135.172 kg)  BMI 46.67 kg/m2  SpO2 100%  Physical Exam  Nursing note and vitals reviewed. Constitutional: She is oriented to person, place, and time. She appears well-developed and well-nourished.  HENT:  Head: Normocephalic and atraumatic.  Eyes: EOM are normal. Pupils are equal, round, and reactive to light.  Neck: Normal range of motion. Neck supple.  Cardiovascular: Normal rate, normal heart sounds and intact distal pulses.   Pulmonary/Chest: Effort normal and breath sounds normal. She exhibits tenderness (mild, midsternal).  Abdominal: Bowel sounds are normal. She exhibits no distension. There is no tenderness.  Musculoskeletal: Normal range of motion. She exhibits no edema and no tenderness.  Neurological: She is  alert and oriented to person, place, and time. She has normal strength. No cranial nerve deficit or sensory deficit.  Skin: Skin is warm and dry. No rash noted.  Psychiatric: She has a normal mood and affect.    ED Course  Procedures (including critical care time)  Labs Reviewed  BASIC METABOLIC PANEL - Abnormal; Notable for the following:    GFR calc non Af Amer 68 (*)     GFR calc Af Amer 79 (*)     All other components within normal limits  CBC  TROPONIN I   Dg Chest 2 View  01/06/2013  *RADIOLOGY REPORT*  Clinical Data: Cough, chest pain  CHEST - 2 VIEW  Comparison:  11/21/2011  Findings: Cardiomediastinal silhouette is stable.  No acute infiltrate or pleural effusion.  No pulmonary edema.  Mild degenerative changes thoracic spine.  IMPRESSION: No active disease.  No significant change.   Original Report Authenticated By: Natasha Mead, M.D.      No diagnosis found.    MDM   Date: 01/06/2013  Rate: 81  Rhythm: normal sinus rhythm  QRS Axis: normal  Intervals: normal  ST/T Wave  abnormalities: normal  Conduction Disutrbances:none  Narrative Interpretation: Low voltage QRS  Old EKG Reviewed: unchanged  Pt having pleuritic chest pain, associated with cough. No hypoxia and lung sounds are normal now. Will send for CXR to rule out PNA. Doubt ACS/CAD but given risk factors will check labs. Ongoing pain for >12 hours, one troponin should suffice for rule out.   7:20 PM Labs and imaging are negative. Vitals are unremarkable. Suspect non-specific viral bronchitis. Advised to continue with inhaler as needed at home, short course of steroids and close PCP followup. Advised to return to the ED in the meantime for re-evaluation if symptoms worsen.        Nancye Grumbine B. Bernette Mayers, MD 01/06/13 1921

## 2013-02-02 ENCOUNTER — Emergency Department (HOSPITAL_COMMUNITY): Payer: Medicare Other

## 2013-02-02 ENCOUNTER — Emergency Department (HOSPITAL_COMMUNITY)
Admission: EM | Admit: 2013-02-02 | Discharge: 2013-02-02 | Disposition: A | Discharge status: Home / Self Care | Payer: Medicare Other | Attending: Emergency Medicine | Admitting: Emergency Medicine

## 2013-02-02 ENCOUNTER — Encounter (HOSPITAL_COMMUNITY): Payer: Self-pay | Admitting: *Deleted

## 2013-02-02 DIAGNOSIS — R51 Headache: Secondary | ICD-10-CM

## 2013-02-02 DIAGNOSIS — Z8742 Personal history of other diseases of the female genital tract: Secondary | ICD-10-CM | POA: Insufficient documentation

## 2013-02-02 DIAGNOSIS — J45909 Unspecified asthma, uncomplicated: Secondary | ICD-10-CM | POA: Insufficient documentation

## 2013-02-02 DIAGNOSIS — R300 Dysuria: Secondary | ICD-10-CM | POA: Insufficient documentation

## 2013-02-02 DIAGNOSIS — Z8781 Personal history of (healed) traumatic fracture: Secondary | ICD-10-CM | POA: Insufficient documentation

## 2013-02-02 DIAGNOSIS — I10 Essential (primary) hypertension: Secondary | ICD-10-CM | POA: Insufficient documentation

## 2013-02-02 DIAGNOSIS — Z87891 Personal history of nicotine dependence: Secondary | ICD-10-CM | POA: Insufficient documentation

## 2013-02-02 DIAGNOSIS — R209 Unspecified disturbances of skin sensation: Secondary | ICD-10-CM | POA: Insufficient documentation

## 2013-02-02 DIAGNOSIS — E119 Type 2 diabetes mellitus without complications: Secondary | ICD-10-CM | POA: Insufficient documentation

## 2013-02-02 DIAGNOSIS — Z79899 Other long term (current) drug therapy: Secondary | ICD-10-CM | POA: Insufficient documentation

## 2013-02-02 LAB — URINALYSIS, ROUTINE W REFLEX MICROSCOPIC
Glucose, UA: NEGATIVE mg/dL
Hgb urine dipstick: NEGATIVE
Leukocytes, UA: NEGATIVE
Specific Gravity, Urine: 1.025 (ref 1.005–1.030)
Urobilinogen, UA: 0.2 mg/dL (ref 0.0–1.0)

## 2013-02-02 LAB — COMPREHENSIVE METABOLIC PANEL
AST: 45 U/L — ABNORMAL HIGH (ref 0–37)
Albumin: 4.3 g/dL (ref 3.5–5.2)
Calcium: 9.4 mg/dL (ref 8.4–10.5)
Creatinine, Ser: 0.81 mg/dL (ref 0.50–1.10)

## 2013-02-02 LAB — CBC WITH DIFFERENTIAL/PLATELET
Basophils Absolute: 0.1 10*3/uL (ref 0.0–0.1)
Basophils Relative: 1 % (ref 0–1)
Eosinophils Absolute: 0.2 10*3/uL (ref 0.0–0.7)
Eosinophils Relative: 4 % (ref 0–5)
HCT: 43.8 % (ref 36.0–46.0)
MCH: 29.4 pg (ref 26.0–34.0)
MCHC: 33.1 g/dL (ref 30.0–36.0)
MCV: 88.8 fL (ref 78.0–100.0)
Monocytes Absolute: 0.4 10*3/uL (ref 0.1–1.0)
RDW: 12.8 % (ref 11.5–15.5)

## 2013-02-02 MED ORDER — KETOROLAC TROMETHAMINE 30 MG/ML IJ SOLN
30.0000 mg | Freq: Once | INTRAMUSCULAR | Status: AC
  Administered 2013-02-02: 30 mg via INTRAVENOUS
  Filled 2013-02-02: qty 1

## 2013-02-02 MED ORDER — OXYCODONE-ACETAMINOPHEN 5-325 MG PO TABS
1.0000 | ORAL_TABLET | Freq: Four times a day (QID) | ORAL | Status: DC | PRN
Start: 2013-02-02 — End: 2013-02-28

## 2013-02-02 MED ORDER — DIPHENHYDRAMINE HCL 50 MG/ML IJ SOLN
25.0000 mg | Freq: Once | INTRAMUSCULAR | Status: AC
  Administered 2013-02-02: 25 mg via INTRAVENOUS
  Filled 2013-02-02: qty 1

## 2013-02-02 MED ORDER — METOCLOPRAMIDE HCL 5 MG/ML IJ SOLN
10.0000 mg | Freq: Once | INTRAMUSCULAR | Status: AC
  Administered 2013-02-02: 10 mg via INTRAVENOUS
  Filled 2013-02-02: qty 2

## 2013-02-02 NOTE — ED Notes (Addendum)
Lt side of head hurts, crying, Lt side of face hurts. Says she is unable to urinate since this am.

## 2013-02-02 NOTE — ED Provider Notes (Signed)
History    This chart was scribed for Benny Lennert, MD by Gerlean Ren, ED Scribe. This patient was seen in room [facility] and the patient's care was started at 4:55 PM    CSN: [id number]  Arrival date & time 02/02/13  1636   First MD Initiated Contact with Patient 02/02/13 1652      Chief Complaint  Patient presents with  . Headache     The history is provided by the patient. No language interpreter was used.  [name] is a 57 y.o. female with h/o DM, HTN who presents to the Emergency Department complaining of numbness in left side of face that first began 2 days ago but resolved on its own that day and returned this morning with associated gradual onset constant left-frontal/left-temporal HA rated as 10/10 that began this morning.  Pt also reports difficulty urinating all day today.  Pt denies h/o similar HA.  Pt denies fever, chills, nausea, emesis, diarrhea, visual disturbances.  Pt has codeine allergy.     Past Medical History  Diagnosis Date  . Diabetes mellitus   . Hypertension   . Asthma   . Overactive bladder   . High cholesterol   . Ankle fracture     required surgery     Past Surgical History  Procedure Laterality Date  . Abdominal hysterectomy    . Knee replacements      bilateral  . Back surgery    . Tonsillectomy    . Carpal tunnel release      History reviewed. No pertinent family history.  History  Substance Use Topics  . Smoking status: Former Games developer  . Smokeless tobacco: Not on file  . Alcohol Use: No    No OB history provided.   Review of Systems  Constitutional: Negative for fatigue.  HENT: Negative for congestion, sinus pressure and ear discharge.   Eyes: Negative for discharge.  Respiratory: Negative for cough.   Cardiovascular: Negative for chest pain.  Gastrointestinal: Negative for abdominal pain and diarrhea.  Genitourinary: Positive for difficulty urinating. Negative for frequency and hematuria.  Musculoskeletal: Negative  for back pain.  Skin: Negative for rash.  Neurological: Positive for numbness and headaches. Negative for seizures.  Psychiatric/Behavioral: Negative for hallucinations.    Allergies  Codeine  Home Medications   Current Outpatient Rx  Name  Route  Sig  Dispense  Refill  . albuterol (PROVENTIL HFA;VENTOLIN HFA) 108 (90 BASE) MCG/ACT inhaler   Inhalation   Inhale 2 puffs into the lungs every 4 (four) hours as needed. Shortness of Breath          . atorvastatin (LIPITOR) 10 MG tablet   Oral   Take 10 mg by mouth at bedtime.         Marland Kitchen Fesoterodine Fumarate (TOVIAZ) 8 MG TB24   Oral   Take 8 mg by mouth daily.           Marland Kitchen imipramine (TOFRANIL) 50 MG tablet   Oral   Take 50 mg by mouth at bedtime.           . metoprolol (LOPRESSOR) 50 MG tablet   Oral   Take 50 mg by mouth 2 (two) times daily.           . predniSONE (DELTASONE) 20 MG tablet   Oral   Take 2 tablets (40 mg total) by mouth daily.   8 tablet   0     BP 122/89  Pulse 80  Temp(Src) 97.7 F (36.5 C) (Oral)  Resp 18  Ht 5\' 7"  (1.702 m)  Wt 290 lb (131.543 kg)  BMI 45.41 kg/m2  SpO2 100%  Physical Exam  Nursing note and vitals reviewed. Constitutional: She is oriented to person, place, and time. She appears well-developed.  HENT:  Head: Normocephalic and atraumatic.  Eyes: Conjunctivae and EOM are normal. No scleral icterus.  Neck: Neck supple. No thyromegaly present.  Cardiovascular: Normal rate and regular rhythm.  Exam reveals no gallop and no friction rub.   No murmur heard. Pulmonary/Chest: No stridor. She has no wheezes. She has no rales. She exhibits no tenderness.  Abdominal: She exhibits no distension. There is no tenderness. There is no rebound.  Musculoskeletal: Normal range of motion. She exhibits no edema.  Lymphadenopathy:    She has no cervical adenopathy.  Neurological: She is oriented to person, place, and time. No cranial nerve deficit. Coordination normal.  Skin: No rash  noted. No erythema.  Psychiatric: She has a normal mood and affect. Her behavior is normal.    ED Course  Procedures (including critical care time) DIAGNOSTIC STUDIES: Oxygen Saturation is 100% on room air, normal by my interpretation.    COORDINATION OF CARE: 4:59 PM- Patient informed of clinical course, understands medical decision-making process, and agrees with plan.  Labs Reviewed - No data to display Ct Head Wo Contrast  02/02/2013  *RADIOLOGY REPORT*  Clinical Data: Headache.  CT HEAD WITHOUT CONTRAST  Technique:  Contiguous axial images were obtained from the base of the skull through the vertex without contrast.  Comparison: 07/30/2012.  Findings: The ventricles are normal.  No extra-axial fluid collections are seen.  The brainstem and cerebellum are unremarkable.  No acute intracranial findings such as infarction or hemorrhage.  No mass lesions.  The bony calvarium is intact. The visualized paranasal sinuses and mastoid air cells are clear except for left maxillary sinus disease.  IMPRESSION: No acute intracranial findings or mass lesions. Left maxillary sinus disease.   Original Report Authenticated By: Rudie Meyer, M.D.      No diagnosis found.    MDM  The chart was scribed for me under my direct supervision.  I personally performed the history, physical, and medical decision making and all procedures in the evaluation of this patient.Benny Lennert, MD 02/02/13 1950

## 2013-02-02 NOTE — ED Notes (Signed)
Pt states pain is improved, states headache is currently a 7/10. Pt resting in bed, AAx4, NAD noted. Family in room with patientt

## 2013-02-28 ENCOUNTER — Observation Stay (HOSPITAL_COMMUNITY)
Admission: EM | Admit: 2013-02-28 | Discharge: 2013-03-02 | Disposition: A | Discharge status: Home / Self Care | Payer: Medicare Other | Attending: Cardiology | Admitting: Cardiology

## 2013-02-28 ENCOUNTER — Encounter (HOSPITAL_COMMUNITY): Payer: Self-pay

## 2013-02-28 ENCOUNTER — Emergency Department (HOSPITAL_COMMUNITY): Payer: Medicare Other

## 2013-02-28 DIAGNOSIS — E7800 Pure hypercholesterolemia, unspecified: Secondary | ICD-10-CM | POA: Insufficient documentation

## 2013-02-28 DIAGNOSIS — R079 Chest pain, unspecified: Principal | ICD-10-CM | POA: Diagnosis present

## 2013-02-28 DIAGNOSIS — I10 Essential (primary) hypertension: Secondary | ICD-10-CM

## 2013-02-28 DIAGNOSIS — Z6841 Body Mass Index (BMI) 40.0 and over, adult: Secondary | ICD-10-CM | POA: Insufficient documentation

## 2013-02-28 DIAGNOSIS — E119 Type 2 diabetes mellitus without complications: Secondary | ICD-10-CM

## 2013-02-28 DIAGNOSIS — G4733 Obstructive sleep apnea (adult) (pediatric): Secondary | ICD-10-CM | POA: Insufficient documentation

## 2013-02-28 DIAGNOSIS — E785 Hyperlipidemia, unspecified: Secondary | ICD-10-CM | POA: Diagnosis present

## 2013-02-28 HISTORY — DX: Fibromyalgia: M79.7

## 2013-02-28 HISTORY — DX: Panic disorder (episodic paroxysmal anxiety): F41.0

## 2013-02-28 HISTORY — DX: Other specified postprocedural states: Z98.890

## 2013-02-28 HISTORY — DX: Obstructive sleep apnea (adult) (pediatric): G47.33

## 2013-02-28 LAB — CBC
HCT: 39.8 % (ref 36.0–46.0)
Hemoglobin: 13.6 g/dL (ref 12.0–15.0)
MCH: 30.4 pg (ref 26.0–34.0)
RBC: 4.47 MIL/uL (ref 3.87–5.11)

## 2013-02-28 LAB — COMPREHENSIVE METABOLIC PANEL
ALT: 30 U/L (ref 0–35)
Alkaline Phosphatase: 95 U/L (ref 39–117)
BUN: 16 mg/dL (ref 6–23)
CO2: 26 mEq/L (ref 19–32)
Calcium: 9.4 mg/dL (ref 8.4–10.5)
GFR calc Af Amer: >90 mL/min (ref >90)
GFR calc non Af Amer: >90 mL/min (ref >90)
Glucose, Bld: 108 mg/dL — ABNORMAL HIGH (ref 70–99)
Sodium: 139 mEq/L (ref 135–145)

## 2013-02-28 LAB — HEMOGLOBIN A1C
Hgb A1c MFr Bld: 5.9 % — ABNORMAL HIGH (ref <5.7)
Mean Plasma Glucose: 123 mg/dL — ABNORMAL HIGH (ref <117)

## 2013-02-28 LAB — LIPID PANEL
LDL Cholesterol: 57 mg/dL (ref 0–99)
Total CHOL/HDL Ratio: 3 RATIO
VLDL: 27 mg/dL (ref 0–40)

## 2013-02-28 LAB — GLUCOSE, CAPILLARY
Glucose-Capillary: 140 mg/dL — ABNORMAL HIGH (ref 70–99)
Glucose-Capillary: 86 mg/dL (ref 70–99)

## 2013-02-28 LAB — TROPONIN I: Troponin I: <0.3 ng/mL (ref <0.30)

## 2013-02-28 MED ORDER — SODIUM CHLORIDE 0.9 % IJ SOLN
3.0000 mL | Freq: Two times a day (BID) | INTRAMUSCULAR | Status: DC
  Administered 2013-02-28 – 2013-03-02 (×3): 3 mL via INTRAVENOUS

## 2013-02-28 MED ORDER — ENOXAPARIN SODIUM 40 MG/0.4ML ~~LOC~~ SOLN
40.0000 mg | Freq: Every day | SUBCUTANEOUS | Status: DC
  Administered 2013-02-28 – 2013-03-01 (×2): 40 mg via SUBCUTANEOUS
  Filled 2013-02-28 (×3): qty 0.4

## 2013-02-28 MED ORDER — ONDANSETRON HCL 4 MG/2ML IJ SOLN: 4.0000 mg | Freq: Three times a day (TID) | INTRAMUSCULAR | Status: AC | PRN

## 2013-02-28 MED ORDER — NITROGLYCERIN 0.4 MG SL SUBL
0.4000 mg | SUBLINGUAL_TABLET | SUBLINGUAL | Status: DC | PRN
  Administered 2013-02-28 (×2): 0.4 mg via SUBLINGUAL

## 2013-02-28 MED ORDER — ALBUTEROL SULFATE (5 MG/ML) 0.5% IN NEBU: 2.5000 mg | INHALATION_SOLUTION | RESPIRATORY_TRACT | Status: AC | PRN

## 2013-02-28 MED ORDER — ALUM & MAG HYDROXIDE-SIMETH 200-200-20 MG/5ML PO SUSP: 30.0000 mL | Freq: Four times a day (QID) | ORAL | Status: DC | PRN

## 2013-02-28 MED ORDER — HYDROMORPHONE HCL PF 1 MG/ML IJ SOLN
0.5000 mg | INTRAMUSCULAR | Status: AC | PRN
  Administered 2013-02-28 (×2): 1 mg via INTRAVENOUS
  Filled 2013-02-28 (×2): qty 1

## 2013-02-28 MED ORDER — INSULIN ASPART 100 UNIT/ML ~~LOC~~ SOLN
0.0000 [IU] | Freq: Three times a day (TID) | SUBCUTANEOUS | Status: DC
  Administered 2013-02-28 – 2013-03-01 (×2): 2 [IU] via SUBCUTANEOUS

## 2013-02-28 MED ORDER — ACETAMINOPHEN 325 MG PO TABS
650.0000 mg | ORAL_TABLET | Freq: Four times a day (QID) | ORAL | Status: DC | PRN
  Administered 2013-03-01 – 2013-03-02 (×2): 650 mg via ORAL
  Filled 2013-02-28 (×2): qty 2

## 2013-02-28 MED ORDER — ATORVASTATIN CALCIUM 10 MG PO TABS
10.0000 mg | ORAL_TABLET | Freq: Every day | ORAL | Status: DC
  Administered 2013-02-28 – 2013-03-01 (×2): 10 mg via ORAL
  Filled 2013-02-28 (×3): qty 1

## 2013-02-28 MED ORDER — SODIUM CHLORIDE 0.9 % IV BOLUS (SEPSIS)
500.0000 mL | Freq: Once | INTRAVENOUS | Status: AC
  Administered 2013-02-28: 500 mL via INTRAVENOUS

## 2013-02-28 MED ORDER — GLIMEPIRIDE 1 MG PO TABS
1.0000 mg | ORAL_TABLET | Freq: Every day | ORAL | Status: DC
  Administered 2013-02-28 – 2013-03-02 (×3): 1 mg via ORAL
  Filled 2013-02-28 (×3): qty 1

## 2013-02-28 MED ORDER — ACETAMINOPHEN 650 MG RE SUPP: 650.0000 mg | Freq: Four times a day (QID) | RECTAL | Status: DC | PRN

## 2013-02-28 MED ORDER — HYDROMORPHONE HCL PF 1 MG/ML IJ SOLN: 0.5000 mg | Freq: Once | INTRAMUSCULAR | Status: DC

## 2013-02-28 MED ORDER — DOCUSATE SODIUM 100 MG PO CAPS
100.0000 mg | ORAL_CAPSULE | Freq: Two times a day (BID) | ORAL | Status: DC
  Administered 2013-02-28 – 2013-03-02 (×5): 100 mg via ORAL
  Filled 2013-02-28 (×6): qty 1

## 2013-02-28 MED ORDER — SODIUM CHLORIDE 0.9 % IV SOLN: INTRAVENOUS | Status: DC

## 2013-02-28 MED ORDER — IMIPRAMINE HCL 50 MG PO TABS
50.0000 mg | ORAL_TABLET | Freq: Every day | ORAL | Status: DC
  Administered 2013-02-28 – 2013-03-01 (×2): 50 mg via ORAL
  Filled 2013-02-28: qty 1
  Filled 2013-02-28: qty 2
  Filled 2013-02-28: qty 1

## 2013-02-28 MED ORDER — FESOTERODINE FUMARATE ER 8 MG PO TB24
8.0000 mg | ORAL_TABLET | Freq: Every evening | ORAL | Status: DC
  Administered 2013-02-28 – 2013-03-01 (×2): 8 mg via ORAL
  Filled 2013-02-28 (×3): qty 1

## 2013-02-28 MED ORDER — METOPROLOL TARTRATE 50 MG PO TABS
50.0000 mg | ORAL_TABLET | Freq: Two times a day (BID) | ORAL | Status: DC
  Administered 2013-02-28 – 2013-03-02 (×5): 50 mg via ORAL
  Filled 2013-02-28 (×6): qty 1

## 2013-02-28 NOTE — ED Notes (Signed)
Blood pressure dropped after one ntg sl, pt not symptomatic, fluid bolus in process

## 2013-02-28 NOTE — ED Notes (Signed)
Ns bolus going due to bp. edp aware

## 2013-02-28 NOTE — ED Notes (Signed)
2nd ntg sl given

## 2013-02-28 NOTE — Progress Notes (Signed)
UR Chart Review Completed  

## 2013-02-28 NOTE — ED Provider Notes (Signed)
History     CSN: [id number]  Arrival date & time 02/28/13  4098   First MD Initiated Contact with Patient 02/28/13 [phone]      Chief Complaint  Patient presents with  . Chest Pain    (Consider location/radiation/quality/duration/timing/severity/associated sxs/prior treatment) HPI [name] is a 57 y.o. female with past medical history significant for diabetes, hypertension, asthma, panic attacks and hypercholesterolemia presents with left-sided chest pain that is described as 10 out of 10, pressure, nonradiating, not associated with nausea vomiting, shortness of breath, diaphoresis or dizziness. Patient awoke this morning and went to urinate, but she was hit by acute onset of this left-sided chest pain. Pain is worse on deep inspiration. She denies any history of venous thromboembolic disease. Denies hemoptysis, denies estrogen therapy. No history of recent immobilization, cancer treatment or long bone fracture or recent surgeries. Family history pertinent for mother passing away at age 73 from MI. No recent illness, no productive cough. Patient has a history of asthma.  Patient has had prior panic attacks but says this feels different. Pain is also worse to touching the left-sided chest.     Past Medical History  Diagnosis Date  . Diabetes mellitus   . Hypertension   . Asthma   . Overactive bladder   . High cholesterol   . Ankle fracture     required surgery   . Fibromyalgia   . Panic attacks     Past Surgical History  Procedure Laterality Date  . Abdominal hysterectomy    . Knee replacements      bilateral  . Back surgery    . Tonsillectomy    . Carpal tunnel release      History reviewed. No pertinent family history.  History  Substance Use Topics  . Smoking status: Former Games developer  . Smokeless tobacco: Not on file  . Alcohol Use: No    OB History   Grav Para Term Preterm Abortions TAB SAB Ect Mult Living                  Review of Systems At least 10pt  or greater review of systems completed and are negative except where specified in the HPI.  Allergies  Aspirin and Codeine  Home Medications   Current Outpatient Rx  Name  Route  Sig  Dispense  Refill  . albuterol (PROVENTIL HFA;VENTOLIN HFA) 108 (90 BASE) MCG/ACT inhaler   Inhalation   Inhale 2 puffs into the lungs every 4 (four) hours as needed. Shortness of Breath          . atorvastatin (LIPITOR) 10 MG tablet   Oral   Take 10 mg by mouth at bedtime.         Marland Kitchen Fesoterodine Fumarate (TOVIAZ) 8 MG TB24   Oral   Take 8 mg by mouth every evening.          Marland Kitchen glimepiride (AMARYL) 2 MG tablet   Oral   Take 1 mg by mouth daily before lunch.         . imipramine (TOFRANIL) 50 MG tablet   Oral   Take 50 mg by mouth at bedtime.           . metoprolol (LOPRESSOR) 50 MG tablet   Oral   Take 50 mg by mouth 2 (two) times daily.           Marland Kitchen oxyCODONE-acetaminophen (PERCOCET/ROXICET) 5-325 MG per tablet   Oral   Take 1 tablet by mouth every  6 (six) hours as needed for pain.   12 tablet   0     BP 106/87  Pulse 66  Temp(Src) 97.9 F (36.6 C) (Oral)  Resp 22  Ht 5\' 7"  (1.702 m)  Wt 285 lb (129.275 kg)  BMI 44.63 kg/m2  SpO2 96%  Physical Exam  Nursing notes reviewed.  Electronic medical record reviewed. VITAL SIGNS:   Filed Vitals:   02/28/13 0620 02/28/13 0630 02/28/13 0639 02/28/13 0647  BP: 106/87 104/44 110/69 95/55  Pulse: 66 66 64 73  Temp: 97.9 F (36.6 C)     TempSrc: Oral     Resp: 22 12 25 18   Height: 5\' 7"  (1.702 m)     Weight: 285 lb (129.275 kg)     SpO2: 96% 98% 97% 96%   CONSTITUTIONAL: Awake, orientedx4, appears non-toxic but anxious and uncomfortable HENT: Atraumatic, normocephalic, oral mucosa pink and moist, airway patent. Nares patent without drainage. External ears normal. EYES: Conjunctiva clear, EOMI, PERRLA NECK: Trachea midline, non-tender, supple CARDIOVASCULAR: Normal heart rate, Normal rhythm, No murmurs, rubs,  gallops PULMONARY/CHEST: Clear to auscultation, no rhonchi, wheezes, or rales. Symmetrical breath sounds. Left sided chest wall tender to palpation. ABDOMINAL: Non-distended, morbidly obese, soft, non-tender - no rebound or guarding.  BS normal. NEUROLOGIC: Non-focal, moving all four extremities, no gross sensory or motor deficits. EXTREMITIES: No clubbing, cyanosis, or edema, spider veins, knee replacements SKIN: Warm, Dry, No erythema, No rash  ED Course  Procedures (including critical care time)  Date: 02/28/2013  Rate: 60  Rhythm: normal sinus rhythm  QRS Axis: normal  Intervals: normal  ST/T Wave abnormalities: normal  Conduction Disutrbances: none  Narrative Interpretation: Low-voltage QRS, otherwise unremarkable EKG    Labs Reviewed  COMPREHENSIVE METABOLIC PANEL - Abnormal; Notable for the following:    Glucose, Bld 108 (*)    All other components within normal limits  CBC  TROPONIN I  D-DIMER, QUANTITATIVE   Dg Chest Portable 1 View  02/28/2013  *RADIOLOGY REPORT*  Clinical Data: Chest pain and hypertension.  PORTABLE CHEST - 1 VIEW  Comparison: 01/06/2013  Findings: Slightly shallow inspiration.  Normal heart size and pulmonary vascularity.  No focal consolidation or airspace disease in the lungs.  No blunting of costophrenic angles.  No pneumothorax.  Mediastinal contours appear intact.  No significant change since previous study.  IMPRESSION: No evidence of active pulmonary disease.   Original Report Authenticated By: Burman Nieves, M.D.      1. Chest pain   2. Morbid obesity   3. Hypertension   4. DM type 2 (diabetes mellitus, type 2)       MDM  [name] is a 57 y.o. female presents with left-sided chest pain. Patient has concerning history of diabetes, hypertension, high cholesterol. Patient had a false positive nuclear stress test performed at 2006 with subsequent catheterization showing normal coronary arteries and normal left ventricular systolic  function with systemic hypertension.  Patient has not had any cardiac workup since then. Patient's pain is reproducible suggestive of chest wall pain versus anxiety however, patient is not low risk.  We'll obtain basic labs, cardiac biomarkers and a d-dimer as well as chest x-ray to further risk stratify the patient, and patient will require inpatient cardiac rule out.  EKG is unremarkable, troponin is negative, d-dimer is also negative. Labs are otherwise unremarkable. Chest x-ray is unremarkable.  Patient had a couple of hypotensive episodes following nitroglycerin but responded well to normal saline fluid boluses.  Patient's pain  did get better with nitroglycerin from about 8-9 to -6 of 10.  Discussed with Dr. Karilyn Cota for admission.        Jones Skene, MD 02/28/13 [phone]

## 2013-02-28 NOTE — H&P (Signed)
[facility] History and Physical  [name] ZOX:[id number] DOB: 11-24-1956 DOA: 02/28/2013  Referring physician:  PCP: Dwana Melena, MD  Specialists:   Chief Complaint: chest pain  HPI: [name] is a 57 y.o. female with past medical hx including morbid obesity, HTN, diabetes, obstructive sleep apnea, asthma, panic attacks who presents to ED with cc chest pain. Information obtained from patient. She reports having intermittent sharp, mild chest pain yesterday that "felt like indigestion" located left anterior chest. This morning around 3am pt awakened with sudden, "heavy, pressure" located left anterior chest. She states the pain was constant, rated a 10/10, relieved by nothing. She waited until 5am when husband insisted she go to ED but patient could not ambulate due to pain so EMS called. Pt states she received NTG in ed with some relief. Denies any radiation of pain, denies sob, nausea/vomiting, abdominal pain, palpitation. Denies any recent illness fever, chills. Denies headache, dizziness, numbness tingling of extremities. In ED EKG unremarkable, troponin neg, d-dimer neg. Pt did experience episode of hypotension following NTG but resolved with IV fluids. Symptoms came on suddenly have persisted, characterized as severe. NTG makes better, nothing makes worse. We are asked to admit.    Review of Systems: The patient denies anorexia, fever, weight loss,, vision loss, decreased hearing, hoarseness, syncope, dyspnea on exertion, peripheral edema, balance deficits, hemoptysis, abdominal pain, melena, hematochezia,  hematuria, incontinence, genital sores, muscle weakness, suspicious skin lesions, transient blindness, difficulty walking, depression, unusual weight change, abnormal bleeding, enlarged lymph nodes, angioedema, and breast masses.    Past Medical History  Diagnosis Date  . Diabetes mellitus   . Hypertension   . Asthma   . Overactive bladder   . High cholesterol   .  Ankle fracture     required surgery   . Fibromyalgia   . Panic attacks   . Obstructive sleep apnea    Past Surgical History  Procedure Laterality Date  . Abdominal hysterectomy    . Knee replacements      bilateral  . Back surgery    . Tonsillectomy    . Carpal tunnel release     Social History:  reports that she has quit smoking when she was teenager. She does not have any smokeless tobacco history on file. She reports that she does not drink alcohol or use illicit drugs. She is married lives with husband. Has 2 grown children. Is disabled nurse tech/beautician  Allergies  Allergen Reactions  . Aspirin Nausea And Vomiting  . Codeine Rash    History reviewed. No pertinent family history. Mother deceased 25 MI. Biological father unknown. 4 brothers whose collective medical hx positive for HTN, DM, CAD, MI, stroke  Prior to Admission medications   Medication Sig Start Date End Date Taking? Authorizing Provider  albuterol (PROVENTIL HFA;VENTOLIN HFA) 108 (90 BASE) MCG/ACT inhaler Inhale 2 puffs into the lungs every 4 (four) hours as needed. Shortness of Breath    Yes Historical Provider, MD  atorvastatin (LIPITOR) 10 MG tablet Take 10 mg by mouth at bedtime.   Yes Historical Provider, MD  Fesoterodine Fumarate (TOVIAZ) 8 MG TB24 Take 8 mg by mouth every evening.    Yes Historical Provider, MD  glimepiride (AMARYL) 2 MG tablet Take 1 mg by mouth daily before lunch.   Yes Historical Provider, MD  imipramine (TOFRANIL) 50 MG tablet Take 50 mg by mouth at bedtime.     Yes Historical Provider, MD  metoprolol (LOPRESSOR) 50 MG tablet Take 50 mg by  mouth 2 (two) times daily.     Yes Historical Provider, MD  oxyCODONE-acetaminophen (PERCOCET/ROXICET) 5-325 MG per tablet Take 1 tablet by mouth every 6 (six) hours as needed for pain. 02/02/13   Benny Lennert, MD   Physical Exam: Filed Vitals:   02/28/13 0720 02/28/13 0730 02/28/13 0745 02/28/13 0926  BP: 86/61 98/63 115/73 100/72  Pulse:  69 64 66 59  Temp:    98.4 F (36.9 C)  TempSrc:    Oral  Resp:  16 19   Height:    5\' 7"  (1.702 m)  Weight:      SpO2:  95% 96% 98%     General:  Morbidly obese, alert NAD  Eyes: PERRL EOMI no scleral icterus, o  ENT: oropharynx without exudate, pink/ moist, ears clear, nose without drainage  Neck: supple full rom no lymphadenopathy.   Cardiovascular: RRR No MGR No LEE PPP. Chest tender to palpation on left anterior side.   Respiratory: normal effort BSCTAB no wheeze/rhonchi no crackles.  Abdomen: morbidly obese, non-distended, soft, +BS mild tenderness upper left quadrant and epigastric area. No mass organomegaly  Skin: warm dry no rash or lesion  Musculoskeletal: moves all extremities, joints without erythema/ swelling. Bilateral healed scars from total knee.   Psychiatric: calm, cooperative appropriate  Neurologic: cranial nerve II-XII intact speech clear facial symmetry  Labs on Admission:  Basic Metabolic Panel:  Recent Labs Lab 02/28/13 0630  NA 139  K 4.4  CL 104  CO2 26  GLUCOSE 108*  BUN 16  CREATININE 0.77  CALCIUM 9.4   Liver Function Tests:  Recent Labs Lab 02/28/13 0630  AST 33  ALT 30  ALKPHOS 95  BILITOT 0.5  PROT 6.7  ALBUMIN 3.7   No results found for this basename: LIPASE, AMYLASE,  in the last 168 hours No results found for this basename: AMMONIA,  in the last 168 hours CBC:  Recent Labs Lab 02/28/13 0630  WBC 4.2  HGB 13.6  HCT 39.8  MCV 89.0  PLT 182   Cardiac Enzymes:  Recent Labs Lab 02/28/13 0630  TROPONINI <0.30    BNP (last 3 results) No results found for this basename: PROBNP,  in the last 8760 hours CBG: No results found for this basename: GLUCAP,  in the last 168 hours  Radiological Exams on Admission: Dg Chest Portable 1 View  02/28/2013  *RADIOLOGY REPORT*  Clinical Data: Chest pain and hypertension.  PORTABLE CHEST - 1 VIEW  Comparison: 01/06/2013  Findings: Slightly shallow inspiration.  Normal  heart size and pulmonary vascularity.  No focal consolidation or airspace disease in the lungs.  No blunting of costophrenic angles.  No pneumothorax.  Mediastinal contours appear intact.  No significant change since previous study.  IMPRESSION: No evidence of active pulmonary disease.   Original Report Authenticated By: Burman Nieves, M.D.     EKG: Independently reviewed. NSR  Assessment/Plan Principal Problem:   Chest pain: atypical but concern for ACS. Will admit to tele. Will cycle CE, check lipid panel, HgA1c, provide oxygen support, provide pain med. Repeat EKG in am. Pt with several risk factors. Pain is reproduced with palpation.  Will request cardiology consult.  Active Problems:   HTN: had 1 episode of hypotension after NTG in ED. Currently stable. Will continue BB with parameters    Diabetes: pt does not monitor bld sugar at home. Will continue home oral med, will check A1c will use SSI for glycemic control. Carb modified diet.  Morbid obesity: BMI 44.7. Nutritional consult    Hyperlipidemia: will check lipid panel. Continue home lipitor  Obstructive sleep apnea: pt non-compliant with CPAP at home. Will request respiratory to see.     [place] [facility]  Code Status: full Family Communication: husband at bedside Disposition Plan: home when ready  Time spent: 65 minutes  [facility]   If 7PM-7AM, please contact night-coverage [email or web address] Password [facility] 02/28/2013, 9:40 AM Attending: Patient seen and examined. Above note reviewed. She clearly is tender in the chest wall, however according to the patient, this does not reproduce the pain that she describes. She has multiple risk factors for coronary artery disease. Initial troponin level and ECG are entirely normal. I think she will likely benefit from cardiac catheterization.

## 2013-02-28 NOTE — ED Notes (Signed)
Pain down to 6 from 8 with 2nd ntg.

## 2013-02-28 NOTE — Progress Notes (Signed)
Went in to start cpap on patient she states she does not wear one at home he gets her hot and she cant tolerate it.

## 2013-02-28 NOTE — ED Notes (Signed)
Floor unable to take report at this time.pt in nad.

## 2013-03-01 ENCOUNTER — Encounter (HOSPITAL_COMMUNITY): Payer: Self-pay | Admitting: Adult Health

## 2013-03-01 LAB — GLUCOSE, CAPILLARY: Glucose-Capillary: 103 mg/dL — ABNORMAL HIGH (ref 70–99)

## 2013-03-01 LAB — BASIC METABOLIC PANEL
BUN: 14 mg/dL (ref 6–23)
Creatinine, Ser: 0.91 mg/dL (ref 0.50–1.10)
GFR calc non Af Amer: 69 mL/min — ABNORMAL LOW (ref >90)
Glucose, Bld: 110 mg/dL — ABNORMAL HIGH (ref 70–99)
Potassium: 4 mEq/L (ref 3.5–5.1)

## 2013-03-01 LAB — CBC
Hemoglobin: 14.7 g/dL (ref 12.0–15.0)
MCH: 30.1 pg (ref 26.0–34.0)
MCHC: 33.7 g/dL (ref 30.0–36.0)

## 2013-03-01 MED ORDER — SODIUM CHLORIDE 0.9 % IJ SOLN: 3.0000 mL | INTRAMUSCULAR | Status: DC | PRN

## 2013-03-01 MED ORDER — SODIUM CHLORIDE 0.9 % IV SOLN
1.0000 mL/kg/h | INTRAVENOUS | Status: DC
  Administered 2013-03-02: 1 mL/kg/h via INTRAVENOUS

## 2013-03-01 MED ORDER — DIAZEPAM 5 MG PO TABS
5.0000 mg | ORAL_TABLET | ORAL | Status: AC
Start: 2013-03-02 — End: 2013-03-02
  Administered 2013-03-02: 5 mg via ORAL
  Filled 2013-03-01: qty 1

## 2013-03-01 MED ORDER — SODIUM CHLORIDE 0.9 % IV SOLN: 250.0000 mL | INTRAVENOUS | Status: DC | PRN

## 2013-03-01 MED ORDER — NITROGLYCERIN 2 % TD OINT
0.5000 [in_us] | TOPICAL_OINTMENT | Freq: Four times a day (QID) | TRANSDERMAL | Status: DC
  Administered 2013-03-01 (×2): 0.5 [in_us] via TOPICAL
  Filled 2013-03-01: qty 30
  Filled 2013-03-01: qty 1

## 2013-03-01 MED ORDER — SODIUM CHLORIDE 0.9 % IJ SOLN
3.0000 mL | Freq: Two times a day (BID) | INTRAMUSCULAR | Status: DC
  Administered 2013-03-01: 3 mL via INTRAVENOUS

## 2013-03-01 NOTE — Consult Note (Signed)
CARDIOLOGY CONSULT NOTE  [name] ID: [name] MRN: [id number] DOB/AGE: 05-03-1956 57 y.o.  Admit date: 02/28/2013 Referring Physician: PTH Primary Dierdre Forth, MD Primary Cardiologist: (Formerly Dorethea Clan) Dietrich Pates Reason for Consultation:Chest Pain Principal Problem:   Chest pain, unspecified Active Problems:   HIGH BLOOD PRESSURE   Diabetes   Morbid obesity   Other and unspecified hyperlipidemia  HPI: [name] is an obese 57 year old female [name] who was admitted for chest pain  The [name] described as sharp-heaviness in the center of [name] chest, nonradiating without associated shortness of breath, dizziness, diaphoresis, or nausea. which awakened [name] around 3 AM the morning of admission, constant  until arrival to ER 3 hours later. [name] waited until [name] husband awoke at 5 AM to report this. [name] did not take any medication for relief, but stayed in the bed waiting for husband to wake up.  As the [name] tried to get up at the pain worsened and [name] fell to [name] knees do to the severity. On arrival to ER the [name] was provided with sublingual nitroglycerin, x 2 with relief of pain. EKG on arrival demonstrated normal sinus rhythm without evidence of ACS. However, EKG this am demonstrates T-wave abnormalities noted inferiorly leads III and aVF with T-wave inversion and concavity noted  Cardiac enzymes have been cycled and found to be negative x4. D-dimer mildly elevated at less than 0.27. [name] has multiple cardiovascular risk factors and we are asked for cardiac recommendations.       [name]'s prior history includes diabetes, asthma, hypercholesterolemia, obstructive sleep apnea (noncompliance with CPAP), and morbid obesity. [name] has been seen by cardiology in the past in 2006 with a stress Myoview which was found to be abnormal concerning breast attenuation and body habitus. "However a significant left anterior descending coronary artery lesion could not be excluded and clinical  correlation was therefore advised."  [name] did not followup with cardiology after stress test. [name] states [name] had no recurrence of chest pain since that time.  Review of systems complete and found to be negative unless listed above   Past Medical History  Diagnosis Date  . Diabetes mellitus   . Hypertension   . Asthma   . Overactive bladder   . High cholesterol   . Ankle fracture     required surgery   . Fibromyalgia   . Panic attacks   . Obstructive sleep apnea     Noncompliant with CPAP    Family History  Problem Relation Age of Onset  . Heart attack Mother   . Sick sinus syndrome Brother     Pacemaker  . Congenital heart disease Brother     CABG    History   Social History  . Marital Status: Married    Spouse Name: N/A    Number of Children: N/A  . Years of Education: N/A   Occupational History  . Not on file.   Social History Main Topics  . Smoking status: Former Games developer  . Smokeless tobacco: Not on file  . Alcohol Use: No  . Drug Use: No  . Sexually Active: Yes    Birth Control/ Protection: Surgical   Other Topics Concern  . Not on file   Social History Narrative  . No narrative on file    Past Surgical History  Procedure Laterality Date  . Abdominal hysterectomy    . Knee replacements      bilateral  . Back surgery    . Tonsillectomy    . Carpal tunnel  release       Prescriptions prior to admission  Medication Sig Dispense Refill  . albuterol (PROVENTIL HFA;VENTOLIN HFA) 108 (90 BASE) MCG/ACT inhaler Inhale 2 puffs into the lungs every 4 (four) hours as needed. Shortness of Breath       . atorvastatin (LIPITOR) 10 MG tablet Take 10 mg by mouth at bedtime.      Marland Kitchen Fesoterodine Fumarate (TOVIAZ) 8 MG TB24 Take 8 mg by mouth every evening.       Marland Kitchen glimepiride (AMARYL) 2 MG tablet Take 1 mg by mouth daily before lunch.      . imipramine (TOFRANIL) 50 MG tablet Take 50 mg by mouth at bedtime.        . metoprolol (LOPRESSOR) 50 MG tablet Take 50 mg  by mouth 2 (two) times daily.          Physical Exam: Blood pressure 136/79, pulse 56, temperature 97.2 F (36.2 C), temperature source Oral, resp. rate 20, height 5\' 7"  (1.702 m), weight 285 lb (129.275 kg), SpO2 98.00%.   General: Well developed, well nourished, in no acute distress, morbidly obese. Head: Eyes PERRLA, No xanthomas.   Normal cephalic and atramatic  Lungs: Clear bilaterally to auscultation , with scan inspiratory wheezes, cleared with cough. Heart: HRRR S1 S2, without MRG.  Pulses are 2+ & equal.            No carotid bruit. No JVD.  No abdominal bruits. No femoral bruits. Abdomen: Bowel sounds are positive, abdomen soft and non-tender without masses or                  Hernia's noted. Msk:  Back normal, normal gait. Normal strength and tone for age. Extremities: No clubbing, cyanosis or edema. Significant varicosities in the calves and ankles  DP +1 Neuro: Alert and oriented X 3. Psych:  Good affect, responds appropriately  Labs:   Lab Results  Component Value Date   WBC 5.8 03/01/2013   HGB 14.7 03/01/2013   HCT 43.6 03/01/2013   MCV 89.3 03/01/2013   PLT 200 03/01/2013     Recent Labs Lab 02/28/13 0630 03/01/13 0327  NA 139 139  K 4.4 4.0  CL 104 102  CO2 26 29  BUN 16 14  CREATININE 0.77 0.91  CALCIUM 9.4 9.3  PROT 6.7  --   BILITOT 0.5  --   ALKPHOS 95  --   ALT 30  --   AST 33  --   GLUCOSE 108* 110*   Lab Results  Component Value Date   CKTOTAL 125 07/05/2009   CKMB 2.2 07/05/2009   TROPONINI <0.30 03/01/2013    Lab Results  Component Value Date   CHOL 125 02/28/2013   CHOL  Value: 143        ATP III CLASSIFICATION:  <200     mg/dL   Desirable  161-096  mg/dL   Borderline High  >=045    mg/dL   High        03/21/8118   CHOL  Value: 113        ATP III CLASSIFICATION:  <200     mg/dL   Desirable  147-829  mg/dL   Borderline High  >=562    mg/dL   High 12/16/863   Lab Results  Component Value Date   HDL 41 02/28/2013   HDL 38* 07/06/2009   HDL  36* 08/16/2007   Lab Results  Component Value Date   LDLCALC 57  02/28/2013   LDLCALC  Value: 76        Total Cholesterol/HDL:CHD Risk Coronary Heart Disease Risk Table                     Men   Women  1/2 Average Risk   3.4   3.3  Average Risk       5.0   4.4  2 X Average Risk   9.6   7.1  3 X Average Risk  23.4   11.0        Use the calculated [name] Ratio above and the CHD Risk Table to determine the [name]'s CHD Risk.        ATP III CLASSIFICATION (LDL):  <100     mg/dL   Optimal  295-621  mg/dL   Near or Above                    Optimal  130-159  mg/dL   Borderline  308-657  mg/dL   High  >846     mg/dL   Very High 9/62/9528   LDLCALC  Value: 51        Total Cholesterol/HDL:CHD Risk Coronary Heart Disease Risk Table                     Men   Women  1/2 Average Risk   3.4   3.3 08/16/2007   Lab Results  Component Value Date   TRIG 134 02/28/2013   TRIG 145 07/06/2009   TRIG 132 08/16/2007   Lab Results  Component Value Date   CHOLHDL 3.0 02/28/2013   CHOLHDL 3.8 07/06/2009   CHOLHDL 3.1 08/16/2007   No results found for this basename: LDLDIRECT    Nuclear Stress Myoview 09/08/2005 Impression  This is an abnormal perfusion scan with significant breast attenuation  likely due to the [name]'s body habitus. The wall motion of the anterior  wall appears to be normal on the post stress images and the ejection  fraction is normal as well. However, a significant left anterior  descending coronary artery lesion cannot be excluded and clinical  correlation is therefore advised.   Radiology: Dg Chest Portable 1 View  02/28/2013  *RADIOLOGY REPORT*  Clinical Data: Chest pain and hypertension.  PORTABLE CHEST - 1 VIEW  Comparison: 01/06/2013  Findings: Slightly shallow inspiration.  Normal heart size and pulmonary vascularity.  No focal consolidation or airspace disease in the lungs.  No blunting of costophrenic angles.  No pneumothorax.  Mediastinal contours appear intact.  No significant change since  previous study.  IMPRESSION: No evidence of active pulmonary disease.   Original Report Authenticated By: Burman Nieves, M.D.    EKG:NSR rate of 54 with inferior T-wave abnormalities, Inversion in III and nonspecific changes in aVF, and V3.  ASSESSMENT AND PLAN:  1. Chest pain: Worrisome for unstable angina in a [name] with multiple cardiovascular risk factors to include hypertension, hypercholesterolemia,diabetes, family history, obesity. Cardiac enzymes are negative x4 but there are some T-wave abnormalities noted in the inferior leads which are new compared to initial EKG on admission ER 24 hours earlier. [name] was awakened by this pain at 3 AM, lasting approximately 2 and half hours in duration. [name] has had one episode of recurrent discomfort in [name] chest relieved with morphine and nitroglycerin. This also occurred at rest. Recommend cardiac catheterization vs. repeat stress Myoview. I would error on the side of cardiac catheterization in the setting of [name]  symptoms although cardiac enzymes are negative. Will discuss further with Dr. Dietrich Pates. [name]'s weight is 285 pounds, and can be accommodated in cath lab.  Cannot add aspirin, and [name] is allergic to this. Review of home meds has  [name] on metoprolol 50 mg twice a day which is continued on admission.  2. Hypertension: Pressure is well-controlled. [name] is not on ACE inhibitor at home, which is recommended for diabetic patients. Creatinine your 0.77. Lisinopril 2.5 mg daily is recommended.  3. Hypercholesterolemia: [name] remains on statin with the studies completed during this admission. LDL is 57 with an HDL of 41. Total cholesterol 125. Well-controlled currently.  4. Morbid obesity: Significant weight loss is recommended with reduced calorie intake and increased activity.  5. Diabetes: Hemoglobin A1c 5.9. Continue medical management per primary care.  6. Obstructive sleep apnea: [name] is noncompliant with CPAP due to claustrophobia. Alternate  measures should be addressed by primary care.  Signed: Bettey Mare. Lyman Bishop NP [facility] 03/01/2013, 9:47 AM  Addendum: Reviewed case with Dr. Dietrich Pates. [name] has looked at [facility]. Plan to transfer to [facility] for cardiac cath in am. I have discussed this with the [name] including risks, benefits, and procedure details. [name] verbalizes understanding and is willing to be transferred for cardiac cath this afternoon, for cath in AM. Dr. [place] will be preforming cardiac cath. I have placed NTG paste 1/2 inch to chest wall.  I have informed Dr.Gosrani, attending.  Cardiology Attending [name] discussed with Joni Reining, NP.  Above note annotated and modified based upon my findings.  [name] has multiple cardiovascular risk factors with recurrent chest discomfort including subsequent to admission and initial treatment. While negative cardiac markers, no acute EKG changes during chest discomfort and a benign examination are somewhat reassuring, the development of inferior T-wave inversion, albeit a nonspecific finding, the likelihood of a nondiagnostic noninvasive test result and the likelihood of continuing chest discomfort with subsequent hospital admissions have prompted me to recommend cardiac catheterization as the initial test, which the [name] accepts. Transfer to [facility] has been arranged.   Salisbury Bing, MD 03/01/2013, 4:35 PM

## 2013-03-01 NOTE — Progress Notes (Signed)
[facility] PROGRESS NOTE  DORIANNE PERRET AVW:[id number] DOB: May 19, 1956 DOA: 02/28/2013 PCP: Dwana Melena, MD  Assessment/Plan: Chest pain: atypical but concern for ACS. Only one episode recurrent CP relieved with morphine and NTG. Apprecieate cardiology assistance.  CE neg x4. Some T-wave changes EKG per cards.  Pt with several risk factors and pain occurs at rest.  Await cards recommendation.  Active Problems:  HTN: controlled.  Will continue BB. Will continue to monitor. Anticipate initiating ACE inhibitor once SBP reaches baseline.   Diabetes: CBG range 84-140. Will continue home oral med. A1c 5.9. Continue carb modified diet.   Morbid obesity: BMI 44.7. Nutritional consult. Counseled regarding weight loss   Hyperlipidemia:  Lipid panel within normal limits   Obstructive sleep apnea: pt non-compliant with CPAP at home. Refusing CPAP here as well.      Code Status: full Family Communication:  Disposition Plan: home when ready   Consultants:  Cardiology [place]  Procedures:  none  Antibiotics:  none  HPI/Subjective: Awake alert NAD denies pain/discomfort  Objective: Filed Vitals:   02/28/13 1046 02/28/13 1340 02/28/13 2014 03/01/13 0612  BP: 112/68 91/44 115/64 136/79  Pulse: 69 61 62 56  Temp:  97.8 F (36.6 C) 97 F (36.1 C) 97.2 F (36.2 C)  TempSrc:  Oral    Resp:  20 20 20   Height:      Weight:      SpO2:  98% 96% 98%    Intake/Output Summary (Last 24 hours) at 03/01/13 1241 Last data filed at 03/01/13 0915  Gross per 24 hour  Intake    480 ml  Output   1300 ml  Net   -820 ml   Filed Weights   02/28/13 8295  Weight: 129.275 kg (285 lb)    Exam:   General:  Obese NAD  Cardiovascular: RRR No MGR No clubbing /cyanosis  Respiratory: normal effort BS clear bilaterally no rhonchi/wheeze  Abdomen: obese soft +BS non-tender  Musculoskeletal: moves all extremities. Full rom.    Data Reviewed: Basic Metabolic Panel:  Recent  Labs Lab 02/28/13 0630 03/01/13 0327  NA 139 139  K 4.4 4.0  CL 104 102  CO2 26 29  GLUCOSE 108* 110*  BUN 16 14  CREATININE 0.77 0.91  CALCIUM 9.4 9.3   Liver Function Tests:  Recent Labs Lab 02/28/13 0630  AST 33  ALT 30  ALKPHOS 95  BILITOT 0.5  PROT 6.7  ALBUMIN 3.7   No results found for this basename: LIPASE, AMYLASE,  in the last 168 hours No results found for this basename: AMMONIA,  in the last 168 hours CBC:  Recent Labs Lab 02/28/13 0630 03/01/13 0327  WBC 4.2 5.8  HGB 13.6 14.7  HCT 39.8 43.6  MCV 89.0 89.3  PLT 182 200   Cardiac Enzymes:  Recent Labs Lab 02/28/13 0630 02/28/13 1515 02/28/13 2120 03/01/13 0326  TROPONINI <0.30 <0.30 <0.30 <0.30   BNP (last 3 results) No results found for this basename: PROBNP,  in the last 8760 hours CBG:  Recent Labs Lab 02/28/13 1216 02/28/13 1700 02/28/13 2036 03/01/13 0728 03/01/13 1140  GLUCAP 140* 86 101* 83 87    No results found for this or any previous visit (from the past 240 hour(s)).   Studies: Dg Chest Portable 1 View  02/28/2013  *RADIOLOGY REPORT*  Clinical Data: Chest pain and hypertension.  PORTABLE CHEST - 1 VIEW  Comparison: 01/06/2013  Findings: Slightly shallow inspiration.  Normal heart size and pulmonary vascularity.  No focal consolidation or airspace disease in the lungs.  No blunting of costophrenic angles.  No pneumothorax.  Mediastinal contours appear intact.  No significant change since previous study.  IMPRESSION: No evidence of active pulmonary disease.   Original Report Authenticated By: Burman Nieves, M.D.     Scheduled Meds: . atorvastatin  10 mg Oral QHS  . docusate sodium  100 mg Oral BID  . enoxaparin (LOVENOX) injection  40 mg Subcutaneous Daily  . fesoterodine  8 mg Oral QPM  . glimepiride  1 mg Oral QAC lunch  . imipramine  50 mg Oral QHS  . insulin aspart  0-15 Units Subcutaneous TID WC  . metoprolol  50 mg Oral BID  . sodium chloride  3 mL  Intravenous Q12H   Continuous Infusions:   Principal Problem:   Chest pain, unspecified Active Problems:   HIGH BLOOD PRESSURE   Diabetes   Morbid obesity   Other and unspecified hyperlipidemia    Time spent: 30 minutes    [facility] M  [facility]  If 7PM-7AM, please contact night-coverage at [email or web address], password [facility] 03/01/2013, 12:41 PM  LOS: 1 day   Attending: Patient is chest pain-free. Multiple risk factors. There are some T wave changes this morning. I think she will warrant cardiac catheterization. Await cardiology consultation and further recommendations.

## 2013-03-01 NOTE — Plan of Care (Signed)
Problem: Phase I Progression Outcomes Goal: Aspirin unless contraindicated Outcome: Not Applicable Date Met:  03/01/13 Allergy to Aspirin

## 2013-03-01 NOTE — Progress Notes (Addendum)
Patient with orders to be transferred to [facility]. Report called to Marylu Lund, [place]. Report given to care link. Patient in stable condition upon transfer. Patient transported via Care link.

## 2013-03-02 ENCOUNTER — Ambulatory Visit (HOSPITAL_COMMUNITY): Admit: 2013-03-02 | Payer: Self-pay | Admitting: Cardiology

## 2013-03-02 ENCOUNTER — Encounter (HOSPITAL_COMMUNITY): Payer: Self-pay | Admitting: Physician Assistant

## 2013-03-02 ENCOUNTER — Encounter (HOSPITAL_COMMUNITY)
Admission: EM | Disposition: A | Discharge status: Home / Self Care | Payer: Self-pay | Source: Home / Self Care | Attending: Emergency Medicine

## 2013-03-02 DIAGNOSIS — R079 Chest pain, unspecified: Secondary | ICD-10-CM

## 2013-03-02 HISTORY — PX: LEFT HEART CATHETERIZATION WITH CORONARY ANGIOGRAM: SHX5451

## 2013-03-02 LAB — PROTIME-INR: INR: 1.07 (ref 0.00–1.49)

## 2013-03-02 LAB — GLUCOSE, CAPILLARY
Glucose-Capillary: 78 mg/dL (ref 70–99)
Glucose-Capillary: 124 mg/dL — ABNORMAL HIGH (ref 70–99)

## 2013-03-02 SURGERY — LEFT HEART CATHETERIZATION WITH CORONARY ANGIOGRAM
Anesthesia: LOCAL

## 2013-03-02 MED ORDER — OMEPRAZOLE 20 MG PO CPDR: 20.0000 mg | DELAYED_RELEASE_CAPSULE | Freq: Every day | ORAL | Status: DC

## 2013-03-02 MED ORDER — SODIUM CHLORIDE 0.9 % IV SOLN
1.0000 mL/kg/h | INTRAVENOUS | Status: DC
  Administered 2013-03-02: 1 mL/kg/h via INTRAVENOUS

## 2013-03-02 MED ORDER — LIDOCAINE HCL (PF) 1 % IJ SOLN
INTRAMUSCULAR | Status: AC
  Filled 2013-03-02: qty 30

## 2013-03-02 MED ORDER — HEPARIN (PORCINE) IN NACL 2-0.9 UNIT/ML-% IJ SOLN
INTRAMUSCULAR | Status: AC
  Filled 2013-03-02: qty 1000

## 2013-03-02 MED ORDER — MIDAZOLAM HCL 2 MG/2ML IJ SOLN
INTRAMUSCULAR | Status: AC
  Filled 2013-03-02: qty 2

## 2013-03-02 MED ORDER — FENTANYL CITRATE 0.05 MG/ML IJ SOLN
INTRAMUSCULAR | Status: AC
  Filled 2013-03-02: qty 2

## 2013-03-02 NOTE — H&P (View-Only) (Signed)
CARDIOLOGY CONSULT NOTE  Patient ID: [name] MRN: [id number] DOB/AGE: 58/03/1956 56 y.o.  Admit date: 02/28/2013 Referring Physician: PTH Primary PhysicianHALL,ZACK, MD Primary Cardiologist: (Formerly [place]) Kaydin Karbowski Reason for Consultation:Chest Pain Principal Problem:   Chest pain, unspecified Active Problems:   HIGH BLOOD PRESSURE   Diabetes   Morbid obesity   Other and unspecified hyperlipidemia  HPI: [name] is an obese 56-year-old female patient who was admitted for chest pain  The patient described as sharp-heaviness in the center of her chest, nonradiating without associated shortness of breath, dizziness, diaphoresis, or nausea. which awakened her around 3 AM the morning of admission, constant  until arrival to ER 3 hours later. She waited until her husband awoke at 5 AM to report this. She did not take any medication for relief, but stayed in the bed waiting for husband to wake up.  As the patient tried to get up at the pain worsened and she fell to her knees do to the severity. On arrival to ER the patient was provided with sublingual nitroglycerin, x 2 with relief of pain. EKG on arrival demonstrated normal sinus rhythm without evidence of ACS. However, EKG this am demonstrates T-wave abnormalities noted inferiorly leads III and aVF with T-wave inversion and concavity noted  Cardiac enzymes have been cycled and found to be negative x4. D-dimer mildly elevated at less than 0.27. Patient has multiple cardiovascular risk factors and we are asked for cardiac recommendations.       Patient's prior history includes diabetes, asthma, hypercholesterolemia, obstructive sleep apnea (noncompliance with CPAP), and morbid obesity. He has been seen by cardiology in the past in 2006 with a stress Myoview which was found to be abnormal concerning breast attenuation and body habitus. "However a significant left anterior descending coronary artery lesion could not be excluded and clinical  correlation was therefore advised."  Patient did not followup with cardiology after stress test. She states she had no recurrence of chest pain since that time.  Review of systems complete and found to be negative unless listed above   Past Medical History  Diagnosis Date  . Diabetes mellitus   . Hypertension   . Asthma   . Overactive bladder   . High cholesterol   . Ankle fracture     required surgery   . Fibromyalgia   . Panic attacks   . Obstructive sleep apnea     Noncompliant with CPAP    Family History  Problem Relation Age of Onset  . Heart attack Mother   . Sick sinus syndrome Brother     Pacemaker  . Congenital heart disease Brother     CABG    History   Social History  . Marital Status: Married    Spouse Name: N/A    Number of Children: N/A  . Years of Education: N/A   Occupational History  . Not on file.   Social History Main Topics  . Smoking status: Former Smoker  . Smokeless tobacco: Not on file  . Alcohol Use: No  . Drug Use: No  . Sexually Active: Yes    Birth Control/ Protection: Surgical   Other Topics Concern  . Not on file   Social History Narrative  . No narrative on file    Past Surgical History  Procedure Laterality Date  . Abdominal hysterectomy    . Knee replacements      bilateral  . Back surgery    . Tonsillectomy    . Carpal tunnel   release       Prescriptions prior to admission  Medication Sig Dispense Refill  . albuterol (PROVENTIL HFA;VENTOLIN HFA) 108 (90 BASE) MCG/ACT inhaler Inhale 2 puffs into the lungs every 4 (four) hours as needed. Shortness of Breath       . atorvastatin (LIPITOR) 10 MG tablet Take 10 mg by mouth at bedtime.      . Fesoterodine Fumarate (TOVIAZ) 8 MG TB24 Take 8 mg by mouth every evening.       . glimepiride (AMARYL) 2 MG tablet Take 1 mg by mouth daily before lunch.      . imipramine (TOFRANIL) 50 MG tablet Take 50 mg by mouth at bedtime.        . metoprolol (LOPRESSOR) 50 MG tablet Take 50 mg  by mouth 2 (two) times daily.          Physical Exam: Blood pressure 136/79, pulse 56, temperature 97.2 F (36.2 C), temperature source Oral, resp. rate 20, height 5' 7" (1.702 m), weight 285 lb (129.275 kg), SpO2 98.00%.   General: Well developed, well nourished, in no acute distress, morbidly obese. Head: Eyes PERRLA, No xanthomas.   Normal cephalic and atramatic  Lungs: Clear bilaterally to auscultation , with scan inspiratory wheezes, cleared with cough. Heart: HRRR S1 S2, without MRG.  Pulses are 2+ & equal.            No carotid bruit. No JVD.  No abdominal bruits. No femoral bruits. Abdomen: Bowel sounds are positive, abdomen soft and non-tender without masses or                  Hernia's noted. Msk:  Back normal, normal gait. Normal strength and tone for age. Extremities: No clubbing, cyanosis or edema. Significant varicosities in the calves and ankles  DP +1 Neuro: Alert and oriented X 3. Psych:  Good affect, responds appropriately  Labs:   Lab Results  Component Value Date   WBC 5.8 03/01/2013   HGB 14.7 03/01/2013   HCT 43.6 03/01/2013   MCV 89.3 03/01/2013   PLT 200 03/01/2013     Recent Labs Lab 02/28/13 0630 03/01/13 0327  NA 139 139  K 4.4 4.0  CL 104 102  CO2 26 29  BUN 16 14  CREATININE 0.77 0.91  CALCIUM 9.4 9.3  PROT 6.7  --   BILITOT 0.5  --   ALKPHOS 95  --   ALT 30  --   AST 33  --   GLUCOSE 108* 110*   Lab Results  Component Value Date   CKTOTAL 125 07/05/2009   CKMB 2.2 07/05/2009   TROPONINI <0.30 03/01/2013    Lab Results  Component Value Date   CHOL 125 02/28/2013   CHOL  Value: 143        ATP III CLASSIFICATION:  <200     mg/dL   Desirable  200-239  mg/dL   Borderline High  >=240    mg/dL   High        07/06/2009   CHOL  Value: 113        ATP III CLASSIFICATION:  <200     mg/dL   Desirable  200-239  mg/dL   Borderline High  >=240    mg/dL   High 08/16/2007   Lab Results  Component Value Date   HDL 41 02/28/2013   HDL 38* 07/06/2009   HDL  36* 08/16/2007   Lab Results  Component Value Date   LDLCALC 57   02/28/2013   LDLCALC  Value: 76        Total Cholesterol/HDL:CHD Risk Coronary Heart Disease Risk Table                     Men   Women  1/2 Average Risk   3.4   3.3  Average Risk       5.0   4.4  2 X Average Risk   9.6   7.1  3 X Average Risk  23.4   11.0        Use the calculated Patient Ratio above and the CHD Risk Table to determine the patient's CHD Risk.        ATP III CLASSIFICATION (LDL):  <100     mg/dL   Optimal  100-129  mg/dL   Near or Above                    Optimal  130-159  mg/dL   Borderline  160-189  mg/dL   High  >190     mg/dL   Very High 07/06/2009   LDLCALC  Value: 51        Total Cholesterol/HDL:CHD Risk Coronary Heart Disease Risk Table                     Men   Women  1/2 Average Risk   3.4   3.3 08/16/2007   Lab Results  Component Value Date   TRIG 134 02/28/2013   TRIG 145 07/06/2009   TRIG 132 08/16/2007   Lab Results  Component Value Date   CHOLHDL 3.0 02/28/2013   CHOLHDL 3.8 07/06/2009   CHOLHDL 3.1 08/16/2007   No results found for this basename: LDLDIRECT    Nuclear Stress Myoview 09/08/2005 Impression  This is an abnormal perfusion scan with significant breast attenuation  likely due to the patient's body habitus. The wall motion of the anterior  wall appears to be normal on the post stress images and the ejection  fraction is normal as well. However, a significant left anterior  descending coronary artery lesion cannot be excluded and clinical  correlation is therefore advised.   Radiology: Dg Chest Portable 1 View  02/28/2013  *RADIOLOGY REPORT*  Clinical Data: Chest pain and hypertension.  PORTABLE CHEST - 1 VIEW  Comparison: 01/06/2013  Findings: Slightly shallow inspiration.  Normal heart size and pulmonary vascularity.  No focal consolidation or airspace disease in the lungs.  No blunting of costophrenic angles.  No pneumothorax.  Mediastinal contours appear intact.  No significant change since  previous study.  IMPRESSION: No evidence of active pulmonary disease.   Original Report Authenticated By: William Stevens, M.D.    EKG:NSR rate of 54 with inferior T-wave abnormalities, Inversion in III and nonspecific changes in aVF, and V3.  ASSESSMENT AND PLAN:  1. Chest pain: Worrisome for unstable angina in a patient with multiple cardiovascular risk factors to include hypertension, hypercholesterolemia,diabetes, family history, obesity. Cardiac enzymes are negative x4 but there are some T-wave abnormalities noted in the inferior leads which are new compared to initial EKG on admission ER 24 hours earlier. He was awakened by this pain at 3 AM, lasting approximately 2 and half hours in duration. She has had one episode of recurrent discomfort in her chest relieved with morphine and nitroglycerin. This also occurred at rest. Recommend cardiac catheterization vs. repeat stress Myoview. I would error on the side of cardiac catheterization in the setting of her   symptoms although cardiac enzymes are negative. Will discuss further with Dr. Shailyn Weyandt. Patient's weight is 285 pounds, and can be accommodated in cath lab.  Cannot add aspirin, and she is allergic to this. Review of home meds has  her on metoprolol 50 mg twice a day which is continued on admission.  2. Hypertension: Pressure is well-controlled. She is not on ACE inhibitor at home, which is recommended for diabetic patients. Creatinine your 0.77. Lisinopril 2.5 mg daily is recommended.  3. Hypercholesterolemia: She remains on statin with the studies completed during this admission. LDL is 57 with an HDL of 41. Total cholesterol 125. Well-controlled currently.  4. Morbid obesity: Significant weight loss is recommended with reduced calorie intake and increased activity.  5. Diabetes: Hemoglobin A1c 5.9. Continue medical management per primary care.  6. Obstructive sleep apnea: Patient is noncompliant with CPAP due to claustrophobia. Alternate  measures should be addressed by primary care.  Signed: Kathryn M. Lawrence NP [facility] 03/01/2013, 9:47 AM  Addendum: Reviewed case with Dr. Jaree Dwight. He has looked at EKG's. Plan to transfer to [facility] for cardiac cath in am. I have discussed this with the patient including risks, benefits, and procedure details. She verbalizes understanding and is willing to be transferred for cardiac cath this afternoon, for cath in AM. Dr. Jordan will be preforming cardiac cath. I have placed NTG paste 1/2 inch to chest wall.  I have informed Dr.Gosrani, attending.  Cardiology Attending Patient discussed with Kathryn Lawrence, NP.  Above note annotated and modified based upon my findings.  Patient has multiple cardiovascular risk factors with recurrent chest discomfort including subsequent to admission and initial treatment. While negative cardiac markers, no acute EKG changes during chest discomfort and a benign examination are somewhat reassuring, the development of inferior T-wave inversion, albeit a nonspecific finding, the likelihood of a nondiagnostic noninvasive test result and the likelihood of continuing chest discomfort with subsequent hospital admissions have prompted me to recommend cardiac catheterization as the initial test, which the patient accepts. Transfer to [facility] has been arranged.   Able Malloy, MD 03/01/2013, 4:35 PM       

## 2013-03-02 NOTE — CV Procedure (Signed)
   Cardiac Catheterization Procedure Note  Name: [name] MRN: [id number] DOB: 12/05/1956  Procedure: Left Heart Cath, Selective Coronary Angiography, LV angiography  Indication: 57 yo WF with multiple cardiac risk factors presents with severe chest pain. Cardiac enzymes are negative and Ecg shows nonspecific changes.   Procedural details: The right groin was prepped, draped, and anesthetized with 1% lidocaine. Using modified Seldinger technique, a 5 French sheath was introduced into the right femoral artery. Standard Judkins catheters were used for coronary angiography and left ventriculography. Catheter exchanges were performed over a guidewire. There were no immediate procedural complications. The right femoral access was closed using a Mynx device with good hemostasis.The patient was transferred to the post catheterization recovery area for further monitoring.  Procedural Findings: Hemodynamics:  AO 150/87 mean 114 mmHg LV 150/13 mm Hg   Coronary angiography: Coronary dominance: codominant  Left mainstem: Normal  Left anterior descending (LAD): 20% ostial, otherwise normal.  Left circumflex (LCx): Normal  Right coronary artery (RCA): Normal.  Left ventriculography: Left ventricular systolic function is normal, LVEF is estimated at 55-65%, there is no significant mitral regurgitation   Final Conclusions:   1. No significant coronary artery disease 2. Normal LV function.  Recommendations: medical Rx for noncardiac chest pain with PPI.  Theron Arista [facility] 03/02/2013, 9:25 AM

## 2013-03-02 NOTE — Interval H&P Note (Signed)
History and Physical Interval Note:  03/02/2013 8:55 AM  [name]  has presented today for surgery, with the diagnosis of cad  The various methods of treatment have been discussed with the patient and family. After consideration of risks, benefits and other options for treatment, the patient has consented to  Procedure(s): LEFT HEART CATHETERIZATION WITH CORONARY ANGIOGRAM (N/A) as a surgical intervention .  The patient's history has been reviewed, patient examined, no change in status, stable for surgery.  I have reviewed the patient's chart and labs.  Questions were answered to the patient's satisfaction.     Theron Arista [facility] 03/02/2013 8:56 AM

## 2013-03-02 NOTE — Progress Notes (Signed)
Reviewed discharge instructions with patient and husband, they stated their understanding.  Patient ambulated in hallway after bedrest complete.  Right groin level 0 with no bleeding or hematoma.  Colman Cater

## 2013-03-02 NOTE — Discharge Summary (Signed)
Discharge Summary   Patient ID: [name] MRN: [id number], DOB/AGE: [birth year] 58 y.o. Admit date: 02/28/2013 D/C date:     03/02/2013  Primary Cardiologist: Rothbart  Primary Discharge Diagnoses:  1. Noncardiac chest pain, ?GI - cath 03/02/13: no significant CAD (only 20% LAD), normal LV function -placed on ppi 2. Diabetes mellitus 3. HTN 4. Hyperlipidemia 5. Morbid obesity BMI 44.7 6. OSA, noncompliant with CPAP due to claustrophobia  Secondary Discharge Diagnoses:  1. Asthma 2. Overactive bladder 3. Fibromyalgia 4. Panic attacks   Hospital Course: Ms. [name] is a 57 y/o F with history of DM, HTN, HL, obesity who presented initially to [facility] for chest pain. The patient described this as sharp-heaviness in the center of her chest, nonradiating without associated shortness of breath, dizziness, diaphoresis, or nausea. which awakened her around 3 AM the morning of admission, constant until arrival to ER 3 hours later. On arrival to ER the patient was provided with sublingual nitroglycerin, x 2 with relief of pain. EKG on arrival demonstrated normal sinus rhythm without evidence of ACS but follow-up EKG the next morning demonstrated T-wave abnormalities noted inferiorly leads III and aVF with T-wave inversion and concavity noted. Troponins remained negative and d-dimer was normal. She was not tachycardic, tachypnic or hypoxic. CXR was nonacute. LFTs were normal. Given the severity of her symptoms and EKG changes along with multiple CRF's, she was transferred to [facility] for planned cardiac cath. This was performed today showing only 20% LAD, no significant coronary artery disease, normal LV function. She h/o aspirin allergy/intolerance thus this was not started this admission. Dr. [place] has seen and examined her today and feels she is stable for discharge on a PPI for noncardiac CP. She was instructed to f/u PCP.  Discharge Vitals: Blood pressure 119/84, pulse 70, temperature  97.3 F (36.3 C), temperature source Oral, resp. rate 16, height 5\' 7"  (1.702 m), weight 284 lb 8 oz (129.048 kg), SpO2 98.00%.  Labs: Lab Results  Component Value Date   WBC 5.8 03/01/2013   HGB 14.7 03/01/2013   HCT 43.6 03/01/2013   MCV 89.3 03/01/2013   PLT 200 03/01/2013     Recent Labs Lab 02/28/13 0630 03/01/13 0327  NA 139 139  K 4.4 4.0  CL 104 102  CO2 26 29  BUN 16 14  CREATININE 0.77 0.91  CALCIUM 9.4 9.3  PROT 6.7  --   BILITOT 0.5  --   ALKPHOS 95  --   ALT 30  --   AST 33  --   GLUCOSE 108* 110*    Recent Labs  02/28/13 0630 02/28/13 1515 02/28/13 2120 03/01/13 0326  TROPONINI <0.30 <0.30 <0.30 <0.30   Lab Results  Component Value Date   CHOL 125 02/28/2013   HDL 41 02/28/2013   LDLCALC 57 02/28/2013   TRIG 134 02/28/2013   Lab Results  Component Value Date   DDIMER <0.27 02/28/2013    Diagnostic Studies/Procedures   Ct Head Wo Contrast  02/02/2013  *RADIOLOGY REPORT*  Clinical Data: Headache.  CT HEAD WITHOUT CONTRAST  Technique:  Contiguous axial images were obtained from the base of the skull through the vertex without contrast.  Comparison: 07/30/2012.  Findings: The ventricles are normal.  No extra-axial fluid collections are seen.  The brainstem and cerebellum are unremarkable.  No acute intracranial findings such as infarction or hemorrhage.  No mass lesions.  The bony calvarium is intact. The visualized paranasal sinuses and mastoid air cells are  clear except for left maxillary sinus disease.  IMPRESSION: No acute intracranial findings or mass lesions. Left maxillary sinus disease.   Original Report Authenticated By: Rudie Meyer, M.D.    Dg Chest Portable 1 View  02/28/2013  *RADIOLOGY REPORT*  Clinical Data: Chest pain and hypertension.  PORTABLE CHEST - 1 VIEW  Comparison: 01/06/2013  Findings: Slightly shallow inspiration.  Normal heart size and pulmonary vascularity.  No focal consolidation or airspace disease in the lungs.  No blunting of  costophrenic angles.  No pneumothorax.  Mediastinal contours appear intact.  No significant change since previous study.  IMPRESSION: No evidence of active pulmonary disease.   Original Report Authenticated By: Burman Nieves, M.D.     Discharge Medications     Medication List    TAKE these medications       albuterol 108 (90 BASE) MCG/ACT inhaler  Commonly known as:  PROVENTIL HFA;VENTOLIN HFA  Inhale 2 puffs into the lungs every 4 (four) hours as needed. Shortness of Breath     atorvastatin 10 MG tablet  Commonly known as:  LIPITOR  Take 10 mg by mouth at bedtime.     glimepiride 2 MG tablet  Commonly known as:  AMARYL  Take 1 mg by mouth daily before lunch.     imipramine 50 MG tablet  Commonly known as:  TOFRANIL  Take 50 mg by mouth at bedtime.     metoprolol 50 MG tablet  Commonly known as:  LOPRESSOR  Take 50 mg by mouth 2 (two) times daily.     omeprazole 20 MG capsule  Commonly known as:  PRILOSEC  Take 1 capsule (20 mg total) by mouth daily.     TOVIAZ 8 MG Tb24  Generic drug:  fesoterodine  Take 8 mg by mouth every evening.        Disposition   The patient will be discharged in stable condition to home. Discharge Orders   Future Appointments Provider Department Dept Phone   03/16/2013 1:20 PM Jodelle Gross, NP [facility] [phone]   Future Orders Complete By Expires     Diet - low sodium heart healthy  As directed     Comments:      Diabetic Diet    Increase activity slowly  As directed     Comments:      No driving for 2 days. No lifting over 5 lbs for 1 week. No sexual activity for 1 week. You may return to work on Monday 03/05/13. Keep procedure site clean & dry. If you notice increased pain, swelling, bleeding or pus, call/return!  You may shower, but no soaking baths/hot tubs/pools for 1 week.      Follow-up Information   Follow up with Joni Reining, NP. (03/16/13 at 1:20pm)    Contact information:   [facility] [address] [place] [address] [phone]       Follow up with [facility], MD. (Please follow up with your primary care doctor for your diabetes, blood pressure, sleep apnea, and other general medical issues. )    Contact information:   [address] [place] [address] [phone]         Duration of Discharge Encounter: Greater than 30 minutes including physician and PA time.  Signed, Ronie Spies PA-C 03/02/2013, 10:08 AM  Patient seen and examined and history reviewed. Agree with above findings and plan. Patient with no significant CAD to explain chest pain. Suspect this is GI in origin.  Will treat with PPI and have her follow up with primary MD.  Thedora Hinders 03/02/2013 11:39 AM

## 2013-03-06 ENCOUNTER — Telehealth: Payer: Self-pay | Admitting: *Deleted

## 2013-03-06 NOTE — Telephone Encounter (Signed)
TCM patient. Called to check status. Has all medications and does not have any questions. Only complaint is some soreness at cath site but no bruising. She is aware of post hospital appointment with Bailey Mech PA.

## 2013-03-15 NOTE — Progress Notes (Signed)
HPI: [name] not is a 57 year old patient of Dr. Dietrich Pates we are seeing post cardiac catheterization which was completed 03/02/2013 after hospitalization for chest pain. She had abnormal EKGs revealing T-wave abnormalities inferiorly. Multiple cardiovascular risk factors include diabetes,  Hypercholesterolemia, hypertension and morbid obesity. Cardiac catheterization revealed normal coronary anatomy, with normal LV function and was diagnosed with noncardiac chest pain.   Continues to have some chest discomfort with emotional upset. No exertional chest pain. She is not taking lisinopril as directed.   Allergies  Allergen Reactions  . Aspirin Nausea And Vomiting  . Codeine Rash    Current Outpatient Prescriptions  Medication Sig Dispense Refill  . albuterol (PROVENTIL HFA;VENTOLIN HFA) 108 (90 BASE) MCG/ACT inhaler Inhale 2 puffs into the lungs every 4 (four) hours as needed. Shortness of Breath       . ALPRAZolam (XANAX) 0.5 MG tablet Take 0.5 mg by mouth at bedtime as needed for sleep.      Marland Kitchen atorvastatin (LIPITOR) 10 MG tablet Take 10 mg by mouth at bedtime.      Marland Kitchen Fesoterodine Fumarate (TOVIAZ) 8 MG TB24 Take 8 mg by mouth every evening.       Marland Kitchen glimepiride (AMARYL) 2 MG tablet Take 1 mg by mouth daily before lunch.      . imipramine (TOFRANIL) 50 MG tablet Take 50 mg by mouth at bedtime.        . metoprolol (LOPRESSOR) 50 MG tablet Take 50 mg by mouth 2 (two) times daily.        Marland Kitchen omeprazole (PRILOSEC) 20 MG capsule Take 1 capsule (20 mg total) by mouth daily.  30 capsule  0  . lisinopril (PRINIVIL,ZESTRIL) 10 MG tablet        No current facility-administered medications for this visit.    Past Medical History  Diagnosis Date  . Diabetes mellitus   . Hypertension   . Asthma   . Overactive bladder   . High cholesterol   . Fibromyalgia   . Panic attacks   . Obstructive sleep apnea     Noncompliant with CPAP due to claustophobia.  . H/O cardiac catheterization     No  significant CAD (only 20% LAD) 03/02/13 with normal LV function.    Past Surgical History  Procedure Laterality Date  . Abdominal hysterectomy    . Knee replacements      bilateral  . Back surgery    . Tonsillectomy    . Carpal tunnel release    . Ankle surgery      ROS: Review of systems complete and found to be negative unless listed above  PHYSICAL EXAM BP 118/79  Pulse 80  Ht 5\' 7"  (1.702 m)  Wt 285 lb 12 oz (129.615 kg)  BMI 44.74 kg/m2  SpO2 97%  General: Well developed, well nourished, in no acute distress. Obese. Head: Eyes PERRLA, No xanthomas.   Normal cephalic and atramatic  Lungs: Clear bilaterally to auscultation and percussion. Heart: HRRR S1 S2, without MRG.  Pulses are 2+ & equal.            No carotid bruit. No JVD.  No abdominal bruits. No femoral bruits. Abdomen: Bowel sounds are positive, abdomen soft and non-tender without masses or                  Hernia's noted. Msk:  Back normal, normal gait. Normal strength and tone for age. Extremities: No clubbing, cyanosis or edema.  DP +1 Neuro: Alert and oriented  X 3. Psych:  Good affect, responds appropriately :  ASSESSMENT AND PLAN

## 2013-03-16 ENCOUNTER — Encounter: Payer: Self-pay | Admitting: Adult Health

## 2013-03-16 ENCOUNTER — Ambulatory Visit (INDEPENDENT_AMBULATORY_CARE_PROVIDER_SITE_OTHER): Payer: Medicaid Other | Admitting: Adult Health

## 2013-03-16 VITALS — BP 118/79 | HR 80 | Ht 67.0 in | Wt 285.8 lb

## 2013-03-16 DIAGNOSIS — E119 Type 2 diabetes mellitus without complications: Secondary | ICD-10-CM

## 2013-03-16 DIAGNOSIS — Z8679 Personal history of other diseases of the circulatory system: Secondary | ICD-10-CM

## 2013-03-16 DIAGNOSIS — R079 Chest pain, unspecified: Secondary | ICD-10-CM

## 2013-03-16 NOTE — Assessment & Plan Note (Signed)
I have again discussed the results of the cardiac catheterization with the patient and given reassurance. Chest pain is related to emotional distress and worry. She is white increase her exercise and have stress reducing activities. She has several social issues that are confusing. We will see the patient again on a when necessary basis only at the discretion of her primary care physician. She does have risk factors for cardiovascular disease progression should she not change lifestyle, lose weight. Risk modification to be priority.

## 2013-03-16 NOTE — Patient Instructions (Addendum)
Your physician recommends that you schedule a follow-up appointment in: As needed  

## 2013-03-16 NOTE — Assessment & Plan Note (Signed)
Ongoing treatment per primary care. Her device her on a low-carb diet and increased exercise to help with weight loss and management of diabetes.

## 2013-03-16 NOTE — Progress Notes (Deleted)
Name: [name]    DOB: [birth year]  Age: 57 y.o.  MR#: [id number]       PCP:  Dwana Melena, MD      Insurance: Payor: [employer] MEDICARE  Plan: AARP MEDICARE COMPLETE  Product Type: *No Product type*    CC:    Chief Complaint  Patient presents with  . Chest Pain    Non-cardiac    VS Filed Vitals:   03/16/13 1315  BP: 118/79  Pulse: 80  Height: 5\' 7"  (1.702 m)  Weight: 285 lb 12 oz (129.615 kg)  SpO2: 97%    Weights Current Weight  03/16/13 285 lb 12 oz (129.615 kg)  03/02/13 284 lb 8 oz (129.048 kg)  03/02/13 284 lb 8 oz (129.048 kg)    Blood Pressure  BP Readings from Last 3 Encounters:  03/16/13 118/79  03/02/13 137/85  03/02/13 137/85     Admit date:  (Not on file) Last encounter with RMR:  Visit date not found   Allergy Aspirin and Codeine  Current Outpatient Prescriptions  Medication Sig Dispense Refill  . albuterol (PROVENTIL HFA;VENTOLIN HFA) 108 (90 BASE) MCG/ACT inhaler Inhale 2 puffs into the lungs every 4 (four) hours as needed. Shortness of Breath       . ALPRAZolam (XANAX) 0.5 MG tablet Take 0.5 mg by mouth at bedtime as needed for sleep.      Marland Kitchen atorvastatin (LIPITOR) 10 MG tablet Take 10 mg by mouth at bedtime.      Marland Kitchen Fesoterodine Fumarate (TOVIAZ) 8 MG TB24 Take 8 mg by mouth every evening.       Marland Kitchen glimepiride (AMARYL) 2 MG tablet Take 1 mg by mouth daily before lunch.      . imipramine (TOFRANIL) 50 MG tablet Take 50 mg by mouth at bedtime.        . metoprolol (LOPRESSOR) 50 MG tablet Take 50 mg by mouth 2 (two) times daily.        Marland Kitchen omeprazole (PRILOSEC) 20 MG capsule Take 1 capsule (20 mg total) by mouth daily.  30 capsule  0  . lisinopril (PRINIVIL,ZESTRIL) 10 MG tablet        No current facility-administered medications for this visit.    Discontinued Meds:   There are no discontinued medications.  Patient Active Problem List  Diagnosis  . CHRONIC OSTEOMYELITIS ANKLE AND FOOT  . HIGH BLOOD PRESSURE  . Chest pain, unspecified   . Diabetes  . Morbid obesity  . Other and unspecified hyperlipidemia    LABS    Component Value Date/Time   NA 139 03/01/2013 0327   NA 139 02/28/2013 0630   NA 139 02/02/2013 1711   K 4.0 03/01/2013 0327   K 4.4 02/28/2013 0630   K 4.2 02/02/2013 1711   CL 102 03/01/2013 0327   CL 104 02/28/2013 0630   CL 103 02/02/2013 1711   CO2 29 03/01/2013 0327   CO2 26 02/28/2013 0630   CO2 26 02/02/2013 1711   GLUCOSE 110* 03/01/2013 0327   GLUCOSE 108* 02/28/2013 0630   GLUCOSE 106* 02/02/2013 1711   BUN 14 03/01/2013 0327   BUN 16 02/28/2013 0630   BUN 13 02/02/2013 1711   CREATININE 0.91 03/01/2013 0327   CREATININE 0.77 02/28/2013 0630   CREATININE 0.81 02/02/2013 1711   CALCIUM 9.3 03/01/2013 0327   CALCIUM 9.4 02/28/2013 0630   CALCIUM 9.4 02/02/2013 1711   GFRNONAA 69* 03/01/2013 0327   GFRNONAA >90 02/28/2013 0630   GFRNONAA 79*  02/02/2013 1711   GFRAA 80* 03/01/2013 0327   GFRAA >90 02/28/2013 0630   GFRAA >90 02/02/2013 1711   CMP     Component Value Date/Time   NA 139 03/01/2013 0327   K 4.0 03/01/2013 0327   CL 102 03/01/2013 0327   CO2 29 03/01/2013 0327   GLUCOSE 110* 03/01/2013 0327   BUN 14 03/01/2013 0327   CREATININE 0.91 03/01/2013 0327   CALCIUM 9.3 03/01/2013 0327   PROT 6.7 02/28/2013 0630   ALBUMIN 3.7 02/28/2013 0630   AST 33 02/28/2013 0630   ALT 30 02/28/2013 0630   ALKPHOS 95 02/28/2013 0630   BILITOT 0.5 02/28/2013 0630   GFRNONAA 69* 03/01/2013 0327   GFRAA 80* 03/01/2013 0327       Component Value Date/Time   WBC 5.8 03/01/2013 0327   WBC 4.2 02/28/2013 0630   WBC 4.1 02/02/2013 1711   HGB 14.7 03/01/2013 0327   HGB 13.6 02/28/2013 0630   HGB 14.5 02/02/2013 1711   HCT 43.6 03/01/2013 0327   HCT 39.8 02/28/2013 0630   HCT 43.8 02/02/2013 1711   MCV 89.3 03/01/2013 0327   MCV 89.0 02/28/2013 0630   MCV 88.8 02/02/2013 1711    Lipid Panel     Component Value Date/Time   CHOL 125 02/28/2013 0630   TRIG 134 02/28/2013 0630   HDL 41 02/28/2013 0630   CHOLHDL 3.0 02/28/2013 0630    VLDL 27 02/28/2013 0630   LDLCALC 57 02/28/2013 0630    ABG No results found for this basename: phart, pco2, pco2art, po2, po2art, hco3, tco2, acidbasedef, o2sat     Lab Results  Component Value Date   TSH 2.724 ***Test methodology is 3rd generation TSH**** 07/06/2009   BNP (last 3 results) No results found for this basename: PROBNP,  in the last 8760 hours Cardiac Panel (last 3 results) No results found for this basename: CKTOTAL, CKMB, TROPONINI, RELINDX,  in the last 72 hours  Iron/TIBC/Ferritin No results found for this basename: iron, tibc, ferritin     EKG Orders placed in visit on 03/16/13  . EKG 12-LEAD     Prior Assessment and Plan Problem List as of 03/16/2013     ICD-9-CM   CHRONIC OSTEOMYELITIS ANKLE AND FOOT   HIGH BLOOD PRESSURE   Chest pain, unspecified   Diabetes   Morbid obesity   Other and unspecified hyperlipidemia       Imaging: Dg Chest Portable 1 View  02/28/2013  *RADIOLOGY REPORT*  Clinical Data: Chest pain and hypertension.  PORTABLE CHEST - 1 VIEW  Comparison: 01/06/2013  Findings: Slightly shallow inspiration.  Normal heart size and pulmonary vascularity.  No focal consolidation or airspace disease in the lungs.  No blunting of costophrenic angles.  No pneumothorax.  Mediastinal contours appear intact.  No significant change since previous study.  IMPRESSION: No evidence of active pulmonary disease.   Original Report Authenticated By: Burman Nieves, M.D.

## 2013-03-16 NOTE — Assessment & Plan Note (Signed)
Excellent control of blood pressure. Would recommend low-dose lisinopril but she does now wish to take it at this time. Blood pressure is low normal at 118/68. She will continue metoprolol 50 mg twice a day, as this is helping with "angina" and heart racing with emotional stressors

## 2013-04-07 ENCOUNTER — Other Ambulatory Visit: Payer: Self-pay | Admitting: Physician Assistant

## 2013-04-07 ENCOUNTER — Other Ambulatory Visit: Payer: Self-pay | Admitting: Obstetrics and Gynecology

## 2013-04-11 ENCOUNTER — Ambulatory Visit (HOSPITAL_COMMUNITY)
Admission: RE | Admit: 2013-04-11 | Discharge: 2013-04-11 | Disposition: A | Discharge status: Home / Self Care | Payer: Medicare Other | Source: Ambulatory Visit | Attending: Orthopaedic Surgery | Admitting: Orthopaedic Surgery

## 2013-04-11 DIAGNOSIS — M6281 Muscle weakness (generalized): Secondary | ICD-10-CM | POA: Insufficient documentation

## 2013-04-11 DIAGNOSIS — M25569 Pain in unspecified knee: Secondary | ICD-10-CM | POA: Insufficient documentation

## 2013-04-11 DIAGNOSIS — R29898 Other symptoms and signs involving the musculoskeletal system: Secondary | ICD-10-CM | POA: Insufficient documentation

## 2013-04-11 DIAGNOSIS — E119 Type 2 diabetes mellitus without complications: Secondary | ICD-10-CM | POA: Insufficient documentation

## 2013-04-11 DIAGNOSIS — R262 Difficulty in walking, not elsewhere classified: Secondary | ICD-10-CM | POA: Insufficient documentation

## 2013-04-11 DIAGNOSIS — IMO0001 Reserved for inherently not codable concepts without codable children: Secondary | ICD-10-CM | POA: Insufficient documentation

## 2013-04-11 NOTE — Evaluation (Signed)
Physical Therapy Evaluation  Patient Details  Name: [name] MRN: [id number] Date of Birth: [birth year]  Today's Date: 04/11/2013 Time: 1300-1340 PT Time Calculation (min): 40 min              Visit#: 1 of 12  Re-eval: 05/11/13 Assessment Diagnosis: B knee pain. Surgical Date:  (2006, 2008 TKR) Prior Therapy: after TKR Charge:  eval Authorization: A[employer] medicare    Authorization Time Period:    Authorization Visit#: 1 of 10   Past Medical History:  Past Medical History  Diagnosis Date  . Diabetes mellitus   . Hypertension   . Asthma   . Overactive bladder   . High cholesterol   . Fibromyalgia   . Panic attacks   . Obstructive sleep apnea     Noncompliant with CPAP due to claustophobia.  . H/O cardiac catheterization     No significant CAD (only 20% LAD) 03/02/13 with normal LV function.   Past Surgical History:  Past Surgical History  Procedure Laterality Date  . Abdominal hysterectomy    . Knee replacements      bilateral  . Back surgery    . Tonsillectomy    . Carpal tunnel release    . Ankle surgery      Subjective Symptoms/Limitations Symptoms: [name] is a 57 yo female referred to physical therapy for B knee pain.  She states that there was no injury or trauma.  The patient had her R TKR on 2008; L in 2006.  She states that she has been doing well with her knees until the past two months.  She states that she has started to have popping, difficulty going from sit to stand and her knees are feeling like they are going to give out on her. She states this is occuring  equally on both the left and the right.  How long can you sit comfortably?: sitting is fine it is when she goes to stand up that she has pain. How long can you stand comfortably?: Less than five minutes How long can you walk comfortably?: she can walk five to ten minutes. Pain Assessment Currently in Pain?: No/denies Pain Score:   8 (when she goes to move her knees ) Pain Location:  Knee Pain Orientation: Right;Left Pain Relieving Factors: ice Effect of Pain on Daily Activities: increases pain    FAllen: NO  Sensation/Coordination/Flexibility/Functional Tests Functional Tests Functional Tests: LEFS 24/80  Assessment RLE AROM (degrees) Right Knee Extension: 0 Right Knee Flexion: 125 RLE Strength Right Hip Flexion: 3/5 Right Hip Extension: 3/5 Right Hip ABduction: 3/5 Right Hip ADduction: 3/5 Right Knee Flexion: 3+/5 Right Knee Extension: 3+/5 Right Ankle Dorsiflexion: 3/5 LLE AROM (degrees) Left Knee Extension: 0 Left Knee Flexion: 120 LLE Strength Left Hip Flexion: 3-/5 Left Hip Extension: 3/5 Left Hip ABduction: 3-/5 Left Hip ADduction: 3/5 Left Knee Flexion: 3+/5 Left Knee Extension: 3/5 Left Ankle Dorsiflexion: 3/5  Exercise/Treatments Seated Long Arc Quad: Both;5 reps Other Seated Knee Exercises: Ankle dorsi/plantarflexion x 10 Supine Straight Leg Raises: Both;5 reps Sidelying Hip ABduction: Both;5 reps Hip ADduction: Both;5 reps Prone  Hamstring Curl: 5 reps Hip Extension: 5 reps    Physical Therapy Assessment and Plan PT Assessment and Plan Clinical Impression Statement: Pt s/p B TKR who has managed well until the past two months.  Her legs are now wantng to give way on her.  Exam shows normal ROM but significant decrease strength and decrease balance.  [name] will benefit from skilled  PT to improve her strength and balance thus reducing her risk of falling and improving her functional mobility, Pt will benefit from skilled therapeutic intervention in order to improve on the following deficits: Decreased balance;Pain;Obesity;Decreased strength;Difficulty walking Rehab Potential: Good    Goals Home Exercise Program Pt will Perform Home Exercise Program: Independently PT Goal: Perform Home Exercise Program - Progress: Goal set today PT Short Term Goals Time to Complete Short Term Goals: 2 weeks PT Short Term Goal 1: strength  to be increased by 1/2 grade to allow pt to be able to stand for 10-15 minutes PT Short Term Goal 1 - Progress: Progressing toward goal PT Long Term Goals Time to Complete Long Term Goals: 4 weeks PT Long Term Goal 1: strength to be increased one grade to allow pt to be able to come sit to stand without difficulty. PT Long Term Goal 2: Pt to be able to walk for 30 mintues without knees giving way Long Term Goal 3: Pt to be able to go up and down steps reciprocally, Long Term Goal 4: Pt to be able to stand for 20 mintues   Problem List Patient Active Problem List   Diagnosis Date Noted  . Difficulty in walking 04/11/2013  . Leg weakness, bilateral 04/11/2013  . Chest pain, unspecified 02/28/2013  . Diabetes 02/28/2013  . Morbid obesity 02/28/2013  . Other and unspecified hyperlipidemia 02/28/2013  . CHRONIC OSTEOMYELITIS ANKLE AND FOOT 07/22/2009  . HIGH BLOOD PRESSURE 07/22/2009       GP Functional Assessment Tool Used: LEFS Functional Limitation: Mobility: Walking and moving around Mobility: Walking and Moving Around Current Status [phone]): At least 60 percent but less than 80 percent impaired, limited or restricted Mobility: Walking and Moving Around Goal Status [phone]): At least 20 percent but less than 40 percent impaired, limited or restricted  Hrithik Boschee,CINDY 04/11/2013, 2:30 PM  Physician Documentation Your signature is required to indicate approval of the treatment plan as stated above.  Please sign and either send electronically or make a copy of this report for your files and return this physician signed original.   Please mark one 1.__approve of plan  2. ___approve of plan with the following conditions.   ______________________________                                                          _____________________ Physician Signature                                                                                                             Date

## 2013-04-13 ENCOUNTER — Inpatient Hospital Stay (HOSPITAL_COMMUNITY): Admission: RE | Admit: 2013-04-13 | Payer: Medicare Other | Source: Ambulatory Visit | Admitting: *Deleted

## 2013-04-16 ENCOUNTER — Ambulatory Visit (HOSPITAL_COMMUNITY)
Admission: RE | Admit: 2013-04-16 | Discharge: 2013-04-16 | Disposition: A | Discharge status: Home / Self Care | Payer: Medicare Other | Source: Ambulatory Visit | Attending: Orthopaedic Surgery | Admitting: Orthopaedic Surgery

## 2013-04-16 DIAGNOSIS — M6281 Muscle weakness (generalized): Secondary | ICD-10-CM | POA: Insufficient documentation

## 2013-04-16 DIAGNOSIS — IMO0001 Reserved for inherently not codable concepts without codable children: Secondary | ICD-10-CM | POA: Insufficient documentation

## 2013-04-16 DIAGNOSIS — E119 Type 2 diabetes mellitus without complications: Secondary | ICD-10-CM | POA: Insufficient documentation

## 2013-04-16 DIAGNOSIS — R262 Difficulty in walking, not elsewhere classified: Secondary | ICD-10-CM | POA: Insufficient documentation

## 2013-04-16 DIAGNOSIS — M25569 Pain in unspecified knee: Secondary | ICD-10-CM | POA: Insufficient documentation

## 2013-04-16 NOTE — Progress Notes (Signed)
Physical Therapy Treatment Patient Details  Name: [name] MRN: [id number] Date of Birth: 1956/03/02  Today's Date: 04/16/2013 Time: 4098-1191 PT Time Calculation (min): 42 min Visit#: 2 of 12  Re-eval: 05/11/13 Authorization: [employer]monds medicare  Authorization Visit#: 2 of 10  Charges:  therex 40'  Subjective: Symptoms/Limitations Symptoms: Pt. reports compliance with HEP.  States pain is 7/10 and equal in BLE's. Pain Assessment Currently in Pain?: Yes Pain Score:   7 Pain Location: Knee Pain Orientation: Right;Left   Exercise/Treatments Aerobic Stationary Bike: NuStep Level 2 8' Seated Long Arc Quad: 10 reps;Both Other Seated Knee Exercises: Ankle dorsi/plantarflexion x 10 Supine Straight Leg Raises: 10 reps;Both Sidelying Hip ABduction: 10 reps;Both Hip ADduction: 10 reps;Both Prone  Hamstring Curl: 10 reps Hip Extension: 10 reps;Both      Physical Therapy Assessment and Plan PT Assessment and Plan Clinical Impression Statement: Pt. able to perform exercises correctly with guidance; unable to recall independently.  No complaints with any activities today.  Added the nustep machine prior to exercises for activity tolerance/warming of muscles.  Audible grinding in L knee with certain positions.   Bridges added to POC.l PT Plan: Add rockerboard, heel/toe raises, minisquats, steps and TKE.  Progress to SLS, step ups, lunges and stairclimbing per PT POC.       Lurena Nida, PTA/CLT 04/16/2013, 2:32 PM

## 2013-04-18 ENCOUNTER — Ambulatory Visit (HOSPITAL_COMMUNITY)
Admission: RE | Admit: 2013-04-18 | Discharge: 2013-04-18 | Disposition: A | Discharge status: Home / Self Care | Payer: Medicare Other | Source: Ambulatory Visit | Attending: Orthopaedic Surgery | Admitting: Orthopaedic Surgery

## 2013-04-18 DIAGNOSIS — R29898 Other symptoms and signs involving the musculoskeletal system: Secondary | ICD-10-CM

## 2013-04-18 DIAGNOSIS — R262 Difficulty in walking, not elsewhere classified: Secondary | ICD-10-CM

## 2013-04-18 NOTE — Progress Notes (Signed)
Physical Therapy Treatment Patient Details  Name: [name] MRN: [id number] Date of Birth: November 04, 1956 There ex 28' Today's Date: 04/18/2013 Time: 1322-1350 PT Time Calculation (min): 28 min  Visit#: 3 of 12  Re-eval: 05/11/13    Authorization: [employer] medicare  Authorization Time Period:    Authorization Visit#: 3 of 10   Subjective: Symptoms/Limitations Symptoms: Pt states pain is decreasing but continues to increase with activity Pain Assessment Pain Score:   5 Pain Location: Knee Pain Orientation: Right;Left   Exercise/Treatments Stationary Bike:  (Nustep L 2 x 10') Standing Heel Raises: 10 reps Knee Flexion: Both;10 reps;Limitations Knee Flexion Limitations: 4# wt Terminal Knee Extension: Strengthening;Both;10 reps Functional Squat: 10 reps Wall Squat: 10 reps    Physical Therapy Assessment and Plan PT Assessment and Plan Clinical Impression Statement: Pt 20 minutes late for appointment today.  Pt 20 minutes late for appointment today.   Rehab Potential: Good PT Frequency: Min 2X/week PT Plan: begin SLS, step up forward and lateral    Goals  progresssin  Problem List Patient Active Problem List   Diagnosis Date Noted  . Difficulty in walking 04/11/2013  . Leg weakness, bilateral 04/11/2013  . Chest pain, unspecified 02/28/2013  . Diabetes 02/28/2013  . Morbid obesity 02/28/2013  . Other and unspecified hyperlipidemia 02/28/2013  . CHRONIC OSTEOMYELITIS ANKLE AND FOOT 07/22/2009  . HIGH BLOOD PRESSURE 07/22/2009    PT - End of Session Activity Tolerance: Patient tolerated treatment well General Behavior During Therapy: [facility] for tasks assessed/performed Cognition: WFL for tasks performed  GP    [name],CINDY 04/18/2013, 1:59 PM

## 2013-04-20 ENCOUNTER — Ambulatory Visit (HOSPITAL_COMMUNITY)
Admission: RE | Admit: 2013-04-20 | Discharge: 2013-04-20 | Disposition: A | Discharge status: Home / Self Care | Payer: Medicare Other | Source: Ambulatory Visit | Attending: Orthopaedic Surgery | Admitting: Orthopaedic Surgery

## 2013-04-20 DIAGNOSIS — R262 Difficulty in walking, not elsewhere classified: Secondary | ICD-10-CM

## 2013-04-20 DIAGNOSIS — R29898 Other symptoms and signs involving the musculoskeletal system: Secondary | ICD-10-CM

## 2013-04-20 NOTE — Progress Notes (Signed)
Physical Therapy Treatment Patient Details  Name: SAMEEHA ROCKEFELLER MRN: [id number] Date of Birth: 24-Sep-1956  Today's Date: 04/20/2013 Time: 1191-4782 PT Time Calculation (min): 43 min Charge there ex x 42 Visit#: 4 of 12  Re-eval: 05/11/13    Authorization: [employer] medicare   Authorization Visit#: 4 of 10   Subjective: Symptoms/Limitations Symptoms: Pt states her knees are feeling better.  Having some difficulty getting down into her flower beds.   Exercise/Treatments   Aerobic Stationary Bike:  (Nustep L 2 x 10')   Standing Heel Raises: 10 reps Knee Flexion: Both;10 reps;Limitations Knee Flexion Limitations: 4# wt Forward Lunges: 10 reps Side Lunges: 10 reps Terminal Knee Extension: Strengthening;Both;10 reps Lateral Step Up: 10 reps Forward Step Up: 10 reps Functional Squat: 10 reps Wall Squat: 10 reps Rocker Board: 2 minutes SLS with Vectors: 3 x 5" each   All exercises done B Physical Therapy Assessment and Plan PT Assessment and Plan Clinical Impression Statement: Pt doing well with exercises states pain is decreasing.  Added vector stances as well as lunges with good form Pt will benefit from skilled therapeutic intervention in order to improve on the following deficits: Decreased balance;Pain;Obesity;Decreased strength;Difficulty walking Rehab Potential: Good PT Frequency: Min 2X/week PT Duration: 4 weeks PT Treatment/Interventions: Stair training;Therapeutic activities;Therapeutic exercise;Patient/family education;Balance training PT Plan: begin steps in department.    Goals Home Exercise Program PT Goal: Perform Home Exercise Program - Progress: Met PT Short Term Goals PT Short Term Goal 1: strength to be increased by 1/2 grade to allow pt to be able to stand for 10-15 minutes PT Short Term Goal 1 - Progress: Met PT Long Term Goals PT Long Term Goal 1: strength to be increased one grade to allow pt to be able to come sit to stand without difficulty. PT  Long Term Goal 1 - Progress: Met PT Long Term Goal 2: Pt to be able to walk for 30 mintues without knees giving way PT Long Term Goal 2 - Progress: Progressing toward goal Long Term Goal 3: Pt to be able to go up and down steps reciprocally, Long Term Goal 3 Progress: Progressing toward goal Long Term Goal 4: Pt to be able to stand for 20 mintues  Long Term Goal 4 Progress: Progressing toward goal  Problem List Patient Active Problem List   Diagnosis Date Noted  . Difficulty in walking 04/11/2013  . Leg weakness, bilateral 04/11/2013  . Chest pain, unspecified 02/28/2013  . Diabetes 02/28/2013  . Morbid obesity 02/28/2013  . Other and unspecified hyperlipidemia 02/28/2013  . CHRONIC OSTEOMYELITIS ANKLE AND FOOT 07/22/2009  . HIGH BLOOD PRESSURE 07/22/2009    PT - End of Session Activity Tolerance: Patient tolerated treatment well General Behavior During Therapy: WFL for tasks assessed/performed Cognition: WFL for tasks performed  GP Functional Assessment Tool Used: LEFS Functional Limitation: Mobility: Walking and moving around  RUSSELL,CINDY 04/20/2013, 3:16 PM

## 2013-04-23 ENCOUNTER — Ambulatory Visit (HOSPITAL_COMMUNITY): Payer: Medicare Other | Admitting: Physical Therapy

## 2013-04-23 ENCOUNTER — Telehealth (HOSPITAL_COMMUNITY): Payer: Self-pay

## 2013-04-25 ENCOUNTER — Emergency Department (HOSPITAL_COMMUNITY)
Admission: EM | Admit: 2013-04-25 | Discharge: 2013-04-25 | Disposition: A | Discharge status: Home / Self Care | Payer: Medicare Other | Attending: Emergency Medicine | Admitting: Emergency Medicine

## 2013-04-25 ENCOUNTER — Emergency Department (HOSPITAL_COMMUNITY): Payer: Medicare Other

## 2013-04-25 ENCOUNTER — Inpatient Hospital Stay (HOSPITAL_COMMUNITY): Admission: RE | Admit: 2013-04-25 | Payer: Medicare Other | Source: Ambulatory Visit | Admitting: *Deleted

## 2013-04-25 ENCOUNTER — Encounter (HOSPITAL_COMMUNITY): Payer: Self-pay | Admitting: Emergency Medicine

## 2013-04-25 DIAGNOSIS — Z87891 Personal history of nicotine dependence: Secondary | ICD-10-CM | POA: Insufficient documentation

## 2013-04-25 DIAGNOSIS — Z9861 Coronary angioplasty status: Secondary | ICD-10-CM | POA: Insufficient documentation

## 2013-04-25 DIAGNOSIS — E7800 Pure hypercholesterolemia, unspecified: Secondary | ICD-10-CM | POA: Insufficient documentation

## 2013-04-25 DIAGNOSIS — J45909 Unspecified asthma, uncomplicated: Secondary | ICD-10-CM | POA: Insufficient documentation

## 2013-04-25 DIAGNOSIS — E119 Type 2 diabetes mellitus without complications: Secondary | ICD-10-CM | POA: Insufficient documentation

## 2013-04-25 DIAGNOSIS — Z9889 Other specified postprocedural states: Secondary | ICD-10-CM | POA: Insufficient documentation

## 2013-04-25 DIAGNOSIS — Z8669 Personal history of other diseases of the nervous system and sense organs: Secondary | ICD-10-CM | POA: Insufficient documentation

## 2013-04-25 DIAGNOSIS — Z8659 Personal history of other mental and behavioral disorders: Secondary | ICD-10-CM | POA: Insufficient documentation

## 2013-04-25 DIAGNOSIS — Z8739 Personal history of other diseases of the musculoskeletal system and connective tissue: Secondary | ICD-10-CM | POA: Insufficient documentation

## 2013-04-25 DIAGNOSIS — I10 Essential (primary) hypertension: Secondary | ICD-10-CM | POA: Insufficient documentation

## 2013-04-25 DIAGNOSIS — Z87448 Personal history of other diseases of urinary system: Secondary | ICD-10-CM | POA: Insufficient documentation

## 2013-04-25 DIAGNOSIS — M65839 Other synovitis and tenosynovitis, unspecified forearm: Secondary | ICD-10-CM | POA: Insufficient documentation

## 2013-04-25 DIAGNOSIS — M778 Other enthesopathies, not elsewhere classified: Secondary | ICD-10-CM

## 2013-04-25 DIAGNOSIS — Z79899 Other long term (current) drug therapy: Secondary | ICD-10-CM | POA: Insufficient documentation

## 2013-04-25 MED ORDER — PREDNISONE 10 MG PO TABS: 20.0000 mg | ORAL_TABLET | Freq: Two times a day (BID) | ORAL | Status: DC

## 2013-04-25 NOTE — ED Notes (Signed)
Pt c/o rt hand pain since the weekend. Pt states the area is swollen and middle and ring fingers are tender to the touch. Pt denies any injury.

## 2013-04-25 NOTE — ED Provider Notes (Signed)
History     CSN: [id number]  Arrival date & time 04/25/13  2440   First MD Initiated Contact with Patient 04/25/13 2034      Chief Complaint  Patient presents with  . Hand Pain    (Consider location/radiation/quality/duration/timing/severity/associated sxs/prior treatment) HPI [name] is a 57 y.o. female who presents to the ED with right hand pain. The pain started 5 days ago. The middle and ring finger are swollen and painful. She does not remember any injury to the area. 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 The history was provided by the patient.   Past Medical History  Diagnosis Date  . Diabetes mellitus   . Hypertension   . Asthma   . Overactive bladder   . High cholesterol   . Fibromyalgia   . Panic attacks   . Obstructive sleep apnea     Noncompliant with CPAP due to claustophobia.  . H/O cardiac catheterization     No significant CAD (only 20% LAD) 03/02/13 with normal LV function.    Past Surgical History  Procedure Laterality Date  . Abdominal hysterectomy    . Knee replacements      bilateral  . Back surgery    . Tonsillectomy    . Carpal tunnel release    . Ankle surgery      Family History  Problem Relation Age of Onset  . Heart attack Mother   . Sick sinus syndrome Brother     Pacemaker  . Congenital heart disease Brother     CABG    History  Substance Use Topics  . Smoking status: Former Games developer  . Smokeless tobacco: Not on file  . Alcohol Use: No    OB History   Grav Para Term Preterm Abortions TAB SAB Ect Mult Living                  Review of Systems  Constitutional: Negative for fever and chills.  HENT: Negative for neck pain.   Cardiovascular: Negative for chest pain.  Gastrointestinal: Negative for nausea and vomiting.  Musculoskeletal:       Right hand pain  Skin: Negative for wound.  Psychiatric/Behavioral: The patient is not nervous/anxious.      Allergies  Aspirin and Codeine  Home Medications   Current Outpatient Rx  Name  Route  Sig  Dispense  Refill  . atorvastatin (LIPITOR) 10 MG tablet   Oral   Take 10 mg by mouth at bedtime.         Marland Kitchen Fesoterodine Fumarate (TOVIAZ) 8 MG TB24   Oral   Take 8 mg by mouth daily.          Marland Kitchen glimepiride (AMARYL) 2 MG tablet   Oral   Take 1 mg by mouth daily.          Marland Kitchen imipramine (TOFRANIL) 50 MG tablet   Oral   Take 50 mg by mouth at bedtime.         . metoprolol (LOPRESSOR) 50 MG tablet   Oral   Take 50 mg by mouth 2 (two) times daily.           Marland Kitchen albuterol (PROVENTIL HFA;VENTOLIN HFA) 108 (90 BASE) MCG/ACT inhaler   Inhalation   Inhale 2 puffs into the lungs every 4 (four) hours as needed. Shortness of Breath            BP 130/83  Pulse 91  Temp(Src) 97 F (36.1 C) (Oral)  Resp 16  Ht 5\' 7"  (1.702 m)  Wt 285 lb (129.275 kg)  BMI 44.63 kg/m2  SpO2 97%  Physical Exam  Nursing note and vitals reviewed. Constitutional: She is oriented to person, place, and time. She appears well-developed and well-nourished. No distress.  HENT:  Head: Normocephalic.  Eyes: EOM are normal.  Neck: Neck supple.  Cardiovascular: Normal rate.   Pulmonary/Chest: Effort normal.  Musculoskeletal:       Right hand: She exhibits decreased range of motion, tenderness and swelling. She exhibits normal capillary refill and no laceration. Normal sensation noted. Normal strength noted.       Hands: Tenderness and some swelling noted to dorsal aspect of right hand. The tenderness is greater in the ring finger. There is no ecchymosis noted. Radial pulses are strong and equal bilateral. Adequate circulation, good touch sensation. Grip slightly decreased due to pain.   Neurological: She is alert and oriented to person, place, and time. No cranial nerve deficit or sensory deficit.  Skin: Skin is warm and dry.  Psychiatric: She has a normal mood and affect. Her behavior is normal.  Judgment and thought content normal.    ED Course  Procedures (including critical care time) Dg Hand Complete Right  04/25/2013   *RADIOLOGY REPORT*  Clinical Data: Hand pain.  No known injury.  RIGHT HAND - COMPLETE 3+ VIEW  Comparison: None  Findings: Old ulnar styloid fracture.  No acute fracture, subluxation or dislocation.  Joint spaces are maintained.  Soft tissues are intact.  IMPRESSION: No acute bony abnormality.   Original Report Authenticated By: Charlett Nose, M.D.    Assessment: 57 y.o. female with right hand pain   Tendonitis  Plan:  Pain management   Ice, elevate   Follow up with PCP  MDM  I have reviewed this patient's vital signs, nurses notes, appropriate imaging and discussed findings and plan of care with the patient. She voices understanding.   Medication List    TAKE these medications       predniSONE 10 MG tablet  Commonly known as:  DELTASONE  Take 2 tablets (20 mg total) by mouth 2 (two) times daily.      ASK your doctor about these medications       albuterol 108 (90 BASE) MCG/ACT inhaler  Commonly known as:  PROVENTIL HFA;VENTOLIN HFA  Inhale 2 puffs into the lungs every 4 (four) hours as needed. Shortness of Breath     atorvastatin 10 MG tablet  Commonly known as:  LIPITOR  Take 10 mg by mouth at bedtime.     glimepiride 2 MG tablet  Commonly known as:  AMARYL  Take 1 mg by mouth daily.     imipramine 50 MG tablet  Commonly known as:  TOFRANIL  Take 50 mg by mouth at bedtime.     metoprolol 50 MG tablet  Commonly known as:  LOPRESSOR  Take 50 mg by mouth 2 (two) times daily.     TOVIAZ 8 MG Tb24  Generic drug:  fesoterodine  Take 8 mg by mouth daily.                 [place], [place] 04/25/13 [phone]

## 2013-04-26 NOTE — ED Provider Notes (Signed)
Medical screening examination/treatment/procedure(s) were performed by non-physician practitioner and as supervising physician I was immediately available for consultation/collaboration.  Jaizon Deroos, MD 04/26/13 1716 

## 2013-04-27 ENCOUNTER — Ambulatory Visit (HOSPITAL_COMMUNITY)
Admission: RE | Admit: 2013-04-27 | Discharge: 2013-04-27 | Disposition: A | Discharge status: Home / Self Care | Payer: Medicare Other | Source: Ambulatory Visit | Attending: Internal Medicine | Admitting: Internal Medicine

## 2013-04-27 DIAGNOSIS — R262 Difficulty in walking, not elsewhere classified: Secondary | ICD-10-CM

## 2013-04-27 DIAGNOSIS — R29898 Other symptoms and signs involving the musculoskeletal system: Secondary | ICD-10-CM

## 2013-04-27 NOTE — Progress Notes (Signed)
Physical Therapy Treatment Patient Details  Name: [name] MRN: [id number] Date of Birth: 1956/08/19 Charges:  There ex x 39 Today's Date: 04/27/2013 Time: 1302-1342 PT Time Calculation (min): 40 min  Visit#: 5 of 12  Re-eval: 05/11/13    Authorization: [employer] medicare  Authorization Time Period:    Authorization Visit#: 5 of 10   Subjective: Symptoms/Limitations Symptoms: Pt states she has been doing a lot of yard work and gardening which has started her knees hurting.  Pt has not been able to come to treatment due to car not working properly. Pain Assessment Currently in Pain?: Yes Pain Score:   5 Pain Location: Knee Pain Orientation: Right;Left    Exercise/Treatments    Aerobic Stationary Bike: Nustep L 3 x 10' (Nustep L 2 x 10')   Standing Heel Raises: 10 reps Knee Flexion: Both;15 reps;Limitations Knee Flexion Limitations: 4# wt Forward Lunges: 10 reps Side Lunges: 10 reps Lateral Step Up: Both;10 reps;Step Height: 6" Forward Step Up: Both;10 reps;Step Height: 6" Functional Squat: 15 reps Rocker Board: 2 minutes SLS with Vectors: 3 x 10"    Physical Therapy Assessment and Plan PT Assessment and Plan Clinical Impression Statement: PT needed verbal cuing for step ups not to allow LE to hyperextend  and lunges not to allow knees to give in medially.  Increased step up height and vector stance time; Pt will benefit from skilled therapeutic intervention in order to improve on the following deficits: Decreased balance;Pain;Obesity;Decreased strength;Difficulty walking PT Frequency: Min 2X/week PT Duration: 4 weeks PT Treatment/Interventions: Stair training;Therapeutic activities;Therapeutic exercise;Patient/family education;Balance training PT Plan: begin steps in department    Goals  progressing  Problem List Patient Active Problem List   Diagnosis Date Noted  . Difficulty in walking 04/11/2013  . Leg weakness, bilateral 04/11/2013  . Chest pain,  unspecified 02/28/2013  . Diabetes 02/28/2013  . Morbid obesity 02/28/2013  . Other and unspecified hyperlipidemia 02/28/2013  . CHRONIC OSTEOMYELITIS ANKLE AND FOOT 07/22/2009  . HIGH BLOOD PRESSURE 07/22/2009    PT - End of Session Activity Tolerance: Patient tolerated treatment well  GP    Isaiahs Chancy,CINDY 04/27/2013, 1:45 PM

## 2013-04-30 ENCOUNTER — Ambulatory Visit (HOSPITAL_COMMUNITY)
Admission: RE | Admit: 2013-04-30 | Discharge: 2013-04-30 | Disposition: A | Discharge status: Home / Self Care | Payer: Medicare Other | Source: Ambulatory Visit | Attending: Internal Medicine | Admitting: Internal Medicine

## 2013-04-30 NOTE — Progress Notes (Signed)
Physical Therapy Treatment Patient Details  Name: [name] MRN: [id number] Date of Birth: 07-02-1956  Today's Date: 04/30/2013 Time: 1300-1340 PT Time Calculation (min): 40 min  Visit#: 6 of 12  Re-eval: 05/11/13 Charges: Therex x 38'  Authorization: [employer] medicare  Authorization Visit#: 6 of 10   Subjective: Symptoms/Limitations Symptoms: Pt states that she just left to pool so she if pain free. Pain Assessment Currently in Pain?: No/denies  Precautions/Restrictions     Exercise/Treatments Aerobic Stationary Bike: Nustep L 3 x 10' Standing Heel Raises: 10 reps Knee Flexion: Both;15 reps;Limitations Knee Flexion Limitations: 5# wt Forward Lunges: 10 reps Side Lunges: 10 reps Lateral Step Up: Both;10 reps;Step Height: 6" Forward Step Up: Both;10 reps;Step Height: 6" Functional Squat: 15 reps Stairs: 2RT Rocker Board: 2 minutes SLS with Vectors: 3 x 10"  Physical Therapy Assessment and Plan PT Assessment and Plan Clinical Impression Statement: Pt completes therex well after initial cueing and demo. Pt is without complaint throughout session. Progressed to stair straining. Pt ascends stairs without difficulty but utilizes step-to pattern with descent. After cuing pt is able to displays reciprocal pattern but this pattern is deviated due to weakness. Pt will benefit from skilled therapeutic intervention in order to improve on the following deficits: Decreased balance;Pain;Obesity;Decreased strength;Difficulty walking PT Frequency: Min 2X/week PT Duration: 4 weeks PT Treatment/Interventions: Stair training;Therapeutic activities;Therapeutic exercise;Patient/family education;Balance training PT Plan: Continue to progress BLE strength per PT POC.     Problem List Patient Active Problem List   Diagnosis Date Noted  . Difficulty in walking 04/11/2013  . Leg weakness, bilateral 04/11/2013  . Chest pain, unspecified 02/28/2013  . Diabetes 02/28/2013  . Morbid  obesity 02/28/2013  . Other and unspecified hyperlipidemia 02/28/2013  . CHRONIC OSTEOMYELITIS ANKLE AND FOOT 07/22/2009  . HIGH BLOOD PRESSURE 07/22/2009    PT - End of Session Activity Tolerance: Patient tolerated treatment well General Behavior During Therapy: [facility] for tasks assessed/performed Cognition: WFL for tasks performed  Seth Bake, PTA 04/30/2013, 1:48 PM

## 2013-05-02 ENCOUNTER — Inpatient Hospital Stay (HOSPITAL_COMMUNITY): Admission: RE | Admit: 2013-05-02 | Payer: Medicare Other | Source: Ambulatory Visit

## 2013-05-02 ENCOUNTER — Telehealth (HOSPITAL_COMMUNITY): Payer: Self-pay

## 2013-05-04 ENCOUNTER — Telehealth (HOSPITAL_COMMUNITY): Payer: Self-pay

## 2013-05-04 ENCOUNTER — Ambulatory Visit (HOSPITAL_COMMUNITY): Payer: Medicare Other | Admitting: Physical Therapy

## 2013-05-09 ENCOUNTER — Inpatient Hospital Stay (HOSPITAL_COMMUNITY): Admission: RE | Admit: 2013-05-09 | Payer: Medicare Other | Source: Ambulatory Visit

## 2013-05-11 ENCOUNTER — Inpatient Hospital Stay (HOSPITAL_COMMUNITY): Admission: RE | Admit: 2013-05-11 | Payer: Medicare Other | Source: Ambulatory Visit | Admitting: Physical Therapy

## 2013-05-21 ENCOUNTER — Ambulatory Visit (INDEPENDENT_AMBULATORY_CARE_PROVIDER_SITE_OTHER): Payer: Medicare Other | Admitting: Adult Health

## 2013-05-21 ENCOUNTER — Encounter: Payer: Self-pay | Admitting: *Deleted

## 2013-05-21 ENCOUNTER — Encounter: Payer: Self-pay | Admitting: Adult Health

## 2013-05-21 VITALS — BP 122/78 | HR 80 | Ht 67.0 in | Wt 291.0 lb

## 2013-05-21 DIAGNOSIS — Z1212 Encounter for screening for malignant neoplasm of rectum: Secondary | ICD-10-CM

## 2013-05-21 DIAGNOSIS — N816 Rectocele: Secondary | ICD-10-CM

## 2013-05-21 DIAGNOSIS — Z01419 Encounter for gynecological examination (general) (routine) without abnormal findings: Secondary | ICD-10-CM

## 2013-05-21 DIAGNOSIS — B369 Superficial mycosis, unspecified: Secondary | ICD-10-CM

## 2013-05-21 DIAGNOSIS — N3281 Overactive bladder: Secondary | ICD-10-CM

## 2013-05-21 HISTORY — DX: Rectocele: N81.6

## 2013-05-21 HISTORY — DX: Overactive bladder: N32.81

## 2013-05-21 LAB — HEMOCCULT GUIAC POC 1CARD (OFFICE)

## 2013-05-21 MED ORDER — MIRABEGRON ER 25 MG PO TB24: 25.0000 mg | ORAL_TABLET | Freq: Every day | ORAL | Status: DC

## 2013-05-21 MED ORDER — NYSTATIN 100000 UNIT/GM EX CREA: TOPICAL_CREAM | Freq: Two times a day (BID) | CUTANEOUS | Status: DC

## 2013-05-21 NOTE — Patient Instructions (Addendum)
Physical in 1 year Mammogram yearly Colonoscopy  As per GI Labs with Dr. [name] Call with results of mytbetriq

## 2013-05-21 NOTE — Progress Notes (Signed)
Patient ID: [name], female   DOB: 1956/01/19, 57 y.o.   MRN: [id number] History of Present Illness: [name] is a 57 year old white female, married in for a physical. Her husband is disabled also.  Current Medications, Allergies, Past Medical History, Past Surgical History, Family History and Social History were reviewed in [employer] record.     Review of Systems: Patient denies any headaches, blurred vision, shortness of breath, chest pain, abdominal pain, problems with bowel movements.She is up at night every hour to pee, she is not having sex.She has body aches, and she recently had a panic attack and was seen in the ER. She has a skin irritation at her navel.  Physical Exam:BP 122/78  Pulse 80  Ht 5\' 7"  (1.702 m)  Wt 291 lb (131.997 kg)  BMI 45.57 kg/m2 General:  Well developed, well nourished, no acute distress Skin:  Warm and dry Neck:  Midline trachea, normal thyroid, no carotid bruits heard. Lungs; Clear to auscultation bilaterally Breast:  No dominant palpable mass, retraction, or nipple discharge Cardiovascular: Regular rate and rhythm Abdomen:  Soft, non tender, no hepatosplenomegaly, obese with what looks to be as skin fungus at navel. Pelvic:  External genitalia is normal in appearance.  The vagina has decrease color, moisture and rugae. The cervix  and uterus are absent. No  adnexal masses or tenderness noted. Rectal: Good sphincter tone, no polyps, or hemorrhoids felt.  Hemoccult negative.She has a rectocele Extremities:  No swelling  noted Psych: alert and cooperative, seems happy  Impression: Yearly gyn exam-no pap Rectocele Skin fungus OAB History of DM,hypertesion, elevated cholesterol, fibromyalgia and asthma Recent panic attack  Plan: Physical yearly  Mammogram yearly Labs with PCP Colonoscopy as per GI Discussed rectocele with medical explainer #4 Rx nystatin cream bid to navel area as needed Trial myrbetriq, Number of  samples 1  Lot number [id number]   Exp date 6/15, try 1 daily, stop imipramine and let me know if it works better

## 2013-06-07 ENCOUNTER — Other Ambulatory Visit (HOSPITAL_COMMUNITY): Payer: Self-pay | Admitting: Orthopaedic Surgery

## 2013-06-07 DIAGNOSIS — M25561 Pain in right knee: Secondary | ICD-10-CM

## 2013-06-18 ENCOUNTER — Encounter (HOSPITAL_COMMUNITY)
Admission: RE | Admit: 2013-06-18 | Discharge: 2013-06-18 | Disposition: A | Discharge status: Home / Self Care | Payer: Medicare Other | Source: Ambulatory Visit | Attending: Orthopaedic Surgery | Admitting: Orthopaedic Surgery

## 2013-06-18 DIAGNOSIS — Z96659 Presence of unspecified artificial knee joint: Secondary | ICD-10-CM | POA: Insufficient documentation

## 2013-06-18 DIAGNOSIS — M25569 Pain in unspecified knee: Secondary | ICD-10-CM | POA: Insufficient documentation

## 2013-06-18 DIAGNOSIS — M25561 Pain in right knee: Secondary | ICD-10-CM

## 2013-06-18 MED ORDER — TECHNETIUM TC 99M MEDRONATE IV KIT
25.0000 | PACK | Freq: Once | INTRAVENOUS | Status: AC | PRN
  Administered 2013-06-18: 25 via INTRAVENOUS

## 2013-07-15 ENCOUNTER — Encounter (HOSPITAL_COMMUNITY): Payer: Self-pay | Admitting: *Deleted

## 2013-07-15 ENCOUNTER — Emergency Department (HOSPITAL_COMMUNITY)
Admission: EM | Admit: 2013-07-15 | Discharge: 2013-07-15 | Disposition: A | Discharge status: Home / Self Care | Payer: Medicare Other | Attending: Emergency Medicine | Admitting: Emergency Medicine

## 2013-07-15 DIAGNOSIS — F410 Panic disorder [episodic paroxysmal anxiety] without agoraphobia: Secondary | ICD-10-CM | POA: Insufficient documentation

## 2013-07-15 DIAGNOSIS — M25511 Pain in right shoulder: Secondary | ICD-10-CM

## 2013-07-15 DIAGNOSIS — IMO0002 Reserved for concepts with insufficient information to code with codable children: Secondary | ICD-10-CM | POA: Insufficient documentation

## 2013-07-15 DIAGNOSIS — J45909 Unspecified asthma, uncomplicated: Secondary | ICD-10-CM | POA: Insufficient documentation

## 2013-07-15 DIAGNOSIS — M25519 Pain in unspecified shoulder: Secondary | ICD-10-CM | POA: Insufficient documentation

## 2013-07-15 DIAGNOSIS — E119 Type 2 diabetes mellitus without complications: Secondary | ICD-10-CM | POA: Insufficient documentation

## 2013-07-15 DIAGNOSIS — Z87891 Personal history of nicotine dependence: Secondary | ICD-10-CM | POA: Insufficient documentation

## 2013-07-15 DIAGNOSIS — IMO0001 Reserved for inherently not codable concepts without codable children: Secondary | ICD-10-CM | POA: Insufficient documentation

## 2013-07-15 DIAGNOSIS — Z79899 Other long term (current) drug therapy: Secondary | ICD-10-CM | POA: Insufficient documentation

## 2013-07-15 DIAGNOSIS — E7800 Pure hypercholesterolemia, unspecified: Secondary | ICD-10-CM | POA: Insufficient documentation

## 2013-07-15 DIAGNOSIS — Z8742 Personal history of other diseases of the female genital tract: Secondary | ICD-10-CM | POA: Insufficient documentation

## 2013-07-15 DIAGNOSIS — I10 Essential (primary) hypertension: Secondary | ICD-10-CM | POA: Insufficient documentation

## 2013-07-15 DIAGNOSIS — G4733 Obstructive sleep apnea (adult) (pediatric): Secondary | ICD-10-CM | POA: Insufficient documentation

## 2013-07-15 DIAGNOSIS — Z87448 Personal history of other diseases of urinary system: Secondary | ICD-10-CM | POA: Insufficient documentation

## 2013-07-15 MED ORDER — IBUPROFEN 800 MG PO TABS
800.0000 mg | ORAL_TABLET | Freq: Once | ORAL | Status: AC
  Administered 2013-07-15: 800 mg via ORAL
  Filled 2013-07-15: qty 1

## 2013-07-15 NOTE — ED Provider Notes (Signed)
CSN: [id number]     Arrival date & time 07/15/13  1101 History    This chart was scribed for Joya Gaskins, MD by Quintella Reichert, ED scribe.  This patient was seen in room APA03/[facility]03 and the patient's care was started at 11:53 AM.   Chief Complaint  Patient presents with  . Shoulder Pain    Patient is a 57 y.o. female presenting with shoulder pain. The history is provided by the patient. No language interpreter was used.  Shoulder Pain This is a new problem. The current episode started 3 to 5 hours ago. The problem occurs constantly. The problem has not changed since onset.Pertinent negatives include no chest pain, no abdominal pain, no headaches and no shortness of breath. Exacerbated by: Movement. Nothing relieves the symptoms. She has tried nothing for the symptoms.    HPI Comments: [name] is a 57 y.o. female with h/o fibromyalgia who presents to the Emergency Department complaining of constant moderate right shoulder pain that began this morning.  Pt reports that she felt fine last night and pain was present on waking this morning.  Pain is exacerbated by movement.   She denies recent falls or injuries and states she has not experienced similar pain previously.  She denies weakness, numbness or tingling to arm.  She denies new focal weakness, CP, SOB, or HA.  She has not attempted to treat symptoms pta.  Pt notes that she was recently placed on a new thyroid medication (levothyroxine).   Past Medical History  Diagnosis Date  . Diabetes mellitus   . Hypertension   . Asthma   . Overactive bladder   . High cholesterol   . Fibromyalgia   . Panic attacks   . Obstructive sleep apnea     Noncompliant with CPAP due to claustophobia.  . H/O cardiac catheterization     No significant CAD (only 20% LAD) 03/02/13 with normal LV function.  . Rectocele 05/21/2013  . OAB (overactive bladder) 05/21/2013    Past Surgical History  Procedure Laterality Date  . Abdominal hysterectomy     . Knee replacements      bilateral  . Back surgery    . Tonsillectomy    . Carpal tunnel release    . Ankle surgery      Family History  Problem Relation Age of Onset  . Heart attack Mother   . Diabetes Mother   . Hypertension Mother   . Sick sinus syndrome Brother     Pacemaker  . Congenital heart disease Brother     CABG  . Cancer Maternal Aunt     breast, liver,lung  . Cancer Cousin     leukemia  . Stroke Brother   . Coronary artery disease Brother     History  Substance Use Topics  . Smoking status: Former Smoker -- 1 years    Types: Cigarettes  . Smokeless tobacco: Never Used  . Alcohol Use: No    OB History   Grav Para Term Preterm Abortions TAB SAB Ect Mult Living   2 2               Review of Systems  Respiratory: Negative for shortness of breath.   Cardiovascular: Negative for chest pain.  Gastrointestinal: Negative for abdominal pain.  Musculoskeletal: Positive for arthralgias.  Neurological: Negative for weakness and headaches.      Allergies  Aspirin and Codeine  Home Medications   Current Outpatient Rx  Name  Route  Sig  Dispense  Refill  . albuterol (PROVENTIL HFA;VENTOLIN HFA) 108 (90 BASE) MCG/ACT inhaler   Inhalation   Inhale 2 puffs into the lungs every 4 (four) hours as needed. Shortness of Breath          . atorvastatin (LIPITOR) 10 MG tablet   Oral   Take 10 mg by mouth at bedtime.         Marland Kitchen Fesoterodine Fumarate (TOVIAZ) 8 MG TB24   Oral   Take 8 mg by mouth daily.          Marland Kitchen glimepiride (AMARYL) 2 MG tablet   Oral   Take 1 mg by mouth daily.          Marland Kitchen imipramine (TOFRANIL) 50 MG tablet   Oral   Take 50 mg by mouth at bedtime.         . metoprolol (LOPRESSOR) 50 MG tablet   Oral   Take 50 mg by mouth 2 (two) times daily.           . mirabegron ER (MYRBETRIQ) 25 MG TB24   Oral   Take 1 tablet (25 mg total) by mouth daily.   7 tablet   0   . nystatin cream (MYCOSTATIN)   Topical   Apply  topically 2 (two) times daily.   30 g   2   . omeprazole (PRILOSEC) 20 MG capsule               . predniSONE (DELTASONE) 10 MG tablet   Oral   Take 2 tablets (20 mg total) by mouth 2 (two) times daily.   20 tablet   0    BP 119/90  Pulse 76  Temp(Src) 97.6 F (36.4 C) (Oral)  Resp 20  Ht 5\' 7"  (1.702 m)  Wt 289 lb (131.09 kg)  BMI 45.25 kg/m2  SpO2 99%   Physical Exam  Nursing note and vitals reviewed. CONSTITUTIONAL: Well developed/well nourished HEAD: Normocephalic/atraumatic EYES: EOMI/PERRL ENMT: Mucous membranes moist NECK: supple no meningeal signs SPINE:entire spine nontender.  Cervical paraspinal tenderness. CV: S1/S2 noted, no murmurs/rubs/gallops noted LUNGS: Lungs are clear to auscultation bilaterally, no apparent distress ABDOMEN: soft, nontender, no rebound or guarding NEURO: Pt is awake/alert, moves all extremitiesx4.  Equal power with hand grip, wrist flex/extension, elbow flex/extension, and equal power with shoulder abduction/adduction.  No focal sensory deficit is noted in either UE.   Equal biceps/brachioradial reflex in bilateral UE EXTREMITIES: pulses normal, full ROM.  Mild tenderness to right deltoid.  No deformity, no crepitance, full ROM noted in right shoulder. No warmth or edema noted SKIN: warm, color normal PSYCH: no abnormalities of mood noted    ED Course  Procedures  DIAGNOSTIC STUDIES: Oxygen Saturation is 99% on room air, normal by my interpretation.    COORDINATION OF CARE: 11:57 AM-Discussed treatment plan which includes ibuprofen with pt at bedside and pt agreed to plan.  No signs of trauma, do not feel imaging warranted She denies CP/SOB No focal bony deformity or neuro deficit She does not want pain meds and reports she can tolerate ibuprofen Advised course of 5 days of ibuprofen and frequent use of heating pad (10-15 minutes at a time) Stable for d/c    MDM  Nursing notes including past medical history and social  history reviewed and considered in documentation     I personally performed the services described in this documentation, which was scribed in my presence. The recorded information has been reviewed and is  accurate.      Joya Gaskins, MD 07/15/13 1324

## 2013-07-15 NOTE — ED Notes (Signed)
Pt c/o right shoulder pain that radiates to right upper back area and neck, pain is worse with movement and palpation of right shoulder, denies any injury, states that the pain woke her up from sleep.

## 2013-08-06 ENCOUNTER — Other Ambulatory Visit: Payer: Self-pay | Admitting: Adult Health

## 2013-10-09 ENCOUNTER — Other Ambulatory Visit: Payer: Self-pay | Admitting: Obstetrics and Gynecology

## 2013-10-10 NOTE — Telephone Encounter (Signed)
Will route to jennifer griffin for eval

## 2013-11-12 ENCOUNTER — Telehealth: Payer: Self-pay | Admitting: Adult Health

## 2013-11-12 ENCOUNTER — Other Ambulatory Visit: Payer: Self-pay | Admitting: Obstetrics and Gynecology

## 2013-11-12 NOTE — Telephone Encounter (Signed)
Pt states that one of the medications for her bladder the Dr. Emelda Fear denied it.

## 2013-11-12 NOTE — Telephone Encounter (Signed)
Pt aware needs to be seen it has been over 2 years, and she said she would call back in am and make appt

## 2013-11-16 ENCOUNTER — Ambulatory Visit: Payer: Medicare Other | Admitting: Adult Health

## 2013-11-19 ENCOUNTER — Ambulatory Visit (INDEPENDENT_AMBULATORY_CARE_PROVIDER_SITE_OTHER): Payer: Medicare Other | Admitting: Adult Health

## 2013-11-19 ENCOUNTER — Encounter: Payer: Self-pay | Admitting: Adult Health

## 2013-11-19 VITALS — BP 110/72 | Ht 67.0 in | Wt 297.0 lb

## 2013-11-19 DIAGNOSIS — N318 Other neuromuscular dysfunction of bladder: Secondary | ICD-10-CM

## 2013-11-19 DIAGNOSIS — N3281 Overactive bladder: Secondary | ICD-10-CM

## 2013-11-19 DIAGNOSIS — B369 Superficial mycosis, unspecified: Secondary | ICD-10-CM

## 2013-11-19 MED ORDER — IMIPRAMINE HCL 50 MG PO TABS: 50.0000 mg | ORAL_TABLET | Freq: Every day | ORAL | Status: DC

## 2013-11-19 MED ORDER — NYSTATIN 100000 UNIT/GM EX POWD: 1.0000 g | Freq: Three times a day (TID) | CUTANEOUS | Status: DC

## 2013-11-19 NOTE — Progress Notes (Signed)
Subjective:     Patient ID: [name], female   DOB: 08-16-1956, 57 y.o.   MRN: [id number]  HPI [name] is a 57 year ol white female in for OAB, she needs refill on her imipramine which works well And she is complaining of a skin fungus in navel and under stomach apron.  Review of Systems See HPI Reviewed past medical,surgical, social and family history. Reviewed medications and allergies. Past Medical History  Diagnosis Date  . Diabetes mellitus   . Hypertension   . Asthma   . Overactive bladder   . High cholesterol   . Fibromyalgia   . Panic attacks   . Obstructive sleep apnea     Noncompliant with CPAP due to claustophobia.  . H/O cardiac catheterization     No significant CAD (only 20% LAD) 03/02/13 with normal LV function.  . Rectocele 05/21/2013  . OAB (overactive bladder) 05/21/2013   Past Surgical History  Procedure Laterality Date  . Abdominal hysterectomy    . Knee replacements      bilateral  . Back surgery    . Tonsillectomy    . Carpal tunnel release    . Ankle surgery    Current outpatient prescriptions:atorvastatin (LIPITOR) 10 MG tablet, Take 10 mg by mouth at bedtime., Disp: , Rfl: ;  glimepiride (AMARYL) 2 MG tablet, Take 1 mg by mouth daily. , Disp: , Rfl: ;  imipramine (TOFRANIL) 50 MG tablet, Take 1 tablet (50 mg total) by mouth at bedtime., Disp: 30 tablet, Rfl: 6;  metoprolol (LOPRESSOR) 50 MG tablet, Take 50 mg by mouth 2 (two) times daily.  , Disp: , Rfl:  TOVIAZ 8 MG TB24 tablet, TAKE ONE TABLET BY MOUTH ONCE DAILY., Disp: 30 tablet, Rfl: 6;  albuterol (PROVENTIL HFA;VENTOLIN HFA) 108 (90 BASE) MCG/ACT inhaler, Inhale 2 puffs into the lungs every 4 (four) hours as needed. Shortness of Breath , Disp: , Rfl: ;  mirabegron ER (MYRBETRIQ) 25 MG TB24, Take 1 tablet (25 mg total) by mouth daily., Disp: 7 tablet, Rfl: 0 nystatin (MYCOSTATIN/NYSTOP) 100000 UNIT/GM POWD, Apply 1 g topically 3 (three) times daily., Disp: 30 g, Rfl: prn;  omeprazole (PRILOSEC) 20 MG  capsule, , Disp: , Rfl: ;  predniSONE (DELTASONE) 10 MG tablet, Take 2 tablets (20 mg total) by mouth 2 (two) times daily., Disp: 20 tablet, Rfl: 0;  [DISCONTINUED] Fesoterodine Fumarate (TOVIAZ) 8 MG TB24, Take 8 mg by mouth daily. , Disp: , Rfl:     Objective:   Physical Exam BP 110/72  Ht 5\' 7"  (1.702 m)  Wt 297 lb (134.718 kg)  BMI 46.51 kg/m2   has skin fundus at navel and under stomach, wants refill on imipramine, she says the nystatin cream not helping so much will try powder Assessment:     OAB Skin fungus    Plan:     Refilled imipramine 50 mg 1 at hs x 6 Rx nystatin powder tid with refills, if does not work call and will paint with gentian violet Follow in 6 months for physical

## 2013-11-19 NOTE — Patient Instructions (Signed)
Follow up in 6 months for physical Call prn problems

## 2014-03-09 ENCOUNTER — Other Ambulatory Visit: Payer: Self-pay | Admitting: Adult Health

## 2014-04-11 ENCOUNTER — Emergency Department (HOSPITAL_COMMUNITY)
Admission: EM | Admit: 2014-04-11 | Discharge: 2014-04-11 | Disposition: A | Discharge status: Home / Self Care | Payer: Medicare HMO | Attending: Emergency Medicine | Admitting: Emergency Medicine

## 2014-04-11 ENCOUNTER — Encounter (HOSPITAL_COMMUNITY): Payer: Self-pay | Admitting: Emergency Medicine

## 2014-04-11 DIAGNOSIS — Z79899 Other long term (current) drug therapy: Secondary | ICD-10-CM | POA: Insufficient documentation

## 2014-04-11 DIAGNOSIS — I10 Essential (primary) hypertension: Secondary | ICD-10-CM | POA: Insufficient documentation

## 2014-04-11 DIAGNOSIS — IMO0001 Reserved for inherently not codable concepts without codable children: Secondary | ICD-10-CM | POA: Insufficient documentation

## 2014-04-11 DIAGNOSIS — E7800 Pure hypercholesterolemia, unspecified: Secondary | ICD-10-CM | POA: Insufficient documentation

## 2014-04-11 DIAGNOSIS — Z23 Encounter for immunization: Secondary | ICD-10-CM | POA: Insufficient documentation

## 2014-04-11 DIAGNOSIS — Z9889 Other specified postprocedural states: Secondary | ICD-10-CM | POA: Insufficient documentation

## 2014-04-11 DIAGNOSIS — Z87891 Personal history of nicotine dependence: Secondary | ICD-10-CM | POA: Insufficient documentation

## 2014-04-11 DIAGNOSIS — IMO0002 Reserved for concepts with insufficient information to code with codable children: Secondary | ICD-10-CM | POA: Insufficient documentation

## 2014-04-11 DIAGNOSIS — W278XXA Contact with other nonpowered hand tool, initial encounter: Secondary | ICD-10-CM | POA: Insufficient documentation

## 2014-04-11 DIAGNOSIS — N318 Other neuromuscular dysfunction of bladder: Secondary | ICD-10-CM | POA: Insufficient documentation

## 2014-04-11 DIAGNOSIS — G4733 Obstructive sleep apnea (adult) (pediatric): Secondary | ICD-10-CM | POA: Insufficient documentation

## 2014-04-11 DIAGNOSIS — Z9119 Patient's noncompliance with other medical treatment and regimen: Secondary | ICD-10-CM | POA: Insufficient documentation

## 2014-04-11 DIAGNOSIS — Y9389 Activity, other specified: Secondary | ICD-10-CM | POA: Insufficient documentation

## 2014-04-11 DIAGNOSIS — Z91199 Patient's noncompliance with other medical treatment and regimen due to unspecified reason: Secondary | ICD-10-CM | POA: Insufficient documentation

## 2014-04-11 DIAGNOSIS — E119 Type 2 diabetes mellitus without complications: Secondary | ICD-10-CM | POA: Insufficient documentation

## 2014-04-11 DIAGNOSIS — S61209A Unspecified open wound of unspecified finger without damage to nail, initial encounter: Secondary | ICD-10-CM | POA: Insufficient documentation

## 2014-04-11 DIAGNOSIS — J45909 Unspecified asthma, uncomplicated: Secondary | ICD-10-CM | POA: Insufficient documentation

## 2014-04-11 DIAGNOSIS — Y9289 Other specified places as the place of occurrence of the external cause: Secondary | ICD-10-CM | POA: Insufficient documentation

## 2014-04-11 DIAGNOSIS — F410 Panic disorder [episodic paroxysmal anxiety] without agoraphobia: Secondary | ICD-10-CM | POA: Insufficient documentation

## 2014-04-11 LAB — CBG MONITORING, ED: GLUCOSE-CAPILLARY: 92 mg/dL (ref 70–99)

## 2014-04-11 MED ORDER — LIDOCAINE HCL (PF) 2 % IJ SOLN
INTRAMUSCULAR | Status: AC
  Filled 2014-04-11: qty 10

## 2014-04-11 MED ORDER — TETANUS-DIPHTH-ACELL PERTUSSIS 5-2.5-18.5 LF-MCG/0.5 IM SUSP
0.5000 mL | Freq: Once | INTRAMUSCULAR | Status: AC
  Administered 2014-04-11: 0.5 mL via INTRAMUSCULAR
  Filled 2014-04-11: qty 0.5

## 2014-04-11 MED ORDER — LIDOCAINE HCL (PF) 2 % IJ SOLN: 2.0000 mL | Freq: Once | INTRAMUSCULAR | Status: DC

## 2014-04-11 NOTE — ED Provider Notes (Signed)
CSN: [id number]     Arrival date & time 04/11/14  1630 History   First MD Initiated Contact with Patient 04/11/14 1653     Chief Complaint  Patient presents with  . Laceration     (Consider location/radiation/quality/duration/timing/severity/associated sxs/prior Treatment) Patient is a 58 y.o. female presenting with skin laceration. The history is provided by the patient.  Laceration Location:  Hand Hand laceration location:  R finger Depth:  Through dermis Bleeding: controlled with pressure   Time since incident:  1 hour Injury mechanism: vegatable slicer. Pain details:    Quality:  Aching and throbbing   Severity:  Moderate   Timing:  Constant   Subjective pain progression: It continues to bleed if she bumps it. Foreign body present:  No foreign bodies Relieved by:  Pressure Tetanus status:  Out of date   Past Medical History  Diagnosis Date  . Diabetes mellitus   . Hypertension   . Asthma   . Overactive bladder   . High cholesterol   . Fibromyalgia   . Panic attacks   . Obstructive sleep apnea     Noncompliant with CPAP due to claustophobia.  . H/O cardiac catheterization     No significant CAD (only 20% LAD) 03/02/13 with normal LV function.  . Rectocele 05/21/2013  . OAB (overactive bladder) 05/21/2013   Past Surgical History  Procedure Laterality Date  . Abdominal hysterectomy    . Knee replacements      bilateral  . Back surgery    . Tonsillectomy    . Carpal tunnel release    . Ankle surgery     Family History  Problem Relation Age of Onset  . Heart attack Mother   . Diabetes Mother   . Hypertension Mother   . Sick sinus syndrome Brother     Pacemaker  . Congenital heart disease Brother     CABG  . Cancer Maternal Aunt     breast, liver,lung  . Cancer Cousin     leukemia  . Stroke Brother   . Coronary artery disease Brother    History  Substance Use Topics  . Smoking status: Former Smoker -- 1 years    Types: Cigarettes  . Smokeless  tobacco: Never Used  . Alcohol Use: No   OB History   Grav Para Term Preterm Abortions TAB SAB Ect Mult Living   2 2             Review of Systems  Constitutional: Negative for fever and chills.  Respiratory: Negative for shortness of breath and wheezing.   Skin: Positive for wound.  Neurological: Negative for numbness.      Allergies  Aspirin and Codeine  Home Medications   Prior to Admission medications   Medication Sig Start Date End Date Taking? Authorizing Provider  albuterol (PROVENTIL HFA;VENTOLIN HFA) 108 (90 BASE) MCG/ACT inhaler Inhale 2 puffs into the lungs every 4 (four) hours as needed. Shortness of Breath     Historical Provider, MD  atorvastatin (LIPITOR) 10 MG tablet Take 10 mg by mouth at bedtime.    Historical Provider, MD  glimepiride (AMARYL) 2 MG tablet Take 1 mg by mouth daily.     Historical Provider, MD  imipramine (TOFRANIL) 50 MG tablet Take 1 tablet (50 mg total) by mouth at bedtime. 11/19/13   Adline Potter, NP  metoprolol (LOPRESSOR) 50 MG tablet Take 50 mg by mouth 2 (two) times daily.      Historical Provider, MD  mirabegron  ER (MYRBETRIQ) 25 MG TB24 Take 1 tablet (25 mg total) by mouth daily. 05/21/13   Adline Potter, NP  nystatin (MYCOSTATIN/NYSTOP) 100000 UNIT/GM POWD Apply 1 g topically 3 (three) times daily. 11/19/13   Adline Potter, NP  omeprazole (PRILOSEC) 20 MG capsule  03/02/13   Historical Provider, MD  predniSONE (DELTASONE) 10 MG tablet Take 2 tablets (20 mg total) by mouth 2 (two) times daily. 04/25/13   Hope Orlene Och, NP  TOVIAZ 8 MG TB24 tablet TAKE ONE TABLET BY MOUTH ONCE DAILY. 03/09/14   Adline Potter, NP   BP 120/73  Pulse 68  Temp(Src) 97.5 F (36.4 C) (Oral)  Resp 16  SpO2 97% Physical Exam  Constitutional: She is oriented to person, place, and time. She appears well-developed and well-nourished.  HENT:  Head: Normocephalic.  Cardiovascular: Normal rate.   Pulmonary/Chest: Effort normal.  Musculoskeletal:  She exhibits no tenderness.  Neurological: She is alert and oriented to person, place, and time. No sensory deficit.  Skin: Laceration noted.  1 cm laceration volar distal finger tuft,  Subc,  Hemostatic, right index finger.    ED Course  Procedures (including critical care time)   LACERATION REPAIR Performed by: Burgess Amor Authorized by: Burgess Amor Consent: Verbal consent obtained. Risks and benefits: risks, benefits and alternatives were discussed Consent given by: patient Patient identity confirmed: provided demographic data Prepped and Draped in normal sterile fashion Wound explored  Laceration Location: right index finger  Laceration Length: 1cm  No Foreign Bodies seen or palpated  Anesthesia: digital block  Local anesthetic: lidocaine 2% without epinephrine  Anesthetic total: 2 ml  Irrigation method: syringe Amount of cleaning: standard  Skin closure: ethilon 4-0  Number of sutures: 3  Technique: simple interrupted  Patient tolerance: Patient tolerated the procedure well with no immediate complications.   Labs Review Labs Reviewed  CBG MONITORING, ED    Imaging Review No results found.   EKG Interpretation None      MDM   Final diagnoses:  Laceration    Wound care instructions given.  Pt advised to have sutures removed in 10  days,  Return here sooner for any signs of infection including redness, swelling, worse pain or drainage of pus.       Burgess Amor, PA-C 04/11/14 1736

## 2014-04-11 NOTE — ED Notes (Signed)
Sliced finger on kitchen utensil  appox 1 hr ago. bandaid intact.

## 2014-04-11 NOTE — Discharge Instructions (Signed)
Laceration Care, Adult °A laceration is a cut that goes through all layers of the skin. The cut goes into the tissue beneath the skin. °HOME CARE °For stitches (sutures) or staples: °· Keep the cut clean and dry. °· If you have a bandage (dressing), change it at least once a day. Change the bandage if it gets wet or dirty, or as told by your doctor. °· Wash the cut with soap and water 2 times a day. Rinse the cut with water. Pat it dry with a clean towel. °· Put a thin layer of medicated cream on the cut as told by your doctor. °· You may shower after the first 24 hours. Do not soak the cut in water until the stitches are removed. °· Only take medicines as told by your doctor. °· Have your stitches or staples removed as told by your doctor. °For skin adhesive strips: °· Keep the cut clean and dry. °· Do not get the strips wet. You may take a bath, but be careful to keep the cut dry. °· If the cut gets wet, pat it dry with a clean towel. °· The strips will fall off on their own. Do not remove the strips that are still stuck to the cut. °For wound glue: °· You may shower or take baths. Do not soak or scrub the cut. Do not swim. Avoid heavy sweating until the glue falls off on its own. After a shower or bath, pat the cut dry with a clean towel. °· Do not put medicine on your cut until the glue falls off. °· If you have a bandage, do not put tape over the glue. °· Avoid lots of sunlight or tanning lamps until the glue falls off. Put sunscreen on the cut for the first year to reduce your scar. °· The glue will fall off on its own. Do not pick at the glue. °You may need a tetanus shot if: °· You cannot remember when you had your last tetanus shot. °· You have never had a tetanus shot. °If you need a tetanus shot and you choose not to have one, you may get tetanus. Sickness from tetanus can be serious. °GET HELP RIGHT AWAY IF:  °· Your pain does not get better with medicine. °· Your arm, hand, leg, or foot loses feeling  (numbness) or changes color. °· Your cut is bleeding. °· Your joint feels weak, or you cannot use your joint. °· You have painful lumps on your body. °· Your cut is red, puffy (swollen), or painful. °· You have a red line on the skin near the cut. °· You have yellowish-white fluid (pus) coming from the cut. °· You have a fever. °· You have a bad smell coming from the cut or bandage. °· Your cut breaks open before or after stitches are removed. °· You notice something coming out of the cut, such as wood or glass. °· You cannot move a finger or toe. °MAKE SURE YOU:  °· Understand these instructions. °· Will watch your condition. °· Will get help right away if you are not doing well or get worse. °Document Released: 05/17/2008 Document Revised: 02/21/2012 Document Reviewed: 05/25/2011 °ExitCare® Patient Information ©2014 [employer]. ° ° ° ° °

## 2014-04-11 NOTE — ED Notes (Signed)
Suturing equipment at bedside.

## 2014-04-11 NOTE — ED Provider Notes (Signed)
Medical screening examination/treatment/procedure(s) were performed by non-physician practitioner and as supervising physician I was immediately available for consultation/collaboration.   EKG Interpretation None      Devoria Albe, MD, Armando Gang   Ward Givens, MD 04/11/14 [phone]

## 2014-04-11 NOTE — ED Notes (Signed)
Patient with no complaints at this time. Respirations even and unlabored. Skin warm/dry. Discharge instructions reviewed with patient at this time. Patient given opportunity to voice concerns/ask questions. Patient discharged at this time and left Emergency Department with steady gait.   

## 2014-06-13 ENCOUNTER — Ambulatory Visit (INDEPENDENT_AMBULATORY_CARE_PROVIDER_SITE_OTHER): Payer: Commercial Managed Care - HMO | Admitting: Podiatrist

## 2014-06-13 ENCOUNTER — Encounter: Payer: Self-pay | Admitting: Podiatrist

## 2014-06-13 VITALS — BP 131/77 | HR 75 | Resp 18

## 2014-06-13 DIAGNOSIS — E1149 Type 2 diabetes mellitus with other diabetic neurological complication: Secondary | ICD-10-CM

## 2014-06-13 DIAGNOSIS — E1142 Type 2 diabetes mellitus with diabetic polyneuropathy: Secondary | ICD-10-CM

## 2014-06-13 DIAGNOSIS — Q828 Other specified congenital malformations of skin: Secondary | ICD-10-CM

## 2014-06-13 DIAGNOSIS — M216X9 Other acquired deformities of unspecified foot: Secondary | ICD-10-CM

## 2014-06-13 DIAGNOSIS — E1140 Type 2 diabetes mellitus with diabetic neuropathy, unspecified: Secondary | ICD-10-CM

## 2014-06-13 NOTE — Patient Instructions (Signed)
Diabetes and Foot Care Diabetes may cause you to have problems because of poor blood supply (circulation) to your feet and legs. This may cause the skin on your feet to become thinner, break easier, and heal more slowly. Your skin may become dry, and the skin may peel and crack. You may also have nerve damage in your legs and feet causing decreased feeling in them. You may not notice minor injuries to your feet that could lead to infections or more serious problems. Taking care of your feet is one of the most important things you can do for yourself.  HOME CARE INSTRUCTIONS  Wear shoes at all times, even in the house. Do not go barefoot. Bare feet are easily injured.  Check your feet daily for blisters, cuts, and redness. If you cannot see the bottom of your feet, use a mirror or ask someone for help.  Wash your feet with warm water (do not use hot water) and mild soap. Then pat your feet and the areas between your toes until they are completely dry. Do not soak your feet as this can dry your skin.  Apply a moisturizing lotion or petroleum jelly (that does not contain alcohol and is unscented) to the skin on your feet and to dry, brittle toenails. Do not apply lotion between your toes.  Trim your toenails straight across. Do not dig under them or around the cuticle. File the edges of your nails with an emery board or nail file.  Do not cut corns or calluses or try to remove them with medicine.  Wear clean socks or stockings every day. Make sure they are not too tight. Do not wear knee-high stockings since they may decrease blood flow to your legs.  Wear shoes that fit properly and have enough cushioning. To break in new shoes, wear them for just a few hours a day. This prevents you from injuring your feet. Always look in your shoes before you put them on to be sure there are no objects inside.  Do not cross your legs. This may decrease the blood flow to your feet.  If you find a minor scrape,  cut, or break in the skin on your feet, keep it and the skin around it clean and dry. These areas may be cleansed with mild soap and water. Do not cleanse the area with peroxide, alcohol, or iodine.  When you remove an adhesive bandage, be sure not to damage the skin around it.  If you have a wound, look at it several times a day to make sure it is healing.  Do not use heating pads or hot water bottles. They may burn your skin. If you have lost feeling in your feet or legs, you may not know it is happening until it is too late.  Make sure your health care provider performs a complete foot exam at least annually or more often if you have foot problems. Report any cuts, sores, or bruises to your health care provider immediately. SEEK MEDICAL CARE IF:   You have an injury that is not healing.  You have cuts or breaks in the skin.  You have an ingrown nail.  You notice redness on your legs or feet.  You feel burning or tingling in your legs or feet.  You have pain or cramps in your legs and feet.  Your legs or feet are numb.  Your feet always feel cold. SEEK IMMEDIATE MEDICAL CARE IF:   There is increasing redness,   swelling, or pain in or around a wound.  There is a red line that goes up your leg.  Pus is coming from a wound.  You develop a fever or as directed by your health care provider.  You notice a bad smell coming from an ulcer or wound. Document Released: 11/26/2000 Document Revised: 08/01/2013 Document Reviewed: 05/08/2013 ExitCare Patient Information 2015 [employer]. This information is not intended to replace advice given to you by your health care provider. Make sure you discuss any questions you have with your health care provider.  

## 2014-06-13 NOTE — Progress Notes (Signed)
   Subjective:    Patient ID: [name], female    DOB: [birth year], 58 y.o.   MRN: [id number]  HPI I HAVE SOME CALLUSES ON BALLS OF BOTH FEET AND THEY HAVE BEEN GOING ON FOR ABOUT 6 MONTHS WHERE THEY ARE SORE AND TENDER AND DO BURN SOME AND THROBS AT NIGHT AND CRAMPING AND THEY DO GET NUMB AND TINGLE AND MY FEET GET COLD    Review of Systems  Musculoskeletal: Positive for back pain and gait problem.       Joint pain  All other systems reviewed and are negative.      Objective:   Physical Exam Patient is diabetic. She is awake, alert, and oriented x 3.  In no acute distress.  Vascular status is intact with palpable pedal pulses at 2/4 DP and PT bilateral and capillary refill time within normal limits. Neurological sensation is also intact bilaterally via Semmes Weinstein monofilament at 5/5 sites. Light touch, vibratory sensation, Achilles tendon reflex is intact. Dermatological exam reveals minor calluses submetatarsal one through 5 bilateral feet. Intact integument is present. No open lesions present.  Musculature intact with dorsiflexion, plantarflexion, inversion, eversion. Mild Forefoot cavus deformity is present. Splaying of digits 2 and 3 of the right foot is also noted.        Assessment & Plan:  Prominent metatarsal heads, calluses bilateral feet  Debridement of calluses was carried out today without complication. She'll be seen back as needed for followup. Recommended against the wearing her flip-flops however she states the only thing she can wear. She will likely continue to form the calluses if she continues to choose the type of footwear.

## 2014-06-17 ENCOUNTER — Other Ambulatory Visit: Payer: Self-pay | Admitting: Adult Health

## 2014-07-21 ENCOUNTER — Emergency Department (HOSPITAL_COMMUNITY): Payer: Medicare HMO

## 2014-07-21 ENCOUNTER — Inpatient Hospital Stay (HOSPITAL_COMMUNITY)
Admission: EM | Admit: 2014-07-21 | Discharge: 2014-07-23 | DRG: 101 | Disposition: A | Discharge status: Home / Self Care | Payer: Medicare HMO | Attending: Internal Medicine | Admitting: Internal Medicine

## 2014-07-21 ENCOUNTER — Encounter (HOSPITAL_COMMUNITY): Payer: Self-pay | Admitting: *Deleted

## 2014-07-21 DIAGNOSIS — R262 Difficulty in walking, not elsewhere classified: Secondary | ICD-10-CM

## 2014-07-21 DIAGNOSIS — Z91199 Patient's noncompliance with other medical treatment and regimen due to unspecified reason: Secondary | ICD-10-CM

## 2014-07-21 DIAGNOSIS — Z8249 Family history of ischemic heart disease and other diseases of the circulatory system: Secondary | ICD-10-CM

## 2014-07-21 DIAGNOSIS — Z6841 Body Mass Index (BMI) 40.0 and over, adult: Secondary | ICD-10-CM | POA: Diagnosis not present

## 2014-07-21 DIAGNOSIS — E119 Type 2 diabetes mellitus without complications: Secondary | ICD-10-CM | POA: Diagnosis present

## 2014-07-21 DIAGNOSIS — J45909 Unspecified asthma, uncomplicated: Secondary | ICD-10-CM | POA: Diagnosis present

## 2014-07-21 DIAGNOSIS — Z96659 Presence of unspecified artificial knee joint: Secondary | ICD-10-CM | POA: Diagnosis not present

## 2014-07-21 DIAGNOSIS — Z823 Family history of stroke: Secondary | ICD-10-CM | POA: Diagnosis not present

## 2014-07-21 DIAGNOSIS — F40298 Other specified phobia: Secondary | ICD-10-CM | POA: Diagnosis present

## 2014-07-21 DIAGNOSIS — E669 Obesity, unspecified: Secondary | ICD-10-CM

## 2014-07-21 DIAGNOSIS — Z833 Family history of diabetes mellitus: Secondary | ICD-10-CM

## 2014-07-21 DIAGNOSIS — Z23 Encounter for immunization: Secondary | ICD-10-CM

## 2014-07-21 DIAGNOSIS — G40909 Epilepsy, unspecified, not intractable, without status epilepticus: Secondary | ICD-10-CM | POA: Diagnosis present

## 2014-07-21 DIAGNOSIS — R569 Unspecified convulsions: Secondary | ICD-10-CM | POA: Diagnosis present

## 2014-07-21 DIAGNOSIS — E7800 Pure hypercholesterolemia, unspecified: Secondary | ICD-10-CM | POA: Diagnosis present

## 2014-07-21 DIAGNOSIS — Z806 Family history of leukemia: Secondary | ICD-10-CM

## 2014-07-21 DIAGNOSIS — IMO0001 Reserved for inherently not codable concepts without codable children: Secondary | ICD-10-CM | POA: Diagnosis present

## 2014-07-21 DIAGNOSIS — Z9119 Patient's noncompliance with other medical treatment and regimen: Secondary | ICD-10-CM | POA: Diagnosis not present

## 2014-07-21 DIAGNOSIS — Z8673 Personal history of transient ischemic attack (TIA), and cerebral infarction without residual deficits: Secondary | ICD-10-CM

## 2014-07-21 DIAGNOSIS — M86679 Other chronic osteomyelitis, unspecified ankle and foot: Secondary | ICD-10-CM

## 2014-07-21 DIAGNOSIS — I10 Essential (primary) hypertension: Secondary | ICD-10-CM | POA: Diagnosis present

## 2014-07-21 DIAGNOSIS — G4733 Obstructive sleep apnea (adult) (pediatric): Secondary | ICD-10-CM | POA: Diagnosis present

## 2014-07-21 DIAGNOSIS — F410 Panic disorder [episodic paroxysmal anxiety] without agoraphobia: Secondary | ICD-10-CM | POA: Diagnosis present

## 2014-07-21 DIAGNOSIS — E1169 Type 2 diabetes mellitus with other specified complication: Secondary | ICD-10-CM

## 2014-07-21 DIAGNOSIS — Z87891 Personal history of nicotine dependence: Secondary | ICD-10-CM | POA: Diagnosis not present

## 2014-07-21 DIAGNOSIS — R29898 Other symptoms and signs involving the musculoskeletal system: Secondary | ICD-10-CM

## 2014-07-21 LAB — PROTIME-INR
INR: 1.06 (ref 0.00–1.49)
PROTHROMBIN TIME: 13.8 s (ref 11.6–15.2)

## 2014-07-21 LAB — COMPREHENSIVE METABOLIC PANEL
ALBUMIN: 3.9 g/dL (ref 3.5–5.2)
ALT: 77 U/L — ABNORMAL HIGH (ref 0–35)
AST: 82 U/L — AB (ref 0–37)
Alkaline Phosphatase: 95 U/L (ref 39–117)
Anion gap: 10 (ref 5–15)
BILIRUBIN TOTAL: 0.6 mg/dL (ref 0.3–1.2)
BUN: 13 mg/dL (ref 6–23)
CHLORIDE: 104 meq/L (ref 96–112)
CO2: 26 mEq/L (ref 19–32)
CREATININE: 0.84 mg/dL (ref 0.50–1.10)
Calcium: 8.9 mg/dL (ref 8.4–10.5)
GFR calc Af Amer: 87 mL/min — ABNORMAL LOW (ref >90)
GFR calc non Af Amer: 75 mL/min — ABNORMAL LOW (ref >90)
Glucose, Bld: 149 mg/dL — ABNORMAL HIGH (ref 70–99)
POTASSIUM: 4.6 meq/L (ref 3.7–5.3)
Sodium: 140 mEq/L (ref 137–147)
Total Protein: 7 g/dL (ref 6.0–8.3)

## 2014-07-21 LAB — URINALYSIS, ROUTINE W REFLEX MICROSCOPIC
Bilirubin Urine: NEGATIVE
Glucose, UA: NEGATIVE mg/dL
HGB URINE DIPSTICK: NEGATIVE
KETONES UR: NEGATIVE mg/dL
Leukocytes, UA: NEGATIVE
Nitrite: NEGATIVE
PROTEIN: NEGATIVE mg/dL
Specific Gravity, Urine: 1.015 (ref 1.005–1.030)
UROBILINOGEN UA: 0.2 mg/dL (ref 0.0–1.0)
pH: 7.5 (ref 5.0–8.0)

## 2014-07-21 LAB — DIFFERENTIAL
BASOS ABS: 0.1 10*3/uL (ref 0.0–0.1)
BASOS PCT: 2 % — AB (ref 0–1)
Eosinophils Absolute: 0.1 10*3/uL (ref 0.0–0.7)
Eosinophils Relative: 3 % (ref 0–5)
Lymphocytes Relative: 40 % (ref 12–46)
Lymphs Abs: 1.5 10*3/uL (ref 0.7–4.0)
MONO ABS: 0.4 10*3/uL (ref 0.1–1.0)
Monocytes Relative: 10 % (ref 3–12)
NEUTROS ABS: 1.8 10*3/uL (ref 1.7–7.7)
Neutrophils Relative %: 45 % (ref 43–77)

## 2014-07-21 LAB — GLUCOSE, CAPILLARY
GLUCOSE-CAPILLARY: 137 mg/dL — AB (ref 70–99)
Glucose-Capillary: 115 mg/dL — ABNORMAL HIGH (ref 70–99)

## 2014-07-21 LAB — RAPID URINE DRUG SCREEN, HOSP PERFORMED
AMPHETAMINES: NOT DETECTED
Barbiturates: NOT DETECTED
Benzodiazepines: NOT DETECTED
COCAINE: NOT DETECTED
Opiates: NOT DETECTED
TETRAHYDROCANNABINOL: NOT DETECTED

## 2014-07-21 LAB — CBC
HCT: 42.1 % (ref 36.0–46.0)
Hemoglobin: 14 g/dL (ref 12.0–15.0)
MCH: 30 pg (ref 26.0–34.0)
MCHC: 33.3 g/dL (ref 30.0–36.0)
MCV: 90.3 fL (ref 78.0–100.0)
PLATELETS: 168 10*3/uL (ref 150–400)
RBC: 4.66 MIL/uL (ref 3.87–5.11)
RDW: 12.8 % (ref 11.5–15.5)
WBC: 3.9 10*3/uL — ABNORMAL LOW (ref 4.0–10.5)

## 2014-07-21 LAB — ETHANOL: Alcohol, Ethyl (B): <11 mg/dL (ref 0–11)

## 2014-07-21 LAB — APTT: aPTT: 29 seconds (ref 24–37)

## 2014-07-21 LAB — TROPONIN I: Troponin I: <0.3 ng/mL (ref <0.30)

## 2014-07-21 MED ORDER — INSULIN ASPART 100 UNIT/ML ~~LOC~~ SOLN
0.0000 [IU] | SUBCUTANEOUS | Status: DC
  Administered 2014-07-21 – 2014-07-22 (×2): 3 [IU] via SUBCUTANEOUS

## 2014-07-21 MED ORDER — ALBUTEROL SULFATE (2.5 MG/3ML) 0.083% IN NEBU: 2.5000 mg | INHALATION_SOLUTION | RESPIRATORY_TRACT | Status: DC | PRN

## 2014-07-21 MED ORDER — ONDANSETRON HCL 4 MG/2ML IJ SOLN
4.0000 mg | Freq: Once | INTRAMUSCULAR | Status: AC
  Administered 2014-07-21: 4 mg via INTRAVENOUS
  Filled 2014-07-21: qty 2

## 2014-07-21 MED ORDER — LORAZEPAM 2 MG/ML IJ SOLN
1.0000 mg | Freq: Once | INTRAMUSCULAR | Status: AC
  Administered 2014-07-21: 1 mg via INTRAVENOUS

## 2014-07-21 MED ORDER — ESCITALOPRAM OXALATE 10 MG PO TABS
20.0000 mg | ORAL_TABLET | Freq: Every day | ORAL | Status: DC
  Administered 2014-07-21 – 2014-07-23 (×3): 20 mg via ORAL
  Filled 2014-07-21 (×3): qty 2

## 2014-07-21 MED ORDER — ONDANSETRON HCL 4 MG/2ML IJ SOLN: 4.0000 mg | Freq: Four times a day (QID) | INTRAMUSCULAR | Status: DC | PRN

## 2014-07-21 MED ORDER — HEPARIN SODIUM (PORCINE) 5000 UNIT/ML IJ SOLN
5000.0000 [IU] | Freq: Three times a day (TID) | INTRAMUSCULAR | Status: DC
  Administered 2014-07-21 – 2014-07-23 (×6): 5000 [IU] via SUBCUTANEOUS
  Filled 2014-07-21 (×7): qty 1

## 2014-07-21 MED ORDER — ATORVASTATIN CALCIUM 10 MG PO TABS
10.0000 mg | ORAL_TABLET | Freq: Every day | ORAL | Status: DC
  Administered 2014-07-21 – 2014-07-22 (×2): 10 mg via ORAL
  Filled 2014-07-21 (×2): qty 1

## 2014-07-21 MED ORDER — LEVETIRACETAM IN NACL 500 MG/100ML IV SOLN
500.0000 mg | Freq: Two times a day (BID) | INTRAVENOUS | Status: DC
  Administered 2014-07-21 – 2014-07-22 (×2): 500 mg via INTRAVENOUS
  Filled 2014-07-21 (×5): qty 100

## 2014-07-21 MED ORDER — ONDANSETRON HCL 4 MG PO TABS: 4.0000 mg | ORAL_TABLET | Freq: Four times a day (QID) | ORAL | Status: DC | PRN

## 2014-07-21 MED ORDER — ACETAMINOPHEN 325 MG PO TABS
650.0000 mg | ORAL_TABLET | Freq: Four times a day (QID) | ORAL | Status: DC | PRN
  Administered 2014-07-22 – 2014-07-23 (×2): 650 mg via ORAL
  Filled 2014-07-21 (×2): qty 2

## 2014-07-21 MED ORDER — ACETAMINOPHEN 650 MG RE SUPP
650.0000 mg | Freq: Four times a day (QID) | RECTAL | Status: DC | PRN
Start: 2014-07-21 — End: 2014-07-23

## 2014-07-21 MED ORDER — METOPROLOL TARTRATE 50 MG PO TABS
50.0000 mg | ORAL_TABLET | Freq: Two times a day (BID) | ORAL | Status: DC
  Administered 2014-07-21 – 2014-07-23 (×5): 50 mg via ORAL
  Filled 2014-07-21 (×5): qty 1

## 2014-07-21 MED ORDER — LEVETIRACETAM IN NACL 1500 MG/100ML IV SOLN
1500.0000 mg | Freq: Once | INTRAVENOUS | Status: AC
  Administered 2014-07-21 (×2): 1500 mg via INTRAVENOUS
  Filled 2014-07-21: qty 100

## 2014-07-21 MED ORDER — MORPHINE SULFATE 4 MG/ML IJ SOLN
4.0000 mg | Freq: Once | INTRAMUSCULAR | Status: AC
  Administered 2014-07-21: 4 mg via INTRAVENOUS
  Filled 2014-07-21: qty 1

## 2014-07-21 MED ORDER — PNEUMOCOCCAL VAC POLYVALENT 25 MCG/0.5ML IJ INJ
0.5000 mL | INJECTION | INTRAMUSCULAR | Status: AC
  Administered 2014-07-22: 0.5 mL via INTRAMUSCULAR
  Filled 2014-07-21: qty 0.5

## 2014-07-21 MED ORDER — ASPIRIN 325 MG PO TABS
325.0000 mg | ORAL_TABLET | Freq: Every day | ORAL | Status: DC
  Administered 2014-07-21 – 2014-07-23 (×3): 325 mg via ORAL
  Filled 2014-07-21 (×3): qty 1

## 2014-07-21 MED ORDER — LORAZEPAM 2 MG/ML IJ SOLN
INTRAMUSCULAR | Status: AC
  Filled 2014-07-21: qty 1

## 2014-07-21 MED ORDER — LEVOTHYROXINE SODIUM 50 MCG PO TABS
50.0000 ug | ORAL_TABLET | Freq: Every day | ORAL | Status: DC
  Administered 2014-07-22 – 2014-07-23 (×2): 50 ug via ORAL
  Filled 2014-07-21 (×3): qty 1

## 2014-07-21 MED ORDER — SODIUM CHLORIDE 0.9 % IV SOLN: INTRAVENOUS | Status: AC

## 2014-07-21 MED ORDER — LORAZEPAM 2 MG/ML IJ SOLN: 2.0000 mg | INTRAMUSCULAR | Status: DC | PRN

## 2014-07-21 NOTE — H&P (Signed)
[facility] History and Physical  AURORE REDINGER ZHG:[id number] DOB: 30-Jun-1956 DOA: 07/21/2014  Referring physician: Emergency Department PCP: [name], MD  Specialists:   Chief Complaint: Unresponsiveness  HPI: [name] is a 58 y.o. female  With a hx of DM, HTN, OSA noncompliant to cpap, who presents to ED after change of consciousness that happened at around 930am on day of admit. Pt's husband reports sudden frothing at the mouth followed by L sided weakness and difficulty ambulating. Shortly afterwards, pt noted to have heavy breathing and shaking/rigors of the B arms and head. Pt became less responsive afterwards en route to ED. In the ED, CT head was unremarkable. Tele-neuro was contacted with recs for benzos. Pt's mentation improved with ativan and pt was started on keppra per Neurology recs. Hospitalist consulted for admission.  Review of Systems: Per above, remainder of the 10pt ros reviewed and are neg  Past Medical History  Diagnosis Date  . Diabetes mellitus   . Hypertension   . Asthma   . Overactive bladder   . High cholesterol   . Fibromyalgia   . Panic attacks   . Obstructive sleep apnea     Noncompliant with CPAP due to claustophobia.  . H/O cardiac catheterization     No significant CAD (only 20% LAD) 03/02/13 with normal LV function.  . Rectocele 05/21/2013  . OAB (overactive bladder) 05/21/2013   Past Surgical History  Procedure Laterality Date  . Abdominal hysterectomy    . Knee replacements      bilateral  . Back surgery    . Tonsillectomy    . Carpal tunnel release    . Ankle surgery     Social History:  reports that she has quit smoking. Her smoking use included Cigarettes. She smoked 0.00 packs per day for 1 year. She has never used smokeless tobacco. She reports that she does not drink alcohol or use illicit drugs.  where does patient live--home, ALF, SNF? and with whom if at home?  Can patient participate in ADLs?  Allergies  Allergen  Reactions  . Aspirin Nausea And Vomiting  . Codeine Rash    Family History  Problem Relation Age of Onset  . Heart attack Mother   . Diabetes Mother   . Hypertension Mother   . Sick sinus syndrome Brother     Pacemaker  . Congenital heart disease Brother     CABG  . Cancer Maternal Aunt     breast, liver,lung  . Cancer Cousin     leukemia  . Stroke Brother   . Coronary artery disease Brother     (be sure to complete)  Prior to Admission medications   Medication Sig Start Date End Date Taking? Authorizing Provider  albuterol (PROVENTIL HFA;VENTOLIN HFA) 108 (90 BASE) MCG/ACT inhaler Inhale 2 puffs into the lungs every 4 (four) hours as needed. Shortness of Breath    Yes Historical Provider, MD  atorvastatin (LIPITOR) 10 MG tablet Take 10 mg by mouth at bedtime.   Yes Historical Provider, MD  escitalopram (LEXAPRO) 20 MG tablet Take 20 mg by mouth daily.  03/09/14  Yes Historical Provider, MD  glimepiride (AMARYL) 2 MG tablet Take 1 mg by mouth daily.    Yes Historical Provider, MD  imipramine (TOFRANIL) 50 MG tablet TAKE (1) TABLET BY MOUTH AT BEDTIME. 06/17/14  Yes Adline Potter, NP  levothyroxine (SYNTHROID, LEVOTHROID) 50 MCG tablet Take 50 mcg by mouth daily before breakfast.  03/09/14  Yes Historical  Provider, MD  metFORMIN (GLUCOPHAGE) 500 MG tablet Take 500 mg by mouth 2 (two) times daily with a meal.  03/26/14  Yes Historical Provider, MD  metoprolol (LOPRESSOR) 50 MG tablet Take 50 mg by mouth 2 (two) times daily.     Yes Historical Provider, MD  mirabegron ER (MYRBETRIQ) 25 MG TB24 Take 1 tablet (25 mg total) by mouth daily. 05/21/13  Yes Adline Potter, NP  nystatin (MYCOSTATIN/NYSTOP) 100000 UNIT/GM POWD Apply 1 g topically 3 (three) times daily. 11/19/13  Yes Adline Potter, NP  PRESCRIPTION MEDICATION Place 2 drops into both eyes 2 (two) times daily. For dry eyes.   Yes Historical Provider, MD  TOVIAZ 8 MG TB24 tablet TAKE ONE TABLET BY MOUTH ONCE DAILY. 03/09/14   Yes Adline Potter, NP   Physical Exam: Filed Vitals:   07/21/14 1320 07/21/14 1330 07/21/14 1340 07/21/14 1356  BP: 134/85 140/94 139/82 119/62  Pulse: 87 83 77 81  Temp:    98 F (36.7 C)  TempSrc:    Oral  Resp: 15 14 19    Height:    5\' 7"  (1.702 m)  Weight:    135.172 kg (298 lb)  SpO2: 91% 85% 88% 92%     General:  Awake, appears lethargic, slow to answer questions  Eyes: PERRL B  ENT: membranes moist, dentition fair  Neck: trachea midline, neck supple  Cardiovascular: regular, s1, s2  Respiratory: normal resp effort, no wheezing  Abdomen: obese, soft,nondistended  Skin: normal skin turgor, no abnormal skin lesions seen  Musculoskeletal: perfused, no clubbing  Psychiatric: mood/affect normal // no auditory/visual hallucinations  Neurologic: cn2-12 grossly intact, sensation intact throughout, L arm with 4/5 strength, 5/5/ elsewhere  Labs on Admission:  Basic Metabolic Panel:  Recent Labs Lab 07/21/14 1114  NA 140  K 4.6  CL 104  CO2 26  GLUCOSE 149*  BUN 13  CREATININE 0.84  CALCIUM 8.9   Liver Function Tests:  Recent Labs Lab 07/21/14 1114  AST 82*  ALT 77*  ALKPHOS 95  BILITOT 0.6  PROT 7.0  ALBUMIN 3.9   No results found for this basename: LIPASE, AMYLASE,  in the last 168 hours No results found for this basename: AMMONIA,  in the last 168 hours CBC:  Recent Labs Lab 07/21/14 1114  WBC 3.9*  NEUTROABS 1.8  HGB 14.0  HCT 42.1  MCV 90.3  PLT 168   Cardiac Enzymes:  Recent Labs Lab 07/21/14 1114  TROPONINI <0.30    BNP (last 3 results) No results found for this basename: PROBNP,  in the last 8760 hours CBG: No results found for this basename: GLUCAP,  in the last 168 hours  Radiological Exams on Admission: Ct Head Wo Contrast  07/21/2014   CLINICAL DATA:  58 year old female with sudden onset altered mental status. Code stroke.  EXAM: CT HEAD WITHOUT CONTRAST  TECHNIQUE: Contiguous axial images were obtained from the  base of the skull through the vertex without intravenous contrast.  COMPARISON:  02/02/2013 and prior CTs  FINDINGS: Mild generalized cerebral volume loss noted.  No acute intracranial abnormalities are identified, including mass lesion or mass effect, hydrocephalus, extra-axial fluid collection, midline shift, hemorrhage, or acute infarction.  The visualized bony calvarium is unremarkable.  IMPRESSION: No evidence of acute intracranial abnormality.  Critical Value/emergent results were called by telephone at the time of interpretation on 07/21/2014 at 11:12 am to Dr. Donnetta Hutching , who verbally acknowledged these results.   Electronically Signed  By: Laveda Abbe M.D.   On: 07/21/2014 11:12    EKG: Independently reviewed. NSR  Assessment/Plan Principal Problem:   Seizure Active Problems:   HIGH BLOOD PRESSURE   Diabetes   Morbid obesity  1. Possible seizures 1. Will cont on keppra 500mg  q12hrs 2. PRN ativan for seizures 3. Will consult Neurology for further recs 4. Admit to med-surg 5. MRI brain pending 2. HTN 1. BP stable 2. Cont home meds 3. DM 1. Will hold oral DM meds while inpatient 2. Cont on SSI coverage 3. Will check a1c 4. L arm weakness 1. Initial head CT unremarkable 2. MRI brain pending 3. Will check 2d echo and carotid dopplers 4. Will check a1c and lipids per above 5. DVT prophylaxis 1. Heparin subq  Code Status: Full (must indicate code status--if unknown or must be presumed, indicate so) Family Communication: Pt and husband at bedside (indicate person spoken with, if applicable, with phone number if by telephone) Disposition Plan: Pending (indicate anticipated LOS)  Time spent:  CHIU, STEPHEN K [facility] Pager [phone]  If 7PM-7AM, please contact night-coverage [email or web address] Password [email or web address] 07/21/2014, 2:30 PM

## 2014-07-21 NOTE — ED Notes (Signed)
Last known well time was last night.  This morning awakened with flank pain.  Around 10 pt went unresponsive.  Is moving head and moving right arm uncontrollably.  ? Seizure activity.  cbg 153,  153/122,  Rate 100, respirations 48,  sats 97% ra.

## 2014-07-21 NOTE — Progress Notes (Signed)
Pt passed swallow screen in the ED so diet advanced as per care order

## 2014-07-21 NOTE — ED Notes (Addendum)
Pt is now responding to voice and making eye contact with nurse.

## 2014-07-21 NOTE — Progress Notes (Signed)
Utilization review Completed Lyah Millirons RN BSN   

## 2014-07-21 NOTE — ED Provider Notes (Signed)
CSN: [id number]     Arrival date & time 07/21/14  1051 History  This chart was scribed for Donnetta Hutching, MD by Leone Payor, ED Scribe. This patient was seen in room APA02/APA02 and the patient's care was started 11:00 AM.    Chief Complaint  Patient presents with  . Code Stroke    The history is provided by the EMS personnel. The history is limited by the condition of the patient. No language interpreter was used.   LEVEL 5 CAVEAT-UNRESPONSIVE HPI Comments: [name] is a 58 y.o. female with past medical history of DM, HTN, cardiac catheterization brought in by ambulance, who presents to the Emergency Department complaining of unresponsiveness that began 1 hour ago. Per EMS, patient was last at baseline 1 hour ago when she walked and sat in a chair. Soon afterwards, patient began jerking her right arm and involuntarily shaking her head. Per EMS, patient was not verbal and would not respond to commands.   Past Medical History  Diagnosis Date  . Diabetes mellitus   . Hypertension   . Asthma   . Overactive bladder   . High cholesterol   . Fibromyalgia   . Panic attacks   . Obstructive sleep apnea     Noncompliant with CPAP due to claustophobia.  . H/O cardiac catheterization     No significant CAD (only 20% LAD) 03/02/13 with normal LV function.  . Rectocele 05/21/2013  . OAB (overactive bladder) 05/21/2013   Past Surgical History  Procedure Laterality Date  . Abdominal hysterectomy    . Knee replacements      bilateral  . Back surgery    . Tonsillectomy    . Carpal tunnel release    . Ankle surgery     Family History  Problem Relation Age of Onset  . Heart attack Mother   . Diabetes Mother   . Hypertension Mother   . Sick sinus syndrome Brother     Pacemaker  . Congenital heart disease Brother     CABG  . Cancer Maternal Aunt     breast, liver,lung  . Cancer Cousin     leukemia  . Stroke Brother   . Coronary artery disease Brother    History  Substance Use Topics   . Smoking status: Former Smoker -- 1 years    Types: Cigarettes  . Smokeless tobacco: Never Used  . Alcohol Use: No   OB History   Grav Para Term Preterm Abortions TAB SAB Ect Mult Living   2 2             Review of Systems  Unable to perform ROS: Acuity of condition      Allergies  Aspirin and Codeine  Home Medications   Prior to Admission medications   Medication Sig Start Date End Date Taking? Authorizing Provider  albuterol (PROVENTIL HFA;VENTOLIN HFA) 108 (90 BASE) MCG/ACT inhaler Inhale 2 puffs into the lungs every 4 (four) hours as needed. Shortness of Breath    Yes Historical Provider, MD  atorvastatin (LIPITOR) 10 MG tablet Take 10 mg by mouth at bedtime.   Yes Historical Provider, MD  escitalopram (LEXAPRO) 20 MG tablet Take 20 mg by mouth daily.  03/09/14  Yes Historical Provider, MD  glimepiride (AMARYL) 2 MG tablet Take 1 mg by mouth daily.    Yes Historical Provider, MD  imipramine (TOFRANIL) 50 MG tablet TAKE (1) TABLET BY MOUTH AT BEDTIME. 06/17/14  Yes Adline Potter, NP  levothyroxine (SYNTHROID, LEVOTHROID)  50 MCG tablet Take 50 mcg by mouth daily before breakfast.  03/09/14  Yes Historical Provider, MD  metFORMIN (GLUCOPHAGE) 500 MG tablet Take 500 mg by mouth 2 (two) times daily with a meal.  03/26/14  Yes Historical Provider, MD  metoprolol (LOPRESSOR) 50 MG tablet Take 50 mg by mouth 2 (two) times daily.     Yes Historical Provider, MD  mirabegron ER (MYRBETRIQ) 25 MG TB24 Take 1 tablet (25 mg total) by mouth daily. 05/21/13  Yes Adline Potter, NP  nystatin (MYCOSTATIN/NYSTOP) 100000 UNIT/GM POWD Apply 1 g topically 3 (three) times daily. 11/19/13  Yes Adline Potter, NP  PRESCRIPTION MEDICATION Place 2 drops into both eyes 2 (two) times daily. For dry eyes.   Yes Historical Provider, MD  TOVIAZ 8 MG TB24 tablet TAKE ONE TABLET BY MOUTH ONCE DAILY. 03/09/14  Yes Adline Potter, NP   BP 136/91  Pulse 86  Temp(Src) 97.7 F (36.5 C) (Oral)  Resp  15  SpO2 93% Physical Exam  Nursing note and vitals reviewed. Constitutional:  Morbidly obese. Does not respond to questions or commands  HENT:  Head: Normocephalic and atraumatic.  Neck: Neck supple.  Cardiovascular: Normal rate, regular rhythm and normal heart sounds.   Pulmonary/Chest: Effort normal and breath sounds normal.  Abdominal: Soft.  Musculoskeletal:  Unable to assess  Neurological:  Unable to assess  Skin: Skin is warm and dry.  Psychiatric:  Unable to assess    ED Course  Procedures (including critical care time)  DIAGNOSTIC STUDIES: Oxygen Saturation is 100% on RA, normal by my interpretation.    COORDINATION OF CARE: 11:05 AM Will order head CT, blood work, and UA.   11:20 AM Teleneurology will speak with patient and nursing staff.     Labs Review Labs Reviewed  CBC - Abnormal; Notable for the following:    WBC 3.9 (*)    All other components within normal limits  DIFFERENTIAL - Abnormal; Notable for the following:    Basophils Relative 2 (*)    All other components within normal limits  COMPREHENSIVE METABOLIC PANEL - Abnormal; Notable for the following:    Glucose, Bld 149 (*)    AST 82 (*)    ALT 77 (*)    GFR calc non Af Amer 75 (*)    GFR calc Af Amer 87 (*)    All other components within normal limits  ETHANOL  PROTIME-INR  APTT  URINE RAPID DRUG SCREEN (HOSP PERFORMED)  URINALYSIS, ROUTINE W REFLEX MICROSCOPIC  TROPONIN I  I-STAT CHEM 8, ED    Imaging Review Ct Head Wo Contrast  07/21/2014   CLINICAL DATA:  58 year old female with sudden onset altered mental status. Code stroke.  EXAM: CT HEAD WITHOUT CONTRAST  TECHNIQUE: Contiguous axial images were obtained from the base of the skull through the vertex without intravenous contrast.  COMPARISON:  02/02/2013 and prior CTs  FINDINGS: Mild generalized cerebral volume loss noted.  No acute intracranial abnormalities are identified, including mass lesion or mass effect, hydrocephalus,  extra-axial fluid collection, midline shift, hemorrhage, or acute infarction.  The visualized bony calvarium is unremarkable.  IMPRESSION: No evidence of acute intracranial abnormality.  Critical Value/emergent results were called by telephone at the time of interpretation on 07/21/2014 at 11:12 am to Dr. Donnetta Hutching , who verbally acknowledged these results.   Electronically Signed   By: Laveda Abbe M.D.   On: 07/21/2014 11:12     EKG Interpretation   Date/Time:  Sunday July 21 2014 10:56:22 EDT Ventricular Rate:  82 PR Interval:  159 QRS Duration: 89 QT Interval:  394 QTC Calculation: 460 R Axis:   -12 Text Interpretation:  Sinus rhythm Low voltage, precordial leads Confirmed  by Zared Knoth  MD, Hayla Hinger ([id number]) on 07/21/2014 11:12:59 AM Also confirmed by Adriana Simas   MD, Wilhelmena Zea ([id number])  on 07/21/2014 12:00:03 PM     CRITICAL CARE Performed by: Donnetta Hutching Total critical care time: 45 Critical care time was exclusive of separately billable procedures and treating other patients. Critical care was necessary to treat or prevent imminent or life-threatening deterioration. Critical care was time spent personally by me on the following activities: development of treatment plan with patient and/or surrogate as well as nursing, discussions with consultants, evaluation of patient's response to treatment, examination of patient, obtaining history from patient or surrogate, ordering and performing treatments and interventions, ordering and review of laboratory studies, ordering and review of radiographic studies, pulse oximetry and re-evaluation of patient's condition. MDM   Final diagnoses:  Seizure   Complex clinical scenario presenting with obtundation, left arm jerking, head moving back and forth.  On initial examination, patient was unable to answer questions or follow commands.  Stat CT of head was negative. Tele-neurology consult obtained. Neurologist believes  symptoms were consistent with a sizure.  Ativan 1 mg  IV given.  Patient's symptoms appeared to improve. She was postictal but followed commands and was moving all 4 extremities.  She is alert and oriented x3.  Discussed with Dr. Rhona Leavens.  Admit to general medicine.  I personally performed the services described in this documentation, which was scribed in my presence. The recorded information has been reviewed and is accurate.   Donnetta Hutching, MD 07/21/14 [phone]

## 2014-07-22 ENCOUNTER — Inpatient Hospital Stay (HOSPITAL_COMMUNITY): Payer: Medicare HMO

## 2014-07-22 ENCOUNTER — Inpatient Hospital Stay (HOSPITAL_COMMUNITY)
Admit: 2014-07-22 | Discharge: 2014-07-22 | Disposition: A | Discharge status: Home / Self Care | Payer: Medicare HMO | Attending: Internal Medicine | Admitting: Internal Medicine

## 2014-07-22 DIAGNOSIS — G40909 Epilepsy, unspecified, not intractable, without status epilepticus: Secondary | ICD-10-CM | POA: Diagnosis not present

## 2014-07-22 LAB — GLUCOSE, CAPILLARY
GLUCOSE-CAPILLARY: 118 mg/dL — AB (ref 70–99)
GLUCOSE-CAPILLARY: 120 mg/dL — AB (ref 70–99)
GLUCOSE-CAPILLARY: 149 mg/dL — AB (ref 70–99)
Glucose-Capillary: 92 mg/dL (ref 70–99)
Glucose-Capillary: 119 mg/dL — ABNORMAL HIGH (ref 70–99)
Glucose-Capillary: 147 mg/dL — ABNORMAL HIGH (ref 70–99)

## 2014-07-22 LAB — HEMOGLOBIN A1C
Hgb A1c MFr Bld: 6.5 % — ABNORMAL HIGH (ref <5.7)
MEAN PLASMA GLUCOSE: 140 mg/dL — AB (ref <117)

## 2014-07-22 LAB — COMPREHENSIVE METABOLIC PANEL
ALBUMIN: 3.7 g/dL (ref 3.5–5.2)
ALT: 85 U/L — ABNORMAL HIGH (ref 0–35)
ANION GAP: 10 (ref 5–15)
AST: 93 U/L — ABNORMAL HIGH (ref 0–37)
Alkaline Phosphatase: 95 U/L (ref 39–117)
BUN: 11 mg/dL (ref 6–23)
CALCIUM: 8.6 mg/dL (ref 8.4–10.5)
CHLORIDE: 106 meq/L (ref 96–112)
CO2: 27 meq/L (ref 19–32)
CREATININE: 0.86 mg/dL (ref 0.50–1.10)
GFR calc Af Amer: 85 mL/min — ABNORMAL LOW (ref >90)
GFR, EST NON AFRICAN AMERICAN: 73 mL/min — AB (ref >90)
Glucose, Bld: 128 mg/dL — ABNORMAL HIGH (ref 70–99)
Potassium: 4.5 mEq/L (ref 3.7–5.3)
Sodium: 143 mEq/L (ref 137–147)
Total Bilirubin: 0.4 mg/dL (ref 0.3–1.2)
Total Protein: 7 g/dL (ref 6.0–8.3)

## 2014-07-22 LAB — LIPID PANEL
CHOLESTEROL: 193 mg/dL (ref 0–200)
HDL: 37 mg/dL — ABNORMAL LOW (ref >39)
LDL Cholesterol: 111 mg/dL — ABNORMAL HIGH (ref 0–99)
Total CHOL/HDL Ratio: 5.2 RATIO
Triglycerides: 226 mg/dL — ABNORMAL HIGH (ref <150)
VLDL: 45 mg/dL — ABNORMAL HIGH (ref 0–40)

## 2014-07-22 LAB — CBC
HCT: 43.6 % (ref 36.0–46.0)
Hemoglobin: 14 g/dL (ref 12.0–15.0)
MCH: 30 pg (ref 26.0–34.0)
MCHC: 32.1 g/dL (ref 30.0–36.0)
MCV: 93.4 fL (ref 78.0–100.0)
PLATELETS: 187 10*3/uL (ref 150–400)
RBC: 4.67 MIL/uL (ref 3.87–5.11)
RDW: 12.9 % (ref 11.5–15.5)
WBC: 4.5 10*3/uL (ref 4.0–10.5)

## 2014-07-22 MED ORDER — LEVETIRACETAM 500 MG PO TABS
500.0000 mg | ORAL_TABLET | Freq: Two times a day (BID) | ORAL | Status: DC
  Administered 2014-07-22 – 2014-07-23 (×3): 500 mg via ORAL
  Filled 2014-07-22: qty 1
  Filled 2014-07-22: qty 2
  Filled 2014-07-22: qty 1

## 2014-07-22 MED ORDER — INSULIN ASPART 100 UNIT/ML ~~LOC~~ SOLN: 0.0000 [IU] | Freq: Every day | SUBCUTANEOUS | Status: DC

## 2014-07-22 MED ORDER — INSULIN ASPART 100 UNIT/ML ~~LOC~~ SOLN
0.0000 [IU] | Freq: Three times a day (TID) | SUBCUTANEOUS | Status: DC
  Administered 2014-07-23: 3 [IU] via SUBCUTANEOUS

## 2014-07-22 NOTE — Consult Note (Signed)
Chloe Alfred A. Merlene Laughter, MD     [email or web address]          [name] is an 58 y.o. female.   ASSESSMENT/PLAN: 1. Likely new onset seizures. The slightly abnormal therefore we will continue with the patient's Keppra. Seizure precaution is advised.   2. Obstructive sleep apnea syndrome.  3. Morbid obesity.  This is a 58 year old white female who was found by her husband to be unresponsive and frothing at the mouth. She was subsequently noted to be having head movements although somewhat atypical for seizures. She also had thrashing of the upper extremities. The patient is amnestic to the event. It appears that she had another event while she was being taken to the emergency room. No oral trauma is reported. There is no urinary or bowel incontinence. There is no personal history of seizures. She apparently does have a history of "mini stroke". These per year events are associated with dysarthria. She tells me that she has ongoing dysarthria from these mini strokes. There is no history of prematurity, head injuries or CNS infections. There is no family history of seizures. She does not report having chest pain, shortness of breath or dyspnea. She does have ongoing psychosocial stresses although she reports this is nothing unusual. The review of systems otherwise negative. She does have a history of obstructive sleep apnea syndrome but is not using her machine. She tells me that she has asthma and use her machine seem to make her symptoms worse including causing issues with claustrophobia.  GENERAL: This is a morbidly obese female in no acute distress.  HEENT: Supple. Atraumatic normocephalic. Shows a large stocky neck. There is a large tongue and crowded of the posterior space. No oral trauma is identified.  ABDOMEN: soft  EXTREMITIES: No edema. Incisional scar status post bilateral total knee arthroplasty.   BACK: Normal.  SKIN: Normal by inspection.    MENTAL  STATUS: Alert and oriented. Speech, language and cognition are generally intact. Judgment and insight normal.   CRANIAL NERVES: Pupils are equal, round and reactive to light and accommodation; extra ocular movements are full, there is no significant nystagmus; visual fields are full; upper and lower facial muscles are normal in strength and symmetric, there is no flattening of the nasolabial folds; tongue is midline; uvula is midline; shoulder elevation is normal.  MOTOR: Normal tone, bulk and strength; no pronator drift.  COORDINATION: Left finger to nose is normal, right finger to nose is normal, No rest tremor; no intention tremor; no postural tremor; no bradykinesia.  REFLEXES: Deep tendon reflexes are symmetrical and normal- Although markedly diminished at the knees status post surgery. Babinski reflexes are flexor bilaterally.   SENSATION: Normal to light touch.     Past Medical History  Diagnosis Date  . Diabetes mellitus   . Hypertension   . Asthma   . Overactive bladder   . High cholesterol   . Fibromyalgia   . Panic attacks   . Obstructive sleep apnea     Noncompliant with CPAP due to claustophobia.  . H/O cardiac catheterization     No significant CAD (only 20% LAD) 03/02/13 with normal LV function.  . Rectocele 05/21/2013  . OAB (overactive bladder) 05/21/2013    Past Surgical History  Procedure Laterality Date  . Abdominal hysterectomy    . Knee replacements      bilateral  . Back surgery    . Tonsillectomy    . Carpal tunnel release    .  Ankle surgery      Family History  Problem Relation Age of Onset  . Heart attack Mother   . Diabetes Mother   . Hypertension Mother   . Sick sinus syndrome Brother     Pacemaker  . Congenital heart disease Brother     CABG  . Cancer Maternal Aunt     breast, liver,lung  . Cancer Cousin     leukemia  . Stroke Brother   . Coronary artery disease Brother     Social History:  reports that she has quit smoking. Her  smoking use included Cigarettes. She smoked 0.00 packs per day for 1 year. She has never used smokeless tobacco. She reports that she does not drink alcohol or use illicit drugs.  Allergies:  Allergies  Allergen Reactions  . Aspirin Nausea And Vomiting  . Codeine Rash    Medications: Prior to Admission medications   Medication Sig Start Date End Date Taking? Authorizing Provider  albuterol (PROVENTIL HFA;VENTOLIN HFA) 108 (90 BASE) MCG/ACT inhaler Inhale 2 puffs into the lungs every 4 (four) hours as needed. Shortness of Breath    Yes Historical Provider, MD  atorvastatin (LIPITOR) 10 MG tablet Take 10 mg by mouth at bedtime.   Yes Historical Provider, MD  escitalopram (LEXAPRO) 20 MG tablet Take 20 mg by mouth daily.  03/09/14  Yes Historical Provider, MD  glimepiride (AMARYL) 2 MG tablet Take 1 mg by mouth daily.    Yes Historical Provider, MD  imipramine (TOFRANIL) 50 MG tablet TAKE (1) TABLET BY MOUTH AT BEDTIME. 06/17/14  Yes Estill Dooms, NP  levothyroxine (SYNTHROID, LEVOTHROID) 50 MCG tablet Take 50 mcg by mouth daily before breakfast.  03/09/14  Yes Historical Provider, MD  metFORMIN (GLUCOPHAGE) 500 MG tablet Take 500 mg by mouth 2 (two) times daily with a meal.  03/26/14  Yes Historical Provider, MD  metoprolol (LOPRESSOR) 50 MG tablet Take 50 mg by mouth 2 (two) times daily.     Yes Historical Provider, MD  mirabegron ER (MYRBETRIQ) 25 MG TB24 Take 1 tablet (25 mg total) by mouth daily. 05/21/13  Yes Estill Dooms, NP  nystatin (MYCOSTATIN/NYSTOP) 100000 UNIT/GM POWD Apply 1 g topically 3 (three) times daily. 11/19/13  Yes Estill Dooms, NP  PRESCRIPTION MEDICATION Place 2 drops into both eyes 2 (two) times daily. For dry eyes.   Yes Historical Provider, MD  TOVIAZ 8 MG TB24 tablet TAKE ONE TABLET BY MOUTH ONCE DAILY. 03/09/14  Yes Estill Dooms, NP    Scheduled Meds: . aspirin  325 mg Oral Daily  . atorvastatin  10 mg Oral QHS  . escitalopram  20 mg Oral Daily    . heparin  5,000 Units Subcutaneous 3 times per day  . insulin aspart  0-20 Units Subcutaneous TID WC  . insulin aspart  0-5 Units Subcutaneous QHS  . levETIRAcetam  500 mg Oral BID  . levothyroxine  50 mcg Oral QAC breakfast  . metoprolol  50 mg Oral BID   Continuous Infusions:  PRN Meds:.acetaminophen, acetaminophen, albuterol, LORazepam, ondansetron (ZOFRAN) IV, ondansetron   Blood pressure 122/66, pulse 57, temperature 98.7 F (37.1 C), temperature source Oral, resp. rate 20, height _0  (1.702 m), weight 135.172 kg (298 lb), SpO2 96.00%.   Results for orders placed during the hospital encounter of 07/21/14 (from the past 48 hour(s))  ETHANOL     Status: None   Collection Time    07/21/14 11:14 AM  Result Value Ref Range   Alcohol, Ethyl (B) <11  0 - 11 mg/dL   Comment:            LOWEST DETECTABLE LIMIT FOR     SERUM ALCOHOL IS 11 mg/dL     FOR MEDICAL PURPOSES ONLY  PROTIME-INR     Status: None   Collection Time    07/21/14 11:14 AM      Result Value Ref Range   Prothrombin Time 13.8  11.6 - 15.2 seconds   INR 1.06  0.00 - 1.49  APTT     Status: None   Collection Time    07/21/14 11:14 AM      Result Value Ref Range   aPTT 29  24 - 37 seconds  CBC     Status: Abnormal   Collection Time    07/21/14 11:14 AM      Result Value Ref Range   WBC 3.9 (*) 4.0 - 10.5 K/uL   RBC 4.66  3.87 - 5.11 MIL/uL   Hemoglobin 14.0  12.0 - 15.0 g/dL   HCT 42.1  36.0 - 46.0 %   MCV 90.3  78.0 - 100.0 fL   MCH 30.0  26.0 - 34.0 pg   MCHC 33.3  30.0 - 36.0 g/dL   RDW 12.8  11.5 - 15.5 %   Platelets 168  150 - 400 K/uL  DIFFERENTIAL     Status: Abnormal   Collection Time    07/21/14 11:14 AM      Result Value Ref Range   Neutrophils Relative % 45  43 - 77 %   Neutro Abs 1.8  1.7 - 7.7 K/uL   Lymphocytes Relative 40  12 - 46 %   Lymphs Abs 1.5  0.7 - 4.0 K/uL   Monocytes Relative 10  3 - 12 %   Monocytes Absolute 0.4  0.1 - 1.0 K/uL   Eosinophils Relative 3  0 - 5 %    Eosinophils Absolute 0.1  0.0 - 0.7 K/uL   Basophils Relative 2 (*) 0 - 1 %   Basophils Absolute 0.1  0.0 - 0.1 K/uL  COMPREHENSIVE METABOLIC PANEL     Status: Abnormal   Collection Time    07/21/14 11:14 AM      Result Value Ref Range   Sodium 140  137 - 147 mEq/L   Potassium 4.6  3.7 - 5.3 mEq/L   Chloride 104  96 - 112 mEq/L   CO2 26  19 - 32 mEq/L   Glucose, Bld 149 (*) 70 - 99 mg/dL   BUN 13  6 - 23 mg/dL   Creatinine, Ser 0.84  0.50 - 1.10 mg/dL   Calcium 8.9  8.4 - 10.5 mg/dL   Total Protein 7.0  6.0 - 8.3 g/dL   Albumin 3.9  3.5 - 5.2 g/dL   AST 82 (*) 0 - 37 U/L   ALT 77 (*) 0 - 35 U/L   Alkaline Phosphatase 95  39 - 117 U/L   Total Bilirubin 0.6  0.3 - 1.2 mg/dL   GFR calc non Af Amer 75 (*) >90 mL/min   GFR calc Af Amer 87 (*) >90 mL/min   Comment: (NOTE)     The eGFR has been calculated using the CKD EPI equation.     This calculation has not been validated in all clinical situations.     eGFR's persistently <90 mL/min signify possible Chronic Kidney     Disease.  Anion gap 10  5 - 15  TROPONIN I     Status: None   Collection Time    07/21/14 11:14 AM      Result Value Ref Range   Troponin I <0.30  <0.30 ng/mL   Comment:            Due to the release kinetics of cTnI,     a negative result within the first hours     of the onset of symptoms does not rule out     myocardial infarction with certainty.     If myocardial infarction is still suspected,     repeat the test at appropriate intervals.  URINE RAPID DRUG SCREEN (HOSP PERFORMED)     Status: None   Collection Time    07/21/14 11:46 AM      Result Value Ref Range   Opiates NONE DETECTED  NONE DETECTED   Cocaine NONE DETECTED  NONE DETECTED   Benzodiazepines NONE DETECTED  NONE DETECTED   Amphetamines NONE DETECTED  NONE DETECTED   Tetrahydrocannabinol NONE DETECTED  NONE DETECTED   Barbiturates NONE DETECTED  NONE DETECTED   Comment:            DRUG SCREEN FOR MEDICAL PURPOSES     ONLY.  IF  CONFIRMATION IS NEEDED     FOR ANY PURPOSE, NOTIFY LAB     WITHIN 5 DAYS.                LOWEST DETECTABLE LIMITS     FOR URINE DRUG SCREEN     Drug Class       Cutoff (ng/mL)     Amphetamine      1000     Barbiturate      200     Benzodiazepine   341     Tricyclics       937     Opiates          300     Cocaine          300     THC              50  URINALYSIS, ROUTINE W REFLEX MICROSCOPIC     Status: None   Collection Time    07/21/14 11:46 AM      Result Value Ref Range   Color, Urine YELLOW  YELLOW   APPearance CLEAR  CLEAR   Specific Gravity, Urine 1.015  1.005 - 1.030   pH 7.5  5.0 - 8.0   Glucose, UA NEGATIVE  NEGATIVE mg/dL   Hgb urine dipstick NEGATIVE  NEGATIVE   Bilirubin Urine NEGATIVE  NEGATIVE   Ketones, ur NEGATIVE  NEGATIVE mg/dL   Protein, ur NEGATIVE  NEGATIVE mg/dL   Urobilinogen, UA 0.2  0.0 - 1.0 mg/dL   Nitrite NEGATIVE  NEGATIVE   Leukocytes, UA NEGATIVE  NEGATIVE   Comment: MICROSCOPIC NOT DONE ON URINES WITH NEGATIVE PROTEIN, BLOOD, LEUKOCYTES, NITRITE, OR GLUCOSE <1000 mg/dL.  GLUCOSE, CAPILLARY     Status: Abnormal   Collection Time    07/21/14  4:41 PM      Result Value Ref Range   Glucose-Capillary 137 (*) 70 - 99 mg/dL  GLUCOSE, CAPILLARY     Status: Abnormal   Collection Time    07/21/14  9:03 PM      Result Value Ref Range   Glucose-Capillary 115 (*) 70 - 99 mg/dL  GLUCOSE, CAPILLARY     Status: Abnormal  Collection Time    07/21/14 11:52 PM      Result Value Ref Range   Glucose-Capillary 149 (*) 70 - 99 mg/dL  GLUCOSE, CAPILLARY     Status: Abnormal   Collection Time    07/22/14  4:42 AM      Result Value Ref Range   Glucose-Capillary 120 (*) 70 - 99 mg/dL  COMPREHENSIVE METABOLIC PANEL     Status: Abnormal   Collection Time    07/22/14  6:06 AM      Result Value Ref Range   Sodium 143  137 - 147 mEq/L   Potassium 4.5  3.7 - 5.3 mEq/L   Chloride 106  96 - 112 mEq/L   CO2 27  19 - 32 mEq/L   Glucose, Bld 128 (*) 70 - 99 mg/dL     BUN 11  6 - 23 mg/dL   Creatinine, Ser 0.86  0.50 - 1.10 mg/dL   Calcium 8.6  8.4 - 10.5 mg/dL   Total Protein 7.0  6.0 - 8.3 g/dL   Albumin 3.7  3.5 - 5.2 g/dL   AST 93 (*) 0 - 37 U/L   ALT 85 (*) 0 - 35 U/L   Alkaline Phosphatase 95  39 - 117 U/L   Total Bilirubin 0.4  0.3 - 1.2 mg/dL   GFR calc non Af Amer 73 (*) >90 mL/min   GFR calc Af Amer 85 (*) >90 mL/min   Comment: (NOTE)     The eGFR has been calculated using the CKD EPI equation.     This calculation has not been validated in all clinical situations.     eGFR's persistently <90 mL/min signify possible Chronic Kidney     Disease.   Anion gap 10  5 - 15  CBC     Status: None   Collection Time    07/22/14  6:06 AM      Result Value Ref Range   WBC 4.5  4.0 - 10.5 K/uL   RBC 4.67  3.87 - 5.11 MIL/uL   Hemoglobin 14.0  12.0 - 15.0 g/dL   HCT 43.6  36.0 - 46.0 %   MCV 93.4  78.0 - 100.0 fL   MCH 30.0  26.0 - 34.0 pg   MCHC 32.1  30.0 - 36.0 g/dL   RDW 12.9  11.5 - 15.5 %   Platelets 187  150 - 400 K/uL  HEMOGLOBIN A1C     Status: Abnormal   Collection Time    07/22/14  6:06 AM      Result Value Ref Range   Hemoglobin A1C 6.5 (*) <5.7 %   Comment: (NOTE)                                                                               According to the ADA Clinical Practice Recommendations for 2011, when     HbA1c is used as a screening test:      >=6.5%   Diagnostic of Diabetes Mellitus               (if abnormal result is confirmed)     5.7-6.4%   Increased risk of developing Diabetes Mellitus  References:Diagnosis and Classification of Diabetes Mellitus,Diabetes     CXKG,8185,63(JSHFW 1):S62-S69 and Standards of Medical Care in             Diabetes - 2011,Diabetes YOVZ,8588,50 (Suppl 1):S11-S61.   Mean Plasma Glucose 140 (*) <117 mg/dL   Comment: Performed at [facility]     Status: Abnormal   Collection Time    07/22/14  6:07 AM      Result Value Ref Range   Cholesterol 193  0 - 200  mg/dL   Triglycerides 226 (*) <150 mg/dL   HDL 37 (*) >39 mg/dL   Total CHOL/HDL Ratio 5.2     VLDL 45 (*) 0 - 40 mg/dL   LDL Cholesterol 111 (*) 0 - 99 mg/dL   Comment:            Total Cholesterol/HDL:CHD Risk     Coronary Heart Disease Risk Table                         Men   Women      1/2 Average Risk   3.4   3.3      Average Risk       5.0   4.4      2 X Average Risk   9.6   7.1      3 X Average Risk  23.4   11.0                Use the calculated Patient Ratio     above and the CHD Risk Table     to determine the patient's CHD Risk.                ATP III CLASSIFICATION (LDL):      <100     mg/dL   Optimal      100-129  mg/dL   Near or Above                        Optimal      130-159  mg/dL   Borderline      160-189  mg/dL   High      >190     mg/dL   Very High  GLUCOSE, CAPILLARY     Status: Abnormal   Collection Time    07/22/14  7:38 AM      Result Value Ref Range   Glucose-Capillary 118 (*) 70 - 99 mg/dL   Comment 1 Notify RN    GLUCOSE, CAPILLARY     Status: None   Collection Time    07/22/14 11:54 AM      Result Value Ref Range   Glucose-Capillary 92  70 - 99 mg/dL   Comment 1 Notify RN    GLUCOSE, CAPILLARY     Status: Abnormal   Collection Time    07/22/14  4:03 PM      Result Value Ref Range   Glucose-Capillary 119 (*) 70 - 99 mg/dL   Comment 1 Notify RN      Ct Head Wo Contrast  07/21/2014   CLINICAL DATA:  57 year old female with sudden onset altered mental status. Code stroke.  EXAM: CT HEAD WITHOUT CONTRAST  TECHNIQUE: Contiguous axial images were obtained from the base of the skull through the vertex without intravenous contrast.  COMPARISON:  02/02/2013 and prior CTs  FINDINGS: Mild generalized cerebral volume loss noted.  No acute intracranial abnormalities  are identified, including mass lesion or mass effect, hydrocephalus, extra-axial fluid collection, midline shift, hemorrhage, or acute infarction.  The visualized bony calvarium is unremarkable.   IMPRESSION: No evidence of acute intracranial abnormality.  Critical Value/emergent results were called by telephone at the time of interpretation on 07/21/2014 at 11:12 am to Dr. Nat Christen , who verbally acknowledged these results.   Electronically Signed   By: Hassan Rowan M.D.   On: 07/21/2014 11:12   Mr Brain Wo Contrast  07/22/2014   CLINICAL DATA:  Syncopal episode and left-sided weakness. Possible seizure.  The examination had to be discontinued prior to completion due to patient refusal for further imaging.  EXAM: MRI HEAD WITHOUT CONTRAST  TECHNIQUE: Multiplanar, multiecho pulse sequences of the brain and surrounding structures were obtained without intravenous contrast.  COMPARISON:  CT head without contrast 07/21/2014. MRI brain 08/15/2007.  FINDINGS: The diffusion-weighted images demonstrate no evidence for acute or subacute infarction. No hemorrhage or mass lesion is present.  The ventricles are of normal size. No significant extraaxial fluid collection is present. Minimal white matter change scratch the minimal periventricular white matter change is evident. This is somewhat obscured by patient motion.  Flow is present in the major intracranial arteries. The globes and orbits are intact.  Mild diffuse mucosal thickening is present throughout the paranasal sinuses, right greater than left. Minimal fluid is present in the mastoid air cells. No obstructing nasopharyngeal lesion is evident.  IMPRESSION: 1. No acute intracranial abnormality. No focal lesion to explain seizures. 2. Minimal white matter disease is somewhat obscured by patient motion.   Electronically Signed   By: Lawrence Santiago M.D.   On: 07/22/2014 09:53   US Carotid Bilateral  07/22/2014   CLINICAL DATA:  Stroke  EXAM: BILATERAL CAROTID DUPLEX ULTRASOUND  TECHNIQUE: Pearline Cables scale imaging, color Doppler and duplex ultrasound were performed of bilateral carotid and vertebral arteries in the neck.  COMPARISON:  08/15/2007  FINDINGS: Criteria:  Quantification of carotid stenosis is based on velocity parameters that correlate the residual internal carotid diameter with NASCET-based stenosis levels, using the diameter of the distal internal carotid lumen as the denominator for stenosis measurement.  The following velocity measurements were obtained:  RIGHT  ICA:  66 cm/sec  CCA:  51 cm/sec  SYSTOLIC ICA/CCA RATIO:  1.3  DIASTOLIC ICA/CCA RATIO:  ECA:  96 cm/sec  LEFT  ICA:  65 cm/sec  CCA:  65 cm/sec  SYSTOLIC ICA/CCA RATIO:  1.0  DIASTOLIC ICA/CCA RATIO:  ECA:  76 cm/sec  RIGHT CAROTID ARTERY: Mild irregular plaque in the bulb  RIGHT VERTEBRAL ARTERY:  Antegrade.  LEFT CAROTID ARTERY:  Mild calcified plaque in the bulb.  LEFT VERTEBRAL ARTERY:  Antegrade.  IMPRESSION: Less than 50% stenosis in the right and left internal carotid arteries.   Electronically Signed   By: Maryclare Bean M.D.   On: 07/22/2014 16:31        Lizanne Erker A. Merlene Greene, M.D.  Diplomate, Tax adviser of Psychiatry and Neurology ( Neurology). 07/22/2014, 5:58 PM

## 2014-07-22 NOTE — Care Management Note (Addendum)
    Page 1 of 1   07/23/2014     3:12:52 PM CARE MANAGEMENT NOTE 07/23/2014  Patient:  [name], [name]   Account Number:  [id number]  Date Initiated:  07/22/2014  Documentation initiated by:  Kathyrn Sheriff  Subjective/Objective Assessment:   Pt admited with symptoms of CVA. Patient from home with husband and family. Patient independent with ADL's. Patient has cane and walker at home but does not use. Patient has CPAP at home (from [facility]) but does not use.     Action/Plan:   Pt plans to discharge home with self care. No CM needs identified at this point.   Anticipated DC Date:  07/22/2014   Anticipated DC Plan:  HOME/SELF CARE      DC Planning Services  CM consult      Choice offered to / List presented to:             Status of service:  Completed, signed off Medicare Important Message given?   (If response is "NO", the following Medicare IM given date fields will be blank) Date Medicare IM given:   Medicare IM given by:   Date Additional Medicare IM given:   Additional Medicare IM given by:    Discharge Disposition:  HOME/SELF CARE  Per UR Regulation:    If discussed at Long Length of Stay Meetings, dates discussed:    Comments:  07/23/2014 1500 Kathyrn Sheriff, RN, MSN, [facility] Patient discharged today, home with self care. No HH needs identified. No CM needs at discharge.   07/22/2014 1030 Kathyrn Sheriff, RN, MSN, [employer]

## 2014-07-22 NOTE — Progress Notes (Signed)
EEG completed; results pending.    

## 2014-07-22 NOTE — Procedures (Signed)
  [facility] Darlys Buis A. Gerilyn Pilgrim, MD     [email or web address]           HISTORY: This is a 58 year old lady who presents with confusion and altered mental status. She had what appears to be a convulsive seizure. There was frothing of the mouth. She had another event following this with the shaking of the upper extremities.  MEDICATIONS: Scheduled Meds: . aspirin  325 mg Oral Daily  . atorvastatin  10 mg Oral QHS  . escitalopram  20 mg Oral Daily  . heparin  5,000 Units Subcutaneous 3 times per day  . insulin aspart  0-20 Units Subcutaneous TID WC  . insulin aspart  0-5 Units Subcutaneous QHS  . levETIRAcetam  500 mg Oral BID  . levothyroxine  50 mcg Oral QAC breakfast  . metoprolol  50 mg Oral BID   Continuous Infusions:  PRN Meds:.acetaminophen, acetaminophen, albuterol, LORazepam, ondansetron (ZOFRAN) IV, ondansetron  Prior to Admission medications   Medication Sig Start Date End Date Taking? Authorizing Provider  albuterol (PROVENTIL HFA;VENTOLIN HFA) 108 (90 BASE) MCG/ACT inhaler Inhale 2 puffs into the lungs every 4 (four) hours as needed. Shortness of Breath    Yes Historical Provider, MD  atorvastatin (LIPITOR) 10 MG tablet Take 10 mg by mouth at bedtime.   Yes Historical Provider, MD  escitalopram (LEXAPRO) 20 MG tablet Take 20 mg by mouth daily.  03/09/14  Yes Historical Provider, MD  glimepiride (AMARYL) 2 MG tablet Take 1 mg by mouth daily.    Yes Historical Provider, MD  imipramine (TOFRANIL) 50 MG tablet TAKE (1) TABLET BY MOUTH AT BEDTIME. 06/17/14  Yes Adline Potter, NP  levothyroxine (SYNTHROID, LEVOTHROID) 50 MCG tablet Take 50 mcg by mouth daily before breakfast.  03/09/14  Yes Historical Provider, MD  metFORMIN (GLUCOPHAGE) 500 MG tablet Take 500 mg by mouth 2 (two) times daily with a meal.  03/26/14  Yes Historical Provider, MD  metoprolol (LOPRESSOR) 50 MG tablet Take 50 mg by mouth 2 (two) times daily.     Yes Historical Provider, MD  mirabegron ER  (MYRBETRIQ) 25 MG TB24 Take 1 tablet (25 mg total) by mouth daily. 05/21/13  Yes Adline Potter, NP  nystatin (MYCOSTATIN/NYSTOP) 100000 UNIT/GM POWD Apply 1 g topically 3 (three) times daily. 11/19/13  Yes Adline Potter, NP  PRESCRIPTION MEDICATION Place 2 drops into both eyes 2 (two) times daily. For dry eyes.   Yes Historical Provider, MD  TOVIAZ 8 MG TB24 tablet TAKE ONE TABLET BY MOUTH ONCE DAILY. 03/09/14  Yes Adline Potter, NP      ANALYSIS: A 16 channel recording using standard 10 20 measurements is conducted for 20 minutes. There is a well-formed posterior dominant rhythm of 10 Hz which attenuates with eye opening. There is beta activity observed in frontal areas. There is awake and drowsy activity with some evidence of sleep. Spindles are observed briefly. Photic simulation is carried out without abnormal changes. The patient is noted to have 2 episodes of generalized spike slow wave activity with maximum in the bifrontal regions bilaterally. No clear evidence of ongoing electrographic seizures are observed.    IMPRESSION: 1. This recording is mildly abnormal showing rare generalized spike slow wave activity which can correlate with generalized epilepsy syndromes.      Trevionne Advani A. Gerilyn Pilgrim, M.D.  Diplomate, Biomedical engineer of Psychiatry and Neurology ( Neurology).

## 2014-07-22 NOTE — Progress Notes (Signed)
[facility] PROGRESS NOTE  [name]:[id number] DOB: 25-Oct-1956 DOA: 07/21/2014 PCP: [name], MD  Assessment/Plan: 1. Possible seizures  1. Cont on keppra 500mg  q12hrs 2. PRN ativan for seizures 3. Consulted Neurology for any further recs 4. MRI brain pending 2. HTN  1. BP stable and controlled 2. Cont home meds 3. DM  1. Holding oral DM meds while inpatient 2. Cont on SSI coverage 3. Will check a1c 4. L arm weakness  1. Initial head CT unremarkable 2. MRI brain pending 3. Follow up 2d echo and carotid dopplers 4. Follow up  a1c 5. lipids with LDL of 111 6. Continue home statin 5. DVT prophylaxis  1. Heparin subq  Code Status: Full Family Communication: Pt and husband in room (indicate person spoken with, relationship, and if by phone, the number) Disposition Plan: Pending  Consultants:  Neurology  Procedures:    Antibiotics:   (indicate start date, and stop date if known)  HPI/Subjective: No complaints  Objective: Filed Vitals:   07/21/14 1356 07/21/14 1500 07/21/14 2028 07/22/14 0439  BP: 119/62 106/70 135/74 116/52  Pulse: 81 77 75 61  Temp: 98 F (36.7 C) 97.5 F (36.4 C) 98.1 F (36.7 C) 97.7 F (36.5 C)  TempSrc: Oral Oral Oral Oral  Resp:   20 20  Height: 5\' 7"  (1.702 m)     Weight: 135.172 kg (298 lb)     SpO2: 92% 92% 96% 100%    Intake/Output Summary (Last 24 hours) at 07/22/14 0902 Last data filed at 07/22/14 0830  Gross per 24 hour  Intake    360 ml  Output    400 ml  Net    -40 ml   Filed Weights   07/21/14 1356  Weight: 135.172 kg (298 lb)    Exam:   General:  Awake, in nad  Cardiovascular: regular, s1, s2  Respiratory: normal resp effort, no wheezing  Abdomen: soft, nondistended  Musculoskeletal: perfused, no clubbing   Data Reviewed: Basic Metabolic Panel:  Recent Labs Lab 07/21/14 1114 07/22/14 0606  NA 140 143  K 4.6 4.5  CL 104 106  CO2 26 27  GLUCOSE 149* 128*  BUN 13 11   CREATININE 0.84 0.86  CALCIUM 8.9 8.6   Liver Function Tests:  Recent Labs Lab 07/21/14 1114 07/22/14 0606  AST 82* 93*  ALT 77* 85*  ALKPHOS 95 95  BILITOT 0.6 0.4  PROT 7.0 7.0  ALBUMIN 3.9 3.7   No results found for this basename: LIPASE, AMYLASE,  in the last 168 hours No results found for this basename: AMMONIA,  in the last 168 hours CBC:  Recent Labs Lab 07/21/14 1114 07/22/14 0606  WBC 3.9* 4.5  NEUTROABS 1.8  --   HGB 14.0 14.0  HCT 42.1 43.6  MCV 90.3 93.4  PLT 168 187   Cardiac Enzymes:  Recent Labs Lab 07/21/14 1114  TROPONINI <0.30   BNP (last 3 results) No results found for this basename: PROBNP,  in the last 8760 hours CBG:  Recent Labs Lab 07/21/14 1641 07/21/14 2103 07/21/14 2352 07/22/14 0442 07/22/14 0738  GLUCAP 137* 115* 149* 120* 118*    No results found for this or any previous visit (from the past 240 hour(s)).   Studies: Ct Head Wo Contrast  07/21/2014   CLINICAL DATA:  58 year old female with sudden onset altered mental status. Code stroke.  EXAM: CT HEAD WITHOUT CONTRAST  TECHNIQUE: Contiguous axial images were obtained from the base of the  skull through the vertex without intravenous contrast.  COMPARISON:  02/02/2013 and prior CTs  FINDINGS: Mild generalized cerebral volume loss noted.  No acute intracranial abnormalities are identified, including mass lesion or mass effect, hydrocephalus, extra-axial fluid collection, midline shift, hemorrhage, or acute infarction.  The visualized bony calvarium is unremarkable.  IMPRESSION: No evidence of acute intracranial abnormality.  Critical Value/emergent results were called by telephone at the time of interpretation on 07/21/2014 at 11:12 am to Dr. Donnetta Hutching , who verbally acknowledged these results.   Electronically Signed   By: Laveda Abbe M.D.   On: 07/21/2014 11:12    Scheduled Meds: . sodium chloride   Intravenous STAT  . aspirin  325 mg Oral Daily  . atorvastatin  10 mg Oral QHS   . escitalopram  20 mg Oral Daily  . heparin  5,000 Units Subcutaneous 3 times per day  . insulin aspart  0-20 Units Subcutaneous 6 times per day  . levETIRAcetam  500 mg Intravenous Q12H  . levothyroxine  50 mcg Oral QAC breakfast  . metoprolol  50 mg Oral BID  . pneumococcal 23 valent vaccine  0.5 mL Intramuscular Tomorrow-1000   Continuous Infusions:   Principal Problem:   Seizure Active Problems:   HIGH BLOOD PRESSURE   Diabetes   Morbid obesity  Time spent:  Jennavieve Arrick K  [facility] Pager [phone]. If 7PM-7AM, please contact night-coverage at [email or web address], password [facility] 07/22/2014, 9:02 AM  LOS: 1 day

## 2014-07-23 DIAGNOSIS — M86679 Other chronic osteomyelitis, unspecified ankle and foot: Secondary | ICD-10-CM

## 2014-07-23 LAB — GLUCOSE, CAPILLARY
GLUCOSE-CAPILLARY: 104 mg/dL — AB (ref 70–99)
GLUCOSE-CAPILLARY: 158 mg/dL — AB (ref 70–99)
Glucose-Capillary: 121 mg/dL — ABNORMAL HIGH (ref 70–99)
Glucose-Capillary: 123 mg/dL — ABNORMAL HIGH (ref 70–99)

## 2014-07-23 MED ORDER — LEVETIRACETAM 500 MG PO TABS: 500.0000 mg | ORAL_TABLET | Freq: Two times a day (BID) | ORAL | Status: DC

## 2014-07-23 NOTE — Progress Notes (Signed)
UR review complete.  

## 2014-07-23 NOTE — Progress Notes (Signed)
Patient states understanding of discharge instructions, prescription given. 

## 2014-07-23 NOTE — Discharge Summary (Signed)
Physician Discharge Summary  [name] VOH:[id number] DOB: [birth year] DOA: 07/21/2014  PCP: [name], MD  Admit date: 07/21/2014 Discharge date: 07/23/2014  Time spent: 35 minutes  Recommendations for Outpatient Follow-up:  1. Follow up with PCP in 1-2 weeks 2. No driving until PCP clears pt for driving  Discharge Diagnoses:  Principal Problem:   Seizure Active Problems:   HIGH BLOOD PRESSURE   Diabetes   Morbid obesity   Discharge Condition: Stable  Diet recommendation: Diabetic  Filed Weights   07/21/14 1356  Weight: 135.172 kg (298 lb)    History of present illness:  See admit h and p from 8/9 for details. Briefly, pt presents to the ED following possible seizure activity.  Hospital Course:  1. Possible seizures  1. Pt was cont on keppra 500mg  q12hrs 2. PRN ativan for seizures 3. Consulted Neurology 4. MRI brain was unremarkable 5. EEG with findings of rare generalized spike slow wave activity which correlates with generalized epilepsy 6. Pt has otherwise remained stable and seizure free during this admission  2. HTN  1. BP stable and controlled 2. Cont home meds 3. DM  1. Holding oral DM meds while inpatient 2. Cont on SSI coverage while inpatient 3. A1c of 6.5 4. L arm weakness  1. Initial head CT was unremarkable 2. MRI brain unremarkable 3. Carotid dopplers without stenosis 4. lipids with LDL of 111 5. Continue home statin 6. Resolved shortly upon admission to floor 5. DVT prophylaxis  1. Heparin subq while inpatient  Consultations:  Neurology  Discharge Exam: Filed Vitals:   07/22/14 1449 07/22/14 2050 07/23/14 0430 07/23/14 1400  BP: 122/66 135/91 133/81 118/68  Pulse: 57 61 56 54  Temp: 98.7 F (37.1 C) 99 F (37.2 C) 98.2 F (36.8 C) 98.2 F (36.8 C)  TempSrc: Oral Oral Oral Oral  Resp: 20 20 20 20   Height:      Weight:      SpO2: 96% 95% 99% 99%    General: Awake, in nad Cardiovascular: regular, s1, s2 Respiratory: normal  resp effort, no wheezing  Discharge Instructions     Medication List         albuterol 108 (90 BASE) MCG/ACT inhaler  Commonly known as:  PROVENTIL HFA;VENTOLIN HFA  Inhale 2 puffs into the lungs every 4 (four) hours as needed. Shortness of Breath     atorvastatin 10 MG tablet  Commonly known as:  LIPITOR  Take 10 mg by mouth at bedtime.     escitalopram 20 MG tablet  Commonly known as:  LEXAPRO  Take 20 mg by mouth daily.     glimepiride 2 MG tablet  Commonly known as:  AMARYL  Take 1 mg by mouth daily.     imipramine 50 MG tablet  Commonly known as:  TOFRANIL  TAKE (1) TABLET BY MOUTH AT BEDTIME.     levETIRAcetam 500 MG tablet  Commonly known as:  KEPPRA  Take 1 tablet (500 mg total) by mouth 2 (two) times daily.     levothyroxine 50 MCG tablet  Commonly known as:  SYNTHROID, LEVOTHROID  Take 50 mcg by mouth daily before breakfast.     metFORMIN 500 MG tablet  Commonly known as:  GLUCOPHAGE  Take 500 mg by mouth 2 (two) times daily with a meal.     metoprolol 50 MG tablet  Commonly known as:  LOPRESSOR  Take 50 mg by mouth 2 (two) times daily.     mirabegron ER 25  MG Tb24 tablet  Commonly known as:  MYRBETRIQ  Take 1 tablet (25 mg total) by mouth daily.     nystatin 100000 UNIT/GM Powd  Apply 1 g topically 3 (three) times daily.     PRESCRIPTION MEDICATION  Place 2 drops into both eyes 2 (two) times daily. For dry eyes.     TOVIAZ 8 MG Tb24 tablet  Generic drug:  fesoterodine  TAKE ONE TABLET BY MOUTH ONCE DAILY.       Allergies  Allergen Reactions  . Aspirin Nausea And Vomiting  . Codeine Rash   Follow-up Information   Follow up with [name], MD. Schedule an appointment as soon as possible for a visit in 1 week.   Specialty:  Internal Medicine   Contact information:    [address]  [place] [address] [phone]        The results of significant diagnostics from this hospitalization (including imaging, microbiology, ancillary  and laboratory) are listed below for reference.    Significant Diagnostic Studies: Ct Head Wo Contrast  07/21/2014   CLINICAL DATA:  58 year old female with sudden onset altered mental status. Code stroke.  EXAM: CT HEAD WITHOUT CONTRAST  TECHNIQUE: Contiguous axial images were obtained from the base of the skull through the vertex without intravenous contrast.  COMPARISON:  02/02/2013 and prior CTs  FINDINGS: Mild generalized cerebral volume loss noted.  No acute intracranial abnormalities are identified, including mass lesion or mass effect, hydrocephalus, extra-axial fluid collection, midline shift, hemorrhage, or acute infarction.  The visualized bony calvarium is unremarkable.  IMPRESSION: No evidence of acute intracranial abnormality.  Critical Value/emergent results were called by telephone at the time of interpretation on 07/21/2014 at 11:12 am to Dr. Donnetta Hutching , who verbally acknowledged these results.   Electronically Signed   By: Laveda Abbe M.D.   On: 07/21/2014 11:12   Mr Brain Wo Contrast  07/22/2014   CLINICAL DATA:  Syncopal episode and left-sided weakness. Possible seizure.  The examination had to be discontinued prior to completion due to patient refusal for further imaging.  EXAM: MRI HEAD WITHOUT CONTRAST  TECHNIQUE: Multiplanar, multiecho pulse sequences of the brain and surrounding structures were obtained without intravenous contrast.  COMPARISON:  CT head without contrast 07/21/2014. MRI brain 08/15/2007.  FINDINGS: The diffusion-weighted images demonstrate no evidence for acute or subacute infarction. No hemorrhage or mass lesion is present.  The ventricles are of normal size. No significant extraaxial fluid collection is present. Minimal white matter change scratch the minimal periventricular white matter change is evident. This is somewhat obscured by patient motion.  Flow is present in the major intracranial arteries. The globes and orbits are intact.  Mild diffuse mucosal thickening is  present throughout the paranasal sinuses, right greater than left. Minimal fluid is present in the mastoid air cells. No obstructing nasopharyngeal lesion is evident.  IMPRESSION: 1. No acute intracranial abnormality. No focal lesion to explain seizures. 2. Minimal white matter disease is somewhat obscured by patient motion.   Electronically Signed   By: Gennette Pac M.D.   On: 07/22/2014 09:53   US Carotid Bilateral  07/22/2014   CLINICAL DATA:  Stroke  EXAM: BILATERAL CAROTID DUPLEX ULTRASOUND  TECHNIQUE: Wallace Cullens scale imaging, color Doppler and duplex ultrasound were performed of bilateral carotid and vertebral arteries in the neck.  COMPARISON:  08/15/2007  FINDINGS: Criteria: Quantification of carotid stenosis is based on velocity parameters that correlate the residual internal carotid diameter with NASCET-based stenosis levels, using  the diameter of the distal internal carotid lumen as the denominator for stenosis measurement.  The following velocity measurements were obtained:  RIGHT  ICA:  66 cm/sec  CCA:  51 cm/sec  SYSTOLIC ICA/CCA RATIO:  1.3  DIASTOLIC ICA/CCA RATIO:  ECA:  96 cm/sec  LEFT  ICA:  65 cm/sec  CCA:  65 cm/sec  SYSTOLIC ICA/CCA RATIO:  1.0  DIASTOLIC ICA/CCA RATIO:  ECA:  76 cm/sec  RIGHT CAROTID ARTERY: Mild irregular plaque in the bulb  RIGHT VERTEBRAL ARTERY:  Antegrade.  LEFT CAROTID ARTERY:  Mild calcified plaque in the bulb.  LEFT VERTEBRAL ARTERY:  Antegrade.  IMPRESSION: Less than 50% stenosis in the right and left internal carotid arteries.   Electronically Signed   By: Maryclare Bean M.D.   On: 07/22/2014 16:31    Microbiology: No results found for this or any previous visit (from the past 240 hour(s)).   Labs: Basic Metabolic Panel:  Recent Labs Lab 07/21/14 1114 07/22/14 0606  NA 140 143  K 4.6 4.5  CL 104 106  CO2 26 27  GLUCOSE 149* 128*  BUN 13 11  CREATININE 0.84 0.86  CALCIUM 8.9 8.6   Liver Function Tests:  Recent Labs Lab 07/21/14 1114 07/22/14 0606   AST 82* 93*  ALT 77* 85*  ALKPHOS 95 95  BILITOT 0.6 0.4  PROT 7.0 7.0  ALBUMIN 3.9 3.7   No results found for this basename: LIPASE, AMYLASE,  in the last 168 hours No results found for this basename: AMMONIA,  in the last 168 hours CBC:  Recent Labs Lab 07/21/14 1114 07/22/14 0606  WBC 3.9* 4.5  NEUTROABS 1.8  --   HGB 14.0 14.0  HCT 42.1 43.6  MCV 90.3 93.4  PLT 168 187   Cardiac Enzymes:  Recent Labs Lab 07/21/14 1114  TROPONINI <0.30   BNP: BNP (last 3 results) No results found for this basename: PROBNP,  in the last 8760 hours CBG:  Recent Labs Lab 07/22/14 2054 07/23/14 0007 07/23/14 0428 07/23/14 0732 07/23/14 1130  GLUCAP 147* 158* 121* 123* 104*    Signed:  Donnelle Olmeda K  [facility] 07/23/2014, 2:04 PM

## 2014-07-23 NOTE — Discharge Instructions (Signed)
No driving until cleared by your PCP

## 2014-07-24 ENCOUNTER — Telehealth: Payer: Self-pay | Admitting: Adult Health

## 2014-07-24 NOTE — Telephone Encounter (Signed)
Spoke with pt. Pt is requesting a refill on Imipramine. JAG approved #[id number] on 06/17/14, and was advised to schedule an appt. I informed pt of this. Pt voiced understanding. Call was transferred to front desk for appt. JSY

## 2014-07-29 ENCOUNTER — Ambulatory Visit (INDEPENDENT_AMBULATORY_CARE_PROVIDER_SITE_OTHER): Payer: Commercial Managed Care - HMO | Admitting: Adult Health

## 2014-07-29 ENCOUNTER — Ambulatory Visit: Payer: Commercial Managed Care - HMO | Admitting: Adult Health

## 2014-07-29 ENCOUNTER — Encounter: Payer: Self-pay | Admitting: Adult Health

## 2014-07-29 VITALS — BP 118/84 | Ht 63.5 in | Wt 296.0 lb

## 2014-07-29 DIAGNOSIS — N318 Other neuromuscular dysfunction of bladder: Secondary | ICD-10-CM

## 2014-07-29 DIAGNOSIS — N3281 Overactive bladder: Secondary | ICD-10-CM

## 2014-07-29 MED ORDER — IMIPRAMINE HCL 50 MG PO TABS: ORAL_TABLET | ORAL | Status: DC

## 2014-07-29 NOTE — Patient Instructions (Signed)
Follow up in November for physical

## 2014-07-29 NOTE — Progress Notes (Signed)
Subjective:     Patient ID: [name], female   DOB: 30-Dec-1955, 58 y.o.   MRN: [id number]  HPI [name] is a 58 year old white female, married, in to get imipramine refilled, was recently in hospital for seizures.  Review of Systems See HPI Reviewed past medical,surgical, social and family history. Reviewed medications and allergies.     Objective:   Physical Exam BP 118/84  Ht 5' 3.5" (1.613 m)  Wt 296 lb (134.265 kg)  BMI 51.61 kg/m2   Talk only, does good if takes meds, is up a lot if does not take, has follow up with PCP soon for recent seizure.  Assessment:     OAB    Plan:     Refilled Imipramine 50 mg #30 1 at has with 6 refills Return in November for physical

## 2014-07-31 ENCOUNTER — Other Ambulatory Visit (HOSPITAL_COMMUNITY): Payer: Self-pay | Admitting: Internal Medicine

## 2014-07-31 ENCOUNTER — Ambulatory Visit (HOSPITAL_COMMUNITY)
Admission: RE | Admit: 2014-07-31 | Discharge: 2014-07-31 | Disposition: A | Discharge status: Home / Self Care | Payer: Medicare HMO | Source: Ambulatory Visit | Attending: Internal Medicine | Admitting: Internal Medicine

## 2014-07-31 DIAGNOSIS — W19XXXA Unspecified fall, initial encounter: Secondary | ICD-10-CM

## 2014-07-31 DIAGNOSIS — M25519 Pain in unspecified shoulder: Secondary | ICD-10-CM | POA: Insufficient documentation

## 2014-07-31 DIAGNOSIS — M25511 Pain in right shoulder: Secondary | ICD-10-CM

## 2014-07-31 DIAGNOSIS — S46909A Unspecified injury of unspecified muscle, fascia and tendon at shoulder and upper arm level, unspecified arm, initial encounter: Secondary | ICD-10-CM | POA: Diagnosis not present

## 2014-07-31 DIAGNOSIS — S4980XA Other specified injuries of shoulder and upper arm, unspecified arm, initial encounter: Secondary | ICD-10-CM | POA: Diagnosis not present

## 2014-08-22 ENCOUNTER — Ambulatory Visit: Payer: Commercial Managed Care - HMO | Admitting: Podiatrist

## 2014-10-14 ENCOUNTER — Encounter: Payer: Self-pay | Admitting: Adult Health

## 2014-10-30 ENCOUNTER — Ambulatory Visit: Payer: Commercial Managed Care - HMO | Admitting: Adult Health

## 2014-11-04 ENCOUNTER — Ambulatory Visit (INDEPENDENT_AMBULATORY_CARE_PROVIDER_SITE_OTHER): Payer: Commercial Managed Care - HMO | Admitting: Adult Health

## 2014-11-04 ENCOUNTER — Encounter: Payer: Self-pay | Admitting: Adult Health

## 2014-11-04 VITALS — BP 138/90 | Ht 64.0 in | Wt 288.0 lb

## 2014-11-04 DIAGNOSIS — Z01419 Encounter for gynecological examination (general) (routine) without abnormal findings: Secondary | ICD-10-CM

## 2014-11-04 DIAGNOSIS — R198 Other specified symptoms and signs involving the digestive system and abdomen: Secondary | ICD-10-CM

## 2014-11-04 DIAGNOSIS — Z1212 Encounter for screening for malignant neoplasm of rectum: Secondary | ICD-10-CM

## 2014-11-04 DIAGNOSIS — B369 Superficial mycosis, unspecified: Secondary | ICD-10-CM

## 2014-11-04 LAB — HEMOCCULT GUIAC POC 1CARD (OFFICE): Fecal Occult Blood, POC: NEGATIVE

## 2014-11-04 MED ORDER — NYSTATIN-TRIAMCINOLONE 100000-0.1 UNIT/GM-% EX OINT: 1.0000 "application " | TOPICAL_OINTMENT | Freq: Three times a day (TID) | CUTANEOUS | Status: DC

## 2014-11-04 NOTE — Progress Notes (Addendum)
Subjective:     Patient ID: [name], female   DOB: [birth year], 58 y.o.   MRN: [id number]  HPI [name] is a 58 year old white female in for pelvic exam, she is sp hysterectomy.Complains of skin fungus at navel still.Got flu shot.Had 1 seizure this year with negative W/U.  Review of Systems See HPI Reviewed past medical,surgical, social and family history. Reviewed medications and allergies.     Objective:   Physical Exam BP 138/90 mmHg  Ht 5\' 4"  (1.626 m)  Wt 288 lb (130.636 kg)  BMI 49.41 kg/m2   Skin warm and dry.Pelvic: external genitalia is normal in appearance for age, no leisons, vagina:atrophic, cervix and uterus are absent, adnexa: no masses, RLQ tenderness noted with some fullness, has skin fungus at navel.Rectal exam no polyps, hemoccult negative, +rectocele.   Assessment:     Pelvic exam  RLQ tenderness and fullness Skin fungus    Plan:     Rx mytrex cream tid prn Return in 1 week for GYN and see me Pelvic exam yearly

## 2014-11-04 NOTE — Patient Instructions (Signed)
Return in 1 week for [place] and see me Pelvic in 1 year

## 2014-11-10 ENCOUNTER — Encounter (HOSPITAL_COMMUNITY): Payer: Self-pay | Admitting: Cardiology

## 2014-11-10 ENCOUNTER — Emergency Department (HOSPITAL_COMMUNITY): Payer: Commercial Managed Care - HMO

## 2014-11-10 ENCOUNTER — Emergency Department (HOSPITAL_COMMUNITY)
Admission: EM | Admit: 2014-11-10 | Discharge: 2014-11-10 | Disposition: A | Discharge status: Home / Self Care | Payer: Commercial Managed Care - HMO | Attending: Emergency Medicine | Admitting: Emergency Medicine

## 2014-11-10 DIAGNOSIS — I10 Essential (primary) hypertension: Secondary | ICD-10-CM | POA: Diagnosis not present

## 2014-11-10 DIAGNOSIS — J45909 Unspecified asthma, uncomplicated: Secondary | ICD-10-CM | POA: Diagnosis not present

## 2014-11-10 DIAGNOSIS — M797 Fibromyalgia: Secondary | ICD-10-CM | POA: Diagnosis not present

## 2014-11-10 DIAGNOSIS — R079 Chest pain, unspecified: Secondary | ICD-10-CM

## 2014-11-10 DIAGNOSIS — Z9119 Patient's noncompliance with other medical treatment and regimen: Secondary | ICD-10-CM | POA: Diagnosis not present

## 2014-11-10 DIAGNOSIS — Z87891 Personal history of nicotine dependence: Secondary | ICD-10-CM | POA: Diagnosis not present

## 2014-11-10 DIAGNOSIS — F410 Panic disorder [episodic paroxysmal anxiety] without agoraphobia: Secondary | ICD-10-CM | POA: Insufficient documentation

## 2014-11-10 DIAGNOSIS — Z8719 Personal history of other diseases of the digestive system: Secondary | ICD-10-CM | POA: Insufficient documentation

## 2014-11-10 DIAGNOSIS — E119 Type 2 diabetes mellitus without complications: Secondary | ICD-10-CM | POA: Diagnosis not present

## 2014-11-10 DIAGNOSIS — R0789 Other chest pain: Secondary | ICD-10-CM | POA: Diagnosis not present

## 2014-11-10 DIAGNOSIS — Z9889 Other specified postprocedural states: Secondary | ICD-10-CM | POA: Diagnosis not present

## 2014-11-10 DIAGNOSIS — Z8742 Personal history of other diseases of the female genital tract: Secondary | ICD-10-CM | POA: Insufficient documentation

## 2014-11-10 DIAGNOSIS — Z79899 Other long term (current) drug therapy: Secondary | ICD-10-CM | POA: Insufficient documentation

## 2014-11-10 DIAGNOSIS — Z9981 Dependence on supplemental oxygen: Secondary | ICD-10-CM | POA: Insufficient documentation

## 2014-11-10 DIAGNOSIS — E780 Pure hypercholesterolemia: Secondary | ICD-10-CM | POA: Insufficient documentation

## 2014-11-10 DIAGNOSIS — G40909 Epilepsy, unspecified, not intractable, without status epilepticus: Secondary | ICD-10-CM | POA: Diagnosis not present

## 2014-11-10 DIAGNOSIS — G4733 Obstructive sleep apnea (adult) (pediatric): Secondary | ICD-10-CM | POA: Insufficient documentation

## 2014-11-10 LAB — CBC WITH DIFFERENTIAL/PLATELET
BASOS ABS: 0.1 10*3/uL (ref 0.0–0.1)
Basophils Relative: 2 % — ABNORMAL HIGH (ref 0–1)
Eosinophils Absolute: 0.1 10*3/uL (ref 0.0–0.7)
Eosinophils Relative: 3 % (ref 0–5)
HCT: 43.2 % (ref 36.0–46.0)
Hemoglobin: 14.5 g/dL (ref 12.0–15.0)
LYMPHS PCT: 37 % (ref 12–46)
Lymphs Abs: 1.7 10*3/uL (ref 0.7–4.0)
MCH: 30.1 pg (ref 26.0–34.0)
MCHC: 33.6 g/dL (ref 30.0–36.0)
MCV: 89.8 fL (ref 78.0–100.0)
Monocytes Absolute: 0.4 10*3/uL (ref 0.1–1.0)
Monocytes Relative: 8 % (ref 3–12)
Neutro Abs: 2.3 10*3/uL (ref 1.7–7.7)
Neutrophils Relative %: 50 % (ref 43–77)
Platelets: 208 10*3/uL (ref 150–400)
RBC: 4.81 MIL/uL (ref 3.87–5.11)
RDW: 12.8 % (ref 11.5–15.5)
WBC: 4.5 10*3/uL (ref 4.0–10.5)

## 2014-11-10 LAB — BASIC METABOLIC PANEL
ANION GAP: 13 (ref 5–15)
BUN: 13 mg/dL (ref 6–23)
CALCIUM: 9.5 mg/dL (ref 8.4–10.5)
CO2: 26 meq/L (ref 19–32)
CREATININE: 0.76 mg/dL (ref 0.50–1.10)
Chloride: 101 mEq/L (ref 96–112)
GFR calc Af Amer: >90 mL/min (ref >90)
GFR calc non Af Amer: >90 mL/min (ref >90)
GLUCOSE: 112 mg/dL — AB (ref 70–99)
Potassium: 4.6 mEq/L (ref 3.7–5.3)
Sodium: 140 mEq/L (ref 137–147)

## 2014-11-10 LAB — TROPONIN I
Troponin I: <0.3 ng/mL (ref <0.30)
Troponin I: <0.3 ng/mL (ref <0.30)

## 2014-11-10 MED ORDER — KETOROLAC TROMETHAMINE 30 MG/ML IJ SOLN
30.0000 mg | Freq: Once | INTRAMUSCULAR | Status: AC
  Administered 2014-11-10: 30 mg via INTRAVENOUS
  Filled 2014-11-10: qty 1

## 2014-11-10 MED ORDER — HYDROCODONE-ACETAMINOPHEN 5-325 MG PO TABS: 1.0000 | ORAL_TABLET | ORAL | Status: DC | PRN

## 2014-11-10 MED ORDER — MORPHINE SULFATE 4 MG/ML IJ SOLN
4.0000 mg | Freq: Once | INTRAMUSCULAR | Status: AC
  Administered 2014-11-10: 4 mg via INTRAVENOUS
  Filled 2014-11-10: qty 1

## 2014-11-10 NOTE — ED Notes (Signed)
Right shoulder pain that started several days ago.  Now c/o all over chest pain.  Chest tender to palpitation.

## 2014-11-10 NOTE — ED Provider Notes (Signed)
TIME SEEN: This chart was scribed for Chloe Maw Skyrah Krupp, [name] by Murriel Hopper, ED Scribe. This patient was seen in room APA06/APA06 and the patient's care was started at 6:47 PM.   CHIEF COMPLAINT: Chest Pain  HPI: HPI Comments: [name] is a 58 y.o. female with history of hypertension, hyperlipidemia, diabetes, fibromyalgia, panic attacks, obesity, reported history of seizures on Keppra who presents to the Emergency Department complaining of constant sharp chest pain with associated SOB and dizziness that began this morning. Pt describes the pain as "sharp and needling" and notes having similar symptoms in the past when she had a "heart attack a few years ago". She states that she has had a cardiac catheterization twice and has never had a stent. Pt states that her pain worsens with movement, palpation of her chest wall, movement of her arms. Pt states that a few days ago she had pain in her right shoulder as well for the past several days it is worse with movement of her arm.. Pt denies nausea, vomiting, or diarrhea, and denies Hx of PE or DVT. No recent surgery, fracture, hospitalization, long flight, exogenous hormone use.  She states she's had increased stress recently because her brother died several weeks ago.  No history of injury to the right shoulder trauma to the chest.    ROS: See HPI Constitutional: no fever  Eyes: no drainage  ENT: no runny nose   Cardiovascular:  chest pain  Resp: SOB  GI: no vomiting GU: no dysuria Integumentary: no rash  Allergy: no hives  Musculoskeletal: no leg swelling  Neurological: no slurred speech; dizziness ROS otherwise negative  PAST MEDICAL HISTORY/PAST SURGICAL HISTORY:  Past Medical History  Diagnosis Date  . Diabetes mellitus   . Hypertension   . Asthma   . Overactive bladder   . High cholesterol   . Fibromyalgia   . Panic attacks   . Obstructive sleep apnea     Noncompliant with CPAP due to claustophobia.  . H/O cardiac  catheterization     No significant CAD (only 20% LAD) 03/02/13 with normal LV function.  . Rectocele 05/21/2013  . OAB (overactive bladder) 05/21/2013  . Seizures     Had 1 with negative W/U 08/2014    MEDICATIONS:  Prior to Admission medications   Medication Sig Start Date End Date Taking? Authorizing Provider  atorvastatin (LIPITOR) 40 MG tablet Take 40 mg by mouth at bedtime.  10/15/14  Yes Historical Provider, MD  escitalopram (LEXAPRO) 20 MG tablet Take 20 mg by mouth daily.  03/09/14  Yes Historical Provider, MD  imipramine (TOFRANIL) 50 MG tablet TAKE (1) TABLET BY MOUTH AT BEDTIME. 07/29/14  Yes Adline Potter, NP  levETIRAcetam (KEPPRA) 500 MG tablet Take 1 tablet (500 mg total) by mouth 2 (two) times daily. 07/23/14  Yes Jerald Kief, MD  levothyroxine (SYNTHROID, LEVOTHROID) 50 MCG tablet Take 50 mcg by mouth daily before breakfast.  03/09/14  Yes Historical Provider, MD  lidocaine (XYLOCAINE) 5 % ointment Apply 1 application topically daily as needed for mild pain. Applied around stomach area 10/23/14  Yes Historical Provider, MD  metFORMIN (GLUCOPHAGE) 500 MG tablet Take 500 mg by mouth 2 (two) times daily with a meal.  03/26/14  Yes Historical Provider, MD  metoprolol (LOPRESSOR) 50 MG tablet Take 50 mg by mouth 2 (two) times daily.     Yes Historical Provider, MD  nystatin (MYCOSTATIN/NYSTOP) 100000 UNIT/GM POWD Apply 1 g topically 3 (three) times daily. 11/19/13  Yes Adline Potter, NP  nystatin-triamcinolone ointment (MYCOLOG) Apply 1 application topically 3 (three) times daily. 11/04/14  Yes Adline Potter, NP  PROAIR HFA 108 (90 BASE) MCG/ACT inhaler Inhale 2 puffs into the lungs every 4 (four) hours as needed. For shortness of breath 10/01/14  Yes Historical Provider, MD  TOVIAZ 8 MG TB24 tablet TAKE ONE TABLET BY MOUTH ONCE DAILY. 03/09/14  Yes Adline Potter, NP    ALLERGIES:  Allergies  Allergen Reactions  . Aspirin Nausea And Vomiting  . Codeine Rash     SOCIAL HISTORY:  History  Substance Use Topics  . Smoking status: Former Smoker -- 1 years    Types: Cigarettes  . Smokeless tobacco: Never Used  . Alcohol Use: No    FAMILY HISTORY: Family History  Problem Relation Age of Onset  . Heart attack Mother   . Diabetes Mother   . Hypertension Mother   . Sick sinus syndrome Brother     Pacemaker  . Congenital heart disease Brother   . Stroke Brother   . Coronary artery disease Brother   . Cancer Maternal Aunt     breast, liver,lung  . Cancer Cousin     leukemia    EXAM: BP 133/73 mmHg  Pulse 76  Temp(Src) 97.6 F (36.4 C) (Oral)  Resp 22  Ht 5\' 4"  (1.626 m)  Wt 288 lb (130.636 kg)  BMI 49.41 kg/m2  SpO2 100% CONSTITUTIONAL: Alert and oriented and responds appropriately to questions. Well-appearing; well-nourished; tearful, morbidly obese HEAD: Normocephalic EYES: Conjunctivae clear, PERRL ENT: normal nose; no rhinorrhea; moist mucous membranes; pharynx without lesions noted NECK: Supple, no meningismus, no LAD  CARD: RRR; S1 and S2 appreciated; no murmurs, no clicks, no rubs, no gallops; tender diffusely over anterior chest wall that reproduces her pain; no crepitus or deformity  RESP: Normal chest excursion without splinting or tachypnea; breath sounds clear and equal bilaterally; no wheezes, no rhonchi, no rales, no hypoxia or respiratory distress ABD/GI: Normal bowel sounds; non-distended; soft, non-tender, no rebound, no guarding BACK:  The back appears normal and is non-tender to palpation, there is no CVA tenderness EXT: Normal ROM in all joints; non-tender to palpation; no edema; normal capillary refill; no cyanosis    SKIN: Normal color for age and race; warm NEURO: Moves all extremities equally PSYCH: The patient's mood and manner are appropriate. Grooming and personal hygiene are appropriate.  MEDICAL DECISION MAKING: Patient here with very atypical chest pain. According to her records she has had a cardiac  catheterization 03/02/2013 which showed 20% ostial stenosis but no other coronary artery disease. EF was 55-65%. I feel that this is most likely chest wall pain although she does have risk factors for ACS. Her EKG is nonischemic. Her labs are unremarkable including a negative troponin. Chest x-ray clear. We'll give Toradol, morphine and reassess. Will repeat second troponin. If negative, will discharge home with close outpatient follow-up. Patient comfortable with this plan.  ED PROGRESS: Patient's second troponin is negative. I feel she is safe to be discharged home. We'll discharge with prescription for Vicodin for her chest wall pain. Discussed return precautions. Discussed importance of outpatient follow-up. She verbalized understanding and is comfortable with plan.      EKG Interpretation  Date/Time:  Sunday November 10 2014 18:27:22 EST Ventricular Rate:  79 PR Interval:  160 QRS Duration: 78 QT Interval:  390 QTC Calculation: 447 R Axis:   -29 Text Interpretation:  Normal sinus rhythm Low voltage QRS  Borderline ECG No significant change since last tracing Confirmed by Erik Nessel,  [name], Karell Tukes [phone]) on 11/10/2014 7:31:22 PM         I personally performed the services described in this documentation, which was scribed in my presence. The recorded information has been reviewed and is accurate.    Chloe Maw Joslynne Klatt, [name] 11/10/14 2238

## 2014-11-10 NOTE — Discharge Instructions (Signed)
Chest Wall Pain Chest wall pain is pain in or around the bones and muscles of your chest. It may take up to 6 weeks to get better. It may take longer if you must stay physically active in your work and activities.  CAUSES  Chest wall pain may happen on its own. However, it may be caused by:  A viral illness like the flu.  Injury.  Coughing.  Exercise.  Arthritis.  Fibromyalgia.  Shingles. HOME CARE INSTRUCTIONS   Avoid overtiring physical activity. Try not to strain or perform activities that cause pain. This includes any activities using your chest or your abdominal and side muscles, especially if heavy weights are used.  Put ice on the sore area.  Put ice in a plastic bag.  Place a towel between your skin and the bag.  Leave the ice on for 15-20 minutes per hour while awake for the first 2 days.  Only take over-the-counter or prescription medicines for pain, discomfort, or fever as directed by your caregiver. SEEK IMMEDIATE MEDICAL CARE IF:   Your pain increases, or you are very uncomfortable.  You have a fever.  Your chest pain becomes worse.  You have new, unexplained symptoms.  You have nausea or vomiting.  You feel sweaty or lightheaded.  You have a cough with phlegm (sputum), or you cough up blood. MAKE SURE YOU:   Understand these instructions.  Will watch your condition.  Will get help right away if you are not doing well or get worse. Document Released: 11/29/2005 Document Revised: 02/21/2012 Document Reviewed: 07/26/2011 [facility] Patient Information 2015 [place], [place]. This information is not intended to replace advice given to you by your health care provider. Make sure you discuss any questions you have with your health care provider.  Chest Pain Observation It is often hard to give a specific diagnosis for the cause of chest pain. Among other possibilities your symptoms might be caused by inadequate oxygen delivery to your heart (angina).  Angina that is not treated or evaluated can lead to a heart attack (myocardial infarction) or death. Blood tests, electrocardiograms, and X-rays may have been done to help determine a possible cause of your chest pain. After evaluation and observation, your health care provider has determined that it is unlikely your pain was caused by an unstable condition that requires hospitalization. However, a full evaluation of your pain may need to be completed, with additional diagnostic testing as directed. It is very important to keep your follow-up appointments. Not keeping your follow-up appointments could result in permanent heart damage, disability, or death. If there is any problem keeping your follow-up appointments, you must call your health care provider. HOME CARE INSTRUCTIONS  Due to the slight chance that your pain could be angina, it is important to follow your health care provider's treatment plan and also maintain a healthy lifestyle:  Maintain or work toward achieving a healthy weight.  Stay physically active and exercise regularly.  Decrease your salt intake.  Eat a balanced, healthy diet. Talk to a dietitian to learn about heart-healthy foods.  Increase your fiber intake by including whole grains, vegetables, fruits, and nuts in your diet.  Avoid situations that cause stress, anger, or depression.  Take medicines as advised by your health care provider. Report any side effects to your health care provider. Do not stop medicines or adjust the dosages on your own.  Quit smoking. Do not use nicotine patches or gum until you check with your health care provider.  Keep your blood pressure, blood sugar, and cholesterol levels within normal limits.  Limit alcohol intake to no more than 1 drink per day for women who are not pregnant and 2 drinks per day for men.  Do not abuse drugs. SEEK IMMEDIATE MEDICAL CARE IF: You have severe chest pain or pressure which may include symptoms such  as:  You feel pain or pressure in your arms, neck, jaw, or back.  You have severe back or abdominal pain, feel sick to your stomach (nauseous), or throw up (vomit).  You are sweating profusely.  You are having a fast or irregular heartbeat.  You feel short of breath while at rest.  You notice increasing shortness of breath during rest, sleep, or with activity.  You have chest pain that does not get better after rest or after taking your usual medicine.  You wake from sleep with chest pain.  You are unable to sleep because you cannot breathe.  You develop a frequent cough or you are coughing up blood.  You feel dizzy, faint, or experience extreme fatigue.  You develop severe weakness, dizziness, fainting, or chills. Any of these symptoms may represent a serious problem that is an emergency. Do not wait to see if the symptoms will go away. Call your local emergency services (911 in the [place] Do not drive yourself to the hospital. MAKE SURE YOU:  Understand these instructions.  Will watch your condition.  Will get help right away if you are not doing well or get worse. Document Released: 01/01/2011 Document Revised: 12/04/2013 Document Reviewed: 05/31/2013 [facility] Patient Information 2015 [place], [place]. This information is not intended to replace advice given to you by your health care provider. Make sure you discuss any questions you have with your health care provider.

## 2014-11-12 ENCOUNTER — Encounter: Payer: Self-pay | Admitting: Adult Health

## 2014-11-12 ENCOUNTER — Ambulatory Visit (INDEPENDENT_AMBULATORY_CARE_PROVIDER_SITE_OTHER): Payer: Commercial Managed Care - HMO | Admitting: Adult Health

## 2014-11-12 ENCOUNTER — Ambulatory Visit (INDEPENDENT_AMBULATORY_CARE_PROVIDER_SITE_OTHER): Payer: Commercial Managed Care - HMO

## 2014-11-12 VITALS — BP 128/90 | Ht 64.0 in | Wt 287.0 lb

## 2014-11-12 DIAGNOSIS — N949 Unspecified condition associated with female genital organs and menstrual cycle: Secondary | ICD-10-CM

## 2014-11-12 DIAGNOSIS — R198 Other specified symptoms and signs involving the digestive system and abdomen: Secondary | ICD-10-CM

## 2014-11-12 DIAGNOSIS — R102 Pelvic and perineal pain: Secondary | ICD-10-CM

## 2014-11-12 NOTE — Patient Instructions (Signed)
Follow up prn

## 2014-11-12 NOTE — Progress Notes (Signed)
Subjective:     Patient ID: [name], female   DOB: 28-Jun-1956, 58 y.o.   MRN: [id number]  HPI  Niema is a 58 year old white female in for [place] had pelvic tenderness and some RLQ fullness.She says she aches all over from fibromyalgia.  Review of Systems See HPI Reviewed past medical,surgical, social and family history. Reviewed medications and allergies.     Objective:   Physical Exam BP 128/90 mmHg  Ht 5\' 4"  (1.626 m)  Wt 287 lb (130.182 kg)  BMI 49.24 kg/m2 reviewed with pt. Uterus Surgically Absent Vaginal Cuff appears WNL  Right ovary Unable to identify ovarian tissue although no obvious mass noted within the adnexa, Pt Very Tender during entire exam  Left ovary Unable to identify ovarian tissue although no obvious mass noted within the adnexa, Pt Very Tender during entire exam  No free fluid or adnexal masses noted within the pelvis  Technician Comments:  Vaginal cuff appears WNL, no free fluid or adnexal masses noted within the pelvis, Pt very tender during exam, no obvious ovarian tissue identified      Assessment:     Pelvic tenderness, no gyn etiology     Plan:     Follow up prn

## 2014-11-21 ENCOUNTER — Encounter (HOSPITAL_COMMUNITY): Payer: Self-pay | Admitting: Cardiology

## 2014-11-28 ENCOUNTER — Other Ambulatory Visit: Payer: Self-pay | Admitting: Adult Health

## 2015-01-10 ENCOUNTER — Emergency Department (HOSPITAL_COMMUNITY)
Admission: EM | Admit: 2015-01-10 | Discharge: 2015-01-10 | Disposition: A | Discharge status: Home / Self Care | Payer: Commercial Managed Care - HMO | Attending: Emergency Medicine | Admitting: Emergency Medicine

## 2015-01-10 ENCOUNTER — Emergency Department (HOSPITAL_COMMUNITY): Payer: Commercial Managed Care - HMO

## 2015-01-10 ENCOUNTER — Encounter (HOSPITAL_COMMUNITY): Payer: Self-pay | Admitting: *Deleted

## 2015-01-10 DIAGNOSIS — R079 Chest pain, unspecified: Secondary | ICD-10-CM | POA: Diagnosis present

## 2015-01-10 DIAGNOSIS — G4733 Obstructive sleep apnea (adult) (pediatric): Secondary | ICD-10-CM | POA: Insufficient documentation

## 2015-01-10 DIAGNOSIS — M797 Fibromyalgia: Secondary | ICD-10-CM | POA: Diagnosis not present

## 2015-01-10 DIAGNOSIS — E780 Pure hypercholesterolemia: Secondary | ICD-10-CM | POA: Diagnosis not present

## 2015-01-10 DIAGNOSIS — I10 Essential (primary) hypertension: Secondary | ICD-10-CM | POA: Insufficient documentation

## 2015-01-10 DIAGNOSIS — N3281 Overactive bladder: Secondary | ICD-10-CM | POA: Diagnosis not present

## 2015-01-10 DIAGNOSIS — F410 Panic disorder [episodic paroxysmal anxiety] without agoraphobia: Secondary | ICD-10-CM | POA: Diagnosis not present

## 2015-01-10 DIAGNOSIS — J45909 Unspecified asthma, uncomplicated: Secondary | ICD-10-CM | POA: Diagnosis not present

## 2015-01-10 DIAGNOSIS — Z87891 Personal history of nicotine dependence: Secondary | ICD-10-CM | POA: Insufficient documentation

## 2015-01-10 DIAGNOSIS — E119 Type 2 diabetes mellitus without complications: Secondary | ICD-10-CM | POA: Diagnosis not present

## 2015-01-10 LAB — COMPREHENSIVE METABOLIC PANEL
ALT: 69 U/L — ABNORMAL HIGH (ref 0–35)
AST: 60 U/L — ABNORMAL HIGH (ref 0–37)
Albumin: 4.1 g/dL (ref 3.5–5.2)
Alkaline Phosphatase: 81 U/L (ref 39–117)
Anion gap: 6 (ref 5–15)
BUN: 12 mg/dL (ref 6–23)
CO2: 28 mmol/L (ref 19–32)
CREATININE: 0.86 mg/dL (ref 0.50–1.10)
Calcium: 8.9 mg/dL (ref 8.4–10.5)
Chloride: 104 mmol/L (ref 96–112)
GFR calc Af Amer: 84 mL/min — ABNORMAL LOW (ref >90)
GFR calc non Af Amer: 73 mL/min — ABNORMAL LOW (ref >90)
Glucose, Bld: 106 mg/dL — ABNORMAL HIGH (ref 70–99)
Potassium: 4.2 mmol/L (ref 3.5–5.1)
Sodium: 138 mmol/L (ref 135–145)
TOTAL PROTEIN: 7.1 g/dL (ref 6.0–8.3)
Total Bilirubin: 0.8 mg/dL (ref 0.3–1.2)

## 2015-01-10 LAB — TROPONIN I: Troponin I: <0.03 ng/mL (ref <0.031)

## 2015-01-10 LAB — CBC WITH DIFFERENTIAL/PLATELET
BASOS ABS: 0 10*3/uL (ref 0.0–0.1)
Basophils Relative: 0 % (ref 0–1)
EOS ABS: 0.1 10*3/uL (ref 0.0–0.7)
Eosinophils Relative: 2 % (ref 0–5)
HEMATOCRIT: 45.4 % (ref 36.0–46.0)
Hemoglobin: 14.7 g/dL (ref 12.0–15.0)
Lymphocytes Relative: 20 % (ref 12–46)
Lymphs Abs: 1.4 10*3/uL (ref 0.7–4.0)
MCH: 29.7 pg (ref 26.0–34.0)
MCHC: 32.4 g/dL (ref 30.0–36.0)
MCV: 91.7 fL (ref 78.0–100.0)
Monocytes Absolute: 0.5 10*3/uL (ref 0.1–1.0)
Monocytes Relative: 6 % (ref 3–12)
NEUTROS PCT: 72 % (ref 43–77)
Neutro Abs: 5.1 10*3/uL (ref 1.7–7.7)
Platelets: 198 10*3/uL (ref 150–400)
RBC: 4.95 MIL/uL (ref 3.87–5.11)
RDW: 12.6 % (ref 11.5–15.5)
WBC: 7.2 10*3/uL (ref 4.0–10.5)

## 2015-01-10 MED ORDER — HYDROMORPHONE HCL 4 MG PO TABS: 4.0000 mg | ORAL_TABLET | Freq: Four times a day (QID) | ORAL | Status: DC | PRN

## 2015-01-10 MED ORDER — SODIUM CHLORIDE 0.9 % IV SOLN
INTRAVENOUS | Status: DC
  Administered 2015-01-10: 20:00:00 via INTRAVENOUS

## 2015-01-10 MED ORDER — ONDANSETRON HCL 4 MG/2ML IJ SOLN
4.0000 mg | Freq: Once | INTRAMUSCULAR | Status: AC
  Administered 2015-01-10: 4 mg via INTRAVENOUS
  Filled 2015-01-10: qty 2

## 2015-01-10 MED ORDER — MORPHINE SULFATE 2 MG/ML IJ SOLN
2.0000 mg | Freq: Once | INTRAMUSCULAR | Status: AC
  Administered 2015-01-10: 2 mg via INTRAVENOUS
  Filled 2015-01-10: qty 1

## 2015-01-10 MED ORDER — SODIUM CHLORIDE 0.9 % IV BOLUS (SEPSIS)
250.0000 mL | Freq: Once | INTRAVENOUS | Status: AC
  Administered 2015-01-10: 250 mL via INTRAVENOUS

## 2015-01-10 NOTE — ED Provider Notes (Signed)
CSN: [id number]     Arrival date & time 01/10/15  1609 History   First MD Initiated Contact with Patient 01/10/15 1721     Chief Complaint  Patient presents with  . Chest Pain     (Consider location/radiation/quality/duration/timing/severity/associated sxs/prior Treatment) Patient is a 59 y.o. female presenting with chest pain. The history is provided by the patient.  Chest Pain Associated symptoms: headache and shortness of breath   Associated symptoms: no abdominal pain, no back pain, no fever, no nausea and not vomiting    patient followed by Dr. Dwana Melena, patient status post cardiac cath in 2014 without any significant coronary artery disease. It was in March 2014. Patient has been evaluated for chest pain before without cardiac findings. Patient with onset of left-sided chest pain that went to the left arm started at 3:00 in the afternoon. Pain is an ache and sharp at times made worse by movement of the arm and movement of the chest. Associated with some shortness of breath but no significant hypoxia. Patient also said a mild headache for the past 2 days. No nausea no vomiting. Patient states that the pain is 9 out of 10. Patient has an allergy to aspirin.  Past Medical History  Diagnosis Date  . Diabetes mellitus   . Hypertension   . Asthma   . Overactive bladder   . High cholesterol   . Fibromyalgia   . Panic attacks   . Obstructive sleep apnea     Noncompliant with CPAP due to claustophobia.  . H/O cardiac catheterization     No significant CAD (only 20% LAD) 03/02/13 with normal LV function.  . Rectocele 05/21/2013  . OAB (overactive bladder) 05/21/2013  . Seizures     Had 1 with negative W/U 08/2014   Past Surgical History  Procedure Laterality Date  . Abdominal hysterectomy    . Knee replacements      bilateral  . Back surgery    . Tonsillectomy    . Carpal tunnel release    . Ankle surgery    . Tubal ligation    . Left heart catheterization with coronary angiogram  N/A 03/02/2013    Procedure: LEFT HEART CATHETERIZATION WITH CORONARY ANGIOGRAM;  Surgeon: Peter M [place], MD;  Location: [facility] CATH LAB;  Service: Cardiovascular;  Laterality: N/A;  . Cardiac catheterization  02/2013    no significant coronary artery disease   Family History  Problem Relation Age of Onset  . Heart attack Mother   . Diabetes Mother   . Hypertension Mother   . Sick sinus syndrome Brother     Pacemaker  . Congenital heart disease Brother   . Stroke Brother   . Coronary artery disease Brother   . Cancer Maternal Aunt     breast, liver,lung  . Cancer Cousin     leukemia   History  Substance Use Topics  . Smoking status: Former Smoker -- 1 years    Types: Cigarettes  . Smokeless tobacco: Never Used  . Alcohol Use: No   OB History    Gravida Para Term Preterm AB TAB SAB Ectopic Multiple Living   2 2             Review of Systems  Constitutional: Negative for fever.  HENT: Negative for congestion.   Eyes: Negative for redness and visual disturbance.  Respiratory: Positive for shortness of breath.   Cardiovascular: Positive for chest pain.  Gastrointestinal: Negative for nausea, vomiting and abdominal pain.  Genitourinary:  Negative for dysuria.  Musculoskeletal: Negative for back pain.  Skin: Negative for rash.  Neurological: Positive for headaches.  Hematological: Does not bruise/bleed easily.  Psychiatric/Behavioral: Negative for confusion.      Allergies  Aspirin and Codeine  Home Medications   Prior to Admission medications   Medication Sig Start Date End Date Taking? Authorizing Provider  atorvastatin (LIPITOR) 40 MG tablet Take 40 mg by mouth at bedtime.  10/15/14  Yes Historical Provider, MD  escitalopram (LEXAPRO) 20 MG tablet Take 20 mg by mouth daily.  03/09/14  Yes Historical Provider, MD  imipramine (TOFRANIL) 50 MG tablet TAKE (1) TABLET BY MOUTH AT BEDTIME. 07/29/14  Yes Adline Potter, NP  levETIRAcetam (KEPPRA) 500 MG tablet Take 1  tablet (500 mg total) by mouth 2 (two) times daily. 07/23/14  Yes Jerald Kief, MD  levothyroxine (SYNTHROID, LEVOTHROID) 50 MCG tablet Take 50 mcg by mouth daily before breakfast.  03/09/14  Yes Historical Provider, MD  metFORMIN (GLUCOPHAGE) 500 MG tablet Take 500 mg by mouth 2 (two) times daily with a meal.  03/26/14  Yes Historical Provider, MD  metoprolol (LOPRESSOR) 50 MG tablet Take 50 mg by mouth 2 (two) times daily.     Yes Historical Provider, MD  nystatin (MYCOSTATIN/NYSTOP) 100000 UNIT/GM POWD Apply 1 g topically 3 (three) times daily. 11/19/13  Yes Adline Potter, NP  nystatin-triamcinolone ointment (MYCOLOG) Apply 1 application topically 3 (three) times daily. 11/04/14  Yes Adline Potter, NP  PROAIR HFA 108 (90 BASE) MCG/ACT inhaler Inhale 2 puffs into the lungs every 4 (four) hours as needed. For shortness of breath 10/01/14  Yes Historical Provider, MD  TOVIAZ 8 MG TB24 tablet TAKE ONE TABLET BY MOUTH ONCE DAILY. 11/28/14  Yes Adline Potter, NP  HYDROmorphone (DILAUDID) 4 MG tablet Take 1 tablet (4 mg total) by mouth every 6 (six) hours as needed for severe pain. 01/10/15   Vanetta Mulders, MD   BP 124/87 mmHg  Pulse 78  Temp(Src) 97.9 F (36.6 C) (Oral)  Resp 17  Ht 5\' 4"  (1.626 m)  Wt 283 lb (128.368 kg)  BMI 48.55 kg/m2  SpO2 94% Physical Exam  Constitutional: She is oriented to person, place, and time. She appears well-developed and well-nourished. No distress.  HENT:  Head: Normocephalic and atraumatic.  Mouth/Throat: Oropharynx is clear and moist.  Eyes: Conjunctivae and EOM are normal. Pupils are equal, round, and reactive to light.  Neck: Normal range of motion. Neck supple.  Cardiovascular: Normal rate, regular rhythm and normal heart sounds.   Pulmonary/Chest: Effort normal and breath sounds normal. No respiratory distress.  Abdominal: Soft. Bowel sounds are normal. There is no tenderness.  Musculoskeletal: Normal range of motion. She exhibits no  edema.  Neurological: She is alert and oriented to person, place, and time. No cranial nerve deficit. She exhibits normal muscle tone. Coordination normal.  Skin: Skin is warm. No rash noted.  Nursing note and vitals reviewed.   ED Course  Procedures (including critical care time) Labs Review Labs Reviewed  COMPREHENSIVE METABOLIC PANEL - Abnormal; Notable for the following:    Glucose, Bld 106 (*)    AST 60 (*)    ALT 69 (*)    GFR calc non Af Amer 73 (*)    GFR calc Af Amer 84 (*)    All other components within normal limits  CBC WITH DIFFERENTIAL/PLATELET  TROPONIN I  TROPONIN I   Results for orders placed or performed during the hospital  encounter of 01/10/15  CBC with Differential  Result Value Ref Range   WBC 7.2 4.0 - 10.5 K/uL   RBC 4.95 3.87 - 5.11 MIL/uL   Hemoglobin 14.7 12.0 - 15.0 g/dL   HCT 32.3 55.7 - 32.2 %   MCV 91.7 78.0 - 100.0 fL   MCH 29.7 26.0 - 34.0 pg   MCHC 32.4 30.0 - 36.0 g/dL   RDW 02.5 42.7 - 06.2 %   Platelets 198 150 - 400 K/uL   Neutrophils Relative % 72 43 - 77 %   Neutro Abs 5.1 1.7 - 7.7 K/uL   Lymphocytes Relative 20 12 - 46 %   Lymphs Abs 1.4 0.7 - 4.0 K/uL   Monocytes Relative 6 3 - 12 %   Monocytes Absolute 0.5 0.1 - 1.0 K/uL   Eosinophils Relative 2 0 - 5 %   Eosinophils Absolute 0.1 0.0 - 0.7 K/uL   Basophils Relative 0 0 - 1 %   Basophils Absolute 0.0 0.0 - 0.1 K/uL  Comprehensive metabolic panel  Result Value Ref Range   Sodium 138 135 - 145 mmol/L   Potassium 4.2 3.5 - 5.1 mmol/L   Chloride 104 96 - 112 mmol/L   CO2 28 19 - 32 mmol/L   Glucose, Bld 106 (H) 70 - 99 mg/dL   BUN 12 6 - 23 mg/dL   Creatinine, Ser 3.76 0.50 - 1.10 mg/dL   Calcium 8.9 8.4 - 28.3 mg/dL   Total Protein 7.1 6.0 - 8.3 g/dL   Albumin 4.1 3.5 - 5.2 g/dL   AST 60 (H) 0 - 37 U/L   ALT 69 (H) 0 - 35 U/L   Alkaline Phosphatase 81 39 - 117 U/L   Total Bilirubin 0.8 0.3 - 1.2 mg/dL   GFR calc non Af Amer 73 (L) >90 mL/min   GFR calc Af Amer 84 (L)  >90 mL/min   Anion gap 6 5 - 15  Troponin I  Result Value Ref Range   Troponin I <0.03 <0.031 ng/mL  Troponin I  Result Value Ref Range   Troponin I <0.03 <0.031 ng/mL     Imaging Review Dg Chest Portable 1 View  01/10/2015   CLINICAL DATA:  Left-sided chest pain.  Left arm numbness.  EXAM: PORTABLE CHEST - 1 VIEW  COMPARISON:  01/10/2014  FINDINGS: Lungs are clear without airspace disease or edema. Heart and mediastinum are within normal limits. The trachea is midline. Negative for a pneumothorax. No acute bone abnormality.  IMPRESSION: No acute chest findings.   Electronically Signed   By: Richarda Overlie M.D.   On: 01/10/2015 17:13     EKG Interpretation   Date/Time:  Friday January 10 2015 16:19:21 EST Ventricular Rate:  81 PR Interval:  166 QRS Duration: 87 QT Interval:  375 QTC Calculation: 435 R Axis:   -29 Text Interpretation:  Sinus rhythm Borderline left axis deviation Low  voltage, extremity and precordial leads Artifact No significant change  since last tracing Confirmed by Jovane Foutz  MD, Emanuel Dowson [phone]) on 01/10/2015  7:07:28 PM      MDM   Final diagnoses:  Chest pain, unspecified chest pain type    Patient status post cardiac cath in March 2014 with a no significant disease. Patient with onset of chest pain around 3:00 in the afternoon been constant left chest area made worse by movement. Suggestive of musculoskeletal pain. Does move into the left arm. Troponins 2 were negative.  Patient and patient's husband states that  when she gets worked up she gets pain like this. Patient improved here with pain medication. Patient has follow-up with her doctor in the morning.  EKG without acute changes. Troponins 2 negative. Chest x-ray negative for any pneumonia pulmonary edema or pneumothorax. Suspect noncardiac chest pain.    Vanetta Mulders, MD 01/10/15 2109

## 2015-01-10 NOTE — ED Notes (Signed)
Patient verbalizes understanding of discharge instructions, prescription medications, home care and follow up care. Patient ambulatory out of department at this time with family. 

## 2015-01-10 NOTE — Discharge Instructions (Signed)
Workup for the chest pain without any significant findings. Return for any new or worse symptoms. Keep your appointment with Dr. [name] as scheduled for the morning. Take pain medicine as needed.

## 2015-01-10 NOTE — ED Notes (Addendum)
Left-sided CP with left arm numbness PTA. Pain is constant and described as heavy pressure. Pt states she has had a headache x 2 days.

## 2015-01-19 ENCOUNTER — Encounter (HOSPITAL_COMMUNITY): Payer: Self-pay | Admitting: Emergency Medicine

## 2015-01-19 ENCOUNTER — Emergency Department (HOSPITAL_COMMUNITY): Payer: Commercial Managed Care - HMO

## 2015-01-19 ENCOUNTER — Emergency Department (HOSPITAL_COMMUNITY)
Admission: EM | Admit: 2015-01-19 | Discharge: 2015-01-19 | Disposition: A | Discharge status: Home / Self Care | Payer: Commercial Managed Care - HMO | Attending: Emergency Medicine | Admitting: Emergency Medicine

## 2015-01-19 DIAGNOSIS — Z87891 Personal history of nicotine dependence: Secondary | ICD-10-CM | POA: Insufficient documentation

## 2015-01-19 DIAGNOSIS — J45909 Unspecified asthma, uncomplicated: Secondary | ICD-10-CM | POA: Insufficient documentation

## 2015-01-19 DIAGNOSIS — Z9889 Other specified postprocedural states: Secondary | ICD-10-CM | POA: Insufficient documentation

## 2015-01-19 DIAGNOSIS — G40909 Epilepsy, unspecified, not intractable, without status epilepticus: Secondary | ICD-10-CM | POA: Insufficient documentation

## 2015-01-19 DIAGNOSIS — E780 Pure hypercholesterolemia: Secondary | ICD-10-CM | POA: Insufficient documentation

## 2015-01-19 DIAGNOSIS — G8929 Other chronic pain: Secondary | ICD-10-CM | POA: Insufficient documentation

## 2015-01-19 DIAGNOSIS — F410 Panic disorder [episodic paroxysmal anxiety] without agoraphobia: Secondary | ICD-10-CM | POA: Insufficient documentation

## 2015-01-19 DIAGNOSIS — N390 Urinary tract infection, site not specified: Secondary | ICD-10-CM

## 2015-01-19 DIAGNOSIS — E119 Type 2 diabetes mellitus without complications: Secondary | ICD-10-CM | POA: Insufficient documentation

## 2015-01-19 DIAGNOSIS — G4733 Obstructive sleep apnea (adult) (pediatric): Secondary | ICD-10-CM | POA: Insufficient documentation

## 2015-01-19 DIAGNOSIS — R109 Unspecified abdominal pain: Secondary | ICD-10-CM | POA: Diagnosis present

## 2015-01-19 DIAGNOSIS — I10 Essential (primary) hypertension: Secondary | ICD-10-CM | POA: Insufficient documentation

## 2015-01-19 DIAGNOSIS — M797 Fibromyalgia: Secondary | ICD-10-CM | POA: Insufficient documentation

## 2015-01-19 DIAGNOSIS — Z79899 Other long term (current) drug therapy: Secondary | ICD-10-CM | POA: Insufficient documentation

## 2015-01-19 HISTORY — DX: Headache: R51

## 2015-01-19 HISTORY — DX: Headache, unspecified: R51.9

## 2015-01-19 HISTORY — DX: Dorsalgia, unspecified: M54.9

## 2015-01-19 HISTORY — DX: Other chronic pain: G89.29

## 2015-01-19 HISTORY — DX: Pelvic and perineal pain: R10.2

## 2015-01-19 HISTORY — DX: Pain in leg, unspecified: M79.606

## 2015-01-19 LAB — URINALYSIS, ROUTINE W REFLEX MICROSCOPIC
Bilirubin Urine: NEGATIVE
Glucose, UA: NEGATIVE mg/dL
KETONES UR: NEGATIVE mg/dL
Nitrite: NEGATIVE
PH: 6.5 (ref 5.0–8.0)
PROTEIN: NEGATIVE mg/dL
SPECIFIC GRAVITY, URINE: 1.02 (ref 1.005–1.030)
UROBILINOGEN UA: 0.2 mg/dL (ref 0.0–1.0)

## 2015-01-19 LAB — URINE MICROSCOPIC-ADD ON

## 2015-01-19 LAB — CBG MONITORING, ED: Glucose-Capillary: 81 mg/dL (ref 70–99)

## 2015-01-19 MED ORDER — LIDOCAINE HCL (PF) 1 % IJ SOLN
INTRAMUSCULAR | Status: AC
  Administered 2015-01-19: 16:00:00
  Filled 2015-01-19: qty 5

## 2015-01-19 MED ORDER — CEPHALEXIN 500 MG PO CAPS: 500.0000 mg | ORAL_CAPSULE | Freq: Four times a day (QID) | ORAL | Status: DC

## 2015-01-19 MED ORDER — OXYCODONE-ACETAMINOPHEN 5-325 MG PO TABS: ORAL_TABLET | ORAL | Status: DC

## 2015-01-19 MED ORDER — OXYCODONE-ACETAMINOPHEN 5-325 MG PO TABS
2.0000 | ORAL_TABLET | Freq: Once | ORAL | Status: AC
  Administered 2015-01-19: 2 via ORAL
  Filled 2015-01-19: qty 2

## 2015-01-19 MED ORDER — CEFTRIAXONE SODIUM 1 G IJ SOLR
1.0000 g | Freq: Once | INTRAMUSCULAR | Status: AC
  Administered 2015-01-19: 1 g via INTRAMUSCULAR
  Filled 2015-01-19: qty 10

## 2015-01-19 NOTE — ED Notes (Signed)
Patient c/o right flank pain. Per patient unable to void. Patient states burning sensation when she "dribles a little bit of urine." Per patient was last able to void at 11pm yesterday. Denies any blood noted in urine.

## 2015-01-19 NOTE — Discharge Instructions (Signed)
°Emergency Department Resource Guide °1) Find a Doctor and Pay Out of Pocket °Although you won't have to find out who is covered by your insurance plan, it is a good idea to ask around and get recommendations. You will then need to call the office and see if the doctor you have chosen will accept you as a new patient and what types of options they offer for patients who are self-pay. Some doctors offer discounts or will set up payment plans for their patients who do not have insurance, but you will need to ask so you aren't surprised when you get to your appointment. ° °2) Contact Your Local Health Department °Not all health departments have doctors that can see patients for sick visits, but many do, so it is worth a call to see if yours does. If you don't know where your local health department is, you can check in your phone book. The CDC also has a tool to help you locate your state's health department, and many state websites also have listings of all of their local health departments. ° °3) Find a Walk-in Clinic °If your illness is not likely to be very severe or complicated, you may want to try a walk in clinic. These are popping up all over the country in pharmacies, drugstores, and shopping centers. They're usually staffed by nurse practitioners or physician assistants that have been trained to treat common illnesses and complaints. They're usually fairly quick and inexpensive. However, if you have serious medical issues or chronic medical problems, these are probably not your best option. ° °No Primary Care Doctor: °- Call [facility] at  832-8000 - they can help you locate a primary care doctor that  accepts your insurance, provides certain services, etc. °- Physician Referral Service- 1-800-533-3463 ° °Chronic Pain Problems: °Organization         Address  Phone   Notes  °[facility]  ([phone]) [phone] Patients need to be referred by their primary care doctor.  ° °Medication  Assistance: °Organization         Address  Phone   Notes  °[place] [facility] [address] °[place], [place] [address] (336) 641-8030 --Must be a resident of [place] °-- Must have NO insurance coverage whatsoever (no Medicaid/ Medicare, etc.) °-- The pt. MUST have a primary care doctor that directs their care regularly and follows them in the community °  °[facility]  (866) 331-1348   °[employer]  (888) 892-1162   ° °Agencies that provide inexpensive medical care: °Organization         Address  Phone   Notes  °[facility]  (336) 832-8035   °[facility]    (336) 832-7272   °[facility] [address] °[place], [place] [address] (336) 832-4777   °[facility] [address], °[place] (336) 271-4999   °[facility]    (336) 373-0678   °[facility]    (336) 272-1050   °[facility] ° [address], [place] Phone:  (336) 832-4444, Fax:  (336) 832-4440 Hours of Operation:  9 am - 6 pm, M-F.  Also accepts Medicaid/Medicare and self-pay.  °[facility] ° [address], [place] Phone: (336) [phone], Fax: (336) [phone]. Hours of Operation:  8:30 am - 5:30 pm, M-F.  Also accepts Medicaid and self-pay.  °[facility] [place] [address], [place] Phone: (336) 878-6027   °[facility] [address], [place], [place] (336)723-1848, Ext. 123 Mondays & Thursdays: 7-9 AM.  First 15 patients are seen on a first come, first serve basis. °  ° °Medicaid-accepting [place] Providers: ° °Organization         Address  Phone   Notes  °[facility] [address], [place] (336) [phone] Also accepts self-pay patients.  °[facility] [address], [place] ° (336) 856-9996   °[facility] [address], [place]  (336) 288-8857   °[facility] [address] [place] [address], [place] (336) 299-7000   °Veita Bland [address], [place]  ° (336) 373-1557 Only accepts [place] Access Medicaid patients after they have their name applied to their card.  ° °Self-Pay (no insurance) in [place]: ° °Organization         Address  Phone   Notes  °Sickle Cell Patients, [facility] [address], [place] (336) 832-1970   °[facility] [address], [place] (336) 832-4400   °[facility] ° [address], [place] (336) 992-4800   °[facility]/Dr. Osei-Bonsu ° [address] [place] [address], [place] or [address], [place] (336) [phone] Phone number for both [place] and [place] locations is the same.  °[facility] [address], [place] (336) 299-0000   °[facility] [address] [place] [address], [place] or [address] (336) 852-7530 °(336) 878-2260   °[facility] [address], [place] (336) 350-1642, phone; (336) 294-5005, fax Sees patients 1st and 3rd Saturday of every month.  Must not qualify for public or private insurance (i.e. Medicaid, Medicare, [place] Health Choice, Veterans' Benefits) • Household income should be no more than 200% of the poverty level •The clinic cannot treat you if you are pregnant or think you are pregnant • Sexually transmitted diseases are not treated at the clinic.  ° ° °Dental Care: °Organization         Address  Phone  Notes  °[place] [facility] [address], [place] (336) 641-6152 Accepts children up to age 21 who are enrolled in Medicaid or [place] Health Choice; pregnant women with a Medicaid card; and children who have applied for Medicaid or [place] Health Choice, but were declined, whose parents can pay a reduced fee at time of service.  °[place]  [facility] [place]  [address], [place] (336) 641-7733 Accepts children up to age 21 who are enrolled in Medicaid or [place] Health Choice; pregnant women with a Medicaid card; and children who have applied for Medicaid or [place] Health Choice, but were declined, whose parents can pay a reduced fee at time of service.  °[facility] ° [address], [place] (336) 641-4533 Patients are seen by appointment only. Walk-ins are not accepted. [facility] will see patients 18 years of age and older. °Monday - Tuesday (8am-5pm) °Most Wednesdays (8:30-5pm) °$30 per visit, cash only  °[facility] ° [address], [place] (336) 641-4533 Patients are seen by appointment only. Walk-ins are not accepted. [facility] will see patients 18 years of age and older. °One   Wednesday Evening (Monthly: Volunteer Based).  $30 per visit, cash only  °[facility]  (919) 537-3737 for adults; Children under age 4, call Graduate Pediatric Dentistry at ([phone]) [phone]. Children aged 4-14, please call ([phone]) [phone] to request a pediatric application. ° Dental services are provided in all areas of dental care including fillings, crowns and bridges, complete and partial dentures, implants, gum treatment, root canals, and extractions. Preventive care is also provided. Treatment is provided to both adults and children. °Patients are selected via a lottery and there is often a waiting list. °  °[facility] [address], °[place] ° (336) 763-8833 [email or web address].[email or web address] °  °[facility] [address], [place], [place] (336)723-1848, Ext. 123 Second and Fourth Thursday of each month, opens at 6:30 AM; Clinic ends at 9 AM.  Patients are seen on a first-come first-served basis, and a limited number are seen during each clinic.  ° °[facility] ° [address], [place], [place] (336) [phone]    Eligibility Requirements °You must have lived in [place], [place], or [place] counties for at least the last three months. °  You cannot be eligible for state or federal sponsored healthcare insurance, including Veterans Administration, Medicaid, or Medicare. °  You generally cannot be eligible for healthcare insurance through your employer.  °  How to apply: °Eligibility screenings are held every Tuesday and Wednesday afternoon from 1:00 pm until 4:00 pm. You do not need an appointment for the interview!  °[facility] [address], [place], [place] 336-631-2330   °[facility]  336-342-8273   °[facility]  336-703-3100   °[facility]  336-570-6415   ° °Behavioral Health Resources in the Community: °Intensive Outpatient Programs °Organization         Address  Phone  Notes  °[facility] [address], [place], [place] 336-878-6098   °[facility] [address], [place], [place] 336-832-9800   °[facility] [address], [place], [place] ° 336-882-2125   °[facility] [address],  °[place], [place] 1-800-853-5163 or 336-641-4981   °Substance Abuse Resources °Organization         Address  Phone  Notes  °[facility]  336-882-2125   °[facility]  336-784-9470   °[facility]  336-285-9073   °[facility]  336-845-3988   °Residential & Outpatient Substance Abuse Program  1-800-659-3381   °Psychological Services °Organization         Address  Phone  Notes  °[facility]  336- 832-9600   °[facility]  336- 378-7881   °[facility] [address], [place] 1-800-853-5163 or 336-641-4981   ° °Mobile Crisis Teams °Organization         Address  Phone  Notes  °[facility], Mobile Crisis Care Unit  1-877-626-1772   °Assertive °[facility] ° [address]  [place], [place] 336-834-9664   °Sharon DeEsch [address] °[place] 336-554-5454   ° °Self-Help/Support Groups °Organization         Address  Phone             Notes  °[facility]  - variety of support groups  336- 373-1402 Call for more information  °Narcotics Anonymous (NA), [facility] [address], °[place]  2 meetings at this location  ° °  Residential Treatment Programs Organization         Address  Phone  Notes  [facility] [address],    [place]  0-017-494-4967   [facility]  [address] [place] [address], [place], [place] [phone]   [facility] [address] [place], [place] [phone] Admissions: 8am-3pm M-F  [facility] [address],    [place], [place] 923-300-7622   [facility] [address] [place], [place], [place] [phone]   [facility] [address]  [place], [place] [phone]   [facility] Intensive Outpatient [address], [place], [place] 768-115-7262   [facility] [address] [place].,  [place], [place] 0-355-974-1638 or 669-400-1823   [facility] [address] [place], [place] 122-482-5003 Accepts Medicaid  [facility] [place] [address]  [place] 7-048-889-1694 Substance Abuse/Addiction Treatment   [facility]         Address  Phone  Notes  [facility]  (919)670-7469   Angie Fava, PhD [address] [place], [place]   867-450-7665 or 929-788-6384   [facility]   [address] [place], [place] [phone]   [facility] [address], [place], [place] [phone] Insurance/Medicaid/sponsorship through [facility] [address]                                    [place], [place] ([phone] Therapy/tele-psych/case    [facility] [address], [place] [phone]    Dr. Lolly Mustache  564-200-3477   [facility]  [employer] [facility] 1) [address] [place] 2) [address] [place] 3)  [address], [place] [phone]  (908) [phone]   [facility] Child Abuse Hotline 318-226-7657 or 3150245876 (After Hours)      Take the prescriptions as directed. Your CT scan also showed an incidental finding of a right pulmonary nodule. Call your regular medical doctor today to schedule a follow up appointment within the next week for your urine infection, and follow up the pulmonary nodule finding in the next 6 months.  Return to the Emergency Department immediately sooner if worsening.

## 2015-01-19 NOTE — ED Provider Notes (Signed)
CSN: [id number]     Arrival date & time 01/19/15  1303 History   First MD Initiated Contact with Patient 01/19/15 1318     Chief Complaint  Patient presents with  . Flank Pain      HPI Pt was seen at 1320. Per pt, c/o gradual onset and persistence of constant dysuria since last night. States it "burns" when she urinates and she "just dribbles a little bit of urine." Has been associated with right sided torso/flank "pain." Denies hematuria, no fevers, no abd pain, no N/V/D, no vaginal bleeding/discharge, no rash.     Past Medical History  Diagnosis Date  . Diabetes mellitus   . Hypertension   . Asthma   . Overactive bladder   . High cholesterol   . Fibromyalgia   . Panic attacks   . Obstructive sleep apnea     Noncompliant with CPAP due to claustophobia.  . H/O cardiac catheterization     No significant CAD (only 20% LAD) 03/02/13 with normal LV function.  . Rectocele 05/21/2013  . OAB (overactive bladder) 05/21/2013  . Seizures     Had 1 with negative W/U 08/2014  . Chronic pelvic pain in female   . Headache   . Chronic leg pain     bilateral  . Chronic back pain    Past Surgical History  Procedure Laterality Date  . Abdominal hysterectomy    . Knee replacements      bilateral  . Back surgery    . Tonsillectomy    . Carpal tunnel release    . Ankle surgery    . Tubal ligation    . Left heart catheterization with coronary angiogram N/A 03/02/2013    Procedure: LEFT HEART CATHETERIZATION WITH CORONARY ANGIOGRAM;  Surgeon: Peter M Swaziland, MD;  Location: [facility] C[facility];  Service: Cardiovascular;  Laterality: N/A;  . Cardiac catheterization  02/2013    no significant coronary artery disease   Family History  Problem Relation Age of Onset  . Heart attack Mother   . Diabetes Mother   . Hypertension Mother   . Sick sinus syndrome Brother     Pacemaker  . Congenital heart disease Brother   . Stroke Brother   . Coronary artery disease Brother   . Cancer Maternal Aunt      breast, liver,lung  . Cancer Cousin     leukemia   History  Substance Use Topics  . Smoking status: Former Smoker -- 1.00 packs/day for 2 years    Types: Cigarettes    Quit date: 12/13/1974  . Smokeless tobacco: Never Used  . Alcohol Use: No   OB History    Gravida Para Term Preterm AB TAB SAB Ectopic Multiple Living   2 2 2       2      Review of Systems ROS: Statement: All systems negative except as marked or noted in the HPI; Constitutional: Negative for fever and chills. ; ; Eyes: Negative for eye pain, redness and discharge. ; ; ENMT: Negative for ear pain, hoarseness, nasal congestion, sinus pressure and sore throat. ; ; Cardiovascular: Negative for chest pain, palpitations, diaphoresis, dyspnea and peripheral edema. ; ; Respiratory: Negative for cough, wheezing and stridor. ; ; Gastrointestinal: Negative for nausea, vomiting, diarrhea, abdominal pain, blood in stool, hematemesis, jaundice and rectal bleeding. . ; ; Genitourinary: +dysuria, flank pain. Negative for hematuria. ; ; Musculoskeletal: Negative for back pain and neck pain. Negative for swelling and trauma.; ; Skin: Negative for  pruritus, rash, abrasions, blisters, bruising and skin lesion.; ; Neuro: Negative for headache, lightheadedness and neck stiffness. Negative for weakness, altered level of consciousness , altered mental status, extremity weakness, paresthesias, involuntary movement, seizure and syncope.      Allergies  Aspirin and Codeine  Home Medications   Prior to Admission medications   Medication Sig Start Date End Date Taking? Authorizing Provider  atorvastatin (LIPITOR) 40 MG tablet Take 40 mg by mouth at bedtime.  10/15/14  Yes Historical Provider, MD  escitalopram (LEXAPRO) 20 MG tablet Take 20 mg by mouth daily.  03/09/14  Yes Historical Provider, MD  imipramine (TOFRANIL) 50 MG tablet TAKE (1) TABLET BY MOUTH AT BEDTIME. 07/29/14  Yes Adline Potter, NP  levETIRAcetam (KEPPRA) 500 MG tablet Take 1  tablet (500 mg total) by mouth 2 (two) times daily. 07/23/14  Yes Jerald Kief, MD  levothyroxine (SYNTHROID, LEVOTHROID) 50 MCG tablet Take 50 mcg by mouth daily before breakfast.  03/09/14  Yes Historical Provider, MD  metFORMIN (GLUCOPHAGE) 500 MG tablet Take 500 mg by mouth 2 (two) times daily with a meal.  03/26/14  Yes Historical Provider, MD  metoprolol (LOPRESSOR) 50 MG tablet Take 50 mg by mouth 2 (two) times daily.     Yes Historical Provider, MD  PROAIR HFA 108 (90 BASE) MCG/ACT inhaler Inhale 2 puffs into the lungs every 4 (four) hours as needed. For shortness of breath 10/01/14  Yes Historical Provider, MD  TOVIAZ 8 MG TB24 tablet TAKE ONE TABLET BY MOUTH ONCE DAILY. 11/28/14  Yes Adline Potter, NP  HYDROmorphone (DILAUDID) 4 MG tablet Take 1 tablet (4 mg total) by mouth every 6 (six) hours as needed for severe pain. Patient not taking: Reported on 01/19/2015 01/10/15   Vanetta Mulders, MD  nystatin (MYCOSTATIN/NYSTOP) 100000 UNIT/GM POWD Apply 1 g topically 3 (three) times daily. Patient not taking: Reported on 01/19/2015 11/19/13   Adline Potter, NP  nystatin-triamcinolone ointment [facility]) Apply 1 application topically 3 (three) times daily. Patient not taking: Reported on 01/19/2015 11/04/14   Adline Potter, NP   BP 117/87 mmHg  Pulse 60  Temp(Src) 97.6 F (36.4 C) (Oral)  Resp 16  Ht 5\' 7"  (1.702 m)  Wt 283 lb (128.368 kg)  BMI 44.31 kg/m2  SpO2 100% Physical Exam  1325: Physical examination:  Nursing notes reviewed; Vital signs and O2 SAT reviewed;  Constitutional: Well developed, Well nourished, Well hydrated, In no acute distress; Head:  Normocephalic, atraumatic; Eyes: EOMI, PERRL, No scleral icterus; ENMT: Mouth and pharynx normal, Mucous membranes moist; Neck: Supple, Full range of motion, No lymphadenopathy; Cardiovascular: Regular rate and rhythm, No gallop; Respiratory: Breath sounds clear & equal bilaterally, No wheezes.  Speaking full sentences with ease,  Normal respiratory effort/excursion; Chest: Nontender, Movement normal; Abdomen: Soft, Nontender, Nondistended, Normal bowel sounds; Genitourinary: No CVA tenderness; Spine:  No midline CS, TS, LS tenderness. +TTP right lumbar paraspinal muscles and right side of torso.;; Extremities: Pulses normal, No tenderness, No edema, No calf edema or asymmetry.; Neuro: AA&Ox3, Major CN grossly intact.  Speech clear. No gross focal motor or sensory deficits in extremities. Climbs on and off stretcher easily by herself. Gait steady.; Skin: Color normal, Warm, Dry.   ED Course  Procedures      EKG Interpretation None      MDM  MDM Reviewed: previous chart, nursing note and vitals Reviewed previous: labs Interpretation: labs and CT scan     Results for orders placed or performed  during the hospital encounter of 01/19/15  Urinalysis, Routine w reflex microscopic  Result Value Ref Range   Color, Urine YELLOW YELLOW   APPearance HAZY (A) CLEAR   Specific Gravity, Urine 1.020 1.005 - 1.030   pH 6.5 5.0 - 8.0   Glucose, UA NEGATIVE NEGATIVE mg/dL   Hgb urine dipstick TRACE (A) NEGATIVE   Bilirubin Urine NEGATIVE NEGATIVE   Ketones, ur NEGATIVE NEGATIVE mg/dL   Protein, ur NEGATIVE NEGATIVE mg/dL   Urobilinogen, UA 0.2 0.0 - 1.0 mg/dL   Nitrite NEGATIVE NEGATIVE   Leukocytes, UA SMALL (A) NEGATIVE  Urine microscopic-add on  Result Value Ref Range   Squamous Epithelial / LPF FEW (A) RARE   WBC, UA 7-10 <3 WBC/hpf   RBC / HPF 3-6 <3 RBC/hpf   Bacteria, UA MANY (A) RARE  POC CBG, ED  Result Value Ref Range   Glucose-Capillary 81 70 - 99 mg/dL   Dg Chest Portable 1 View 01/10/2015   CLINICAL DATA:  Left-sided chest pain.  Left arm numbness.  EXAM: PORTABLE CHEST - 1 VIEW  COMPARISON:  01/10/2014  FINDINGS: Lungs are clear without airspace disease or edema. Heart and mediastinum are within normal limits. The trachea is midline. Negative for a pneumothorax. No acute bone abnormality.  IMPRESSION:  No acute chest findings.   Electronically Signed   By: Richarda Overlie M.D.   On: 01/10/2015 17:13   Ct Renal Stone Study 01/19/2015   CLINICAL DATA:  Right flank pain for 24 hr, unable to void  EXAM: CT ABDOMEN AND PELVIS WITHOUT CONTRAST  TECHNIQUE: Multidetector CT imaging of the abdomen and pelvis was performed following the standard protocol without IV contrast.  COMPARISON:  07/29/2010  FINDINGS: Lower chest: 5 mm pulmonary parenchymal nodule identified in the right middle lobe image 5. Lung bases are otherwise clear. No pleural effusion.  Hepatobiliary: Hepatic granulomas are identified. Unenhanced liver is otherwise unremarkable. Gallbladder appears normal.  Pancreas: Normal  Spleen: Splenic granulomas are incidentally noted. Spleen is otherwise unremarkable.  Adrenals/Urinary Tract: 0.8 cm exophytic isodense left lower renal pole lesion identified image 47. No hydroureteronephrosis. No radiopaque renal or ureteral calculus. Right kidney is unremarkable. Adrenal glands appear normal.  Stomach/Bowel: The appendix is not identified but there is no secondary evidence for acute appendicitis. No bowel wall thickening or focal segmental dilatation is identified.  Vascular/Lymphatic: There is subjective prominence of mesenteric and retroperitoneal nodes, more notable for their number than their individual size. Representative retroperitoneal node measures 0.9 cm image 34. There is minimal haziness to the small bowel mesentery within the left upper quadrant, image 47, with mesenteric nodes measuring less than 5 mm in maximal short axis diameter. Minimal atheromatous aortic calcification noted without aneurysm.  Right lower quadrant/ inguinal subcutaneous vascular collaterals are incidentally noted.  Other: Ovaries appear normal. Uterus surgically absent per report. Bladder decompressed but unremarkable. No pelvic lymphadenopathy or free fluid. No intra-abdominal free air.  Musculoskeletal: Posterior lumbar fusion  hardware spanning L4-L5 is noted. Vertebral body heights are preserved. No acute osseous abnormality. Left iliac presumed bone graft harvest site noted.  IMPRESSION: No acute intra-abdominal or pelvic pathology. Nonspecific prominence of the mesenteric and retroperitoneal lymph nodes which may indicate adenitis or less likely hematologic malignancy.  Sub cm exophytic left lower renal pole cortical lesion, too small for further characterization at unenhanced CT and best evaluated at outpatient nonemergent abdominal MRI with contrast according to renal mass protocol.  5 mm right middle lobe pulmonary nodule. If the patient  is at high risk for bronchogenic carcinoma, follow-up chest CT at 6-12 months is recommended. If the patient is at low risk for bronchogenic carcinoma, follow-up chest CT at 12 months is recommended. This recommendation follows the consensus statement: Guidelines for Management of Small Pulmonary Nodules Detected on CT Scans: A Statement from the Fleischner Society as published in Radiology 2005;237:395-400.   Electronically Signed   By: Christiana Pellant M.D.   On: 01/19/2015 14:56    1540:  Bladder scan on arrival with 70ml. +UTI, UC pending; will tx with IM rocephin. VS remain stable. Pt has tol PO well without N/V. Pt feels better after pain meds and wants to go home now. Dx and testing d/w pt and family.  Questions answered.  Verb understanding, agreeable to d/c home with outpt f/u.   Samuel Jester, DO 01/22/15 [phone]

## 2015-01-19 NOTE — ED Notes (Signed)
Bladder scan completed, result 75ml. Patient in and out performed by myself with Leeroy Bock, ED Tech as chaperone. Clear, yellow urine obtained.

## 2015-01-21 LAB — URINE CULTURE: Colony Count: >=100000

## 2015-01-22 ENCOUNTER — Telehealth (HOSPITAL_BASED_OUTPATIENT_CLINIC_OR_DEPARTMENT_OTHER): Payer: Self-pay | Admitting: Emergency Medicine

## 2015-01-22 NOTE — Telephone Encounter (Signed)
Post ED Visit - Positive Culture Follow-up  Culture report reviewed by antimicrobial stewardship pharmacist: []  Wes , Pharm.D., BCPS [x]  Kathryne Eriksson, Pharm.D., BCPS []  , Celedonio Miyamoto.D., BCPS []  [place], Georgina Pillion.D., BCPS, AAHIVP []  [address], Pharm.D., BCPS, AAHIVP []  , [place].D., BCPS  Positive urine culture E. Coli Treated with cephalexin, organism sensitive to the same and no further patient follow-up is required at this time.  [place] 01/22/2015, 5:05 PM

## 2015-02-27 ENCOUNTER — Other Ambulatory Visit: Payer: Self-pay | Admitting: Adult Health

## 2015-05-04 ENCOUNTER — Encounter (HOSPITAL_COMMUNITY): Payer: Self-pay

## 2015-05-04 ENCOUNTER — Emergency Department (HOSPITAL_COMMUNITY)
Admission: EM | Admit: 2015-05-04 | Discharge: 2015-05-04 | Disposition: A | Discharge status: Home / Self Care | Payer: Commercial Managed Care - HMO | Attending: Emergency Medicine | Admitting: Emergency Medicine

## 2015-05-04 DIAGNOSIS — J45909 Unspecified asthma, uncomplicated: Secondary | ICD-10-CM | POA: Insufficient documentation

## 2015-05-04 DIAGNOSIS — Z792 Long term (current) use of antibiotics: Secondary | ICD-10-CM | POA: Diagnosis not present

## 2015-05-04 DIAGNOSIS — M436 Torticollis: Secondary | ICD-10-CM | POA: Diagnosis not present

## 2015-05-04 DIAGNOSIS — G8929 Other chronic pain: Secondary | ICD-10-CM | POA: Diagnosis not present

## 2015-05-04 DIAGNOSIS — E119 Type 2 diabetes mellitus without complications: Secondary | ICD-10-CM | POA: Insufficient documentation

## 2015-05-04 DIAGNOSIS — I10 Essential (primary) hypertension: Secondary | ICD-10-CM | POA: Insufficient documentation

## 2015-05-04 DIAGNOSIS — M542 Cervicalgia: Secondary | ICD-10-CM | POA: Diagnosis present

## 2015-05-04 DIAGNOSIS — H9201 Otalgia, right ear: Secondary | ICD-10-CM | POA: Insufficient documentation

## 2015-05-04 DIAGNOSIS — F410 Panic disorder [episodic paroxysmal anxiety] without agoraphobia: Secondary | ICD-10-CM | POA: Diagnosis not present

## 2015-05-04 DIAGNOSIS — M797 Fibromyalgia: Secondary | ICD-10-CM | POA: Insufficient documentation

## 2015-05-04 DIAGNOSIS — Z79899 Other long term (current) drug therapy: Secondary | ICD-10-CM | POA: Diagnosis not present

## 2015-05-04 DIAGNOSIS — G40909 Epilepsy, unspecified, not intractable, without status epilepticus: Secondary | ICD-10-CM | POA: Diagnosis not present

## 2015-05-04 DIAGNOSIS — Z87891 Personal history of nicotine dependence: Secondary | ICD-10-CM | POA: Diagnosis not present

## 2015-05-04 DIAGNOSIS — Z8742 Personal history of other diseases of the female genital tract: Secondary | ICD-10-CM | POA: Diagnosis not present

## 2015-05-04 DIAGNOSIS — Z9889 Other specified postprocedural states: Secondary | ICD-10-CM | POA: Diagnosis not present

## 2015-05-04 DIAGNOSIS — E780 Pure hypercholesterolemia: Secondary | ICD-10-CM | POA: Insufficient documentation

## 2015-05-04 MED ORDER — IBUPROFEN 400 MG PO TABS
600.0000 mg | ORAL_TABLET | Freq: Once | ORAL | Status: AC
  Administered 2015-05-04: 600 mg via ORAL
  Filled 2015-05-04: qty 2

## 2015-05-04 MED ORDER — CYCLOBENZAPRINE HCL 10 MG PO TABS: 10.0000 mg | ORAL_TABLET | Freq: Two times a day (BID) | ORAL | Status: DC | PRN

## 2015-05-04 MED ORDER — CYCLOBENZAPRINE HCL 10 MG PO TABS
10.0000 mg | ORAL_TABLET | Freq: Once | ORAL | Status: AC
  Administered 2015-05-04: 10 mg via ORAL
  Filled 2015-05-04: qty 1

## 2015-05-04 NOTE — ED Notes (Signed)
MD Zackowsky at bedside.  

## 2015-05-04 NOTE — ED Notes (Signed)
Reports pain in base of neck x 1 day and radiates into head

## 2015-05-04 NOTE — ED Provider Notes (Signed)
CSN: [id number]     Arrival date & time 05/04/15  1100 History  This chart was scribed for non-physician practitioner Kerrie [place], NP, working with Vanetta Mulders, MD, by Tanda Rockers, ED Scribe. This patient was seen in room APFT21/APFT21 and the patient's care was started at 11:37 AM.    Chief Complaint  Patient presents with  . Neck Pain   The history is provided by the patient. No language interpreter was used.     HPI Comments: [name] is a 59 y.o. female with hx HTN, DM, HLD, Fibromyalgia, and seizures ,who presents to the Emergency Department complaining of sudden onset, constant, right posterior neck pain x 1 day. Pt states that she woke up with the pain. She describes the pain as sharp in sensation.The pain radiates to her right ear. The pain is exacerbated with moving her head side to side. Pt has not taken any pain medication since onset. She is concerned about her neck feeling this way due to her pmhx of seizures. No weakness, numbness, nausea, vomiting, or any other symptoms.    Past Medical History  Diagnosis Date  . Diabetes mellitus   . Hypertension   . Asthma   . Overactive bladder   . High cholesterol   . Fibromyalgia   . Panic attacks   . Obstructive sleep apnea     Noncompliant with CPAP due to claustophobia.  . H/O cardiac catheterization     No significant CAD (only 20% LAD) 03/02/13 with normal LV function.  . Rectocele 05/21/2013  . OAB (overactive bladder) 05/21/2013  . Seizures     Had 1 with negative W/U 08/2014  . Chronic pelvic pain in female   . Headache   . Chronic leg pain     bilateral  . Chronic back pain    Past Surgical History  Procedure Laterality Date  . Abdominal hysterectomy    . Knee replacements      bilateral  . Back surgery    . Tonsillectomy    . Carpal tunnel release    . Ankle surgery    . Tubal ligation    . Left heart catheterization with coronary angiogram N/A 03/02/2013    Procedure: LEFT HEART CATHETERIZATION WITH  CORONARY ANGIOGRAM;  Surgeon: Peter M Swaziland, MD;  Location: [facility] CATH LAB;  Service: Cardiovascular;  Laterality: N/A;  . Cardiac catheterization  02/2013    no significant coronary artery disease   Family History  Problem Relation Age of Onset  . Heart attack Mother   . Diabetes Mother   . Hypertension Mother   . Sick sinus syndrome Brother     Pacemaker  . Congenital heart disease Brother   . Stroke Brother   . Coronary artery disease Brother   . Cancer Maternal Aunt     breast, liver,lung  . Cancer Cousin     leukemia   History  Substance Use Topics  . Smoking status: Former Smoker -- 1.00 packs/day for 2 years    Types: Cigarettes    Quit date: 12/13/1974  . Smokeless tobacco: Never Used  . Alcohol Use: No   OB History    Gravida Para Term Preterm AB TAB SAB Ectopic Multiple Living   2 2 2       2      Review of Systems  Constitutional: Negative for fever.  HENT: Positive for ear pain.   Respiratory: Negative for shortness of breath.   Cardiovascular: Negative for chest pain and leg  swelling.  Gastrointestinal: Negative for nausea, vomiting, abdominal pain, constipation and abdominal distention.  Genitourinary: Negative for dysuria, urgency, frequency, flank pain and difficulty urinating.  Musculoskeletal: Positive for neck pain. Negative for back pain, joint swelling and gait problem.  Skin: Negative for rash.  Neurological: Negative for weakness and numbness.      Allergies  Aspirin and Codeine  Home Medications   Prior to Admission medications   Medication Sig Start Date End Date Taking? Authorizing Provider  atorvastatin (LIPITOR) 40 MG tablet Take 40 mg by mouth at bedtime.  10/15/14   Historical Provider, MD  cephALEXin (KEFLEX) 500 MG capsule Take 1 capsule (500 mg total) by mouth 4 (four) times daily. 01/19/15   Samuel Jester, DO  cyclobenzaprine (FLEXERIL) 10 MG tablet Take 1 tablet (10 mg total) by mouth 2 (two) times daily as needed for muscle  spasms. 05/04/15   Izzie Geers Orlene Och, NP  escitalopram (LEXAPRO) 20 MG tablet Take 20 mg by mouth daily.  03/09/14   Historical Provider, MD  HYDROmorphone (DILAUDID) 4 MG tablet Take 1 tablet (4 mg total) by mouth every 6 (six) hours as needed for severe pain. Patient not taking: Reported on 01/19/2015 01/10/15   Vanetta Mulders, MD  imipramine (TOFRANIL) 50 MG tablet TAKE (1) TABLET BY MOUTH AT BEDTIME. 02/27/15   Adline Potter, NP  levETIRAcetam (KEPPRA) 500 MG tablet Take 1 tablet (500 mg total) by mouth 2 (two) times daily. 07/23/14   Jerald Kief, MD  levothyroxine (SYNTHROID, LEVOTHROID) 50 MCG tablet Take 50 mcg by mouth daily before breakfast.  03/09/14   Historical Provider, MD  metFORMIN (GLUCOPHAGE) 500 MG tablet Take 500 mg by mouth 2 (two) times daily with a meal.  03/26/14   Historical Provider, MD  metoprolol (LOPRESSOR) 50 MG tablet Take 50 mg by mouth 2 (two) times daily.      Historical Provider, MD  nystatin (MYCOSTATIN/NYSTOP) 100000 UNIT/GM POWD Apply 1 g topically 3 (three) times daily. Patient not taking: Reported on 01/19/2015 11/19/13   Adline Potter, NP  nystatin-triamcinolone ointment [facility]) Apply 1 application topically 3 (three) times daily. Patient not taking: Reported on 01/19/2015 11/04/14   Adline Potter, NP  oxyCODONE-acetaminophen (PERCOCET/ROXICET) 5-325 MG per tablet 1 or 2 tabs PO q6h prn pain 01/19/15   Samuel Jester, DO  PROAIR HFA 108 (90 BASE) MCG/ACT inhaler Inhale 2 puffs into the lungs every 4 (four) hours as needed. For shortness of breath 10/01/14   Historical Provider, MD  TOVIAZ 8 MG TB24 tablet TAKE ONE TABLET BY MOUTH ONCE DAILY. 11/28/14   Adline Potter, NP   Triage Vitals: BP 127/88 mmHg  Pulse 59  Temp(Src) 98.1 F (36.7 C) (Oral)  Resp 20  Ht 5\' 7"  (1.702 m)  Wt 289 lb (131.09 kg)  BMI 45.25 kg/m2  SpO2 96%  Physical Exam  Constitutional: She is oriented to person, place, and time. She appears well-developed and  well-nourished. No distress.  HENT:  Head: Normocephalic and atraumatic.  Mouth/Throat: Uvula is midline. No posterior oropharyngeal edema or posterior oropharyngeal erythema.  Eyes: Conjunctivae and EOM are normal. Pupils are equal, round, and reactive to light.  Neck: Neck supple. No tracheal deviation present. No Brudzinski's sign and no Kernig's sign noted.  Cardiovascular: Normal rate, regular rhythm and normal heart sounds.   Pulmonary/Chest: Effort normal and breath sounds normal. No respiratory distress.  Musculoskeletal: Normal range of motion.  Pain to the right side of neck with palpation and with  range of motion.  No tenderness to C Spine. Radial pulses 2+ bilateral, adequate circulation, good touch sensation.    Neurological: She is alert and oriented to person, place, and time.  Skin: Skin is warm and dry.  Psychiatric: She has a normal mood and affect. Her behavior is normal.  Nursing note and vitals reviewed.   ED Course  Procedures (including critical care time)  DIAGNOSTIC STUDIES: Oxygen Saturation is 96% on RA, normal by my interpretation.    COORDINATION OF CARE: 11:40 AM-Discussed treatment plan which includes pain medication and  with pt at bedside and pt agreed to plan.    MDM  59 y.o. female with pain to the right side of her neck that started when she woke the day prior to coming to the ED. Stable for d/c without meningeal signs and no focal neuro deficits to the right hand.  Final diagnoses:  Right torticollis    I personally performed the services described in this documentation, which was scribed in my presence. The recorded information has been reviewed and is accurate.      [address] [place], [place] 05/10/15 [phone]

## 2015-05-04 NOTE — Discharge Instructions (Signed)
Take the medication as directed. Do not take if you are driving because it will make you sleepy. You may take tylenol or ibuprofen in addition to the muscle relaxant. Follow up with your doctor or return here as needed for worsening symptoms.

## 2015-05-04 NOTE — ED Provider Notes (Signed)
Medical screening examination/treatment/procedure(s) were conducted as a shared visit with non-physician practitioner(s) and myself.  I personally evaluated the patient during the encounter.   EKG Interpretation None      Patient seen by me. Patient awoke the yesterday with a stiff neck on the right side. Persisted into today. No injury. No focal deficits to the right hand. Pain is worse with movement. On examination of the right side of the neck is nontender radial pulses 2+ no neuro focal deficits to the right hand. Lungs are clear bilaterally heart regular rate and rhythm without murmurs. Symptomatically treatment will be appropriate.  Vanetta Mulders, MD 05/04/15 1240

## 2015-06-05 ENCOUNTER — Ambulatory Visit (INDEPENDENT_AMBULATORY_CARE_PROVIDER_SITE_OTHER): Payer: Commercial Managed Care - HMO | Admitting: Neurology

## 2015-06-05 ENCOUNTER — Encounter: Payer: Self-pay | Admitting: Neurology

## 2015-06-05 VITALS — BP 138/82 | HR 74 | Resp 18 | Ht 67.0 in | Wt 280.0 lb

## 2015-06-05 DIAGNOSIS — R519 Headache, unspecified: Secondary | ICD-10-CM | POA: Insufficient documentation

## 2015-06-05 DIAGNOSIS — M542 Cervicalgia: Secondary | ICD-10-CM | POA: Diagnosis not present

## 2015-06-05 DIAGNOSIS — R51 Headache: Secondary | ICD-10-CM | POA: Diagnosis not present

## 2015-06-05 DIAGNOSIS — G40309 Generalized idiopathic epilepsy and epileptic syndromes, not intractable, without status epilepticus: Secondary | ICD-10-CM | POA: Diagnosis not present

## 2015-06-05 DIAGNOSIS — G4733 Obstructive sleep apnea (adult) (pediatric): Secondary | ICD-10-CM | POA: Diagnosis not present

## 2015-06-05 DIAGNOSIS — M5481 Occipital neuralgia: Secondary | ICD-10-CM

## 2015-06-05 NOTE — Progress Notes (Signed)
[facility]  PATIENT: [name] DOB: 02/17/1956  REFERRING DOCTOR OR PCP:  Shelva Majestic SOURCE: patient and records form EMR and Dr. [name]  _________________________________   HISTORICAL  CHIEF COMPLAINT:  Chief Complaint  Patient presents with  . Seizures    Sts. in the fall of 2015, she had witnessed sz. like activity after waking.  Sts she was seen and treated at [facility]. she did not become alert, oriented for over 24 hours.  She was started on Keppra 500mg  bid.  She denies missed doses.    . Neck Pain    Sts. she had onset of neck pain a few months ago--neck pain leads to h/a.  Denies injury.  Denies radiation of pain into arms. She wonders if pain/h/a is related to seizure disorder.    . Headache  . Sleep Apnea    Sts. she was dx.  with osa 3-4 yrs. ago--uses cpap with pressure of 5-6.  Sts. sleep study was done at [facility].  Her pcp rx'd cpap but nobody ever followed up to check downloads.  She gets her supplies from [facility] in [place].  Sts. she has difficulty going to and staying asleep, doesn't snore with cpap as far as she knows.  She feels overly fatigued during the day.  "I have no energy at all."  Sts. pcp has told her fatigue is due to fibromyalgia./fim    HISTORY OF PRESENT ILLNESS:  [name] is a 59 year old woman who has a generalized tonic-clonic seizure last year and is now reporting right-sided neck and headache. He, she has a history of obstructive sleep apnea.  Seizure: Back in August 2015, she had generalized tonic-clonic seizure activity that was witnessed by her husband. Currently without any seating event and she does not think that she was sleep deprived at the time. She was taken to the hospital and admitted he actually has no memory of the rest of that Sunday almost a Monday. An MRI of the brain was performed and was essentially normal. An EEG was performed and showed 2 runs of  generalized spike wave activity, consistent with a generalized seizure disorder. Of note, she has no family history of seizures, though she does not know her father's family history. She has not had head trauma in the past. She had a normal childhood development. She has not had any meningitis or encephalitis.  Neck pain/headache: Over the past couple months she has had pain in the right back of her head and the top of the neck that radiates forward.   Pain is chronic and low level with the majority of the time but will intensify and shoot up her head periodically. The headaches intensify about once a week and will last few hours. She will take Advil and go to bed and they usually better when she gets up. When that occurs, she will often get blurry vision as well. She denies nausea but her headache pain will get much worse if she moves her head. She reports some photophobia and phonophobia.     Her last ophthalmologic visit was about 2 years ago. She occasionally will get some double vision. Occasionally she has some blurry vision. If she stands up she will sometimes get lightheaded.  Obstructive sleep apnea: About 4 or 5 years ago she was diagnosed with OSA with an overnight sleep study. She was started on CPAP by Dr. Tuesday.   She has not had any follow-up visits for her  sleep. She recalls that she had only one overnight study but that CPAP was used for the entire night.   She has received a new machine more recently through [facility] in [place]. She believes she has had her machine for about 2 years.   She reports fatigue sets of daytime sleepiness and we discussed that we will need to get her last download to determine if her settings need to be adjusted.  REVIEW OF SYSTEMS: Constitutional: No fevers, chills, sweats, or change in appetite Eyes: No visual changes, double vision, eye pain Ear, nose and throat: No hearing loss, ear pain, nasal congestion, sore throat Cardiovascular: No chest  pain, palpitations Respiratory: No shortness of breath at rest or with exertion.   No wheezes GastrointestinaI: No nausea, vomiting, diarrhea, abdominal pain, fecal incontinence Genitourinary: No dysuria, urinary retention or frequency.  No nocturia. Musculoskeletal: No neck pain, back pain Integumentary: No rash, pruritus, skin lesions Neurological: as above Psychiatric: No depression at this time.  No anxiety Endocrine: No palpitations, diaphoresis, change in appetite, change in weigh or increased thirst Hematologic/Lymphatic: No anemia, purpura, petechiae. Allergic/Immunologic: No itchy/runny eyes, nasal congestion, recent allergic reactions, rashes  ALLERGIES: Allergies  Allergen Reactions  . Aspirin Nausea And Vomiting  . Codeine Rash    HOME MEDICATIONS:  Current outpatient prescriptions:  .  atorvastatin (LIPITOR) 40 MG tablet, Take 40 mg by mouth at bedtime. , Disp: , Rfl:  .  imipramine (TOFRANIL) 50 MG tablet, TAKE (1) TABLET BY MOUTH AT BEDTIME., Disp: 30 tablet, Rfl: 6 .  levETIRAcetam (KEPPRA) 500 MG tablet, Take 1 tablet (500 mg total) by mouth 2 (two) times daily., Disp: 60 tablet, Rfl: 0 .  levothyroxine (SYNTHROID, LEVOTHROID) 50 MCG tablet, Take 50 mcg by mouth daily before breakfast. , Disp: , Rfl:  .  metFORMIN (GLUCOPHAGE) 500 MG tablet, Take 500 mg by mouth 2 (two) times daily with a meal. , Disp: , Rfl:  .  metoprolol (LOPRESSOR) 50 MG tablet, Take 50 mg by mouth 2 (two) times daily.  , Disp: , Rfl:  .  PROAIR HFA 108 (90 BASE) MCG/ACT inhaler, Inhale 2 puffs into the lungs every 4 (four) hours as needed. For shortness of breath, Disp: , Rfl:  .  TOVIAZ 8 MG TB24 tablet, TAKE ONE TABLET BY MOUTH ONCE DAILY., Disp: 30 tablet, Rfl: 6 .  cephALEXin (KEFLEX) 500 MG capsule, Take 1 capsule (500 mg total) by mouth 4 (four) times daily. (Patient not taking: Reported on 06/05/2015), Disp: 40 capsule, Rfl: 0 .  cyclobenzaprine (FLEXERIL) 10 MG tablet, Take 1 tablet (10  mg total) by mouth 2 (two) times daily as needed for muscle spasms. (Patient not taking: Reported on 06/05/2015), Disp: 20 tablet, Rfl: 0 .  escitalopram (LEXAPRO) 20 MG tablet, Take 20 mg by mouth daily. , Disp: , Rfl:  .  HYDROmorphone (DILAUDID) 4 MG tablet, Take 1 tablet (4 mg total) by mouth every 6 (six) hours as needed for severe pain. (Patient not taking: Reported on 01/19/2015), Disp: 20 tablet, Rfl: 0 .  nystatin (MYCOSTATIN/NYSTOP) 100000 UNIT/GM POWD, Apply 1 g topically 3 (three) times daily. (Patient not taking: Reported on 01/19/2015), Disp: 30 g, Rfl: prn .  nystatin-triamcinolone ointment (MYCOLOG), Apply 1 application topically 3 (three) times daily. (Patient not taking: Reported on 01/19/2015), Disp: 30 g, Rfl: prn .  oxyCODONE-acetaminophen (PERCOCET/ROXICET) 5-325 MG per tablet, 1 or 2 tabs PO q6h prn pain (Patient not taking: Reported on 06/05/2015), Disp: 20 tablet, Rfl: 0  PAST MEDICAL HISTORY: Past Medical History  Diagnosis Date  . Diabetes mellitus   . Hypertension   . Asthma   . Overactive bladder   . High cholesterol   . Fibromyalgia   . Panic attacks   . Obstructive sleep apnea     Noncompliant with CPAP due to claustophobia.  . H/O cardiac catheterization     No significant CAD (only 20% LAD) 03/02/13 with normal LV function.  . Rectocele 05/21/2013  . OAB (overactive bladder) 05/21/2013  . Seizures     Had 1 with negative W/U 08/2014  . Chronic pelvic pain in female   . Headache   . Chronic leg pain     bilateral  . Chronic back pain     PAST SURGICAL HISTORY: Past Surgical History  Procedure Laterality Date  . Abdominal hysterectomy    . Knee replacements      bilateral  . Back surgery    . Tonsillectomy    . Carpal tunnel release    . Ankle surgery    . Tubal ligation    . Left heart catheterization with coronary angiogram N/A 03/02/2013    Procedure: LEFT HEART CATHETERIZATION WITH CORONARY ANGIOGRAM;  Surgeon: Peter M Swaziland, MD;  Location: [facility] CATH  LAB;  Service: Cardiovascular;  Laterality: N/A;  . Cardiac catheterization  02/2013    no significant coronary artery disease    FAMILY HISTORY: Family History  Problem Relation Age of Onset  . Heart attack Mother   . Diabetes Mother   . Hypertension Mother   . Sick sinus syndrome Brother     Pacemaker  . Congenital heart disease Brother   . Stroke Brother   . Coronary artery disease Brother   . Cancer Maternal Aunt     breast, liver,lung  . Cancer Cousin     leukemia    SOCIAL HISTORY:  History   Social History  . Marital Status: Married    Spouse Name: N/A  . Number of Children: N/A  . Years of Education: N/A   Occupational History  . Not on file.   Social History Main Topics  . Smoking status: Former Smoker -- 1.00 packs/day for 2 years    Types: Cigarettes    Quit date: 12/13/1974  . Smokeless tobacco: Never Used  . Alcohol Use: No  . Drug Use: No  . Sexual Activity: Not Currently    Birth Control/ Protection: Surgical   Other Topics Concern  . Not on file   Social History Narrative     PHYSICAL EXAM  Filed Vitals:   06/05/15 1516  BP: 138/82  Pulse: 74  Resp: 18  Height: 5\' 7"  (1.702 m)  Weight: 280 lb (127.007 kg)    Body mass index is 43.84 kg/(m^2).   General: The patient is an obese woman in no acute distress  Eyes:  Funduscopic exam shows mild bilateral papilledema and normal retinal vessels.  Neck: The neck is supple, no carotid bruits are noted.  The neck is very tender over the right greater occipital nerve / splenius capitus muscle  Cardiovascular: The heart has a regular rate and rhythm with a normal S1 and S2. There were no murmurs, gallops or rubs.    Skin: Extremities are without significant edema.  Musculoskeletal:  Back is nontender  Neurologic Exam  Mental status: The patient is alert and oriented x 3 at the time of the examination. The patient has apparent normal recent and remote memory, with an apparently normal  attention span and concentration ability.   Speech is normal.  Cranial nerves: Extraocular movements are full. Pupils are equal, round, and reactive to light and accomodation.  Visual fields are full.  Facial symmetry is present. There is good facial sensation to soft touch bilaterally.Facial strength is normal.  Trapezius and sternocleidomastoid strength is normal. No dysarthria is noted.  The tongue is midline, and the patient has symmetric elevation of the soft palate. No obvious hearing deficits are noted.  Motor:  Muscle bulk is normal.   Tone is normal. Strength is  5 / 5 in all 4 extremities.   Sensory: Sensory testing is intact to soft touch but decreased vibration sensation in toes.  Coordination: Cerebellar testing reveals good finger-nose-finger and heel-to-shin bilaterally.  Gait and station: Station is normal.   Gait is normal. Tandem gait is normal. Romberg is negative.   Reflexes: Deep tendon reflexes are symmetric and normal bilaterally.   Plantar responses are flexor.    DIAGNOSTIC DATA (LABS, IMAGING, TESTING) - I reviewed patient records, labs, notes, testing and imaging myself where available.  Lab Results  Component Value Date   WBC 7.2 01/10/2015   HGB 14.7 01/10/2015   HCT 45.4 01/10/2015   MCV 91.7 01/10/2015   PLT 198 01/10/2015      Component Value Date/Time   NA 138 01/10/2015 1620   K 4.2 01/10/2015 1620   CL 104 01/10/2015 1620   CO2 28 01/10/2015 1620   GLUCOSE 106* 01/10/2015 1620   BUN 12 01/10/2015 1620   CREATININE 0.86 01/10/2015 1620   CALCIUM 8.9 01/10/2015 1620   PROT 7.1 01/10/2015 1620   ALBUMIN 4.1 01/10/2015 1620   AST 60* 01/10/2015 1620   ALT 69* 01/10/2015 1620   ALKPHOS 81 01/10/2015 1620   BILITOT 0.8 01/10/2015 1620   GFRNONAA 73* 01/10/2015 1620   GFRAA 84* 01/10/2015 1620   Lab Results  Component Value Date   CHOL 193 07/22/2014   HDL 37* 07/22/2014   LDLCALC 111* 07/22/2014   TRIG 226* 07/22/2014   CHOLHDL 5.2  07/22/2014   Lab Results  Component Value Date   HGBA1C 6.5* 07/22/2014      ASSESSMENT AND PLAN  Epilepsy, generalized, convulsive  Obstructive sleep apnea  Headache, occipital  Neck pain  Occipital neuralgia of right side  In summary, [name] is a 59 year old woman with a right sided chronic daily headache that started 2 months ago with frequent intensification's. She appears to have occipital neuralgia on the right. I did an occipital nerve block with 80 mg of depot Medrol and 3 mL Marcaine. She tolerated the procedure well and was much better a few minutes later. If the headaches return, I would consider adding a tricyclic or changing antiepileptic agents to one that might help her pain better.  She appears to have possible papilledema and I have asked her to follow-up with her ophthalmologist for an eye exam and visual field testing. If papilledema and visual field dysfunction is confirmed we will need to consider a repeat MRI to r/o a mass and lumbar puncture for diagnosis and treatment of possible pseudotumor cerebri.    She has not had any more seizures on Keppra. EEG suggests that she has a predisposition for seizures and therefore treatment should be continued lifelong. If she has any breakthrough seizures, I would consider switching to topiramate or Lamictal.  We could consider switching to topiramate anyhow as it might help weight loss and intracranial pressure.   Her final  problem is obstructive sleep apnea. She has a lot of of fatigue and some sleepiness. We will try to get some records to see if she needs to have adjustments of her CPAP pressure. Poorly treated OSA could also be a risk for headache and pseudotumor cerebri.  She will return to see me in 2 months or sooner if there are new or worsening neurologic symptoms.   ONB Get PSG Eye exam Continue Keppra, consider change to TPM or LAM if breakthrough  Daray Polgar A. Epimenio Foot, MD, PhD 06/05/2015, 3:24 PM Certified in  Neurology, Clinical Neurophysiology, Sleep Medicine, Pain Medicine and Neuroimaging  [facility] [address], [address] [place], [place] [address] [phone]

## 2015-06-30 ENCOUNTER — Other Ambulatory Visit: Payer: Self-pay | Admitting: Adult Health

## 2015-08-05 ENCOUNTER — Ambulatory Visit: Payer: Commercial Managed Care - HMO | Admitting: Neurology

## 2015-08-22 ENCOUNTER — Inpatient Hospital Stay (HOSPITAL_COMMUNITY)
Admission: EM | Admit: 2015-08-22 | Discharge: 2015-08-23 | DRG: 103 | Disposition: A | Discharge status: Home / Self Care | Payer: Commercial Managed Care - HMO | Attending: Internal Medicine | Admitting: Internal Medicine

## 2015-08-22 ENCOUNTER — Encounter (HOSPITAL_COMMUNITY): Payer: Self-pay | Admitting: Emergency Medicine

## 2015-08-22 ENCOUNTER — Emergency Department (HOSPITAL_COMMUNITY): Payer: Commercial Managed Care - HMO

## 2015-08-22 DIAGNOSIS — E119 Type 2 diabetes mellitus without complications: Secondary | ICD-10-CM

## 2015-08-22 DIAGNOSIS — F40240 Claustrophobia: Secondary | ICD-10-CM | POA: Diagnosis present

## 2015-08-22 DIAGNOSIS — R51 Headache: Secondary | ICD-10-CM | POA: Diagnosis not present

## 2015-08-22 DIAGNOSIS — I639 Cerebral infarction, unspecified: Secondary | ICD-10-CM

## 2015-08-22 DIAGNOSIS — R208 Other disturbances of skin sensation: Secondary | ICD-10-CM

## 2015-08-22 DIAGNOSIS — Z833 Family history of diabetes mellitus: Secondary | ICD-10-CM

## 2015-08-22 DIAGNOSIS — Z6841 Body Mass Index (BMI) 40.0 and over, adult: Secondary | ICD-10-CM

## 2015-08-22 DIAGNOSIS — F410 Panic disorder [episodic paroxysmal anxiety] without agoraphobia: Secondary | ICD-10-CM | POA: Diagnosis present

## 2015-08-22 DIAGNOSIS — Z8249 Family history of ischemic heart disease and other diseases of the circulatory system: Secondary | ICD-10-CM | POA: Diagnosis not present

## 2015-08-22 DIAGNOSIS — E780 Pure hypercholesterolemia: Secondary | ICD-10-CM | POA: Diagnosis present

## 2015-08-22 DIAGNOSIS — G4733 Obstructive sleep apnea (adult) (pediatric): Secondary | ICD-10-CM | POA: Diagnosis present

## 2015-08-22 DIAGNOSIS — R200 Anesthesia of skin: Secondary | ICD-10-CM | POA: Diagnosis present

## 2015-08-22 DIAGNOSIS — G40909 Epilepsy, unspecified, not intractable, without status epilepticus: Secondary | ICD-10-CM | POA: Diagnosis present

## 2015-08-22 DIAGNOSIS — R519 Headache, unspecified: Secondary | ICD-10-CM | POA: Diagnosis present

## 2015-08-22 DIAGNOSIS — Z87891 Personal history of nicotine dependence: Secondary | ICD-10-CM | POA: Diagnosis not present

## 2015-08-22 DIAGNOSIS — R569 Unspecified convulsions: Secondary | ICD-10-CM

## 2015-08-22 DIAGNOSIS — M797 Fibromyalgia: Secondary | ICD-10-CM | POA: Diagnosis present

## 2015-08-22 DIAGNOSIS — I10 Essential (primary) hypertension: Secondary | ICD-10-CM | POA: Diagnosis present

## 2015-08-22 DIAGNOSIS — Z806 Family history of leukemia: Secondary | ICD-10-CM | POA: Diagnosis not present

## 2015-08-22 DIAGNOSIS — Z9119 Patient's noncompliance with other medical treatment and regimen: Secondary | ICD-10-CM | POA: Diagnosis present

## 2015-08-22 DIAGNOSIS — Z823 Family history of stroke: Secondary | ICD-10-CM | POA: Diagnosis not present

## 2015-08-22 LAB — I-STAT CHEM 8, ED
BUN: 14 mg/dL (ref 6–20)
CALCIUM ION: 1.25 mmol/L — AB (ref 1.12–1.23)
CREATININE: 0.8 mg/dL (ref 0.44–1.00)
Chloride: 101 mmol/L (ref 101–111)
GLUCOSE: 114 mg/dL — AB (ref 65–99)
HCT: 42 % (ref 36.0–46.0)
HEMOGLOBIN: 14.3 g/dL (ref 12.0–15.0)
Potassium: 4.3 mmol/L (ref 3.5–5.1)
Sodium: 140 mmol/L (ref 135–145)
TCO2: 27 mmol/L (ref 0–100)

## 2015-08-22 LAB — COMPREHENSIVE METABOLIC PANEL
ALT: 39 U/L (ref 14–54)
AST: 48 U/L — AB (ref 15–41)
Albumin: 4.2 g/dL (ref 3.5–5.0)
Alkaline Phosphatase: 68 U/L (ref 38–126)
Anion gap: 6 (ref 5–15)
BILIRUBIN TOTAL: 0.6 mg/dL (ref 0.3–1.2)
BUN: 15 mg/dL (ref 6–20)
CO2: 30 mmol/L (ref 22–32)
CREATININE: 0.9 mg/dL (ref 0.44–1.00)
Calcium: 9.5 mg/dL (ref 8.9–10.3)
Chloride: 105 mmol/L (ref 101–111)
Glucose, Bld: 118 mg/dL — ABNORMAL HIGH (ref 65–99)
Potassium: 4.6 mmol/L (ref 3.5–5.1)
Sodium: 141 mmol/L (ref 135–145)
TOTAL PROTEIN: 7.3 g/dL (ref 6.5–8.1)

## 2015-08-22 LAB — DIFFERENTIAL
BASOS ABS: 0.1 10*3/uL (ref 0.0–0.1)
BASOS PCT: 1 % (ref 0–1)
Eosinophils Absolute: 0.1 10*3/uL (ref 0.0–0.7)
Eosinophils Relative: 2 % (ref 0–5)
LYMPHS ABS: 1.8 10*3/uL (ref 0.7–4.0)
Lymphocytes Relative: 30 % (ref 12–46)
MONO ABS: 0.5 10*3/uL (ref 0.1–1.0)
MONOS PCT: 8 % (ref 3–12)
Neutro Abs: 3.5 10*3/uL (ref 1.7–7.7)
Neutrophils Relative %: 59 % (ref 43–77)

## 2015-08-22 LAB — CREATININE, SERUM: CREATININE: 0.85 mg/dL (ref 0.44–1.00)

## 2015-08-22 LAB — CBC
HCT: 39.5 % (ref 36.0–46.0)
HEMATOCRIT: 45.7 % (ref 36.0–46.0)
HEMOGLOBIN: 13.7 g/dL (ref 12.0–15.0)
Hemoglobin: 13 g/dL (ref 12.0–15.0)
MCH: 30.9 pg (ref 26.0–34.0)
MCH: 30 pg (ref 26.0–34.0)
MCHC: 30 g/dL (ref 30.0–36.0)
MCHC: 32.9 g/dL (ref 30.0–36.0)
MCV: 103.2 fL — AB (ref 78.0–100.0)
MCV: 91 fL (ref 78.0–100.0)
PLATELETS: 178 10*3/uL (ref 150–400)
Platelets: 160 10*3/uL (ref 150–400)
RBC: 4.43 MIL/uL (ref 3.87–5.11)
RBC: 4.34 MIL/uL (ref 3.87–5.11)
RDW: 14 % (ref 11.5–15.5)
RDW: 12.5 % (ref 11.5–15.5)
WBC: 6.1 10*3/uL (ref 4.0–10.5)
WBC: 5.1 10*3/uL (ref 4.0–10.5)

## 2015-08-22 LAB — APTT: APTT: 28 s (ref 24–37)

## 2015-08-22 LAB — I-STAT TROPONIN, ED: TROPONIN I, POC: 0 ng/mL (ref 0.00–0.08)

## 2015-08-22 LAB — URINE MICROSCOPIC-ADD ON

## 2015-08-22 LAB — URINALYSIS, ROUTINE W REFLEX MICROSCOPIC
Bilirubin Urine: NEGATIVE
GLUCOSE, UA: NEGATIVE mg/dL
Hgb urine dipstick: NEGATIVE
KETONES UR: NEGATIVE mg/dL
Nitrite: NEGATIVE
Protein, ur: NEGATIVE mg/dL
Specific Gravity, Urine: 1.01 (ref 1.005–1.030)
Urobilinogen, UA: 0.2 mg/dL (ref 0.0–1.0)
pH: 7 (ref 5.0–8.0)

## 2015-08-22 LAB — RAPID URINE DRUG SCREEN, HOSP PERFORMED
Amphetamines: NOT DETECTED
BARBITURATES: NOT DETECTED
Benzodiazepines: NOT DETECTED
Cocaine: NOT DETECTED
Opiates: NOT DETECTED
Tetrahydrocannabinol: NOT DETECTED

## 2015-08-22 LAB — ETHANOL

## 2015-08-22 LAB — CBG MONITORING, ED
GLUCOSE-CAPILLARY: 127 mg/dL — AB (ref 65–99)
GLUCOSE-CAPILLARY: 111 mg/dL — AB (ref 65–99)

## 2015-08-22 LAB — PROTIME-INR
INR: 1.13 (ref 0.00–1.49)
Prothrombin Time: 14.7 seconds (ref 11.6–15.2)

## 2015-08-22 LAB — GLUCOSE, CAPILLARY: GLUCOSE-CAPILLARY: 148 mg/dL — AB (ref 65–99)

## 2015-08-22 LAB — TSH: TSH: 1.716 u[IU]/mL (ref 0.350–4.500)

## 2015-08-22 MED ORDER — PROMETHAZINE HCL 25 MG/ML IJ SOLN
12.5000 mg | Freq: Once | INTRAMUSCULAR | Status: AC
  Administered 2015-08-22: 12.5 mg via INTRAVENOUS
  Filled 2015-08-22: qty 1

## 2015-08-22 MED ORDER — ENOXAPARIN SODIUM 40 MG/0.4ML ~~LOC~~ SOLN: 40.0000 mg | SUBCUTANEOUS | Status: DC

## 2015-08-22 MED ORDER — METFORMIN HCL 500 MG PO TABS
500.0000 mg | ORAL_TABLET | Freq: Two times a day (BID) | ORAL | Status: DC
  Administered 2015-08-23: 500 mg via ORAL
  Filled 2015-08-22: qty 1

## 2015-08-22 MED ORDER — IMIPRAMINE HCL 25 MG PO TABS
50.0000 mg | ORAL_TABLET | Freq: Every day | ORAL | Status: DC
  Administered 2015-08-22: 50 mg via ORAL
  Filled 2015-08-22: qty 2

## 2015-08-22 MED ORDER — SODIUM CHLORIDE 0.9 % IV SOLN
INTRAVENOUS | Status: DC
  Administered 2015-08-22: via INTRAVENOUS

## 2015-08-22 MED ORDER — INFLUENZA VAC SPLIT QUAD 0.5 ML IM SUSY
0.5000 mL | PREFILLED_SYRINGE | INTRAMUSCULAR | Status: AC
  Administered 2015-08-23: 0.5 mL via INTRAMUSCULAR
  Filled 2015-08-22: qty 0.5

## 2015-08-22 MED ORDER — ALBUTEROL SULFATE HFA 108 (90 BASE) MCG/ACT IN AERS
2.0000 | INHALATION_SPRAY | RESPIRATORY_TRACT | Status: DC | PRN
  Filled 2015-08-22: qty 6.7

## 2015-08-22 MED ORDER — DEXAMETHASONE SODIUM PHOSPHATE 4 MG/ML IJ SOLN
10.0000 mg | Freq: Once | INTRAMUSCULAR | Status: AC
  Administered 2015-08-22: 10 mg via INTRAVENOUS
  Filled 2015-08-22: qty 3

## 2015-08-22 MED ORDER — DIPHENHYDRAMINE HCL 50 MG/ML IJ SOLN
25.0000 mg | Freq: Once | INTRAMUSCULAR | Status: AC
  Administered 2015-08-22: 25 mg via INTRAVENOUS
  Filled 2015-08-22: qty 1

## 2015-08-22 MED ORDER — LEVOTHYROXINE SODIUM 75 MCG PO TABS
75.0000 ug | ORAL_TABLET | Freq: Every day | ORAL | Status: DC
  Administered 2015-08-23: 75 ug via ORAL
  Filled 2015-08-22: qty 1

## 2015-08-22 MED ORDER — INSULIN ASPART 100 UNIT/ML ~~LOC~~ SOLN
0.0000 [IU] | Freq: Three times a day (TID) | SUBCUTANEOUS | Status: DC
  Administered 2015-08-23: 4 [IU] via SUBCUTANEOUS

## 2015-08-22 MED ORDER — INSULIN ASPART 100 UNIT/ML ~~LOC~~ SOLN
0.0000 [IU] | Freq: Every day | SUBCUTANEOUS | Status: DC
Start: 2015-08-22 — End: 2015-08-23

## 2015-08-22 MED ORDER — FESOTERODINE FUMARATE ER 4 MG PO TB24
8.0000 mg | ORAL_TABLET | Freq: Every day | ORAL | Status: DC
  Administered 2015-08-23: 8 mg via ORAL
  Filled 2015-08-22 (×3): qty 1

## 2015-08-22 MED ORDER — SODIUM CHLORIDE 0.9 % IV BOLUS (SEPSIS)
1000.0000 mL | Freq: Once | INTRAVENOUS | Status: AC
  Administered 2015-08-22: 1000 mL via INTRAVENOUS

## 2015-08-22 MED ORDER — ONDANSETRON HCL 4 MG/2ML IJ SOLN: 4.0000 mg | Freq: Four times a day (QID) | INTRAMUSCULAR | Status: DC | PRN

## 2015-08-22 MED ORDER — ENOXAPARIN SODIUM 60 MG/0.6ML ~~LOC~~ SOLN
60.0000 mg | SUBCUTANEOUS | Status: DC
  Administered 2015-08-22: 60 mg via SUBCUTANEOUS
  Filled 2015-08-22: qty 0.6

## 2015-08-22 MED ORDER — METOPROLOL TARTRATE 50 MG PO TABS
50.0000 mg | ORAL_TABLET | Freq: Two times a day (BID) | ORAL | Status: DC
  Administered 2015-08-22 – 2015-08-23 (×2): 50 mg via ORAL
  Filled 2015-08-22 (×2): qty 1

## 2015-08-22 MED ORDER — ALBUTEROL SULFATE (2.5 MG/3ML) 0.083% IN NEBU: 3.0000 mL | INHALATION_SOLUTION | RESPIRATORY_TRACT | Status: DC | PRN

## 2015-08-22 MED ORDER — LEVETIRACETAM 500 MG PO TABS
500.0000 mg | ORAL_TABLET | Freq: Two times a day (BID) | ORAL | Status: DC
  Administered 2015-08-22 – 2015-08-23 (×2): 500 mg via ORAL
  Filled 2015-08-22 (×2): qty 1

## 2015-08-22 MED ORDER — ATORVASTATIN CALCIUM 40 MG PO TABS
40.0000 mg | ORAL_TABLET | Freq: Every day | ORAL | Status: DC
Start: 2015-08-22 — End: 2015-08-23
  Administered 2015-08-22: 40 mg via ORAL
  Filled 2015-08-22: qty 1

## 2015-08-22 MED ORDER — ONDANSETRON HCL 4 MG PO TABS: 4.0000 mg | ORAL_TABLET | Freq: Four times a day (QID) | ORAL | Status: DC | PRN

## 2015-08-22 MED ORDER — CYCLOSPORINE 0.05 % OP EMUL
1.0000 [drp] | Freq: Two times a day (BID) | OPHTHALMIC | Status: DC
  Administered 2015-08-23: 1 [drp] via OPHTHALMIC
  Filled 2015-08-22 (×6): qty 1

## 2015-08-22 MED ORDER — SODIUM CHLORIDE 0.9 % IV SOLN: INTRAVENOUS | Status: DC

## 2015-08-22 NOTE — ED Notes (Signed)
Called code stroke 

## 2015-08-22 NOTE — Progress Notes (Signed)
CODE STROKE documentation 1632 - call from ED 1636 pt on scanner 1639 scan finished 1645 spoke with [facility] @ [facility] Radiology. Images sent to [facility] from  Scanner. Beeper did not go off.

## 2015-08-22 NOTE — ED Provider Notes (Signed)
CSN: [id number]     Arrival date & time 08/22/15  1541 History   First MD Initiated Contact with Patient 08/22/15 1645     Chief Complaint  Patient presents with  . Code Stroke     (Consider location/radiation/quality/duration/timing/severity/associated sxs/prior Treatment) The history is provided by the patient.   patient brought in by family member. Patient with onset of left-sided the facial numbness and left eye blurring started proximally an hour prior to arrival. Patient's had the complaint of a headache for 2 days. Patient does not have a history of migraines. Headache started on the right side and then now migrated to the left side. The headache pain is 9 out of 10. Not associated with nausea or vomiting. Patient has no weakness to her arms or legs no speech problems. Patient never had any symptoms like this associated with a headache in the past. Patient does have a history of seizures she is on Keppra and she is followed by [facility] neurology. Patient denies any fever or upper respiratory infection symptoms. No abdominal pain does have some neck pain that is a bit of a chronic problem. No rash.  Past Medical History  Diagnosis Date  . Diabetes mellitus   . Hypertension   . Asthma   . Overactive bladder   . High cholesterol   . Fibromyalgia   . Panic attacks   . Obstructive sleep apnea     Noncompliant with CPAP due to claustophobia.  . H/O cardiac catheterization     No significant CAD (only 20% LAD) 03/02/13 with normal LV function.  . Rectocele 05/21/2013  . OAB (overactive bladder) 05/21/2013  . Seizures     Had 1 with negative W/U 08/2014  . Chronic pelvic pain in female   . Headache   . Chronic leg pain     bilateral  . Chronic back pain    Past Surgical History  Procedure Laterality Date  . Abdominal hysterectomy    . Knee replacements      bilateral  . Back surgery    . Tonsillectomy    . Carpal tunnel release    . Ankle surgery    . Tubal ligation    . Left  heart catheterization with coronary angiogram N/A 03/02/2013    Procedure: LEFT HEART CATHETERIZATION WITH CORONARY ANGIOGRAM;  Surgeon: Peter M Swaziland, MD;  Location: [facility] CATH LAB;  Service: Cardiovascular;  Laterality: N/A;  . Cardiac catheterization  02/2013    no significant coronary artery disease   Family History  Problem Relation Age of Onset  . Heart attack Mother   . Diabetes Mother   . Hypertension Mother   . Sick sinus syndrome Brother     Pacemaker  . Congenital heart disease Brother   . Stroke Brother   . Coronary artery disease Brother   . Cancer Maternal Aunt     breast, liver,lung  . Cancer Cousin     leukemia   Social History  Substance Use Topics  . Smoking status: Former Smoker -- 1.00 packs/day for 2 years    Types: Cigarettes    Quit date: 12/13/1974  . Smokeless tobacco: Never Used  . Alcohol Use: No   OB History    Gravida Para Term Preterm AB TAB SAB Ectopic Multiple Living   2 2 2       2      Review of Systems  Constitutional: Negative for fever.  HENT: Negative for congestion.   Eyes: Positive for visual disturbance.  Respiratory: Negative for shortness of breath.   Cardiovascular: Negative for chest pain.  Gastrointestinal: Negative for abdominal pain.  Genitourinary: Negative for dysuria.  Musculoskeletal: Positive for neck pain. Negative for back pain.  Skin: Negative for rash.  Neurological: Positive for seizures, numbness and headaches. Negative for weakness.  Hematological: Does not bruise/bleed easily.  Psychiatric/Behavioral: Negative for confusion.      Allergies  Aspirin and Codeine  Home Medications   Prior to Admission medications   Medication Sig Start Date End Date Taking? Authorizing Provider  atorvastatin (LIPITOR) 40 MG tablet Take 40 mg by mouth at bedtime.  10/15/14  Yes Historical Provider, MD  imipramine (TOFRANIL) 50 MG tablet TAKE (1) TABLET BY MOUTH AT BEDTIME. 02/27/15  Yes Adline Potter, NP  levETIRAcetam  (KEPPRA) 500 MG tablet Take 1 tablet (500 mg total) by mouth 2 (two) times daily. 07/23/14  Yes Jerald Kief, MD  levothyroxine (SYNTHROID, LEVOTHROID) 75 MCG tablet Take 75 mcg by mouth daily before breakfast.   Yes Historical Provider, MD  metFORMIN (GLUCOPHAGE) 500 MG tablet Take 500 mg by mouth 2 (two) times daily with a meal.  03/26/14  Yes Historical Provider, MD  metoprolol (LOPRESSOR) 50 MG tablet Take 50 mg by mouth 2 (two) times daily.     Yes Historical Provider, MD  RESTASIS 0.05 % ophthalmic emulsion Place 1 vial into both eyes 2 (two) times daily. 08/14/15  Yes Historical Provider, MD  TOVIAZ 8 MG TB24 tablet TAKE ONE TABLET BY MOUTH ONCE DAILY. 06/30/15  Yes Adline Potter, NP  cephALEXin (KEFLEX) 500 MG capsule Take 1 capsule (500 mg total) by mouth 4 (four) times daily. Patient not taking: Reported on 06/05/2015 01/19/15   Samuel Jester, DO  cyclobenzaprine (FLEXERIL) 10 MG tablet Take 1 tablet (10 mg total) by mouth 2 (two) times daily as needed for muscle spasms. Patient not taking: Reported on 06/05/2015 05/04/15   Janne Napoleon, NP  HYDROmorphone (DILAUDID) 4 MG tablet Take 1 tablet (4 mg total) by mouth every 6 (six) hours as needed for severe pain. Patient not taking: Reported on 01/19/2015 01/10/15   Vanetta Mulders, MD  nystatin (MYCOSTATIN/NYSTOP) 100000 UNIT/GM POWD Apply 1 g topically 3 (three) times daily. Patient not taking: Reported on 01/19/2015 11/19/13   Adline Potter, NP  nystatin-triamcinolone ointment [facility]) Apply 1 application topically 3 (three) times daily. Patient not taking: Reported on 01/19/2015 11/04/14   Adline Potter, NP  oxyCODONE-acetaminophen (PERCOCET/ROXICET) 5-325 MG per tablet 1 or 2 tabs PO q6h prn pain Patient not taking: Reported on 06/05/2015 01/19/15   Samuel Jester, DO  PROAIR HFA 108 (90 BASE) MCG/ACT inhaler Inhale 2 puffs into the lungs every 4 (four) hours as needed. For shortness of breath 10/01/14   Historical Provider, MD   BP  134/80 mmHg  Pulse 62  Temp(Src) 97.8 F (36.6 C) (Oral)  Resp 16  Ht 5\' 7"  (1.702 m)  Wt 280 lb (127.007 kg)  BMI 43.84 kg/m2  SpO2 97% Physical Exam  Constitutional: She is oriented to person, place, and time. She appears well-developed and well-nourished. No distress.  HENT:  Head: Normocephalic and atraumatic.  Mouth/Throat: Oropharynx is clear and moist.  Eyes: Conjunctivae and EOM are normal. Pupils are equal, round, and reactive to light.  Neck: Normal range of motion.  Cardiovascular: Normal rate, regular rhythm and normal heart sounds.   No murmur heard. Pulmonary/Chest: Effort normal and breath sounds normal. No respiratory distress.  Abdominal: Soft. Bowel sounds  are normal. There is no tenderness.  Musculoskeletal: Normal range of motion.  Neurological: She is alert and oriented to person, place, and time. A cranial nerve deficit is present. She exhibits normal muscle tone. Coordination normal.  Patient states that the left eye vision is blurred in the subjective numbness to the left side of the face. Facial motor function is normal. No other neuro focal deficits.  Skin: Skin is warm. No rash noted.  Nursing note and vitals reviewed.   ED Course  Procedures (including critical care time) Labs Review Labs Reviewed  CBC - Abnormal; Notable for the following:    MCV 103.2 (*)    All other components within normal limits  COMPREHENSIVE METABOLIC PANEL - Abnormal; Notable for the following:    Glucose, Bld 118 (*)    AST 48 (*)    All other components within normal limits  CBG MONITORING, ED - Abnormal; Notable for the following:    Glucose-Capillary 111 (*)    All other components within normal limits  I-STAT CHEM 8, ED - Abnormal; Notable for the following:    Glucose, Bld 114 (*)    Calcium, Ion 1.25 (*)    All other components within normal limits  CBG MONITORING, ED - Abnormal; Notable for the following:    Glucose-Capillary 127 (*)    All other components  within normal limits  ETHANOL  PROTIME-INR  APTT  DIFFERENTIAL  URINE RAPID DRUG SCREEN, HOSP PERFORMED  URINALYSIS, ROUTINE W REFLEX MICROSCOPIC (NOT AT [facility])  I-STAT TROPOININ, ED    Imaging Review Ct Head Wo Contrast  08/22/2015   CLINICAL DATA:  Code stroke. Sudden onset pain in the left side of the head. Left eye blurred vision. Left-sided numbness. History of seizure.  EXAM: CT HEAD WITHOUT CONTRAST  TECHNIQUE: Contiguous axial images were obtained from the base of the skull through the vertex without intravenous contrast.  COMPARISON:  07/21/2014  FINDINGS: There is moderate central and cortical atrophy. There is no intra or extra-axial fluid collection or mass lesion. The basilar cisterns and ventricles have a normal appearance. There is no CT evidence for acute infarction or hemorrhage. Bone windows are unremarkable.  IMPRESSION: 1. Mild atrophy. 2.  No evidence for acute intracranial abnormality. 3. Critical Value/emergent results were called by telephone at the time of interpretation on 08/22/2015 at 4:49 pm to Dr. Deretha Emory, who verbally acknowledged these results.   Electronically Signed   By: Norva Pavlov M.D.   On: 08/22/2015 16:50   Mr Brain Wo Contrast  08/22/2015   CLINICAL DATA:  New onset of facial numbing and blurred vision 2 hours ago. The examination had to be discontinued prior to completion due to patient claustrophobia and refusal of further imaging.  EXAM: MRI HEAD WITHOUT CONTRAST  TECHNIQUE: Multiplanar, multiecho pulse sequences of the brain and surrounding structures were obtained without intravenous contrast.  COMPARISON:  CT head without contrast from the same day. MRI brain 07/22/2014.  FINDINGS: The diffusion-weighted images demonstrate no evidence for acute or subacute infarction. No significant white matter disease is present. The ventricles are of normal size. No significant extra-axial fluid collection is present.  Midline structures demonstrate a relatively  empty sella. The pituitary tissue is along the anterior surface of the sella. It is also conceivable that there is a cyst within the sella displacing the pituitary tissue anteriorly. Midline structures are otherwise unremarkable.  IMPRESSION: 1. Relatively empty sella versus is cystic lesion within the sella. Recommend repeat MRI of the  brain and pituitary as the patient's condition allows. Sedation for claustrophobia may be useful. 2. Discontinued exam without evidence for acute disease.   Electronically Signed   By: Marin Roberts M.D.   On: 08/22/2015 20:08   I have personally reviewed and evaluated these images and lab results as part of my medical decision-making.   EKG Interpretation   Date/Time:  Friday August 22 2015 16:45:32 EDT Ventricular Rate:  66 PR Interval:  178 QRS Duration: 92 QT Interval:  413 QTC Calculation: 433 R Axis:   17 Text Interpretation:  Sinus rhythm Low voltage, extremity and precordial  leads No significant change since last tracing Confirmed by Leilana Mcquire  MD,  Amala Petion [phone]) on 08/22/2015 5:08:54 PM       CRITICAL CARE Performed by: Vanetta Mulders Total critical care time: 30 Critical care time was exclusive of separately billable procedures and treating other patients. Critical care was necessary to treat or prevent imminent or life-threatening deterioration. Critical care was time spent personally by me on the following activities: development of treatment plan with patient and/or surrogate as well as nursing, discussions with consultants, evaluation of patient's response to treatment, examination of patient, obtaining history from patient or surrogate, ordering and performing treatments and interventions, ordering and review of laboratory studies, ordering and review of radiographic studies, pulse oximetry and re-evaluation of patient's condition.    MDM   Final diagnoses:  Acute intractable headache, unspecified headache type  Cerebral  infarction due to unspecified mechanism    Patient with presentation with concern for acute stroke. Patient underwent code stroke protocol. Head CT without any acute findings. Patient's had a headache for 2 days no history of renal migraines. Headache was right-sided and then radiated today over to the left side. Associated with left eye blurred vision and left-sided face numbness. Headache pain was 9 out of 10. Patient evaluated by the [facility] neuro hospitalist and felt that this may be a migraine-like equivalent however patient had significant risk factors for stroke and needed admission and workup. MRI was done but it was incomplete due to the patient's size however it showed no acute findings. However it was an incomplete study. Patient also has a history of seizures followed by [facility] neurology for that. No seizure activity. Patient is on Keppra.   Patient will require admission. Will consult hospitalist for admission. They will determine whether the patient will be admitted here or transfer down to [facility]. Patient also started on migraine cocktail type medicines to include Decadron Phenergan and Benadryl. Clinically I believe that there is a good chance that this is a migraine equivalent.    Vanetta Mulders, MD 08/22/15 2039

## 2015-08-22 NOTE — ED Notes (Addendum)
Patient c/o headache x2 days. Per patient pain starts in right side of neck and radiates into head. Patient states today it "has shot into left side of head." Patient states that left side vision is blurry and right side of face is numb. Denies any nausea, vomiting, slurred speech, weakness, or dizziness. Patient does states she feels lethargic. Per patient left side facial numbing and blurred vision started 2 hours ago.

## 2015-08-22 NOTE — H&P (Signed)
[facility] History and Physical  Chloe CRAIGE ZJQ:[id number] DOB: [birth year] DOA: 08/22/2015  Referring physician: ER PCP: [name], MD   Chief Complaint: Left facial numbness.  HPI: [name] is a 59 y.o. female  This is a 60 year old lady, diabetic, hypertensive, with 2 day history of occipital headaches which she usually gets followed by today of left sided facial numbness and left vision blurring, this started approximately 2 PM today. The headache associated was quite severe. There is no nausea or vomiting. There was no limb weakness associated with these symptoms. The patient has a history of seizure disorder but there was no evidence of this either. She does have a history of headaches which is being treated with Keppra. CT brain scan and MRI brain scan, (limited) did not show any evidence of new CVA. She is now being admitted for further management.   Review of Systems:  Apart from symptoms above, all systems negative.  Past Medical History  Diagnosis Date  . Diabetes mellitus   . Hypertension   . Asthma   . Overactive bladder   . High cholesterol   . Fibromyalgia   . Panic attacks   . Obstructive sleep apnea     Noncompliant with CPAP due to claustophobia.  . H/O cardiac catheterization     No significant CAD (only 20% LAD) 03/02/13 with normal LV function.  . Rectocele 05/21/2013  . OAB (overactive bladder) 05/21/2013  . Seizures     Had 1 with negative W/U 08/2014  . Chronic pelvic pain in female   . Headache   . Chronic leg pain     bilateral  . Chronic back pain    Past Surgical History  Procedure Laterality Date  . Abdominal hysterectomy    . Knee replacements      bilateral  . Back surgery    . Tonsillectomy    . Carpal tunnel release    . Ankle surgery    . Tubal ligation    . Left heart catheterization with coronary angiogram N/A 03/02/2013    Procedure: LEFT HEART CATHETERIZATION WITH CORONARY ANGIOGRAM;  Surgeon: Peter M Swaziland, MD;   Location: [facility] CATH LAB;  Service: Cardiovascular;  Laterality: N/A;  . Cardiac catheterization  02/2013    no significant coronary artery disease   Social History:  reports that she quit smoking about 40 years ago. Her smoking use included Cigarettes. She has a 2 pack-year smoking history. She has never used smokeless tobacco. She reports that she does not drink alcohol or use illicit drugs.  Allergies  Allergen Reactions  . Aspirin Nausea And Vomiting  . Codeine Rash    Family History  Problem Relation Age of Onset  . Heart attack Mother   . Diabetes Mother   . Hypertension Mother   . Sick sinus syndrome Brother     Pacemaker  . Congenital heart disease Brother   . Stroke Brother   . Coronary artery disease Brother   . Cancer Maternal Aunt     breast, liver,lung  . Cancer Cousin     leukemia    Prior to Admission medications   Medication Sig Start Date End Date Taking? Authorizing Provider  atorvastatin (LIPITOR) 40 MG tablet Take 40 mg by mouth at bedtime.  10/15/14  Yes Historical Provider, MD  imipramine (TOFRANIL) 50 MG tablet TAKE (1) TABLET BY MOUTH AT BEDTIME. 02/27/15  Yes Adline Potter, NP  levETIRAcetam (KEPPRA) 500 MG tablet Take 1 tablet (500 mg  total) by mouth 2 (two) times daily. 07/23/14  Yes Jerald Kief, MD  levothyroxine (SYNTHROID, LEVOTHROID) 75 MCG tablet Take 75 mcg by mouth daily before breakfast.   Yes Historical Provider, MD  metFORMIN (GLUCOPHAGE) 500 MG tablet Take 500 mg by mouth 2 (two) times daily with a meal.  03/26/14  Yes Historical Provider, MD  metoprolol (LOPRESSOR) 50 MG tablet Take 50 mg by mouth 2 (two) times daily.     Yes Historical Provider, MD  RESTASIS 0.05 % ophthalmic emulsion Place 1 vial into both eyes 2 (two) times daily. 08/14/15  Yes Historical Provider, MD  TOVIAZ 8 MG TB24 tablet TAKE ONE TABLET BY MOUTH ONCE DAILY. 06/30/15  Yes Adline Potter, NP  cephALEXin (KEFLEX) 500 MG capsule Take 1 capsule (500 mg total) by mouth 4  (four) times daily. Patient not taking: Reported on 06/05/2015 01/19/15   Samuel Jester, DO  cyclobenzaprine (FLEXERIL) 10 MG tablet Take 1 tablet (10 mg total) by mouth 2 (two) times daily as needed for muscle spasms. Patient not taking: Reported on 06/05/2015 05/04/15   Janne Napoleon, NP  HYDROmorphone (DILAUDID) 4 MG tablet Take 1 tablet (4 mg total) by mouth every 6 (six) hours as needed for severe pain. Patient not taking: Reported on 01/19/2015 01/10/15   Vanetta Mulders, MD  nystatin (MYCOSTATIN/NYSTOP) 100000 UNIT/GM POWD Apply 1 g topically 3 (three) times daily. Patient not taking: Reported on 01/19/2015 11/19/13   Adline Potter, NP  nystatin-triamcinolone ointment [facility]) Apply 1 application topically 3 (three) times daily. Patient not taking: Reported on 01/19/2015 11/04/14   Adline Potter, NP  oxyCODONE-acetaminophen (PERCOCET/ROXICET) 5-325 MG per tablet 1 or 2 tabs PO q6h prn pain Patient not taking: Reported on 06/05/2015 01/19/15   Samuel Jester, DO  PROAIR HFA 108 (90 BASE) MCG/ACT inhaler Inhale 2 puffs into the lungs every 4 (four) hours as needed. For shortness of breath 10/01/14   Historical Provider, MD   Physical Exam: Filed Vitals:   08/22/15 1830 08/22/15 1900 08/22/15 1954 08/22/15 2114  BP: 116/76 131/91 134/80 160/100  Pulse: 64 65 62 76  Temp:      TempSrc:      Resp: 19 17 16 16   Height:      Weight:      SpO2: 96% 95% 97% 98%    Wt Readings from Last 3 Encounters:  08/22/15 127.007 kg (280 lb)  08/22/15 127.007 kg (280 lb)  06/05/15 127.007 kg (280 lb)    General:  Appears somewhat drowsy, after she has been given some medications for her headaches. However, she does respond appropriately to my questions. Morbidly obese. Eyes: PERRL, normal lids, irises & conjunctiva ENT: grossly normal hearing, lips & tongue Neck: no LAD, masses or thyromegaly Cardiovascular: RRR, no m/r/g. No LE edema. Telemetry: SR, no arrhythmias  Respiratory: CTA  bilaterally, no w/r/r. Normal respiratory effort. Abdomen: soft, ntnd Skin: no rash or induration seen on limited exam Musculoskeletal: grossly normal tone BUE/BLE Psychiatric: grossly normal mood and affect, speech fluent and appropriate Neurologic: grossly non-focal.          Labs on Admission:  Basic Metabolic Panel:  Recent Labs Lab 08/22/15 1643 08/22/15 1704  NA 141 140  K 4.6 4.3  CL 105 101  CO2 30  --   GLUCOSE 118* 114*  BUN 15 14  CREATININE 0.90 0.80  CALCIUM 9.5  --    Liver Function Tests:  Recent Labs Lab 08/22/15 1643  AST 48*  ALT 39  ALKPHOS 68  BILITOT 0.6  PROT 7.3  ALBUMIN 4.2   No results for input(s): LIPASE, AMYLASE in the last 168 hours. No results for input(s): AMMONIA in the last 168 hours. CBC:  Recent Labs Lab 08/22/15 1643 08/22/15 1704  WBC 6.1  --   NEUTROABS 3.5  --   HGB 13.7 14.3  HCT 45.7 42.0  MCV 103.2*  --   PLT 160  --    Cardiac Enzymes: No results for input(s): CKTOTAL, CKMB, CKMBINDEX, TROPONINI in the last 168 hours.  BNP (last 3 results) No results for input(s): BNP in the last 8760 hours.  ProBNP (last 3 results) No results for input(s): PROBNP in the last 8760 hours.  CBG:  Recent Labs Lab 08/22/15 1623 08/22/15 1708  GLUCAP 127* 111*    Radiological Exams on Admission: Ct Head Wo Contrast  08/22/2015   CLINICAL DATA:  Code stroke. Sudden onset pain in the left side of the head. Left eye blurred vision. Left-sided numbness. History of seizure.  EXAM: CT HEAD WITHOUT CONTRAST  TECHNIQUE: Contiguous axial images were obtained from the base of the skull through the vertex without intravenous contrast.  COMPARISON:  07/21/2014  FINDINGS: There is moderate central and cortical atrophy. There is no intra or extra-axial fluid collection or mass lesion. The basilar cisterns and ventricles have a normal appearance. There is no CT evidence for acute infarction or hemorrhage. Bone windows are unremarkable.   IMPRESSION: 1. Mild atrophy. 2.  No evidence for acute intracranial abnormality. 3. Critical Value/emergent results were called by telephone at the time of interpretation on 08/22/2015 at 4:49 pm to Dr. Deretha Emory, who verbally acknowledged these results.   Electronically Signed   By: Norva Pavlov M.D.   On: 08/22/2015 16:50   Mr Brain Wo Contrast  08/22/2015   CLINICAL DATA:  New onset of facial numbing and blurred vision 2 hours ago. The examination had to be discontinued prior to completion due to patient claustrophobia and refusal of further imaging.  EXAM: MRI HEAD WITHOUT CONTRAST  TECHNIQUE: Multiplanar, multiecho pulse sequences of the brain and surrounding structures were obtained without intravenous contrast.  COMPARISON:  CT head without contrast from the same day. MRI brain 07/22/2014.  FINDINGS: The diffusion-weighted images demonstrate no evidence for acute or subacute infarction. No significant white matter disease is present. The ventricles are of normal size. No significant extra-axial fluid collection is present.  Midline structures demonstrate a relatively empty sella. The pituitary tissue is along the anterior surface of the sella. It is also conceivable that there is a cyst within the sella displacing the pituitary tissue anteriorly. Midline structures are otherwise unremarkable.  IMPRESSION: 1. Relatively empty sella versus is cystic lesion within the sella. Recommend repeat MRI of the brain and pituitary as the patient's condition allows. Sedation for claustrophobia may be useful. 2. Discontinued exam without evidence for acute disease.   Electronically Signed   By: Marin Roberts M.D.   On: 08/22/2015 20:08      Assessment/Plan   1. Left facial numbness. No clear evidence of CVA. MRI brain scan shows a possible empty sella versus a cystic lesion. Recommendation is for repeat MRI brain scan when the patient is less claustrophobic, probably with angiolytic medicines. Neurology  consultation. 2. Headaches. These are somewhat chronic. 3. Diabetes. Continue with home medications and sliding scale of insulin. 4. Hypertension. Stable. 5. Morbid obesity. 6. Seizure disorder. Continue with anticonvulsive medications.  Further recommendations will depend on  patient's hospital progress.   Code Status: Full code.   DVT Prophylaxis: Lovenox.  Family Communication: I discussed the plan with the patient at the bedside.   Disposition Plan: Home when medically stable.   Time spent: 45 minutes.  Wilson Singer [facility] Pager [phone].

## 2015-08-23 DIAGNOSIS — R51 Headache: Secondary | ICD-10-CM | POA: Diagnosis not present

## 2015-08-23 LAB — COMPREHENSIVE METABOLIC PANEL
ALT: 38 U/L (ref 14–54)
ANION GAP: 7 (ref 5–15)
AST: 48 U/L — ABNORMAL HIGH (ref 15–41)
Albumin: 3.8 g/dL (ref 3.5–5.0)
Alkaline Phosphatase: 65 U/L (ref 38–126)
BUN: 13 mg/dL (ref 6–20)
CHLORIDE: 107 mmol/L (ref 101–111)
CO2: 23 mmol/L (ref 22–32)
Calcium: 8.7 mg/dL — ABNORMAL LOW (ref 8.9–10.3)
Creatinine, Ser: 0.8 mg/dL (ref 0.44–1.00)
GFR calc non Af Amer: >60 mL/min (ref >60)
Glucose, Bld: 185 mg/dL — ABNORMAL HIGH (ref 65–99)
POTASSIUM: 4 mmol/L (ref 3.5–5.1)
SODIUM: 137 mmol/L (ref 135–145)
Total Bilirubin: 0.7 mg/dL (ref 0.3–1.2)
Total Protein: 6.8 g/dL (ref 6.5–8.1)

## 2015-08-23 LAB — GLUCOSE, CAPILLARY
GLUCOSE-CAPILLARY: 154 mg/dL — AB (ref 65–99)
Glucose-Capillary: 180 mg/dL — ABNORMAL HIGH (ref 65–99)

## 2015-08-23 LAB — CBC
HCT: 41.8 % (ref 36.0–46.0)
HEMOGLOBIN: 13.9 g/dL (ref 12.0–15.0)
MCH: 29.8 pg (ref 26.0–34.0)
MCHC: 33.3 g/dL (ref 30.0–36.0)
MCV: 89.7 fL (ref 78.0–100.0)
Platelets: 188 10*3/uL (ref 150–400)
RBC: 4.66 MIL/uL (ref 3.87–5.11)
RDW: 12.3 % (ref 11.5–15.5)
WBC: 4.5 10*3/uL (ref 4.0–10.5)

## 2015-08-23 LAB — VITAMIN B12: Vitamin B-12: 221 pg/mL (ref 180–914)

## 2015-08-23 MED ORDER — DIPHENHYDRAMINE HCL 50 MG/ML IJ SOLN
25.0000 mg | Freq: Once | INTRAMUSCULAR | Status: AC
  Administered 2015-08-23: 25 mg via INTRAVENOUS
  Filled 2015-08-23: qty 1

## 2015-08-23 MED ORDER — METOCLOPRAMIDE HCL 5 MG/ML IJ SOLN
10.0000 mg | Freq: Once | INTRAMUSCULAR | Status: AC
  Administered 2015-08-23: 10 mg via INTRAVENOUS
  Filled 2015-08-23: qty 2

## 2015-08-23 NOTE — Discharge Summary (Signed)
Physician Discharge Summary  [name]:[id number] DOB: Jul 11, 1956 DOA: 08/22/2015  PCP: [name], MD  Admit date: 08/22/2015 Discharge date: 08/23/2015  Time spent: 45 minutes  Recommendations for Outpatient Follow-up:  -Will be discharged home today. -Advised to follow up with PCP and neurologist as scheduled for continued management of chronic HAs.   Discharge Diagnoses:  Active Problems:   Hypertension   Diabetes   Morbid obesity   Seizure   Obstructive sleep apnea   Headache, occipital   Left facial numbness   Discharge Condition: Stable and improved  Filed Weights   08/22/15 1620  Weight: 127.007 kg (280 lb)    History of present illness:  As per Dr. Karilyn Cota 9/9: [name] is a 59 y.o. female  This is a 59 year old lady, diabetic, hypertensive, with 2 day history of occipital headaches which she usually gets followed by today of left sided facial numbness and left vision blurring, this started approximately 2 PM today. The headache associated was quite severe. There is no nausea or vomiting. There was no limb weakness associated with these symptoms. The patient has a history of seizure disorder but there was no evidence of this either. She does have a history of headaches which is being treated with Keppra. CT brain scan and MRI brain scan, (limited) did not show any evidence of new CVA. She is now being admitted for further management.   Hospital Course:   Chronic HAs -Improved today with benadryl/reglan combo. -Will Dc today. -Advised to continue to use OTC meds for HA and to follow up with neurologist as scheduled to discuss chronic HA management strategies. -MRI relatively normal (see below for details).  Rest of chronic medical conditions have been stable and home medications have not changed.  Procedures:  None   Consultations:  None  Discharge Instructions  Discharge Instructions    Increase activity slowly    Complete by:  As  directed             Medication List    STOP taking these medications        cephALEXin 500 MG capsule  Commonly known as:  KEFLEX     cyclobenzaprine 10 MG tablet  Commonly known as:  FLEXERIL     HYDROmorphone 4 MG tablet  Commonly known as:  DILAUDID     nystatin 100000 UNIT/GM Powd     nystatin-triamcinolone ointment  Commonly known as:  MYCOLOG     oxyCODONE-acetaminophen 5-325 MG per tablet  Commonly known as:  PERCOCET/ROXICET      TAKE these medications        atorvastatin 40 MG tablet  Commonly known as:  LIPITOR  Take 40 mg by mouth at bedtime.     imipramine 50 MG tablet  Commonly known as:  TOFRANIL  TAKE (1) TABLET BY MOUTH AT BEDTIME.     levETIRAcetam 500 MG tablet  Commonly known as:  KEPPRA  Take 1 tablet (500 mg total) by mouth 2 (two) times daily.     levothyroxine 75 MCG tablet  Commonly known as:  SYNTHROID, LEVOTHROID  Take 75 mcg by mouth daily before breakfast.     metFORMIN 500 MG tablet  Commonly known as:  GLUCOPHAGE  Take 500 mg by mouth 2 (two) times daily with a meal.     metoprolol 50 MG tablet  Commonly known as:  LOPRESSOR  Take 50 mg by mouth 2 (two) times daily.     PROAIR HFA 108 (  90 BASE) MCG/ACT inhaler  Generic drug:  albuterol  Inhale 2 puffs into the lungs every 4 (four) hours as needed. For shortness of breath     RESTASIS 0.05 % ophthalmic emulsion  Generic drug:  cycloSPORINE  Place 1 vial into both eyes 2 (two) times daily.     TOVIAZ 8 MG Tb24 tablet  Generic drug:  fesoterodine  TAKE ONE TABLET BY MOUTH ONCE DAILY.       Allergies  Allergen Reactions  . Aspirin Nausea And Vomiting  . Codeine Rash       Follow-up Information    Follow up with [name], MD. Schedule an appointment as soon as possible for a visit in 2 weeks.   Specialty:  Internal Medicine   Contact information:    [address]  [place] [address] [phone]        The results of significant diagnostics from this  hospitalization (including imaging, microbiology, ancillary and laboratory) are listed below for reference.    Significant Diagnostic Studies: Ct Head Wo Contrast  08/22/2015   CLINICAL DATA:  Code stroke. Sudden onset pain in the left side of the head. Left eye blurred vision. Left-sided numbness. History of seizure.  EXAM: CT HEAD WITHOUT CONTRAST  TECHNIQUE: Contiguous axial images were obtained from the base of the skull through the vertex without intravenous contrast.  COMPARISON:  07/21/2014  FINDINGS: There is moderate central and cortical atrophy. There is no intra or extra-axial fluid collection or mass lesion. The basilar cisterns and ventricles have a normal appearance. There is no CT evidence for acute infarction or hemorrhage. Bone windows are unremarkable.  IMPRESSION: 1. Mild atrophy. 2.  No evidence for acute intracranial abnormality. 3. Critical Value/emergent results were called by telephone at the time of interpretation on 08/22/2015 at 4:49 pm to Dr. Deretha Emory, who verbally acknowledged these results.   Electronically Signed   By: Norva Pavlov M.D.   On: 08/22/2015 16:50   Mr Brain Wo Contrast  08/22/2015   CLINICAL DATA:  New onset of facial numbing and blurred vision 2 hours ago. The examination had to be discontinued prior to completion due to patient claustrophobia and refusal of further imaging.  EXAM: MRI HEAD WITHOUT CONTRAST  TECHNIQUE: Multiplanar, multiecho pulse sequences of the brain and surrounding structures were obtained without intravenous contrast.  COMPARISON:  CT head without contrast from the same day. MRI brain 07/22/2014.  FINDINGS: The diffusion-weighted images demonstrate no evidence for acute or subacute infarction. No significant white matter disease is present. The ventricles are of normal size. No significant extra-axial fluid collection is present.  Midline structures demonstrate a relatively empty sella. The pituitary tissue is along the anterior surface of  the sella. It is also conceivable that there is a cyst within the sella displacing the pituitary tissue anteriorly. Midline structures are otherwise unremarkable.  IMPRESSION: 1. Relatively empty sella versus is cystic lesion within the sella. Recommend repeat MRI of the brain and pituitary as the patient's condition allows. Sedation for claustrophobia may be useful. 2. Discontinued exam without evidence for acute disease.   Electronically Signed   By: Marin Roberts M.D.   On: 08/22/2015 20:08    Microbiology: No results found for this or any previous visit (from the past 240 hour(s)).   Labs: Basic Metabolic Panel:  Recent Labs Lab 08/22/15 1643 08/22/15 1704 08/22/15 2134 08/23/15 0504  NA 141 140  --  137  K 4.6 4.3  --  4.0  CL 105 101  --  107  CO2 30  --   --  23  GLUCOSE 118* 114*  --  185*  BUN 15 14  --  13  CREATININE 0.90 0.80 0.85 0.80  CALCIUM 9.5  --   --  8.7*   Liver Function Tests:  Recent Labs Lab 08/22/15 1643 08/23/15 0504  AST 48* 48*  ALT 39 38  ALKPHOS 68 65  BILITOT 0.6 0.7  PROT 7.3 6.8  ALBUMIN 4.2 3.8   No results for input(s): LIPASE, AMYLASE in the last 168 hours. No results for input(s): AMMONIA in the last 168 hours. CBC:  Recent Labs Lab 08/22/15 1643 08/22/15 1704 08/22/15 2134 08/23/15 0504  WBC 6.1  --  5.1 4.5  NEUTROABS 3.5  --   --   --   HGB 13.7 14.3 13.0 13.9  HCT 45.7 42.0 39.5 41.8  MCV 103.2*  --  91.0 89.7  PLT 160  --  178 188   Cardiac Enzymes: No results for input(s): CKTOTAL, CKMB, CKMBINDEX, TROPONINI in the last 168 hours. BNP: BNP (last 3 results) No results for input(s): BNP in the last 8760 hours.  ProBNP (last 3 results) No results for input(s): PROBNP in the last 8760 hours.  CBG:  Recent Labs Lab 08/22/15 1623 08/22/15 1708 08/22/15 2243 08/23/15 0730  GLUCAP 127* 111* 148* 154*       Signed:  HERNANDEZ ACOSTA,ESTELA  [facility] Pager: [phone] 08/23/2015, 10:57  AM

## 2015-08-25 LAB — FOLATE RBC
FOLATE, HEMOLYSATE: 465.2 ng/mL
Folate, RBC: 1205 ng/mL (ref >498)
Hematocrit: 38.6 % (ref 34.0–46.6)

## 2015-09-01 ENCOUNTER — Other Ambulatory Visit: Payer: Self-pay | Admitting: Adult Health

## 2015-09-08 ENCOUNTER — Ambulatory Visit (INDEPENDENT_AMBULATORY_CARE_PROVIDER_SITE_OTHER): Payer: Commercial Managed Care - HMO | Admitting: Neurology

## 2015-09-08 ENCOUNTER — Encounter: Payer: Self-pay | Admitting: Neurology

## 2015-09-08 VITALS — BP 128/80 | HR 66 | Resp 18 | Ht 67.0 in | Wt 286.0 lb

## 2015-09-08 DIAGNOSIS — R569 Unspecified convulsions: Secondary | ICD-10-CM | POA: Diagnosis not present

## 2015-09-08 DIAGNOSIS — M5481 Occipital neuralgia: Secondary | ICD-10-CM | POA: Diagnosis not present

## 2015-09-08 DIAGNOSIS — H4710 Unspecified papilledema: Secondary | ICD-10-CM | POA: Diagnosis not present

## 2015-09-08 DIAGNOSIS — R51 Headache: Secondary | ICD-10-CM | POA: Diagnosis not present

## 2015-09-08 DIAGNOSIS — M542 Cervicalgia: Secondary | ICD-10-CM

## 2015-09-08 DIAGNOSIS — R208 Other disturbances of skin sensation: Secondary | ICD-10-CM | POA: Diagnosis not present

## 2015-09-08 DIAGNOSIS — G4733 Obstructive sleep apnea (adult) (pediatric): Secondary | ICD-10-CM

## 2015-09-08 DIAGNOSIS — R200 Anesthesia of skin: Secondary | ICD-10-CM

## 2015-09-08 DIAGNOSIS — R519 Headache, unspecified: Secondary | ICD-10-CM

## 2015-09-08 MED ORDER — GABAPENTIN 300 MG PO CAPS: ORAL_CAPSULE | ORAL | Status: DC

## 2015-09-08 MED ORDER — LEVETIRACETAM 1000 MG PO TABS: 1000.0000 mg | ORAL_TABLET | Freq: Two times a day (BID) | ORAL | Status: DC

## 2015-09-08 NOTE — Progress Notes (Signed)
[facility]  PATIENT: [name] DOB: 10-01-1956  REFERRING DOCTOR OR PCP:  Shelva Majestic SOURCE: patient and records form EMR and Dr. [name]  _________________________________   HISTORICAL  CHIEF COMPLAINT:  Chief Complaint  Patient presents with  . Seizures    Denies sz. activity since last ov--sts. she take Keppra as rx'd and denies missed doses.  Sts. she continues to have constant h/a, and about 2 weeks ago, h/a became severe with left sided facial numbness.  She was seen at the ED at [facility], and r/o for cva--sts CT head was ok.  She was not able to tolerate an MRI due to claustrophobia.  Sts. she got 1-2 days of relief from onb given at last ov.  She reports noncompliance with CPAP due to claustrophobia./fim  . Headache  . Sleep Apnea    HISTORY OF PRESENT ILLNESS:  [name] is a 59 year old woman who has a generalized tonic-clonic seizure last year with right-sided neck and headache. He, she has a history of obstructive sleep apnea.  Seizure: Back in August 2015, she had generalized tonic-clonic seizure activity that was witnessed by her husband. She has had no seizures since that time.   She continues on Keppra 500 mg po bid.  An MRI of the brain at that time was essentially normal. An EEG was performed and showed 2 runs of generalized spike wave activity, consistent with a generalized seizure disorder. No family history of seizures (doe snot know father's hx).    She has not had head trauma in the past. She had a normal childhood development. She has not had any meningitis or encephalitis.  Neck pain/headache: She reports neck/occiput that radiates forward.   Pain is chronic and low level most of the day but intensifies with bright lights and noises.    Moving her head worsens the pain.   No N/V.   When pain worsens, she will get blurry vision.   Occasionally she has some blurry or double vision. If she stands up she will sometimes get lightheaded.    She is on imipramine 50 mg but it has not helped.   Two weeks ago pain was very severe and spread to her left side.    She had left blurry vision and some left numbness.       Obstructive sleep apnea: About 4 or 5 years ago she was diagnosed with OSA with an overnight sleep study. She was started on CPAP by Dr. Marvel Plan.    However, she has trouble tolerating the mask.    She used to use it more.   She is claustophobic.    She reports fatigue sets of daytime sleepiness and we discussed that we will need to get her last download to determine if her settings need to be adjusted.  REVIEW OF SYSTEMS: Constitutional: No fevers, chills, sweats, or change in appetite Eyes: No visual changes, double vision, eye pain Ear, nose and throat: No hearing loss, ear pain, nasal congestion, sore throat Cardiovascular: No chest pain, palpitations Respiratory: No shortness of breath at rest or with exertion.   No wheezes GastrointestinaI: No nausea, vomiting, diarrhea, abdominal pain, fecal incontinence Genitourinary: No dysuria, urinary retention or frequency.  No nocturia. Musculoskeletal: No neck pain, back pain Integumentary: No rash, pruritus, skin lesions Neurological: as above Psychiatric: No depression at this time.  No anxiety Endocrine: No palpitations, diaphoresis, change in appetite, change in weigh or increased thirst Hematologic/Lymphatic: No anemia, purpura, petechiae. Allergic/Immunologic: No itchy/runny  eyes, nasal congestion, recent allergic reactions, rashes  ALLERGIES: Allergies  Allergen Reactions  . Aspirin Nausea And Vomiting  . Codeine Rash    HOME MEDICATIONS:  Current outpatient prescriptions:  .  atorvastatin (LIPITOR) 40 MG tablet, Take 40 mg by mouth at bedtime. , Disp: , Rfl:  .  imipramine (TOFRANIL) 50 MG tablet, TAKE (1) TABLET BY MOUTH AT BEDTIME., Disp: 30 tablet, Rfl: 3 .  levETIRAcetam (KEPPRA) 500 MG tablet, Take 1 tablet (500 mg total) by mouth 2 (two) times  daily., Disp: 60 tablet, Rfl: 0 .  levothyroxine (SYNTHROID, LEVOTHROID) 75 MCG tablet, Take 75 mcg by mouth daily before breakfast., Disp: , Rfl:  .  metFORMIN (GLUCOPHAGE) 500 MG tablet, Take 500 mg by mouth 2 (two) times daily with a meal. , Disp: , Rfl:  .  metoprolol (LOPRESSOR) 50 MG tablet, Take 50 mg by mouth 2 (two) times daily.  , Disp: , Rfl:  .  PROAIR HFA 108 (90 BASE) MCG/ACT inhaler, Inhale 2 puffs into the lungs every 4 (four) hours as needed. For shortness of breath, Disp: , Rfl:  .  RESTASIS 0.05 % ophthalmic emulsion, Place 1 vial into both eyes 2 (two) times daily., Disp: , Rfl:  .  TOVIAZ 8 MG TB24 tablet, TAKE ONE TABLET BY MOUTH ONCE DAILY., Disp: 30 tablet, Rfl: 6  PAST MEDICAL HISTORY: Past Medical History  Diagnosis Date  . Diabetes mellitus   . Hypertension   . Asthma   . Overactive bladder   . High cholesterol   . Fibromyalgia   . Panic attacks   . Obstructive sleep apnea     Noncompliant with CPAP due to claustophobia.  . H/O cardiac catheterization     No significant CAD (only 20% LAD) 03/02/13 with normal LV function.  . Rectocele 05/21/2013  . OAB (overactive bladder) 05/21/2013  . Seizures     Had 1 with negative W/U 08/2014  . Chronic pelvic pain in female   . Headache   . Chronic leg pain     bilateral  . Chronic back pain     PAST SURGICAL HISTORY: Past Surgical History  Procedure Laterality Date  . Abdominal hysterectomy    . Knee replacements      bilateral  . Back surgery    . Tonsillectomy    . Carpal tunnel release    . Ankle surgery    . Tubal ligation    . Left heart catheterization with coronary angiogram N/A 03/02/2013    Procedure: LEFT HEART CATHETERIZATION WITH CORONARY ANGIOGRAM;  Surgeon: Peter M Swaziland, MD;  Location: [facility] CATH LAB;  Service: Cardiovascular;  Laterality: N/A;  . Cardiac catheterization  02/2013    no significant coronary artery disease    FAMILY HISTORY: Family History  Problem Relation Age of Onset  .  Heart attack Mother   . Diabetes Mother   . Hypertension Mother   . Sick sinus syndrome Brother     Pacemaker  . Congenital heart disease Brother   . Stroke Brother   . Coronary artery disease Brother   . Cancer Maternal Aunt     breast, liver,lung  . Cancer Cousin     leukemia    SOCIAL HISTORY:  Social History   Social History  . Marital Status: Married    Spouse Name: N/A  . Number of Children: N/A  . Years of Education: N/A   Occupational History  . Not on file.   Social History Main Topics  .  Smoking status: Former Smoker -- 1.00 packs/day for 2 years    Types: Cigarettes    Quit date: 12/13/1974  . Smokeless tobacco: Never Used  . Alcohol Use: No  . Drug Use: No  . Sexual Activity: Not Currently    Birth Control/ Protection: Surgical   Other Topics Concern  . Not on file   Social History Narrative     PHYSICAL EXAM  Filed Vitals:   09/08/15 0845  BP: 128/80  Pulse: 66  Resp: 18  Height: 5\' 7"  (1.702 m)  Weight: 286 lb (129.729 kg)    Body mass index is 44.78 kg/(m^2).   General: The patient is an obese woman in no acute distress  Eyes:  Funduscopic exam shows mild bilateral papilledema and normal retinal vessels.  Neck: The neck is supple, no carotid bruits are noted.  The neck is very tender over the right greater occipital nerve / splenius capitus muscle   Neurologic Exam  Mental status: The patient is alert and oriented x 3 at the time of the examination. The patient has apparent normal recent and remote memory, with an apparently normal attention span and concentration ability.   Speech is normal.  Cranial nerves: Extraocular movements are full. Pupils are equal, round, and reactive to light and accomodation.    Facial symmetry is present. There is good facial sensation to soft touch bilaterally.Facial strength is normal.  Trapezius and sternocleidomastoid strength is normal. No dysarthria is noted.    No obvious hearing deficits are  noted.  Motor:  Muscle bulk is normal.   Tone is normal. Strength is  5 / 5 in all 4 extremities.   Sensory: Sensory testing is intact to soft touch but decreased vibration sensation in toes.  Coordination: Cerebellar testing reveals good finger-nose-finger and heel-to-shin bilaterally.  Gait and station: Station is normal.   Gait is normal. Tandem gait is normal. Romberg is negative.   Reflexes: Deep tendon reflexes are symmetric and normal bilaterally.    \   DIAGNOSTIC DATA (LABS, IMAGING, TESTING) - I reviewed patient records, labs, notes, testing and imaging myself where available.  Lab Results  Component Value Date   WBC 4.5 08/23/2015   HGB 13.9 08/23/2015   HCT 41.8 08/23/2015   MCV 89.7 08/23/2015   PLT 188 08/23/2015      Component Value Date/Time   NA 137 08/23/2015 0504   K 4.0 08/23/2015 0504   CL 107 08/23/2015 0504   CO2 23 08/23/2015 0504   GLUCOSE 185* 08/23/2015 0504   BUN 13 08/23/2015 0504   CREATININE 0.80 08/23/2015 0504   CALCIUM 8.7* 08/23/2015 0504   PROT 6.8 08/23/2015 0504   ALBUMIN 3.8 08/23/2015 0504   AST 48* 08/23/2015 0504   ALT 38 08/23/2015 0504   ALKPHOS 65 08/23/2015 0504   BILITOT 0.7 08/23/2015 0504   GFRNONAA >60 08/23/2015 0504   GFRAA >60 08/23/2015 0504   Lab Results  Component Value Date   CHOL 193 07/22/2014   HDL 37* 07/22/2014   LDLCALC 111* 07/22/2014   TRIG 226* 07/22/2014   CHOLHDL 5.2 07/22/2014   Lab Results  Component Value Date   HGBA1C 6.5* 07/22/2014      ASSESSMENT AND PLAN  Seizure  Headache, occipital  Occipital neuralgia of right side  Neck pain  Left facial numbness  Obstructive sleep apnea  Papilledema    1.   Increase Keppra.   She has not had any further seizures since starting Keppra 2.  If pseudotumor is confirmed, consider change to TPM as it may also reduce CSF pressure and would hopefully be just as effective for her seizures. 3.  LP to assess for Pseudotumor cerebri.    She will also follow up with her ophthalmologist.     She will return to see me in 2 months or sooner if there are new or worsening neurologic symptoms.   Richard A. Epimenio Foot, MD, PhD 09/08/2015, 9:00 AM Certified in Neurology, Clinical Neurophysiology, Sleep Medicine, Pain Medicine and Neuroimaging  [facility] [address] [place], [place] [address] [phone]

## 2015-09-22 ENCOUNTER — Ambulatory Visit
Admission: RE | Admit: 2015-09-22 | Discharge: 2015-09-22 | Disposition: A | Discharge status: Home / Self Care | Payer: Commercial Managed Care - HMO | Source: Ambulatory Visit | Attending: Neurology | Admitting: Neurology

## 2015-09-22 ENCOUNTER — Other Ambulatory Visit: Payer: Self-pay | Admitting: Neurology

## 2015-09-22 DIAGNOSIS — H4710 Unspecified papilledema: Secondary | ICD-10-CM

## 2015-09-22 DIAGNOSIS — R519 Headache, unspecified: Secondary | ICD-10-CM

## 2015-09-22 DIAGNOSIS — R51 Headache: Principal | ICD-10-CM

## 2015-09-22 LAB — CSF CELL COUNT WITH DIFFERENTIAL
RBC Count, CSF: 0 cu mm
TUBE #: 3
WBC, CSF: 1 cu mm (ref 0–5)

## 2015-09-22 LAB — PROTEIN, CSF: TOTAL PROTEIN, CSF: 32 mg/dL (ref 15–45)

## 2015-09-22 LAB — GLUCOSE, CSF: GLUCOSE CSF: 82 mg/dL — AB (ref 43–76)

## 2015-09-22 NOTE — Discharge Instructions (Signed)

## 2015-09-23 ENCOUNTER — Telehealth: Payer: Self-pay | Admitting: *Deleted

## 2015-09-23 LAB — CRYPTOCOCCAL AG, LTX SCR RFLX TITER: CRYPTOCOCCAL AG SCREEN: NOT DETECTED

## 2015-09-23 MED ORDER — ACETAZOLAMIDE 250 MG PO TABS: ORAL_TABLET | ORAL | Status: DC

## 2015-09-23 NOTE — Telephone Encounter (Signed)
-----   Message from Asa Lente, MD sent at 09/22/2015  5:05 PM EDT ----- Please let patient know that the spinal fluid pressure was elevated. The lumbar puncture should help for a while but we need to add another medication, acetazolamide (Diamox) 500 mg by mouth daily #30 #5.      I also would like her to see  Ophthalmology and get formal visual field testing.

## 2015-09-23 NOTE — Telephone Encounter (Signed)
I have spoken with [name] this morning, and per RAS, have advised that csf pressure was elevated at the time of the LP, that the LP should help for a while, but he would like to add Diamox 500mg  po qd to help further.  She is agreeable with this plan--Rx. escribed to Apothe[facility]ary per her request/fim

## 2015-10-02 ENCOUNTER — Emergency Department (HOSPITAL_COMMUNITY)
Admission: EM | Admit: 2015-10-02 | Discharge: 2015-10-02 | Disposition: A | Discharge status: Home / Self Care | Payer: Commercial Managed Care - HMO | Attending: Emergency Medicine | Admitting: Emergency Medicine

## 2015-10-02 ENCOUNTER — Encounter (HOSPITAL_COMMUNITY): Payer: Self-pay | Admitting: *Deleted

## 2015-10-02 DIAGNOSIS — Z87891 Personal history of nicotine dependence: Secondary | ICD-10-CM | POA: Diagnosis not present

## 2015-10-02 DIAGNOSIS — Y9389 Activity, other specified: Secondary | ICD-10-CM | POA: Insufficient documentation

## 2015-10-02 DIAGNOSIS — S61212A Laceration without foreign body of right middle finger without damage to nail, initial encounter: Secondary | ICD-10-CM

## 2015-10-02 DIAGNOSIS — I10 Essential (primary) hypertension: Secondary | ICD-10-CM | POA: Insufficient documentation

## 2015-10-02 DIAGNOSIS — J45909 Unspecified asthma, uncomplicated: Secondary | ICD-10-CM | POA: Insufficient documentation

## 2015-10-02 DIAGNOSIS — Z79899 Other long term (current) drug therapy: Secondary | ICD-10-CM | POA: Insufficient documentation

## 2015-10-02 DIAGNOSIS — E079 Disorder of thyroid, unspecified: Secondary | ICD-10-CM | POA: Diagnosis not present

## 2015-10-02 DIAGNOSIS — W290XXA Contact with powered kitchen appliance, initial encounter: Secondary | ICD-10-CM | POA: Insufficient documentation

## 2015-10-02 DIAGNOSIS — E7800 Pure hypercholesterolemia, unspecified: Secondary | ICD-10-CM | POA: Insufficient documentation

## 2015-10-02 DIAGNOSIS — E119 Type 2 diabetes mellitus without complications: Secondary | ICD-10-CM | POA: Insufficient documentation

## 2015-10-02 DIAGNOSIS — Y998 Other external cause status: Secondary | ICD-10-CM | POA: Diagnosis not present

## 2015-10-02 DIAGNOSIS — G40909 Epilepsy, unspecified, not intractable, without status epilepticus: Secondary | ICD-10-CM | POA: Insufficient documentation

## 2015-10-02 DIAGNOSIS — Z8659 Personal history of other mental and behavioral disorders: Secondary | ICD-10-CM | POA: Diagnosis not present

## 2015-10-02 DIAGNOSIS — G8929 Other chronic pain: Secondary | ICD-10-CM | POA: Insufficient documentation

## 2015-10-02 DIAGNOSIS — M797 Fibromyalgia: Secondary | ICD-10-CM | POA: Diagnosis not present

## 2015-10-02 DIAGNOSIS — Y9289 Other specified places as the place of occurrence of the external cause: Secondary | ICD-10-CM | POA: Diagnosis not present

## 2015-10-02 DIAGNOSIS — Z9889 Other specified postprocedural states: Secondary | ICD-10-CM | POA: Insufficient documentation

## 2015-10-02 MED ORDER — TETANUS-DIPHTH-ACELL PERTUSSIS 5-2.5-18.5 LF-MCG/0.5 IM SUSP
0.5000 mL | Freq: Once | INTRAMUSCULAR | Status: AC
  Administered 2015-10-02: 0.5 mL via INTRAMUSCULAR
  Filled 2015-10-02: qty 0.5

## 2015-10-02 MED ORDER — LIDOCAINE HCL (PF) 1 % IJ SOLN
5.0000 mL | Freq: Once | INTRAMUSCULAR | Status: AC
  Administered 2015-10-02: 5 mL
  Filled 2015-10-02: qty 5

## 2015-10-02 NOTE — ED Provider Notes (Signed)
CSN: [id number]     Arrival date & time 10/02/15  1750 History   First MD Initiated Contact with Patient 10/02/15 1758     Chief Complaint  Patient presents with  . Laceration     (Consider location/radiation/quality/duration/timing/severity/associated sxs/prior Treatment) Patient is a 59 y.o. female presenting with skin laceration. The history is provided by the patient.  Laceration Location:  Finger Finger laceration location:  R middle finger Depth:  Through dermis Quality: straight   Bleeding: controlled   Laceration mechanism:  Metal edge  [name] is a 59 y.o. female who presents to the ED with a laceration to the right middle finger. She states she just got a new blender and had washed it in the dish washer. When she reached in to get it out she cut her finger on the blender blade. She denies other injuries.   Past Medical History  Diagnosis Date  . Diabetes mellitus   . Hypertension   . Asthma   . Overactive bladder   . High cholesterol   . Fibromyalgia   . Panic attacks   . Obstructive sleep apnea     Noncompliant with CPAP due to claustophobia.  . H/O cardiac catheterization     No significant CAD (only 20% LAD) 03/02/13 with normal LV function.  . Rectocele 05/21/2013  . OAB (overactive bladder) 05/21/2013  . Chronic pelvic pain in female   . Headache   . Chronic leg pain     bilateral  . Chronic back pain   . Seizures (HCC)     Had 1 with negative W/U 08/2014  . Thyroid disease    Past Surgical History  Procedure Laterality Date  . Abdominal hysterectomy    . Knee replacements      bilateral  . Back surgery    . Tonsillectomy    . Carpal tunnel release    . Ankle surgery    . Tubal ligation    . Left heart catheterization with coronary angiogram N/A 03/02/2013    Procedure: LEFT HEART CATHETERIZATION WITH CORONARY ANGIOGRAM;  Surgeon: Peter M Swaziland, MD;  Location: [facility] CATH LAB;  Service: Cardiovascular;  Laterality: N/A;  . Cardiac catheterization   02/2013    no significant coronary artery disease   Family History  Problem Relation Age of Onset  . Heart attack Mother   . Diabetes Mother   . Hypertension Mother   . Sick sinus syndrome Brother     Pacemaker  . Congenital heart disease Brother   . Stroke Brother   . Coronary artery disease Brother   . Cancer Maternal Aunt     breast, liver,lung  . Cancer Cousin     leukemia   Social History  Substance Use Topics  . Smoking status: Former Smoker -- 1.00 packs/day for 2 years    Types: Cigarettes    Quit date: 12/13/1974  . Smokeless tobacco: Never Used  . Alcohol Use: No   OB History    Gravida Para Term Preterm AB TAB SAB Ectopic Multiple Living   2 2 2       2      Review of Systems Negative except as stated in HPI   Allergies  Aspirin and Codeine  Home Medications   Prior to Admission medications   Medication Sig Start Date End Date Taking? Authorizing Provider  acetaZOLAMIDE (DIAMOX) 250 MG tablet Take 2 tablets by mouth daily. 09/23/15   Asa Lente, MD  acetaZOLAMIDE (DIAMOX) 500 MG capsule  Take 500 mg by mouth daily. 09/23/15   Historical Provider, MD  atorvastatin (LIPITOR) 40 MG tablet Take 40 mg by mouth at bedtime.  10/15/14   Historical Provider, MD  gabapentin (NEURONTIN) 300 MG capsule One or two po at night 09/08/15   Asa Lente, MD  imipramine (TOFRANIL) 50 MG tablet TAKE (1) TABLET BY MOUTH AT BEDTIME. 09/01/15   Adline Potter, NP  levETIRAcetam (KEPPRA) 1000 MG tablet Take 1 tablet (1,000 mg total) by mouth 2 (two) times daily. 09/08/15   Asa Lente, MD  levothyroxine (SYNTHROID, LEVOTHROID) 75 MCG tablet Take 75 mcg by mouth daily before breakfast.    Historical Provider, MD  metFORMIN (GLUCOPHAGE) 500 MG tablet Take 500 mg by mouth 2 (two) times daily with a meal.  03/26/14   Historical Provider, MD  metoprolol (LOPRESSOR) 50 MG tablet Take 50 mg by mouth 2 (two) times daily.      Historical Provider, MD  PROAIR HFA 108 (90 BASE)  MCG/ACT inhaler Inhale 2 puffs into the lungs every 4 (four) hours as needed. For shortness of breath 10/01/14   Historical Provider, MD  RESTASIS 0.05 % ophthalmic emulsion Place 1 vial into both eyes 2 (two) times daily. 08/14/15   Historical Provider, MD  TOVIAZ 8 MG TB24 tablet TAKE ONE TABLET BY MOUTH ONCE DAILY. 06/30/15   Adline Potter, NP   BP 125/65 mmHg  Pulse 98  Temp(Src) 97.8 F (36.6 C) (Oral)  Resp 16  Ht 5\' 7"  (1.702 m)  Wt 281 lb (127.461 kg)  BMI 44.00 kg/m2  SpO2 98% Physical Exam  Constitutional: She is oriented to person, place, and time. She appears well-developed and well-nourished. No distress.  HENT:  Head: Normocephalic and atraumatic.  Eyes: EOM are normal.  Neck: Normal range of motion. Neck supple.  Cardiovascular: Normal rate.   Pulmonary/Chest: Effort normal.  Musculoskeletal: Normal range of motion.       Right hand: She exhibits tenderness and laceration. She exhibits normal range of motion, normal capillary refill, no deformity and no swelling. Normal sensation noted. Normal strength noted.       Hands: Radial pulse 2+, good touch sensation.  Neurological: She is alert and oriented to person, place, and time. No cranial nerve deficit.  Skin: Skin is warm and dry.  Psychiatric: She has a normal mood and affect. Her behavior is normal.  Nursing note and vitals reviewed.   ED Course  . Laceration Repair Date/Time: 10/02/2015 6:34 PM Performed by: 10/04/2015 Authorized by: Janne Napoleon Consent: Verbal consent obtained. Risks and benefits: risks, benefits and alternatives were discussed Consent given by: patient Patient understanding: patient states understanding of the procedure being performed Required items: required blood products, implants, devices, and special equipment available Patient identity confirmed: verbally with patient Body area: upper extremity Location details: right long finger Laceration length: 1 cm Tendon  involvement: none Nerve involvement: none Vascular damage: no Anesthesia: local infiltration Local anesthetic: lidocaine 1% without epinephrine Anesthetic total: 1 ml Patient sedated: no Preparation: Patient was prepped and draped in the usual sterile fashion. Irrigation solution: saline Irrigation method: syringe Amount of cleaning: standard Debridement: none Degree of undermining: none Skin closure: 5-0 Prolene Number of sutures: 2 Technique: simple Approximation: close Approximation difficulty: simple Dressing: pressure dressing Patient tolerance: Patient tolerated the procedure well with no immediate complications Comments: Tetanus updated    MDM  59 y.o. female with laceration to the right middle finger stable for d/c without focal  neuro deficits. She will follow up in one week for suture removal or sooner for any problems.   Final diagnoses:  Laceration of right middle finger w/o foreign body w/o damage to nail, initial encounter       Janne Napoleon, NP 10/02/15 2059  Samuel Jester, DO 10/07/15 1232

## 2015-10-02 NOTE — ED Notes (Signed)
Patient cut middle finger of right hand across the knuckle on blender blade.

## 2015-10-06 ENCOUNTER — Telehealth: Payer: Self-pay | Admitting: *Deleted

## 2015-10-06 MED ORDER — TOPIRAMATE 100 MG PO TABS: ORAL_TABLET | ORAL | Status: DC

## 2015-10-06 NOTE — Telephone Encounter (Signed)
I have spoken with Myrla and per RAS, advised that csf pressure was high, that she should start Topamax 100mg , 1/2 tab po qhs for the first week, then increase to 1 tab po qhs.  She verbalized understanding of same.  Rx. escribed to in [place] per her request, and requested pharmacy deliver med./fim

## 2015-10-06 NOTE — Telephone Encounter (Signed)
-----   Message from Asa Lente, MD sent at 10/06/2015  3:21 PM EDT ----- Please let her know that the spinal fluid pressure was elevated. I would like her to start topiramate 100 mg by mouth daily at bedtime ( one half pill at night for the first week)

## 2015-10-28 DIAGNOSIS — H18412 Arcus senilis, left eye: Secondary | ICD-10-CM | POA: Diagnosis not present

## 2015-10-28 DIAGNOSIS — H2512 Age-related nuclear cataract, left eye: Secondary | ICD-10-CM | POA: Diagnosis not present

## 2015-10-28 DIAGNOSIS — H02839 Dermatochalasis of unspecified eye, unspecified eyelid: Secondary | ICD-10-CM | POA: Diagnosis not present

## 2015-10-28 DIAGNOSIS — H18411 Arcus senilis, right eye: Secondary | ICD-10-CM | POA: Diagnosis not present

## 2015-10-28 DIAGNOSIS — H2511 Age-related nuclear cataract, right eye: Secondary | ICD-10-CM | POA: Diagnosis not present

## 2015-10-28 DIAGNOSIS — H40001 Preglaucoma, unspecified, right eye: Secondary | ICD-10-CM | POA: Diagnosis not present

## 2015-11-07 ENCOUNTER — Emergency Department (HOSPITAL_COMMUNITY)
Admission: EM | Admit: 2015-11-07 | Discharge: 2015-11-07 | Disposition: A | Discharge status: Home / Self Care | Payer: Medicare Other | Attending: Emergency Medicine | Admitting: Emergency Medicine

## 2015-11-07 ENCOUNTER — Encounter (HOSPITAL_COMMUNITY): Payer: Self-pay | Admitting: Emergency Medicine

## 2015-11-07 DIAGNOSIS — Z9889 Other specified postprocedural states: Secondary | ICD-10-CM | POA: Insufficient documentation

## 2015-11-07 DIAGNOSIS — Z7984 Long term (current) use of oral hypoglycemic drugs: Secondary | ICD-10-CM | POA: Diagnosis not present

## 2015-11-07 DIAGNOSIS — Z79899 Other long term (current) drug therapy: Secondary | ICD-10-CM | POA: Insufficient documentation

## 2015-11-07 DIAGNOSIS — Z87448 Personal history of other diseases of urinary system: Secondary | ICD-10-CM | POA: Insufficient documentation

## 2015-11-07 DIAGNOSIS — E079 Disorder of thyroid, unspecified: Secondary | ICD-10-CM | POA: Insufficient documentation

## 2015-11-07 DIAGNOSIS — Z87891 Personal history of nicotine dependence: Secondary | ICD-10-CM | POA: Insufficient documentation

## 2015-11-07 DIAGNOSIS — E119 Type 2 diabetes mellitus without complications: Secondary | ICD-10-CM | POA: Insufficient documentation

## 2015-11-07 DIAGNOSIS — F410 Panic disorder [episodic paroxysmal anxiety] without agoraphobia: Secondary | ICD-10-CM | POA: Diagnosis not present

## 2015-11-07 DIAGNOSIS — G8929 Other chronic pain: Secondary | ICD-10-CM | POA: Diagnosis not present

## 2015-11-07 DIAGNOSIS — I10 Essential (primary) hypertension: Secondary | ICD-10-CM | POA: Diagnosis not present

## 2015-11-07 DIAGNOSIS — M797 Fibromyalgia: Secondary | ICD-10-CM | POA: Insufficient documentation

## 2015-11-07 DIAGNOSIS — J45909 Unspecified asthma, uncomplicated: Secondary | ICD-10-CM | POA: Insufficient documentation

## 2015-11-07 DIAGNOSIS — E7800 Pure hypercholesterolemia, unspecified: Secondary | ICD-10-CM | POA: Diagnosis not present

## 2015-11-07 DIAGNOSIS — J069 Acute upper respiratory infection, unspecified: Secondary | ICD-10-CM | POA: Diagnosis not present

## 2015-11-07 DIAGNOSIS — R05 Cough: Secondary | ICD-10-CM | POA: Diagnosis not present

## 2015-11-07 DIAGNOSIS — R0981 Nasal congestion: Secondary | ICD-10-CM | POA: Diagnosis present

## 2015-11-07 MED ORDER — BENZONATATE 100 MG PO CAPS: 100.0000 mg | ORAL_CAPSULE | Freq: Three times a day (TID) | ORAL | Status: DC | PRN

## 2015-11-07 NOTE — ED Notes (Signed)
Pt states that she has been having a lot of nasal congestion and drainage x 3 days.  Has been taking Mucinex without relief.  Also c/o productive cough.

## 2015-11-07 NOTE — Discharge Instructions (Signed)

## 2015-11-10 ENCOUNTER — Ambulatory Visit: Payer: Commercial Managed Care - HMO | Admitting: Neurology

## 2015-11-10 NOTE — ED Provider Notes (Signed)
CSN: [id number]     Arrival date & time 11/07/15  1154 History   First MD Initiated Contact with Patient 11/07/15 1254     Chief Complaint  Patient presents with  . Nasal Congestion     (Consider location/radiation/quality/duration/timing/severity/associated sxs/prior Treatment) HPI  [name] is a 59 y.o. female who presents to the Emergency Department complaining of nasal congestion, runny nose and cough for 3 days.  She states her nasal drainage is mostly clear and cough is intermittent and productive of sputum.  She has been taking mucinex without relief.  She denies fever, abd or chest pain, sore throat, shortness of breath.     Past Medical History  Diagnosis Date  . Diabetes mellitus   . Hypertension   . Asthma   . Overactive bladder   . High cholesterol   . Fibromyalgia   . Panic attacks   . Obstructive sleep apnea     Noncompliant with CPAP due to claustophobia.  . H/O cardiac catheterization     No significant CAD (only 20% LAD) 03/02/13 with normal LV function.  . Rectocele 05/21/2013  . OAB (overactive bladder) 05/21/2013  . Chronic pelvic pain in female   . Headache   . Chronic leg pain     bilateral  . Chronic back pain   . Seizures (HCC)     Had 1 with negative W/U 08/2014  . Thyroid disease    Past Surgical History  Procedure Laterality Date  . Abdominal hysterectomy    . Knee replacements      bilateral  . Back surgery    . Tonsillectomy    . Carpal tunnel release    . Ankle surgery    . Tubal ligation    . Left heart catheterization with coronary angiogram N/A 03/02/2013    Procedure: LEFT HEART CATHETERIZATION WITH CORONARY ANGIOGRAM;  Surgeon: Peter M Swaziland, MD;  Location: [facility] CATH LAB;  Service: Cardiovascular;  Laterality: N/A;  . Cardiac catheterization  02/2013    no significant coronary artery disease   Family History  Problem Relation Age of Onset  . Heart attack Mother   . Diabetes Mother   . Hypertension Mother   . Sick sinus  syndrome Brother     Pacemaker  . Congenital heart disease Brother   . Stroke Brother   . Coronary artery disease Brother   . Cancer Maternal Aunt     breast, liver,lung  . Cancer Cousin     leukemia   Social History  Substance Use Topics  . Smoking status: Former Smoker -- 1.00 packs/day for 2 years    Types: Cigarettes    Quit date: 12/13/1974  . Smokeless tobacco: Never Used  . Alcohol Use: No   OB History    Gravida Para Term Preterm AB TAB SAB Ectopic Multiple Living   2 2 2       2      Review of Systems  Constitutional: Negative for fever, chills, activity change and appetite change.  HENT: Positive for congestion, rhinorrhea and sneezing. Negative for facial swelling, sore throat and trouble swallowing.   Eyes: Negative for visual disturbance.  Respiratory: Positive for cough. Negative for shortness of breath, wheezing and stridor.   Gastrointestinal: Negative for nausea and vomiting.  Musculoskeletal: Negative for neck pain and neck stiffness.  Skin: Negative.   Neurological: Negative for dizziness, weakness, numbness and headaches.  Hematological: Negative for adenopathy.  Psychiatric/Behavioral: Negative for confusion.  All other systems reviewed and  are negative.     Allergies  Aspirin and Codeine  Home Medications   Prior to Admission medications   Medication Sig Start Date End Date Taking? Authorizing Provider  acetaZOLAMIDE (DIAMOX) 250 MG tablet Take 2 tablets by mouth daily. 09/23/15   Asa Lente, MD  acetaZOLAMIDE (DIAMOX) 500 MG capsule Take 500 mg by mouth daily. 09/23/15   Historical Provider, MD  atorvastatin (LIPITOR) 40 MG tablet Take 40 mg by mouth at bedtime.  10/15/14   Historical Provider, MD  benzonatate (TESSALON) 100 MG capsule Take 1 capsule (100 mg total) by mouth 3 (three) times daily as needed for cough. Swallow whole, do not chew 11/07/15   Keionna Kinnaird, PA-C  gabapentin (NEURONTIN) 300 MG capsule One or two po at night  09/08/15   Asa Lente, MD  imipramine (TOFRANIL) 50 MG tablet TAKE (1) TABLET BY MOUTH AT BEDTIME. 09/01/15   Adline Potter, NP  levETIRAcetam (KEPPRA) 1000 MG tablet Take 1 tablet (1,000 mg total) by mouth 2 (two) times daily. 09/08/15   Asa Lente, MD  levothyroxine (SYNTHROID, LEVOTHROID) 75 MCG tablet Take 75 mcg by mouth daily before breakfast.    Historical Provider, MD  metFORMIN (GLUCOPHAGE) 500 MG tablet Take 500 mg by mouth 2 (two) times daily with a meal.  03/26/14   Historical Provider, MD  metoprolol (LOPRESSOR) 50 MG tablet Take 50 mg by mouth 2 (two) times daily.      Historical Provider, MD  PROAIR HFA 108 (90 BASE) MCG/ACT inhaler Inhale 2 puffs into the lungs every 4 (four) hours as needed. For shortness of breath 10/01/14   Historical Provider, MD  RESTASIS 0.05 % ophthalmic emulsion Place 1 vial into both eyes 2 (two) times daily. 08/14/15   Historical Provider, MD  topiramate (TOPAMAX) 100 MG tablet For the first week, take 1/2 tablet at bedtime.  After that, take 1 tablet at bedtime. 10/06/15   Asa Lente, MD  TOVIAZ 8 MG TB24 tablet TAKE ONE TABLET BY MOUTH ONCE DAILY. 06/30/15   Adline Potter, NP   BP 118/68 mmHg  Pulse 65  Temp(Src) 98.5 F (36.9 C) (Oral)  Resp 18  Ht 5\' 7"  (1.702 m)  Wt 130.182 kg  BMI 44.94 kg/m2  SpO2 98% Physical Exam  Constitutional: She is oriented to person, place, and time. She appears well-developed and well-nourished. No distress.  HENT:  Head: Normocephalic and atraumatic.  Right Ear: Tympanic membrane and ear canal normal.  Left Ear: Tympanic membrane and ear canal normal.  Nose: Mucosal edema and rhinorrhea present.  Mouth/Throat: Uvula is midline and mucous membranes are normal. No trismus in the jaw. No uvula swelling. Posterior oropharyngeal erythema present. No oropharyngeal exudate, posterior oropharyngeal edema or tonsillar abscesses.  Eyes: Conjunctivae are normal.  Neck: Normal range of motion and phonation  normal. Neck supple. No Brudzinski's sign and no Kernig's sign noted.  Cardiovascular: Normal rate and regular rhythm.   No murmur heard. Pulmonary/Chest: Effort normal and breath sounds normal. No respiratory distress. She has no wheezes. She has no rales.  Abdominal: Soft. She exhibits no distension. There is no tenderness. There is no rebound and no guarding.  Musculoskeletal: She exhibits no edema.  Lymphadenopathy:    She has no cervical adenopathy.  Neurological: She is alert and oriented to person, place, and time. She exhibits normal muscle tone. Coordination normal.  Skin: Skin is warm and dry.  Psychiatric: She has a normal mood and affect.  Nursing  note and vitals reviewed.   ED Course  Procedures (including critical care time)   MDM   Final diagnoses:  URI (upper respiratory infection)    Patient is well appearing.  Vitals stable.  symptoms are likely related to viral process.  Pt agrees to symptomatic tx and close PMD f/u if needed.  rx for Tessalon.     Pauline Aus, PA-C 11/10/15 2024  Bethann Berkshire, MD 11/18/15 [id number]  Bethann Berkshire, MD 11/19/15 [phone]

## 2015-11-11 DIAGNOSIS — J00 Acute nasopharyngitis [common cold]: Secondary | ICD-10-CM | POA: Diagnosis not present

## 2015-11-19 ENCOUNTER — Ambulatory Visit (INDEPENDENT_AMBULATORY_CARE_PROVIDER_SITE_OTHER): Payer: Medicare Other | Admitting: Neurology

## 2015-11-19 ENCOUNTER — Encounter: Payer: Self-pay | Admitting: Neurology

## 2015-11-19 VITALS — BP 140/80 | HR 95 | Ht 67.0 in | Wt 273.6 lb

## 2015-11-19 DIAGNOSIS — G932 Benign intracranial hypertension: Secondary | ICD-10-CM | POA: Insufficient documentation

## 2015-11-19 DIAGNOSIS — G40309 Generalized idiopathic epilepsy and epileptic syndromes, not intractable, without status epilepticus: Secondary | ICD-10-CM

## 2015-11-19 DIAGNOSIS — M542 Cervicalgia: Secondary | ICD-10-CM

## 2015-11-19 NOTE — Progress Notes (Signed)
[facility]  PATIENT: [name] DOB: [birth year]  REFERRING DOCTOR OR PCP:  Shelva Majestic SOURCE: patient and records form EMR and Dr. [name]  _________________________________   HISTORICAL  CHIEF COMPLAINT:  Chief Complaint  Patient presents with  . Follow-up    Rm 5. Cannot tolerate gabapentin. Makes her too groggy. Taking diamox 500mg /day. No complications.    HISTORY OF PRESENT ILLNESS:  [name] is a 59 year old woman who has a generalized tonic-clonic seizure last year and recently diagnosed with pseudotumor cerebri (started on Diamox) with headaches.   She also has obstructive sleep apnea.  Pseudotumor cerebri:   Due to headaches and papilledema, she underwent a LP.    CSF pressure was elevated and Diamox was started.   She had trouble tolerating diamox   However, headaches continue to do well.   Her blurry vision is better.    A recent eye exam reportedly just showed cataract.   Lightheadedness with standing up is also better.    Seizure: In August 2015, she had generalized tonic-clonic seizure activity that was witnessed by her husband. She has had no prior seizures and none since.   She continues is on Keppra.  An MRI of the brain at that time was essentially normal. An EEG was performed and showed 2 runs of generalized spike wave activity, consistent with a generalized seizure disorder. No family history of seizures (doe snot know father's hx).    She has not had head trauma in the past. She had a normal childhood development. She has not had any meningitis or encephalitis.  Obstructive sleep apnea: About 4 or 5 years ago she was diagnosed with OSA with an overnight sleep study. She was started on CPAP by Dr. 01-22-1992.    However, she has trouble tolerating the mask.    She used to use it more.   She is claustophobic.    She reports fatigue sets of daytime sleepiness and we discussed that we will need to get her last download to determine if her  settings need to be adjusted.  REVIEW OF SYSTEMS: Constitutional: No fevers, chills, sweats, or change in appetite.   Purposeful 10 pound weight loss Eyes: cataracts.    Improved blurriness Ear, nose and throat: No hearing loss, ear pain, nasal congestion, sore throat Cardiovascular: No chest pain, palpitations Respiratory: No shortness of breath at rest or with exertion.   Some cough and wheezes GastrointestinaI: No nausea, vomiting, diarrhea, abdominal pain, fecal incontinence Genitourinary: No dysuria, urinary retention or frequency.  No nocturia. Musculoskeletal: No neck pain, back pain Integumentary: No rash, pruritus, skin lesions Neurological: as above Psychiatric: No depression at this time.  No anxiety Endocrine: No palpitations, diaphoresis, change in appetite, change in weigh or increased thirst Hematologic/Lymphatic: No anemia, purpura, petechiae. Allergic/Immunologic: No itchy/runny eyes, nasal congestion, recent allergic reactions, rashes  ALLERGIES: Allergies  Allergen Reactions  . Aspirin Nausea And Vomiting  . Codeine Rash    HOME MEDICATIONS:  Current outpatient prescriptions:  .  acetaZOLAMIDE (DIAMOX) 250 MG tablet, Take 2 tablets by mouth daily., Disp: 60 tablet, Rfl: 5 .  atorvastatin (LIPITOR) 40 MG tablet, Take 40 mg by mouth at bedtime. , Disp: , Rfl:  .  imipramine (TOFRANIL) 50 MG tablet, TAKE (1) TABLET BY MOUTH AT BEDTIME., Disp: 30 tablet, Rfl: 3 .  levETIRAcetam (KEPPRA) 1000 MG tablet, Take 1 tablet (1,000 mg total) by mouth 2 (two) times daily., Disp: 60 tablet, Rfl: 5 .  levothyroxine (SYNTHROID, LEVOTHROID)  75 MCG tablet, Take 75 mcg by mouth daily before breakfast., Disp: , Rfl:  .  metFORMIN (GLUCOPHAGE) 500 MG tablet, Take 500 mg by mouth 2 (two) times daily with a meal. , Disp: , Rfl:  .  metoprolol (LOPRESSOR) 50 MG tablet, Take 50 mg by mouth 2 (two) times daily.  , Disp: , Rfl:  .  PROAIR HFA 108 (90 BASE) MCG/ACT inhaler, Inhale 2  puffs into the lungs every 4 (four) hours as needed. For shortness of breath, Disp: , Rfl:  .  RESTASIS 0.05 % ophthalmic emulsion, Place 1 vial into both eyes 2 (two) times daily., Disp: , Rfl:  .  topiramate (TOPAMAX) 100 MG tablet, For the first week, take 1/2 tablet at bedtime.  After that, take 1 tablet at bedtime., Disp: 30 tablet, Rfl: 3 .  TOVIAZ 8 MG TB24 tablet, TAKE ONE TABLET BY MOUTH ONCE DAILY., Disp: 30 tablet, Rfl: 6 .  gabapentin (NEURONTIN) 300 MG capsule, One or two po at night (Patient not taking: Reported on 11/19/2015), Disp: 60 capsule, Rfl: 5  PAST MEDICAL HISTORY: Past Medical History  Diagnosis Date  . Diabetes mellitus   . Hypertension   . Asthma   . Overactive bladder   . High cholesterol   . Fibromyalgia   . Panic attacks   . Obstructive sleep apnea     Noncompliant with CPAP due to claustophobia.  . H/O cardiac catheterization     No significant CAD (only 20% LAD) 03/02/13 with normal LV function.  . Rectocele 05/21/2013  . OAB (overactive bladder) 05/21/2013  . Chronic pelvic pain in female   . Headache   . Chronic leg pain     bilateral  . Chronic back pain   . Seizures (HCC)     Had 1 with negative W/U 08/2014  . Thyroid disease     PAST SURGICAL HISTORY: Past Surgical History  Procedure Laterality Date  . Abdominal hysterectomy    . Knee replacements      bilateral  . Back surgery    . Tonsillectomy    . Carpal tunnel release    . Ankle surgery    . Tubal ligation    . Left heart catheterization with coronary angiogram N/A 03/02/2013    Procedure: LEFT HEART CATHETERIZATION WITH CORONARY ANGIOGRAM;  Surgeon: Peter M Swaziland, MD;  Location[facility] CATH LAB;  Service: Cardiovascular;  Laterality: N/A;  . Cardiac catheterization  02/2013    no significant coronary artery disease    FAMILY HISTORY: Family History  Problem Relation Age of Onset  . Heart attack Mother   . Diabetes Mother   . Hypertension Mother   . Sick sinus syndrome Brother      Pacemaker  . Congenital heart disease Brother   . Stroke Brother   . Coronary artery disease Brother   . Cancer Maternal Aunt     breast, liver,lung  . Cancer Cousin     leukemia    SOCIAL HISTORY:  Social History   Social History  . Marital Status: Married    Spouse Name: N/A  . Number of Children: N/A  . Years of Education: N/A   Occupational History  . Not on file.   Social History Main Topics  . Smoking status: Former Smoker -- 1.00 packs/day for 2 years    Types: Cigarettes    Quit date: 12/13/1974  . Smokeless tobacco: Never Used  . Alcohol Use: No  . Drug Use: No  . Sexual  Activity: Not Currently    Birth Control/ Protection: Surgical   Other Topics Concern  . Not on file   Social History Narrative     PHYSICAL EXAM  Filed Vitals:   11/19/15 1457  BP: 140/80  Pulse: 95  Height: 5\' 7"  (1.702 m)  Weight: 273 lb 9.6 oz (124.104 kg)  SpO2: 96%    Body mass index is 42.84 kg/(m^2).   General: The patient is an obese woman in no acute distress  Eyes:  Funduscopic exam shows very mild bilateral papilledema and normal retinal vessels.   She has venous pulsations  Neck: The neck is supple, no carotid bruits are noted.  The neck is vnow non-tender    Neurologic Exam  Mental status: The patient is alert and oriented x 3 at the time of the examination. The patient has apparent normal recent and remote memory, with an apparently normal attention span and concentration ability.   Speech is normal.  Cranial nerves: Extraocular movements are full. Pupils are equal, round, and reactive to light and accomodation.   There is good facial sensation to soft touch bilaterally.Facial strength is normal.  Trapezius and sternocleidomastoid strength is normal. No dysarthria is noted.    No obvious hearing deficits are noted.  Motor:  Muscle bulk is normal.   Tone is normal. Strength is  5 / 5 in all 4 extremities.   Sensory: Sensory testing is intact to soft touch but  decreased vibration sensation in toes.  Coordination: Cerebellar testing reveals good finger-nose-finger and heel-to-shin bilaterally.  Gait and station: Station is normal.   Gait is normal. Tandem gait is normal. Romberg is negative.   Reflexes: Deep tendon reflexes are symmetric and normal bilaterally.    \   DIAGNOSTIC DATA (LABS, IMAGING, TESTING) - I reviewed patient records, labs, notes, testing and imaging myself where available.  Lab Results  Component Value Date   WBC 4.5 08/23/2015   HGB 13.9 08/23/2015   HCT 41.8 08/23/2015   MCV 89.7 08/23/2015   PLT 188 08/23/2015      Component Value Date/Time   NA 137 08/23/2015 0504   K 4.0 08/23/2015 0504   CL 107 08/23/2015 0504   CO2 23 08/23/2015 0504   GLUCOSE 185* 08/23/2015 0504   BUN 13 08/23/2015 0504   CREATININE 0.80 08/23/2015 0504   CALCIUM 8.7* 08/23/2015 0504   PROT 6.8 08/23/2015 0504   ALBUMIN 3.8 08/23/2015 0504   AST 48* 08/23/2015 0504   ALT 38 08/23/2015 0504   ALKPHOS 65 08/23/2015 0504   BILITOT 0.7 08/23/2015 0504   GFRNONAA >60 08/23/2015 0504   GFRAA >60 08/23/2015 0504   Lab Results  Component Value Date   CHOL 193 07/22/2014   HDL 37* 07/22/2014   LDLCALC 111* 07/22/2014   TRIG 226* 07/22/2014   CHOLHDL 5.2 07/22/2014   Lab Results  Component Value Date   HGBA1C 6.5* 07/22/2014      ASSESSMENT AND PLAN  Epilepsy, generalized, convulsive (HCC)  Idiopathic intracranial hypertension  Morbid obesity, unspecified obesity type (HCC)  Neck pain     1.   Continue  Keppra.   She has not had any further seizures since starting Keppra 2.   If visual changes return, any VF defects or papilledema or worsening headache, will change Keppra to topiramate and consider repeat LP to determine pressure.  . 3.  She will have cataract surgery soon.     She will return to see me in 6 months  or sooner if there are new or worsening neurologic symptoms.   Buffy Ehler A. Epimenio Foot, MD, PhD 11/19/2015,  3:12 PM Certified in Neurology, Clinical Neurophysiology, Sleep Medicine, Pain Medicine and Neuroimaging  [facility] [address] [place], [place] [address] [phone]

## 2015-11-20 ENCOUNTER — Ambulatory Visit: Payer: Commercial Managed Care - HMO | Admitting: Neurology

## 2015-12-04 ENCOUNTER — Other Ambulatory Visit: Payer: Self-pay | Admitting: Adult Health

## 2015-12-04 MED ORDER — OXYBUTYNIN CHLORIDE 5 MG PO TABS: 5.0000 mg | ORAL_TABLET | Freq: Two times a day (BID) | ORAL | Status: DC

## 2015-12-22 ENCOUNTER — Other Ambulatory Visit: Payer: Self-pay | Admitting: *Deleted

## 2015-12-22 NOTE — Patient Outreach (Signed)
[facility]) Care Management  12/22/2015  [name] 01-Jun-1956 [id number]   Referral from [facility] Tier 4 list: Telephone call to patient who was advised of reason for call.  Patient agrees to do screening assessment but requesting another time, unable to talk now.   Plan:  Made appointment for screening with patient agreement. Will follow up.

## 2015-12-23 ENCOUNTER — Other Ambulatory Visit: Payer: Self-pay | Admitting: *Deleted

## 2015-12-23 NOTE — Patient Outreach (Signed)
[facility]) Care Management  12/23/2015  THU BAGGETT 1956-02-17 [id number]   Telephone call to patient; no answer & unable to leave message.  Plan: Will follow up.  Colleen Can, RN BSN CCM Care Management Coordinator [facility] Care Management  [phone]

## 2015-12-26 ENCOUNTER — Encounter: Payer: Self-pay | Admitting: *Deleted

## 2015-12-26 ENCOUNTER — Other Ambulatory Visit: Payer: Self-pay | Admitting: *Deleted

## 2015-12-26 NOTE — Patient Outreach (Signed)
[facility]) Care Management  12/26/2015  TACEY DIMAGGIO 02/21/1956 [id number]   [employer] Tier 4 referral:  Telephone to patient who verified that she no longer has [facility]. States she has had  [place] health care insurance since Nov 2016. Patient gave HIPPA verification.  Patient was advised of reason for call and of [facility] care management services. States she currently has [place] [facility] care nurse that sees her ar regular intervals throughout the year. States she also has telephonic case Production designer, theatre/television/film from [employer] that calls her periodically. States she currently does not have any health care concerns that are not being taken care of. States she sees neurologist and eye specialist  As well as primary care doctor. States no problems getting appointments or getting to appointments. States current prescription plan has affordable co pays and she has no problem getting medications.  States she manages own medications and knows why she is taken each pill. Voices understanding of importance of medication compliance.   Patient declines [facility] care management services; states already has [place] working with her.   Send MD closure letter Send to care management assistant to close. Send [facility] brochure to patient.  Colleen Can, RN BSN CCM Care Management Coordinator [facility] Care Management  [phone]

## 2015-12-29 DIAGNOSIS — H2512 Age-related nuclear cataract, left eye: Secondary | ICD-10-CM | POA: Diagnosis not present

## 2015-12-29 DIAGNOSIS — H25812 Combined forms of age-related cataract, left eye: Secondary | ICD-10-CM | POA: Diagnosis not present

## 2015-12-30 DIAGNOSIS — H2511 Age-related nuclear cataract, right eye: Secondary | ICD-10-CM | POA: Diagnosis not present

## 2015-12-31 ENCOUNTER — Other Ambulatory Visit: Payer: Self-pay | Admitting: Adult Health

## 2016-01-07 DIAGNOSIS — E039 Hypothyroidism, unspecified: Secondary | ICD-10-CM | POA: Diagnosis not present

## 2016-01-07 DIAGNOSIS — E1165 Type 2 diabetes mellitus with hyperglycemia: Secondary | ICD-10-CM | POA: Diagnosis not present

## 2016-01-07 DIAGNOSIS — I10 Essential (primary) hypertension: Secondary | ICD-10-CM | POA: Diagnosis not present

## 2016-01-09 DIAGNOSIS — E039 Hypothyroidism, unspecified: Secondary | ICD-10-CM | POA: Diagnosis not present

## 2016-01-09 DIAGNOSIS — E119 Type 2 diabetes mellitus without complications: Secondary | ICD-10-CM | POA: Diagnosis not present

## 2016-01-09 DIAGNOSIS — I10 Essential (primary) hypertension: Secondary | ICD-10-CM | POA: Diagnosis not present

## 2016-01-09 DIAGNOSIS — R945 Abnormal results of liver function studies: Secondary | ICD-10-CM | POA: Diagnosis not present

## 2016-01-16 DIAGNOSIS — H2511 Age-related nuclear cataract, right eye: Secondary | ICD-10-CM | POA: Diagnosis not present

## 2016-01-16 DIAGNOSIS — H25811 Combined forms of age-related cataract, right eye: Secondary | ICD-10-CM | POA: Diagnosis not present

## 2016-01-25 ENCOUNTER — Encounter (HOSPITAL_COMMUNITY): Payer: Self-pay

## 2016-01-25 ENCOUNTER — Emergency Department (HOSPITAL_COMMUNITY)
Admission: EM | Admit: 2016-01-25 | Discharge: 2016-01-25 | Disposition: A | Discharge status: Home / Self Care | Payer: Medicare Other | Attending: Emergency Medicine | Admitting: Emergency Medicine

## 2016-01-25 ENCOUNTER — Emergency Department (HOSPITAL_COMMUNITY): Payer: Medicare Other

## 2016-01-25 DIAGNOSIS — Z79899 Other long term (current) drug therapy: Secondary | ICD-10-CM | POA: Insufficient documentation

## 2016-01-25 DIAGNOSIS — Z87891 Personal history of nicotine dependence: Secondary | ICD-10-CM | POA: Diagnosis not present

## 2016-01-25 DIAGNOSIS — E7800 Pure hypercholesterolemia, unspecified: Secondary | ICD-10-CM | POA: Insufficient documentation

## 2016-01-25 DIAGNOSIS — R0789 Other chest pain: Secondary | ICD-10-CM

## 2016-01-25 DIAGNOSIS — F410 Panic disorder [episodic paroxysmal anxiety] without agoraphobia: Secondary | ICD-10-CM | POA: Insufficient documentation

## 2016-01-25 DIAGNOSIS — G8929 Other chronic pain: Secondary | ICD-10-CM | POA: Diagnosis not present

## 2016-01-25 DIAGNOSIS — J45901 Unspecified asthma with (acute) exacerbation: Secondary | ICD-10-CM | POA: Insufficient documentation

## 2016-01-25 DIAGNOSIS — M797 Fibromyalgia: Secondary | ICD-10-CM | POA: Insufficient documentation

## 2016-01-25 DIAGNOSIS — R079 Chest pain, unspecified: Secondary | ICD-10-CM | POA: Diagnosis not present

## 2016-01-25 DIAGNOSIS — I10 Essential (primary) hypertension: Secondary | ICD-10-CM | POA: Diagnosis not present

## 2016-01-25 DIAGNOSIS — Z9889 Other specified postprocedural states: Secondary | ICD-10-CM | POA: Insufficient documentation

## 2016-01-25 DIAGNOSIS — E079 Disorder of thyroid, unspecified: Secondary | ICD-10-CM | POA: Insufficient documentation

## 2016-01-25 DIAGNOSIS — Z8742 Personal history of other diseases of the female genital tract: Secondary | ICD-10-CM | POA: Diagnosis not present

## 2016-01-25 DIAGNOSIS — R072 Precordial pain: Secondary | ICD-10-CM | POA: Diagnosis not present

## 2016-01-25 DIAGNOSIS — Z7984 Long term (current) use of oral hypoglycemic drugs: Secondary | ICD-10-CM | POA: Insufficient documentation

## 2016-01-25 DIAGNOSIS — Z87448 Personal history of other diseases of urinary system: Secondary | ICD-10-CM | POA: Diagnosis not present

## 2016-01-25 DIAGNOSIS — E119 Type 2 diabetes mellitus without complications: Secondary | ICD-10-CM | POA: Insufficient documentation

## 2016-01-25 LAB — I-STAT TROPONIN, ED
TROPONIN I, POC: 0 ng/mL (ref 0.00–0.08)
TROPONIN I, POC: 0 ng/mL (ref 0.00–0.08)

## 2016-01-25 LAB — CBC
HCT: 41.8 % (ref 36.0–46.0)
HEMOGLOBIN: 13.8 g/dL (ref 12.0–15.0)
MCH: 29.3 pg (ref 26.0–34.0)
MCHC: 33 g/dL (ref 30.0–36.0)
MCV: 88.7 fL (ref 78.0–100.0)
Platelets: 187 10*3/uL (ref 150–400)
RBC: 4.71 MIL/uL (ref 3.87–5.11)
RDW: 13 % (ref 11.5–15.5)
WBC: 5.6 10*3/uL (ref 4.0–10.5)

## 2016-01-25 LAB — BASIC METABOLIC PANEL
ANION GAP: 9 (ref 5–15)
BUN: 19 mg/dL (ref 6–20)
CALCIUM: 9.3 mg/dL (ref 8.9–10.3)
CO2: 24 mmol/L (ref 22–32)
Chloride: 107 mmol/L (ref 101–111)
Creatinine, Ser: 0.71 mg/dL (ref 0.44–1.00)
Glucose, Bld: 123 mg/dL — ABNORMAL HIGH (ref 65–99)
Potassium: 4.3 mmol/L (ref 3.5–5.1)
SODIUM: 140 mmol/L (ref 135–145)

## 2016-01-25 MED ORDER — IBUPROFEN 800 MG PO TABS
800.0000 mg | ORAL_TABLET | Freq: Once | ORAL | Status: AC
  Administered 2016-01-25: 800 mg via ORAL
  Filled 2016-01-25: qty 1

## 2016-01-25 NOTE — ED Notes (Addendum)
Pt states pain to both shoulders, arms, and across chest. Pt states constant ache with worsening pain with movement of arms and certain other movements. Resting makes pain better. Pt noticed pain after waking this morning at 1020. Pts husband states she did a lot of work around the house yesterday. Pt states she felt fine prior to  this morning.Pt also states headache since waking this morning as well. Pt sates slight sob. NAD Pt is tearful

## 2016-01-25 NOTE — ED Notes (Signed)
Pt complain of chest pain that started this morning. Pt tearful @ triage

## 2016-01-25 NOTE — Discharge Instructions (Signed)

## 2016-01-25 NOTE — ED Provider Notes (Addendum)
CSN: [id number]     Arrival date & time 01/25/16  1047 History  By signing my name below, I, [name], attest that this documentation has been prepared under the direction and in the presence of Margarita Grizzle, MD. Electronically Signed: Octavia Heir, ED Scribe. 01/25/2016. 11:45 AM.    Chief Complaint  Patient presents with  . Chest Pain      Patient is a 60 y.o. female presenting with chest pain. The history is provided by the patient. No language interpreter was used.  Chest Pain Pain location:  Substernal area Pain radiates to:  Does not radiate Pain radiates to the back: no   Pain severity:  Mild Onset quality:  Sudden Duration:  1 hour Timing:  Constant Progression:  Worsening Chronicity:  New Context: movement   Relieved by:  Rest Worsened by:  Movement Ineffective treatments:  Oxygen and rest Associated symptoms: shortness of breath   Associated symptoms: no cough, no nausea and not vomiting    HPI Comments: [name] is a 60 y.o. female who has a hx of DM, HTN, asthma, fibromyalgia, cardiac catheterization presents to the Emergency Department complaining of constant, gradual worsening, moderate, mid-central chest pain with associated bilateral arm and shoulder pain onset this morning upon awakening. Husband states she cleaned the house yesterday and started having pain this morning. Pt notes mild shortness of breath when she sits up. She states that movement increases her pain. She has not taken any medication to alleviate the pain. Pt had cataract surgery done in her right eye one week ago. Denies fever, cough, nausea, vomiting, diarrhea, hx of blood clots in lungs/legs, and etoh use.Pt is a non-smoker   Past Medical History  Diagnosis Date  . Diabetes mellitus   . Hypertension   . Asthma   . Overactive bladder   . High cholesterol   . Fibromyalgia   . Panic attacks   . Obstructive sleep apnea     Noncompliant with CPAP due to claustophobia.  . H/O  cardiac catheterization     No significant CAD (only 20% LAD) 03/02/13 with normal LV function.  . Rectocele 05/21/2013  . OAB (overactive bladder) 05/21/2013  . Chronic pelvic pain in female   . Headache   . Chronic leg pain     bilateral  . Chronic back pain   . Seizures (HCC)     Had 1 with negative W/U 08/2014  . Thyroid disease    Past Surgical History  Procedure Laterality Date  . Abdominal hysterectomy    . Knee replacements      bilateral  . Back surgery    . Tonsillectomy    . Carpal tunnel release    . Ankle surgery    . Tubal ligation    . Left heart catheterization with coronary angiogram N/A 03/02/2013    Procedure: LEFT HEART CATHETERIZATION WITH CORONARY ANGIOGRAM;  Surgeon: Peter M Swaziland, MD;  Location: [facility] CATH LAB;  Service: Cardiovascular;  Laterality: N/A;  . Cardiac catheterization  02/2013    no significant coronary artery disease   Family History  Problem Relation Age of Onset  . Heart attack Mother   . Diabetes Mother   . Hypertension Mother   . Sick sinus syndrome Brother     Pacemaker  . Congenital heart disease Brother   . Stroke Brother   . Coronary artery disease Brother   . Cancer Maternal Aunt     breast, liver,lung  . Cancer Cousin  leukemia   Social History  Substance Use Topics  . Smoking status: Former Smoker -- 1.00 packs/day for 2 years    Types: Cigarettes    Quit date: 12/13/1974  . Smokeless tobacco: Never Used  . Alcohol Use: No   OB History    Gravida Para Term Preterm AB TAB SAB Ectopic Multiple Living   2 2 2       2      Review of Systems  Respiratory: Positive for shortness of breath. Negative for cough.   Cardiovascular: Positive for chest pain.  Gastrointestinal: Negative for nausea, vomiting and diarrhea.  All other systems reviewed and are negative.     Allergies  Aspirin and Codeine  Home Medications   Prior to Admission medications   Medication Sig Start Date End Date Taking? Authorizing Provider   acetaZOLAMIDE (DIAMOX) 250 MG tablet Take 2 tablets by mouth daily. 09/23/15   11/23/15, MD  atorvastatin (LIPITOR) 40 MG tablet Take 40 mg by mouth at bedtime.  10/15/14   Historical Provider, MD  gabapentin (NEURONTIN) 300 MG capsule One or two po at night Patient not taking: Reported on 11/19/2015 09/08/15   09/10/15, MD  imipramine (TOFRANIL) 50 MG tablet TAKE (1) TABLET BY MOUTH AT BEDTIME. 12/31/15   01/02/16, NP  levETIRAcetam (KEPPRA) 1000 MG tablet Take 1 tablet (1,000 mg total) by mouth 2 (two) times daily. 09/08/15   09/10/15, MD  levothyroxine (SYNTHROID, LEVOTHROID) 75 MCG tablet Take 75 mcg by mouth daily before breakfast.    Historical Provider, MD  metFORMIN (GLUCOPHAGE) 500 MG tablet Take 500 mg by mouth 2 (two) times daily with a meal.  03/26/14   Historical Provider, MD  metoprolol (LOPRESSOR) 50 MG tablet Take 50 mg by mouth 2 (two) times daily.      Historical Provider, MD  oxybutynin (DITROPAN) 5 MG tablet TAKE ONE TABLET BY MOUTH TWICE DAILY. 12/31/15   01/02/16, NP  PROAIR HFA 108 (90 BASE) MCG/ACT inhaler Inhale 2 puffs into the lungs every 4 (four) hours as needed. For shortness of breath 10/01/14   Historical Provider, MD  RESTASIS 0.05 % ophthalmic emulsion Place 1 vial into both eyes 2 (two) times daily. 08/14/15   Historical Provider, MD  topiramate (TOPAMAX) 100 MG tablet For the first week, take 1/2 tablet at bedtime.  After that, take 1 tablet at bedtime. 10/06/15   10/08/15, MD   Triage vitals: BP 117/73 mmHg  Pulse 77  Temp(Src) 98 F (36.7 C) (Oral)  Resp 15  Ht 5\' 7"  (1.702 m)  Wt 272 lb (123.378 kg)  BMI 42.59 kg/m2  SpO2 94% Physical Exam  Constitutional: She is oriented to person, place, and time. She appears well-developed and well-nourished.  HENT:  Head: Normocephalic and atraumatic.  Right Ear: Tympanic membrane and external ear normal.  Left Ear: Tympanic membrane and external ear normal.  Nose: Nose  normal. Right sinus exhibits no maxillary sinus tenderness and no frontal sinus tenderness. Left sinus exhibits no maxillary sinus tenderness and no frontal sinus tenderness.  Eyes: Conjunctivae and EOM are normal. Pupils are equal, round, and reactive to light. Right eye exhibits no nystagmus. Left eye exhibits no nystagmus.  Neck: Normal range of motion. Neck supple.  Cardiovascular: Normal rate, regular rhythm, normal heart sounds and intact distal pulses.   Pulmonary/Chest: Effort normal and breath sounds normal. No respiratory distress. She exhibits tenderness.  Tenderness across the lateral part of  chest  Abdominal: Soft. Bowel sounds are normal. She exhibits no distension and no mass. There is no tenderness.  Musculoskeletal: Normal range of motion. She exhibits no edema or tenderness.  Diffuse trace edema, morbid obesity  Neurological: She is alert and oriented to person, place, and time. She has normal strength and normal reflexes. No sensory deficit. She displays a negative Romberg sign. GCS eye subscore is 4. GCS verbal subscore is 5. GCS motor subscore is 6.  Reflex Scores:      Tricep reflexes are 2+ on the right side and 2+ on the left side.      Bicep reflexes are 2+ on the right side and 2+ on the left side.      Brachioradialis reflexes are 2+ on the right side and 2+ on the left side.      Patellar reflexes are 2+ on the right side and 2+ on the left side.      Achilles reflexes are 2+ on the right side and 2+ on the left side. Patient with normal gait without ataxia, shuffling, spasm, or antalgia. Speech is normal without dysarthria, dysphasia, or aphasia. Muscle strength is 5/5 in bilateral shoulders, elbow flexor and extensors, wrist flexor and extensors, and intrinsic hand muscles. 5/5 bilateral lower extremity hip flexors, extensors, knee flexors and extensors, and ankle dorsi and plantar flexors.    Skin: Skin is warm and dry. No rash noted.  Psychiatric: She has a normal  mood and affect. Her behavior is normal. Judgment and thought content normal.  Nursing note and vitals reviewed.   ED Course  Procedures  DIAGNOSTIC STUDIES: Oxygen Saturation is 94% on RA, normal by my interpretation.  COORDINATION OF CARE:  11:45 AM Discussed treatment plan with pt at bedside and pt agreed to plan.  Labs Review Labs Reviewed  BASIC METABOLIC PANEL - Abnormal; Notable for the following:    Glucose, Bld 123 (*)    All other components within normal limits  CBC  I-STAT TROPOININ, ED  I-STAT TROPOININ, ED  Rosezena Sensor, ED    Imaging Review Dg Chest 2 View  01/25/2016  CLINICAL DATA:  Acute chest pain. EXAM: CHEST  2 VIEW COMPARISON:  01/10/2015 and prior exams FINDINGS: The cardiomediastinal silhouette is unremarkable. Mild peribronchial thickening is unchanged. There is no evidence of focal airspace disease, pulmonary edema, suspicious pulmonary nodule/mass, pleural effusion, or pneumothorax. No acute bony abnormalities are identified. IMPRESSION: No active cardiopulmonary disease. Electronically Signed   By: Harmon Pier M.D.   On: 01/25/2016 12:32   I have personally reviewed and evaluated these images and lab results as part of my medical decision-making.   EKG Interpretation   Date/Time:  Sunday January 25 2016 10:56:20 EST Ventricular Rate:  77 PR Interval:  174 QRS Duration: 68 QT Interval:  441 QTC Calculation: 499 R Axis:   28 Text Interpretation:  Sinus rhythm Atrial premature complexes Baseline  wander Confirmed by Alberta Cairns MD, Duwayne Heck ([id number]) on 01/25/2016 11:12:28 AM      MDM   Final diagnoses:  Musculoskeletal chest pain    60 year old female presents today with anterior chest pain that is reproducible with movement and palpation. Evaluation here reveals no evidence of cardiac ischemia. Patient advised return cautions need for follow-up and voices understanding  Margarita Grizzle, MD 01/25/16 1356 I personally performed the services  described in this documentation, which was scribed in my presence. The recorded information has been reviewed and considered.   Margarita Grizzle, MD 01/25/16 1356

## 2016-01-26 ENCOUNTER — Other Ambulatory Visit (HOSPITAL_COMMUNITY): Payer: Self-pay | Admitting: Internal Medicine

## 2016-01-26 DIAGNOSIS — Z1231 Encounter for screening mammogram for malignant neoplasm of breast: Secondary | ICD-10-CM

## 2016-01-28 ENCOUNTER — Telehealth: Payer: Self-pay | Admitting: Internal Medicine

## 2016-01-28 NOTE — Telephone Encounter (Signed)
Letter mailed to pt.  

## 2016-01-28 NOTE — Telephone Encounter (Signed)
RECALL FOR TCS °

## 2016-02-04 ENCOUNTER — Ambulatory Visit (HOSPITAL_COMMUNITY)
Admission: RE | Admit: 2016-02-04 | Discharge: 2016-02-04 | Disposition: A | Discharge status: Home / Self Care | Payer: Medicare Other | Source: Ambulatory Visit | Attending: Internal Medicine | Admitting: Internal Medicine

## 2016-02-04 DIAGNOSIS — Z1231 Encounter for screening mammogram for malignant neoplasm of breast: Secondary | ICD-10-CM | POA: Insufficient documentation

## 2016-02-09 DIAGNOSIS — E039 Hypothyroidism, unspecified: Secondary | ICD-10-CM | POA: Diagnosis not present

## 2016-02-09 DIAGNOSIS — I10 Essential (primary) hypertension: Secondary | ICD-10-CM | POA: Diagnosis not present

## 2016-02-09 DIAGNOSIS — R51 Headache: Secondary | ICD-10-CM | POA: Diagnosis not present

## 2016-02-09 DIAGNOSIS — E119 Type 2 diabetes mellitus without complications: Secondary | ICD-10-CM | POA: Diagnosis not present

## 2016-03-03 ENCOUNTER — Other Ambulatory Visit: Payer: Self-pay | Admitting: Adult Health

## 2016-03-16 DIAGNOSIS — E039 Hypothyroidism, unspecified: Secondary | ICD-10-CM | POA: Diagnosis not present

## 2016-04-05 ENCOUNTER — Other Ambulatory Visit: Payer: Self-pay | Admitting: Adult Health

## 2016-04-13 ENCOUNTER — Encounter: Payer: Self-pay | Admitting: Adult Health

## 2016-04-13 ENCOUNTER — Ambulatory Visit (INDEPENDENT_AMBULATORY_CARE_PROVIDER_SITE_OTHER): Payer: Medicare Other | Admitting: Adult Health

## 2016-04-13 VITALS — BP 102/60 | HR 60 | Ht 67.0 in | Wt 277.0 lb

## 2016-04-13 DIAGNOSIS — N3281 Overactive bladder: Secondary | ICD-10-CM | POA: Diagnosis not present

## 2016-04-13 MED ORDER — OXYBUTYNIN CHLORIDE 5 MG PO TABS: 5.0000 mg | ORAL_TABLET | Freq: Two times a day (BID) | ORAL | Status: DC

## 2016-04-13 MED ORDER — IMIPRAMINE HCL 50 MG PO TABS: ORAL_TABLET | ORAL | Status: DC

## 2016-04-13 NOTE — Progress Notes (Signed)
Subjective:     Patient ID: [name], female   DOB: 1956/02/26, 60 y.o.   MRN: [id number]  HPI [name] is a 60 year old white female in to get refills on ditropan and imipramine for OAB, has to peed often if not on meds, Dr [name] is her PCP and she has visit with him soon.  Review of Systems + OAB  Reviewed past medical,surgical, social and family history. Reviewed medications and allergies.     Objective:   Physical Exam  BP 102/60 mmHg  Pulse 60  Ht 5\' 7"  (1.702 m)  Wt 277 lb (125.646 kg)  BMI 43.37 kg/m2   Talk only, she is doing well but has to pee often if does not take her meds., wants refills. Assessment:     OAB    Plan:     Refilled ditropan 5 mg  #60 take 1 bid with 12 refills Refilled imipramine 50 mg #30 take 1 at hs with 12 refills Follow up in 1 year,prn problems

## 2016-04-13 NOTE — Patient Instructions (Signed)
Follow up with me

## 2016-04-19 ENCOUNTER — Encounter (HOSPITAL_COMMUNITY): Payer: Self-pay | Admitting: Emergency Medicine

## 2016-04-19 ENCOUNTER — Emergency Department (HOSPITAL_COMMUNITY)
Admission: EM | Admit: 2016-04-19 | Discharge: 2016-04-19 | Disposition: A | Discharge status: Home / Self Care | Payer: Medicare Other | Attending: Emergency Medicine | Admitting: Emergency Medicine

## 2016-04-19 DIAGNOSIS — Z79899 Other long term (current) drug therapy: Secondary | ICD-10-CM | POA: Insufficient documentation

## 2016-04-19 DIAGNOSIS — E119 Type 2 diabetes mellitus without complications: Secondary | ICD-10-CM | POA: Diagnosis not present

## 2016-04-19 DIAGNOSIS — I10 Essential (primary) hypertension: Secondary | ICD-10-CM | POA: Insufficient documentation

## 2016-04-19 DIAGNOSIS — J45909 Unspecified asthma, uncomplicated: Secondary | ICD-10-CM | POA: Insufficient documentation

## 2016-04-19 DIAGNOSIS — R51 Headache: Secondary | ICD-10-CM | POA: Diagnosis not present

## 2016-04-19 DIAGNOSIS — Z87891 Personal history of nicotine dependence: Secondary | ICD-10-CM | POA: Insufficient documentation

## 2016-04-19 DIAGNOSIS — Z7984 Long term (current) use of oral hypoglycemic drugs: Secondary | ICD-10-CM | POA: Insufficient documentation

## 2016-04-19 DIAGNOSIS — Z791 Long term (current) use of non-steroidal anti-inflammatories (NSAID): Secondary | ICD-10-CM | POA: Diagnosis not present

## 2016-04-19 DIAGNOSIS — R519 Headache, unspecified: Secondary | ICD-10-CM

## 2016-04-19 LAB — CBG MONITORING, ED: GLUCOSE-CAPILLARY: 100 mg/dL — AB (ref 65–99)

## 2016-04-19 NOTE — ED Notes (Addendum)
PT stated she woke up this morning with a headache and has been extremely drowsy and sleeping today. PT also states body chills today. PT also stated some urinary retention that started this am.

## 2016-04-19 NOTE — Discharge Instructions (Signed)
It is okay to take Tylenol or ibuprofen for pain. If your headaches continue follow up with your neurologist at [facility]. We don't feel that we need to drain fluid off of your spine tonight

## 2016-04-19 NOTE — ED Provider Notes (Addendum)
CSN: [id number]     Arrival date & time 04/19/16  1745 History  By signing my name below, I, [name], attest that this documentation has been prepared under the direction and in the presence of Doug Sou, MD. Electronically Signed: Doreatha Greene, ED Scribe. 04/19/2016. 9:27 PM.    Chief Complaint  Patient presents with  . Headache   The history is provided by the patient. No language interpreter was used.   HPI Comments: [name] is a 60 y.o. female with h/o DM, HTN, seizures in 2015 on Keppra who presents to the Emergency Department complaining of mild 8/10 HA Gradual onset this morning with associated generalized fatigue. Pt states her HA began when she woke up. Pt states she has taken Ibuprofen partial relief of pain. Pain is mild at present She reports frequent h/o less severe HA, occurring once a month. Denies sudden onset or sharp HA. She also notes a sensation of urinary urgency earlier today that has since resolved. She has no urinary symptoms. And no feeling of post void residual. Pt is a non-smoker and non-drinker. Denies fever, visual disturbance. PCP is Dr. [name]. CBG 2 days ago was 240. No recent seizures. Pt is followed by neurologist Dr. Misty Stanley with [facility]. Nothing makes symptoms better or worse. She feels improved after treatment with ibuprofen. No other associated symptoms  Past Medical History  Diagnosis Date  . Diabetes mellitus   . Hypertension   . Asthma   . Overactive bladder   . High cholesterol   . Fibromyalgia   . Panic attacks   . Obstructive sleep apnea     Noncompliant with CPAP due to claustophobia.  . H/O cardiac catheterization     No significant CAD (only 20% LAD) 03/02/13 with normal LV function.  . Rectocele 05/21/2013  . OAB (overactive bladder) 05/21/2013  . Chronic pelvic pain in female   . Headache   . Chronic leg pain     bilateral  . Chronic back pain   . Seizures (HCC)     Had 1 with negative W/U 08/2014  .  Thyroid disease    Past Surgical History  Procedure Laterality Date  . Abdominal hysterectomy    . Knee replacements      bilateral  . Back surgery    . Tonsillectomy    . Carpal tunnel release    . Ankle surgery    . Tubal ligation    . Left heart catheterization with coronary angiogram N/A 03/02/2013    Procedure: LEFT HEART CATHETERIZATION WITH CORONARY ANGIOGRAM;  Surgeon: Peter M Swaziland, MD;  Location: [facility] CATH LAB;  Service: Cardiovascular;  Laterality: N/A;  . Cardiac catheterization  02/2013    no significant coronary artery disease   Family History  Problem Relation Age of Onset  . Heart attack Mother   . Diabetes Mother   . Hypertension Mother   . Sick sinus syndrome Brother     Pacemaker  . Congenital heart disease Brother   . Stroke Brother   . Coronary artery disease Brother   . Cancer Maternal Aunt     breast, liver,lung  . Cancer Cousin     leukemia   Social History  Substance Use Topics  . Smoking status: Former Smoker -- 1.00 packs/day for 2 years    Types: Cigarettes    Quit date: 12/13/1974  . Smokeless tobacco: Never Used  . Alcohol Use: No   OB History    Gravida Para Term  Preterm AB TAB SAB Ectopic Multiple Living   2 2 2       2      Review of Systems  Constitutional: Positive for fatigue. Negative for fever.  Eyes: Negative for visual disturbance.  Respiratory: Negative.   Cardiovascular: Negative.   Gastrointestinal: Negative.   Genitourinary: Negative for urgency and decreased urine volume.  Musculoskeletal: Negative.   Skin: Negative.   Allergic/Immunologic: Positive for immunocompromised state.       Diabetic  Neurological: Positive for headaches.  Psychiatric/Behavioral: Negative.   All other systems reviewed and are negative.  Allergies  Aspirin and Codeine  Home Medications   Prior to Admission medications   Medication Sig Start Date End Date Taking? Authorizing Provider  ALBUTEROL IN Inhale into the lungs as needed.     Historical Provider, MD  atorvastatin (LIPITOR) 40 MG tablet Take 40 mg by mouth at bedtime.  10/15/14   Historical Provider, MD  BELSOMRA 15 MG TABS Takes 1 tab every other night 04/03/16   Historical Provider, MD  ibuprofen (ADVIL,MOTRIN) 200 MG tablet Take 400 mg by mouth as needed.    Historical Provider, MD  imipramine (TOFRANIL) 50 MG tablet TAKE (1) TABLET BY MOUTH AT BEDTIME. 04/13/16   Adline Potter, NP  levETIRAcetam (KEPPRA) 1000 MG tablet Take 1 tablet (1,000 mg total) by mouth 2 (two) times daily. 09/08/15   Asa Lente, MD  levothyroxine (SYNTHROID, LEVOTHROID) 75 MCG tablet Take 75 mcg by mouth daily before breakfast.    Historical Provider, MD  metFORMIN (GLUCOPHAGE) 500 MG tablet Take 500 mg by mouth 2 (two) times daily with a meal.  03/26/14   Historical Provider, MD  metoprolol (LOPRESSOR) 50 MG tablet Take 50 mg by mouth 2 (two) times daily.      Historical Provider, MD  oxybutynin (DITROPAN) 5 MG tablet Take 1 tablet (5 mg total) by mouth 2 (two) times daily. 04/13/16   Adline Potter, NP  RESTASIS 0.05 % ophthalmic emulsion Place 1 vial into both eyes 2 (two) times daily. 08/14/15   Historical Provider, MD  topiramate (TOPAMAX) 100 MG tablet For the first week, take 1/2 tablet at bedtime.  After that, take 1 tablet at bedtime. 10/06/15   Asa Lente, MD   BP 117/80 mmHg  Pulse 65  Temp(Src) 98.1 F (36.7 C) (Oral)  Resp 18  Ht 5\' 7"  (1.702 m)  Wt 276 lb (125.193 kg)  BMI 43.22 kg/m2  SpO2 93% Physical Exam  Constitutional: She is oriented to person, place, and time. She appears well-developed and well-nourished.  HENT:  Head: Normocephalic and atraumatic.  Eyes: Conjunctivae are normal. Pupils are equal, round, and reactive to light.  Optic discs sharp  Neck: Neck supple. No tracheal deviation present. No thyromegaly present.  No bruit  Cardiovascular: Normal rate and regular rhythm.   No murmur heard. Pulmonary/Chest: Effort normal and breath sounds  normal.  Abdominal: Soft. Bowel sounds are normal. She exhibits no distension. There is no tenderness.  Morbidly obese  Musculoskeletal: Normal range of motion. She exhibits no edema or tenderness.  Neurological: She is alert and oriented to person, place, and time. No cranial nerve deficit. Coordination normal.  Cranial nerves 2-12 grossly intact. Normal finger to nose testing.  Gait normal Romberg normal pronator drift normal  Skin: Skin is warm and dry. No rash noted.  Psychiatric: She has a normal mood and affect.  Nursing note and vitals reviewed.   ED Course  Procedures (including critical  care time) DIAGNOSTIC STUDIES: Oxygen Saturation is 97% on RA, normal by my interpretation.    COORDINATION OF CARE: 9:25 PM Discussed treatment plan with pt at bedside which includes neurology f/u and pt agreed to plan.   Labs Review Labs Reviewed  CBG MONITORING, ED   I have personally reviewed and evaluated these lab results as part of my medical decision-making.   MDM  Patient reports that she's had similar headaches. At one point in the neurologist "drained fluid off of my spine"to help with headaches. Headache is felt to be nonspecific at present. No further diagnostic tests needed. I don't feel that patient needs therapeutic LP at present. She has no visual changes optic discs are sharp. Headache is mild Plan follow-up with neurologist if headaches continue. Final diagnoses:  None    Dx Headache   I personally performed the services described in this documentation, which was scribed in my presence. The recorded information has been reviewed and considered.   Doug Sou, MD 04/19/16 2206  Doug Sou, MD 04/19/16 2210

## 2016-05-18 DIAGNOSIS — J0100 Acute maxillary sinusitis, unspecified: Secondary | ICD-10-CM | POA: Diagnosis not present

## 2016-05-19 ENCOUNTER — Ambulatory Visit (INDEPENDENT_AMBULATORY_CARE_PROVIDER_SITE_OTHER): Payer: Medicare Other | Admitting: Neurology

## 2016-05-19 ENCOUNTER — Encounter: Payer: Self-pay | Admitting: Neurology

## 2016-05-19 VITALS — BP 120/70 | HR 64 | Resp 16 | Ht 67.0 in | Wt 273.5 lb

## 2016-05-19 DIAGNOSIS — R569 Unspecified convulsions: Secondary | ICD-10-CM

## 2016-05-19 DIAGNOSIS — R519 Headache, unspecified: Secondary | ICD-10-CM

## 2016-05-19 DIAGNOSIS — M542 Cervicalgia: Secondary | ICD-10-CM

## 2016-05-19 DIAGNOSIS — G4733 Obstructive sleep apnea (adult) (pediatric): Secondary | ICD-10-CM

## 2016-05-19 DIAGNOSIS — R51 Headache: Secondary | ICD-10-CM

## 2016-05-19 MED ORDER — ETODOLAC 400 MG PO TABS: 400.0000 mg | ORAL_TABLET | Freq: Two times a day (BID) | ORAL | Status: DC | PRN

## 2016-05-19 MED ORDER — LEVETIRACETAM 500 MG PO TABS: 500.0000 mg | ORAL_TABLET | Freq: Two times a day (BID) | ORAL | Status: DC

## 2016-05-19 NOTE — Progress Notes (Signed)
[facility]  PATIENT: [name] DOB: 31-Dec-1955  REFERRING DOCTOR OR PCP:  Shelva Majestic SOURCE: patient and records form EMR and Dr. [name]  _________________________________   HISTORICAL  CHIEF COMPLAINT:  Chief Complaint  Patient presents with  . Seizures    Denies sz. activity since last ov.  Sts. she is having more frequent h/a's (one a week), more severe h/a's.  "It all depends on how stressed out I get."  Sts. neck is "still tender, but doesn't hurt like it did."/fim    . Neck Pain    HISTORY OF PRESENT ILLNESS:  [name] is a 60 year old woman who has a generalized tonic-clonic seizure last year and recently diagnosed with pseudotumor cerebri (started on Diamox) with headaches.   She also has obstructive sleep apnea.  Seizure: She denies any seizures since the last visit.   In August 2015, she had generalized tonic-clonic seizure activity that was witnessed by her husband. She has had no prior seizures and none since.   She continues is on Keppra.  An MRI of the brain at that time was essentially normal. An EEG was performed and showed 2 runs of generalized spike wave activity, consistent with a generalized seizure disorder. She is unaware of any family history of seizures.    She has not had head trauma in the past. She had a normal childhood development. She has not had any meningitis or encephalitis.  Pseudotumor cerebri/headache and neck pain:   Due to headaches and papilledema, she underwent a LP.    CSF pressure was elevated At 26.5 cm..   She had a lot of bladder issues on Diamox and stopped.   Headaches are mostly occipital.  However, headaches never returned to the severity or frequency that they were before the lumbar puncture. The HA occurs 1 -2 times a week.  Ibuprofen often knocks the headache out, especially if she lays down.   Her blurry vision is better.    A recent eye exam reportedly just showed cataract.   Lightheadedness with  standing up is also better.    She reports that her ophthalmologist told her that the optic nerve changes were due to cataracts.  Obstructive sleep apnea: About 4 or 5 years ago she was diagnosed with OSA with an overnight sleep study. She was started on CPAP +6 cm in the past.    However, she had trouble tolerating the mask and stopped.   She is claustophobic so seldom made it through the night with CPAP.    She reports some fatigue and daytime sleepiness.     REVIEW OF SYSTEMS: Constitutional: No fevers, chills, sweats, or change in appetite.   She notes some fatigue and sleepiness. Eyes: cataracts.    Improved blurriness Ear, nose and throat: No hearing loss, ear pain, nasal congestion, sore throat Cardiovascular: No chest pain, palpitations Respiratory: No shortness of breath at rest or with exertion.   Some cough and wheezes GastrointestinaI: No nausea, vomiting, diarrhea, abdominal pain, fecal incontinence Genitourinary: No dysuria, urinary retention or frequency.  No nocturia. Musculoskeletal: No neck pain, back pain Integumentary: No rash, pruritus, skin lesions Neurological: as above Psychiatric: No depression at this time.  No anxiety.  Notes increased stress at times. Endocrine: No palpitations, diaphoresis, change in appetite, change in weigh or increased thirst Hematologic/Lymphatic: No anemia, purpura, petechiae. Allergic/Immunologic: No itchy/runny eyes, nasal congestion, recent allergic reactions, rashes  ALLERGIES: Allergies  Allergen Reactions  . Aspirin Nausea And Vomiting  . Codeine  Rash    HOME MEDICATIONS:  Current outpatient prescriptions:  .  albuterol (PROVENTIL HFA;VENTOLIN HFA) 108 (90 Base) MCG/ACT inhaler, Inhale 1-2 puffs into the lungs every 6 (six) hours as needed for wheezing or shortness of breath., Disp: , Rfl:  .  atorvastatin (LIPITOR) 40 MG tablet, Take 40 mg by mouth at bedtime. , Disp: , Rfl:  .  BELSOMRA 15 MG TABS, Take 15 mg by mouth at  bedtime as needed (for sleep). Takes 1 tab every other night, Disp: , Rfl:  .  ibuprofen (ADVIL,MOTRIN) 200 MG tablet, Take 200 mg by mouth daily as needed for headache or mild pain. , Disp: , Rfl:  .  imipramine (TOFRANIL) 50 MG tablet, TAKE (1) TABLET BY MOUTH AT BEDTIME., Disp: 30 tablet, Rfl: 12 .  levETIRAcetam (KEPPRA) 1000 MG tablet, Take 1 tablet (1,000 mg total) by mouth 2 (two) times daily. (Patient taking differently: Take 500 mg by mouth 2 (two) times daily. ), Disp: 60 tablet, Rfl: 5 .  levothyroxine (SYNTHROID, LEVOTHROID) 88 MCG tablet, Take 88 mcg by mouth daily before breakfast., Disp: , Rfl:  .  metFORMIN (GLUCOPHAGE) 500 MG tablet, Take 500 mg by mouth 2 (two) times daily with a meal. , Disp: , Rfl:  .  metoprolol (LOPRESSOR) 50 MG tablet, Take 50 mg by mouth 2 (two) times daily.  , Disp: , Rfl:  .  oxybutynin (DITROPAN) 5 MG tablet, Take 1 tablet (5 mg total) by mouth 2 (two) times daily., Disp: 60 tablet, Rfl: 12 .  RESTASIS 0.05 % ophthalmic emulsion, Place 1 drop into both eyes 2 (two) times daily. , Disp: , Rfl:   PAST MEDICAL HISTORY: Past Medical History  Diagnosis Date  . Diabetes mellitus   . Hypertension   . Asthma   . Overactive bladder   . High cholesterol   . Fibromyalgia   . Panic attacks   . Obstructive sleep apnea     Noncompliant with CPAP due to claustophobia.  . H/O cardiac catheterization     No significant CAD (only 20% LAD) 03/02/13 with normal LV function.  . Rectocele 05/21/2013  . OAB (overactive bladder) 05/21/2013  . Chronic pelvic pain in female   . Headache   . Chronic leg pain     bilateral  . Chronic back pain   . Seizures (HCC)     Had 1 with negative W/U 08/2014  . Thyroid disease     PAST SURGICAL HISTORY: Past Surgical History  Procedure Laterality Date  . Abdominal hysterectomy    . Knee replacements      bilateral  . Back surgery    . Tonsillectomy    . Carpal tunnel release    . Ankle surgery    . Tubal ligation    .  Left heart catheterization with coronary angiogram N/A 03/02/2013    Procedure: LEFT HEART CATHETERIZATION WITH CORONARY ANGIOGRAM;  Surgeon: Peter M Swaziland, MD;  Location: [facility] CATH LAB;  Service: Cardiovascular;  Laterality: N/A;  . Cardiac catheterization  02/2013    no significant coronary artery disease    FAMILY HISTORY: Family History  Problem Relation Age of Onset  . Heart attack Mother   . Diabetes Mother   . Hypertension Mother   . Sick sinus syndrome Brother     Pacemaker  . Congenital heart disease Brother   . Stroke Brother   . Coronary artery disease Brother   . Cancer Maternal Aunt     breast, liver,lung  .  Cancer Cousin     leukemia    SOCIAL HISTORY:  Social History   Social History  . Marital Status: Married    Spouse Name: N/A  . Number of Children: N/A  . Years of Education: N/A   Occupational History  . Not on file.   Social History Main Topics  . Smoking status: Former Smoker -- 1.00 packs/day for 2 years    Types: Cigarettes    Quit date: 12/13/1974  . Smokeless tobacco: Never Used  . Alcohol Use: No  . Drug Use: No  . Sexual Activity: Not Currently    Birth Control/ Protection: Surgical     Comment: hyst   Other Topics Concern  . Not on file   Social History Narrative     PHYSICAL EXAM  Filed Vitals:   05/19/16 1519  BP: 120/70  Pulse: 64  Resp: 16  Height: 5\' 7"  (1.702 m)  Weight: 273 lb 8 oz (124.059 kg)    Body mass index is 42.83 kg/(m^2).   General: The patient is an obese woman in no acute distress  Eyes:  Funduscopic exam now shows normal optic discs.  Normal retinal vessels.   She has venous pulsations  Neck: The neck is supple, no carotid bruits are noted.  The neck is vnow non-tender    Neurologic Exam  Mental status: The patient is alert and oriented x 3 at the time of the examination. The patient has apparent normal recent and remote memory, with an apparently normal attention span and concentration ability.    Speech is normal.  Cranial nerves: Extraocular movements are full. Pupils are equal, round, and reactive to light and accomodation.   There is good facial sensation to soft touch bilaterally.Facial strength is normal.  Trapezius and sternocleidomastoid strength is normal. No dysarthria is noted.    No obvious hearing deficits are noted.  Motor:  Muscle bulk is normal.   Tone is normal. Strength is  5 / 5 in all 4 extremities.   Sensory: Sensory testing is intact to soft touch but decreased vibration sensation in toes.  Coordination: Cerebellar testing reveals good finger-nose-finger and heel-to-shin bilaterally.  Gait and station: Station is normal.   Gait is normal. Tandem gait is normal. Romberg is negative.   Reflexes: Deep tendon reflexes are symmetric and normal bilaterally.    \   DIAGNOSTIC DATA (LABS, IMAGING, TESTING) - I reviewed patient records, labs, notes, testing and imaging myself where available.  Lab Results  Component Value Date   WBC 5.6 01/25/2016   HGB 13.8 01/25/2016   HCT 41.8 01/25/2016   MCV 88.7 01/25/2016   PLT 187 01/25/2016      Component Value Date/Time   NA 140 01/25/2016 1115   K 4.3 01/25/2016 1115   CL 107 01/25/2016 1115   CO2 24 01/25/2016 1115   GLUCOSE 123* 01/25/2016 1115   BUN 19 01/25/2016 1115   CREATININE 0.71 01/25/2016 1115   CALCIUM 9.3 01/25/2016 1115   PROT 6.8 08/23/2015 0504   ALBUMIN 3.8 08/23/2015 0504   AST 48* 08/23/2015 0504   ALT 38 08/23/2015 0504   ALKPHOS 65 08/23/2015 0504   BILITOT 0.7 08/23/2015 0504   GFRNONAA >60 01/25/2016 1115   GFRAA >60 01/25/2016 1115   Lab Results  Component Value Date   CHOL 193 07/22/2014   HDL 37* 07/22/2014   LDLCALC 111* 07/22/2014   TRIG 226* 07/22/2014   CHOLHDL 5.2 07/22/2014   Lab Results  Component Value  Date   HGBA1C 6.5* 07/22/2014      ASSESSMENT AND PLAN  Seizure (HCC)  Headache, occipital  Obstructive sleep apnea  Neck pain    1.   Continue   Keppra 500 mg po bid.   She has not had any further seizures since starting Keppra 2.   Lodine prn headache.   If the headache frequency worsens, consider trigger point injections or occipital nerve block. 3   Advised to wear her CPAP if she finds she still has trouble doing so, she should discuss an oral appliance with her sleep medicine doctor.  4.   She will return to see me in 12 months or sooner if there are new or worsening neurologic symptoms.   Xavier Munger A. Epimenio Foot, MD, PhD 05/19/2016, 3:37 PM Certified in Neurology, Clinical Neurophysiology, Sleep Medicine, Pain Medicine and Neuroimaging  [facility] [address], [address] [place], [place] [address] [phone]

## 2016-06-04 DIAGNOSIS — E782 Mixed hyperlipidemia: Secondary | ICD-10-CM | POA: Diagnosis not present

## 2016-06-04 DIAGNOSIS — E119 Type 2 diabetes mellitus without complications: Secondary | ICD-10-CM | POA: Diagnosis not present

## 2016-06-04 DIAGNOSIS — E039 Hypothyroidism, unspecified: Secondary | ICD-10-CM | POA: Diagnosis not present

## 2016-06-08 DIAGNOSIS — F5101 Primary insomnia: Secondary | ICD-10-CM | POA: Diagnosis not present

## 2016-06-08 DIAGNOSIS — E039 Hypothyroidism, unspecified: Secondary | ICD-10-CM | POA: Diagnosis not present

## 2016-06-08 DIAGNOSIS — E119 Type 2 diabetes mellitus without complications: Secondary | ICD-10-CM | POA: Diagnosis not present

## 2016-06-08 DIAGNOSIS — E782 Mixed hyperlipidemia: Secondary | ICD-10-CM | POA: Diagnosis not present

## 2016-06-08 DIAGNOSIS — R5383 Other fatigue: Secondary | ICD-10-CM | POA: Diagnosis not present

## 2016-08-01 ENCOUNTER — Encounter (HOSPITAL_COMMUNITY): Payer: Self-pay | Admitting: Emergency Medicine

## 2016-08-01 ENCOUNTER — Emergency Department (HOSPITAL_COMMUNITY): Payer: Medicare Other

## 2016-08-01 ENCOUNTER — Emergency Department (HOSPITAL_COMMUNITY)
Admission: EM | Admit: 2016-08-01 | Discharge: 2016-08-01 | Disposition: A | Discharge status: Home / Self Care | Payer: Medicare Other | Attending: Emergency Medicine | Admitting: Emergency Medicine

## 2016-08-01 DIAGNOSIS — S8992XA Unspecified injury of left lower leg, initial encounter: Secondary | ICD-10-CM | POA: Diagnosis not present

## 2016-08-01 DIAGNOSIS — M25572 Pain in left ankle and joints of left foot: Secondary | ICD-10-CM | POA: Diagnosis not present

## 2016-08-01 DIAGNOSIS — Z7984 Long term (current) use of oral hypoglycemic drugs: Secondary | ICD-10-CM | POA: Insufficient documentation

## 2016-08-01 DIAGNOSIS — I10 Essential (primary) hypertension: Secondary | ICD-10-CM | POA: Diagnosis not present

## 2016-08-01 DIAGNOSIS — W19XXXA Unspecified fall, initial encounter: Secondary | ICD-10-CM

## 2016-08-01 DIAGNOSIS — E119 Type 2 diabetes mellitus without complications: Secondary | ICD-10-CM | POA: Insufficient documentation

## 2016-08-01 DIAGNOSIS — S99912A Unspecified injury of left ankle, initial encounter: Secondary | ICD-10-CM | POA: Diagnosis not present

## 2016-08-01 DIAGNOSIS — S93402A Sprain of unspecified ligament of left ankle, initial encounter: Secondary | ICD-10-CM | POA: Insufficient documentation

## 2016-08-01 DIAGNOSIS — Y939 Activity, unspecified: Secondary | ICD-10-CM | POA: Insufficient documentation

## 2016-08-01 DIAGNOSIS — X500XXA Overexertion from strenuous movement or load, initial encounter: Secondary | ICD-10-CM | POA: Insufficient documentation

## 2016-08-01 DIAGNOSIS — Z791 Long term (current) use of non-steroidal anti-inflammatories (NSAID): Secondary | ICD-10-CM | POA: Diagnosis not present

## 2016-08-01 DIAGNOSIS — J45909 Unspecified asthma, uncomplicated: Secondary | ICD-10-CM | POA: Insufficient documentation

## 2016-08-01 DIAGNOSIS — Z79899 Other long term (current) drug therapy: Secondary | ICD-10-CM | POA: Diagnosis not present

## 2016-08-01 DIAGNOSIS — Y929 Unspecified place or not applicable: Secondary | ICD-10-CM | POA: Diagnosis not present

## 2016-08-01 DIAGNOSIS — Y999 Unspecified external cause status: Secondary | ICD-10-CM | POA: Insufficient documentation

## 2016-08-01 DIAGNOSIS — R52 Pain, unspecified: Secondary | ICD-10-CM | POA: Diagnosis not present

## 2016-08-01 DIAGNOSIS — M25552 Pain in left hip: Secondary | ICD-10-CM | POA: Diagnosis not present

## 2016-08-01 DIAGNOSIS — S7002XA Contusion of left hip, initial encounter: Secondary | ICD-10-CM | POA: Insufficient documentation

## 2016-08-01 DIAGNOSIS — M25579 Pain in unspecified ankle and joints of unspecified foot: Secondary | ICD-10-CM | POA: Diagnosis not present

## 2016-08-01 DIAGNOSIS — S99922A Unspecified injury of left foot, initial encounter: Secondary | ICD-10-CM | POA: Diagnosis not present

## 2016-08-01 DIAGNOSIS — Z87891 Personal history of nicotine dependence: Secondary | ICD-10-CM | POA: Insufficient documentation

## 2016-08-01 MED ORDER — HYDROMORPHONE HCL 2 MG/ML IJ SOLN
2.0000 mg | Freq: Once | INTRAMUSCULAR | Status: AC
  Administered 2016-08-01: 2 mg via INTRAMUSCULAR
  Filled 2016-08-01: qty 1

## 2016-08-01 NOTE — ED Notes (Signed)
Pt alert & oriented x4. Patient given discharge instructions, paperwork & prescription(s). Patient verbalized understanding. Pt left department in wheelchair escorted by staff. Pt left w/ no further questions. 

## 2016-08-01 NOTE — ED Provider Notes (Signed)
[facility] DEPT Provider Note   CSN: [id number] Arrival date & time: 08/01/16  1635     History   Chief Complaint Chief Complaint  Patient presents with  . Leg Injury  . Hip Injury  . Ankle Injury    HPI [name] is a 60 y.o. female presenting with left leg pain after a fall. Patient was unloading items from a truck and when she stepped on a pallet her left foot when into one of the holes. She then twisted on her ankle and fell backwards. She thinks she hit her left lateral hip on the concrete. She did not hit her head or lose consciousness. Having severe pain in her left ankle as well as her left lateral hip. No thigh, knee, or shin pain. Patient did not try to walk. Has not taken anything for the pain. No weakness or numbness.  HPI  Past Medical History:  Diagnosis Date  . Asthma   . Chronic back pain   . Chronic leg pain    bilateral  . Chronic pelvic pain in female   . Diabetes mellitus   . Fibromyalgia   . H/O cardiac catheterization    No significant CAD (only 20% LAD) 03/02/13 with normal LV function.  Marland Kitchen Headache   . High cholesterol   . Hypertension   . OAB (overactive bladder) 05/21/2013  . Obstructive sleep apnea    Noncompliant with CPAP due to claustophobia.  . Overactive bladder   . Panic attacks   . Rectocele 05/21/2013  . Seizures ([place])    Had 1 with negative W/U 08/2014  . Thyroid disease     Patient Active Problem List   Diagnosis Date Noted  . Idiopathic intracranial hypertension 11/19/2015  . Papilledema 09/08/2015  . Left facial numbness 08/22/2015  . Epilepsy, generalized, convulsive ([place]) 06/05/2015  . Obstructive sleep apnea 06/05/2015  . Headache, occipital 06/05/2015  . Neck pain 06/05/2015  . Occipital neuralgia of right side 06/05/2015  . Seizure ([place]) 07/21/2014  . Rectocele 05/21/2013  . OAB (overactive bladder) 05/21/2013  . Difficulty in walking(719.7) 04/11/2013  . Leg weakness, bilateral 04/11/2013  . Chest pain,  unspecified 02/28/2013  . Diabetes ([place]) 02/28/2013  . Morbid obesity ([place]) 02/28/2013  . Other and unspecified hyperlipidemia 02/28/2013  . CHRONIC OSTEOMYELITIS ANKLE AND FOOT 07/22/2009  . Hypertension 07/22/2009    Past Surgical History:  Procedure Laterality Date  . ABDOMINAL HYSTERECTOMY    . ANKLE SURGERY    . BACK SURGERY    . CARDIAC CATHETERIZATION  02/2013   no significant coronary artery disease  . CARPAL TUNNEL RELEASE    . knee replacements     bilateral  . LEFT HEART CATHETERIZATION WITH CORONARY ANGIOGRAM N/A 03/02/2013   Procedure: LEFT HEART CATHETERIZATION WITH CORONARY ANGIOGRAM;  Surgeon: Peter M Martinique, MD;  Location: [facility] CATH LAB;  Service: Cardiovascular;  Laterality: N/A;  . TONSILLECTOMY    . TUBAL LIGATION      OB History    Gravida Para Term Preterm AB Living   '2 2 2     2   ' SAB TAB Ectopic Multiple Live Births                   Home Medications    Prior to Admission medications   Medication Sig Start Date End Date Taking? Authorizing Provider  albuterol (PROVENTIL HFA;VENTOLIN HFA) 108 (90 Base) MCG/ACT inhaler Inhale 1-2 puffs into the lungs every 6 (six) hours as needed for  wheezing or shortness of breath.   Yes Historical Provider, MD  atorvastatin (LIPITOR) 40 MG tablet Take 40 mg by mouth at bedtime.  10/15/14  Yes Historical Provider, MD  BELSOMRA 15 MG TABS Take 15 mg by mouth at bedtime as needed (for sleep). Takes 1 tab every other night 04/03/16  Yes Historical Provider, MD  ibuprofen (ADVIL,MOTRIN) 200 MG tablet Take 200 mg by mouth daily as needed for headache or mild pain.    Yes Historical Provider, MD  imipramine (TOFRANIL) 50 MG tablet TAKE (1) TABLET BY MOUTH AT BEDTIME. 04/13/16  Yes Estill Dooms, NP  levETIRAcetam (KEPPRA) 500 MG tablet Take 1 tablet (500 mg total) by mouth 2 (two) times daily. 05/19/16  Yes Britt Bottom, MD  levothyroxine (SYNTHROID, LEVOTHROID) 88 MCG tablet Take 88 mcg by mouth daily before breakfast.   Yes  Historical Provider, MD  metFORMIN (GLUCOPHAGE) 500 MG tablet Take 500 mg by mouth 2 (two) times daily with a meal.  03/26/14  Yes Historical Provider, MD  metoprolol (LOPRESSOR) 50 MG tablet Take 50 mg by mouth 2 (two) times daily.     Yes Historical Provider, MD  oxybutynin (DITROPAN) 5 MG tablet Take 1 tablet (5 mg total) by mouth 2 (two) times daily. 04/13/16  Yes Estill Dooms, NP  RESTASIS 0.05 % ophthalmic emulsion Place 1 drop into both eyes 2 (two) times daily.  08/14/15  Yes Historical Provider, MD    Family History Family History  Problem Relation Age of Onset  . Heart attack Mother   . Diabetes Mother   . Hypertension Mother   . Sick sinus syndrome Brother     Pacemaker  . Congenital heart disease Brother   . Stroke Brother   . Coronary artery disease Brother   . Cancer Maternal Aunt     breast, liver,lung  . Cancer Cousin     leukemia    Social History Social History  Substance Use Topics  . Smoking status: Former Smoker    Packs/day: 1.00    Years: 2.00    Types: Cigarettes    Quit date: 12/13/1974  . Smokeless tobacco: Never Used  . Alcohol use No     Allergies   Aspirin and Codeine   Review of Systems Review of Systems  Musculoskeletal: Positive for arthralgias.  Skin: Negative for color change and wound.  Neurological: Negative for weakness and numbness.  All other systems reviewed and are negative.    Physical Exam Updated Vital Signs BP 121/65 (BP Location: Left Arm)   Pulse 71   Temp 97.9 F (36.6 C) (Oral)   Resp 18   Ht '5\' 7"'  (1.702 m)   Wt 283 lb (128.4 kg)   SpO2 90%   BMI 44.32 kg/m   Physical Exam  Constitutional: She is oriented to person, place, and time. She appears well-developed and well-nourished. No distress.  Morbidly obese, no acute distress  HENT:  Head: Normocephalic and atraumatic.  Right Ear: External ear normal.  Left Ear: External ear normal.  Nose: Nose normal.  Eyes: Right eye exhibits no discharge. Left  eye exhibits no discharge.  Cardiovascular: Normal rate and regular rhythm.   Pulses:      Dorsalis pedis pulses are 2+ on the right side, and 2+ on the left side.  Pulmonary/Chest: Effort normal.  Musculoskeletal:       Left hip: She exhibits tenderness (lateral).       Left knee: No tenderness found.  Left ankle: She exhibits normal range of motion and no swelling. Tenderness (diffuse).       Left upper leg: She exhibits no tenderness.       Left lower leg: She exhibits no tenderness.       Left foot: There is tenderness (diffuse).  Neurological: She is alert and oriented to person, place, and time.  Skin: Skin is warm and dry. She is not diaphoretic.  Nursing note and vitals reviewed.    ED Treatments / Results  Labs (all labs ordered are listed, but only abnormal results are displayed) Labs Reviewed - No data to display  EKG  EKG Interpretation None       Radiology Dg Ankle Complete Left  Result Date: 08/01/2016 CLINICAL DATA:  Pain in the left hip, knee and ankle after falling from a pallet. Previous ankle surgery. Initial encounter. EXAM: LEFT ANKLE COMPLETE - 3+ VIEW COMPARISON:  04/29/2004. FINDINGS: The mineralization and alignment are normal. There is no evidence of acute fracture or dislocation. There is stable posttraumatic deformity of the distal fibula status post screw fixation. There are some soft tissue/dermal calcifications in the lower leg. Mild spurring of the malleoli. The soft tissue surrounding the ankle are mildly prominent, similar to the prior study. IMPRESSION: No acute osseous findings. Stable posttraumatic deformity of the distal fibula. Stable soft tissue prominence, potentially edema/swelling. Electronically Signed   By: Richardean Sale M.D.   On: 08/01/2016 18:38   Dg Foot Complete Left  Result Date: 08/01/2016 CLINICAL DATA:  Pain in the left hip, knee and ankle after falling from a pallet. Previous ankle surgery. Initial encounter. EXAM:  LEFT FOOT - COMPLETE 3+ VIEW COMPARISON:  Radiographs 11/15/2012 FINDINGS: The mineralization and alignment are normal. There is no evidence of acute fracture or dislocation. There are mild midfoot and subtalar degenerative changes. Postsurgical changes are present within the distal fibula. IMPRESSION: No acute osseous findings. Electronically Signed   By: Richardean Sale M.D.   On: 08/01/2016 18:36   Dg Hip Unilat With Pelvis 2-3 Views Left  Result Date: 08/01/2016 CLINICAL DATA:  Pain in the left hip, knee and ankle after falling from a pallet. Previous ankle surgery. Initial encounter. EXAM: DG HIP (WITH OR WITHOUT PELVIS) 2-3V LEFT COMPARISON:  None. FINDINGS: The mineralization appears adequate. No evidence of acute fracture, dislocation or femoral head avascular necrosis. The hip joint spaces are maintained. The sacroiliac joints appear normal. Postsurgical changes are present within the lower lumbar spine status post fusion. Bilateral pelvic calcifications are likely phleboliths. IMPRESSION: No acute osseous findings or significant arthropathic changes. Electronically Signed   By: Richardean Sale M.D.   On: 08/01/2016 18:39    Procedures Procedures (including critical care time)  Medications Ordered in ED Medications  HYDROmorphone (DILAUDID) injection 2 mg (2 mg Intramuscular Given 08/01/16 1744)     Initial Impression / Assessment and Plan / ED Course  I have reviewed the triage vital signs and the nursing notes.  Pertinent labs & imaging results that were available during my care of the patient were reviewed by me and considered in my medical decision making (see chart for details).  Clinical Course  Comment By Time  We'll give IM Dilaudid as well as kit x-rays of ankle, foot, and hip. No obvious deformities or obvious fracture. Could be sprained. Did not have any head injury or other injuries besides her left leg. Neurovascularly intact. Sherwood Gambler, MD 08/20 1738  X-rays show no  fracture. Likely has a  left ankle sprain and left hip contusion. Pain is now 0. Will ambulate, assuming she does well will discharge home with outpatient care. Sherwood Gambler, MD 08/20 1856  Ambulated without difficulty. Discharge home with [facility].  Sherwood Gambler, MD 08/20 1929    Final Clinical Impressions(s) / ED Diagnoses   Final diagnoses:  Fall, initial encounter  Sprain of left ankle, initial encounter  Contusion of left hip, initial encounter    New Prescriptions New Prescriptions   No medications on file     Sherwood Gambler, MD 08/01/16 1930

## 2016-08-01 NOTE — ED Notes (Signed)
Placed non skip socks on pt. Pt ambulated to nurses station and returned to treatment room. Pt given ice pack for swelling.

## 2016-08-01 NOTE — ED Triage Notes (Signed)
Patient states she fell on a pallet and landed on her left side. Patient complains of left hip, knee and ankle pain. States previous surgery on left ankle with hardware in place.

## 2016-08-10 DIAGNOSIS — S93409A Sprain of unspecified ligament of unspecified ankle, initial encounter: Secondary | ICD-10-CM | POA: Diagnosis not present

## 2016-08-10 DIAGNOSIS — S93401A Sprain of unspecified ligament of right ankle, initial encounter: Secondary | ICD-10-CM | POA: Diagnosis not present

## 2016-08-10 DIAGNOSIS — M79673 Pain in unspecified foot: Secondary | ICD-10-CM | POA: Diagnosis not present

## 2016-08-22 ENCOUNTER — Emergency Department (HOSPITAL_COMMUNITY)
Admission: EM | Admit: 2016-08-22 | Discharge: 2016-08-22 | Disposition: A | Discharge status: Home / Self Care | Payer: Medicare Other | Attending: Emergency Medicine | Admitting: Emergency Medicine

## 2016-08-22 ENCOUNTER — Encounter (HOSPITAL_COMMUNITY): Payer: Self-pay | Admitting: Emergency Medicine

## 2016-08-22 ENCOUNTER — Emergency Department (HOSPITAL_COMMUNITY): Payer: Medicare Other

## 2016-08-22 DIAGNOSIS — Z87891 Personal history of nicotine dependence: Secondary | ICD-10-CM | POA: Insufficient documentation

## 2016-08-22 DIAGNOSIS — Y929 Unspecified place or not applicable: Secondary | ICD-10-CM | POA: Insufficient documentation

## 2016-08-22 DIAGNOSIS — Y999 Unspecified external cause status: Secondary | ICD-10-CM | POA: Diagnosis not present

## 2016-08-22 DIAGNOSIS — Z7984 Long term (current) use of oral hypoglycemic drugs: Secondary | ICD-10-CM | POA: Diagnosis not present

## 2016-08-22 DIAGNOSIS — Z79899 Other long term (current) drug therapy: Secondary | ICD-10-CM | POA: Diagnosis not present

## 2016-08-22 DIAGNOSIS — X58XXXA Exposure to other specified factors, initial encounter: Secondary | ICD-10-CM | POA: Diagnosis not present

## 2016-08-22 DIAGNOSIS — E119 Type 2 diabetes mellitus without complications: Secondary | ICD-10-CM | POA: Insufficient documentation

## 2016-08-22 DIAGNOSIS — R109 Unspecified abdominal pain: Secondary | ICD-10-CM | POA: Diagnosis not present

## 2016-08-22 DIAGNOSIS — R110 Nausea: Secondary | ICD-10-CM | POA: Insufficient documentation

## 2016-08-22 DIAGNOSIS — Z791 Long term (current) use of non-steroidal anti-inflammatories (NSAID): Secondary | ICD-10-CM | POA: Diagnosis not present

## 2016-08-22 DIAGNOSIS — I10 Essential (primary) hypertension: Secondary | ICD-10-CM | POA: Diagnosis not present

## 2016-08-22 DIAGNOSIS — Y939 Activity, unspecified: Secondary | ICD-10-CM | POA: Insufficient documentation

## 2016-08-22 DIAGNOSIS — J45909 Unspecified asthma, uncomplicated: Secondary | ICD-10-CM | POA: Insufficient documentation

## 2016-08-22 DIAGNOSIS — S39012A Strain of muscle, fascia and tendon of lower back, initial encounter: Secondary | ICD-10-CM | POA: Diagnosis not present

## 2016-08-22 DIAGNOSIS — K429 Umbilical hernia without obstruction or gangrene: Secondary | ICD-10-CM | POA: Diagnosis not present

## 2016-08-22 LAB — CBC WITH DIFFERENTIAL/PLATELET
Basophils Absolute: 0.1 10*3/uL (ref 0.0–0.1)
Basophils Relative: 2 %
Eosinophils Absolute: 0.1 10*3/uL (ref 0.0–0.7)
Eosinophils Relative: 2 %
HCT: 41.5 % (ref 36.0–46.0)
Hemoglobin: 13.6 g/dL (ref 12.0–15.0)
LYMPHS ABS: 2 10*3/uL (ref 0.7–4.0)
LYMPHS PCT: 38 %
MCH: 29.4 pg (ref 26.0–34.0)
MCHC: 32.8 g/dL (ref 30.0–36.0)
MCV: 89.6 fL (ref 78.0–100.0)
Monocytes Absolute: 0.6 10*3/uL (ref 0.1–1.0)
Monocytes Relative: 11 %
NEUTROS ABS: 2.6 10*3/uL (ref 1.7–7.7)
NEUTROS PCT: 47 %
Platelets: 189 10*3/uL (ref 150–400)
RBC: 4.63 MIL/uL (ref 3.87–5.11)
RDW: 12.9 % (ref 11.5–15.5)
WBC: 5.4 10*3/uL (ref 4.0–10.5)

## 2016-08-22 LAB — BASIC METABOLIC PANEL
Anion gap: 5 (ref 5–15)
BUN: 17 mg/dL (ref 6–20)
CHLORIDE: 108 mmol/L (ref 101–111)
CO2: 28 mmol/L (ref 22–32)
Calcium: 9.3 mg/dL (ref 8.9–10.3)
Creatinine, Ser: 0.81 mg/dL (ref 0.44–1.00)
GFR calc Af Amer: >60 mL/min (ref >60)
GFR calc non Af Amer: >60 mL/min (ref >60)
GLUCOSE: 104 mg/dL — AB (ref 65–99)
POTASSIUM: 4.6 mmol/L (ref 3.5–5.1)
SODIUM: 141 mmol/L (ref 135–145)

## 2016-08-22 LAB — URINALYSIS, ROUTINE W REFLEX MICROSCOPIC
Bilirubin Urine: NEGATIVE
GLUCOSE, UA: NEGATIVE mg/dL
Hgb urine dipstick: NEGATIVE
KETONES UR: NEGATIVE mg/dL
LEUKOCYTES UA: NEGATIVE
NITRITE: NEGATIVE
PROTEIN: NEGATIVE mg/dL
Specific Gravity, Urine: >1.03 — ABNORMAL HIGH (ref 1.005–1.030)
pH: 5.5 (ref 5.0–8.0)

## 2016-08-22 MED ORDER — OXYCODONE-ACETAMINOPHEN 5-325 MG PO TABS: 1.0000 | ORAL_TABLET | ORAL | 0 refills | Status: DC | PRN

## 2016-08-22 MED ORDER — OXYCODONE-ACETAMINOPHEN 5-325 MG PO TABS
1.0000 | ORAL_TABLET | Freq: Once | ORAL | Status: AC
  Administered 2016-08-22: 1 via ORAL
  Filled 2016-08-22: qty 1

## 2016-08-22 MED ORDER — CYCLOBENZAPRINE HCL 5 MG PO TABS: 5.0000 mg | ORAL_TABLET | Freq: Three times a day (TID) | ORAL | 0 refills | Status: DC | PRN

## 2016-08-22 NOTE — ED Provider Notes (Signed)
[facility]-EMERGENCY DEPT Provider Note   CSN: [id number] Arrival date & time: 08/22/16  1728     History   Chief Complaint Chief Complaint  Patient presents with  . Flank Pain    HPI [name] is a 60 y.o. female with past medical history of chronic low back  pain with low back surgery years ago when in [place] presenting with a now 2 day history of left flank pain in addition to mild nausea and a chilled feeling, but only when she urinates.  She denies dysuria, hematuria, urgency or increased frequency beyond her normal amount with her overactive bladder.  She denies documented fevers and has had no abdominal or pelvic pain, and denies vaginal discharge.  She endorses her pain is worsened with movement and certain positions.  She denies personal history of kidney stones.    Patient denies any new injuries and there has been no radiation of pain into her legs.  There has been no weakness or numbness in the lower extremities and no urinary or bowel retention or incontinence. She has found no alleviators.  The history is provided by the patient and the spouse.    Past Medical History:  Diagnosis Date  . Asthma   . Chronic back pain   . Chronic leg pain    bilateral  . Chronic pelvic pain in female   . Diabetes mellitus   . Fibromyalgia   . H/O cardiac catheterization    No significant CAD (only 20% LAD) 03/02/13 with normal LV function.  Marland Kitchen Headache   . High cholesterol   . Hypertension   . OAB (overactive bladder) 05/21/2013  . Obstructive sleep apnea    Noncompliant with CPAP due to claustophobia.  . Overactive bladder   . Panic attacks   . Rectocele 05/21/2013  . Seizures ([facility])    Had 1 with negative W/U 08/2014  . Thyroid disease     Patient Active Problem List   Diagnosis Date Noted  . Idiopathic intracranial hypertension 11/19/2015  . Papilledema 09/08/2015  . Left facial numbness 08/22/2015  . Epilepsy, generalized, convulsive ([facility]) 06/05/2015  . Obstructive sleep  apnea 06/05/2015  . Headache, occipital 06/05/2015  . Neck pain 06/05/2015  . Occipital neuralgia of right side 06/05/2015  . Seizure ([facility]) 07/21/2014  . Rectocele 05/21/2013  . OAB (overactive bladder) 05/21/2013  . Difficulty in walking(719.7) 04/11/2013  . Leg weakness, bilateral 04/11/2013  . Chest pain, unspecified 02/28/2013  . Diabetes ([facility]) 02/28/2013  . Morbid obesity ([facility]) 02/28/2013  . Other and unspecified hyperlipidemia 02/28/2013  . CHRONIC OSTEOMYELITIS ANKLE AND FOOT 07/22/2009  . Hypertension 07/22/2009    Past Surgical History:  Procedure Laterality Date  . ABDOMINAL HYSTERECTOMY    . ANKLE SURGERY    . BACK SURGERY    . CARDIAC CATHETERIZATION  02/2013   no significant coronary artery disease  . CARPAL TUNNEL RELEASE    . knee replacements     bilateral  . LEFT HEART CATHETERIZATION WITH CORONARY ANGIOGRAM N/A 03/02/2013   Procedure: LEFT HEART CATHETERIZATION WITH CORONARY ANGIOGRAM;  Surgeon: Peter M [place]az[place]and, MD;  Location: [facility] CATH LAB;  Service: Cardiovascular;  Laterality: N/A;  . TONSILLECTOMY    . TUBAL LIGATION      OB History    Gravida Para Term Preterm AB Living   2 2 2     2    SAB TAB Ectopic Multiple Live Births  Home Medications    Prior to Admission medications   Medication Sig Start Date End Date Taking? Authorizing Provider  albuterol (PROVENTIL HFA;VENTOLIN HFA) 108 (90 Base) MCG/ACT inhaler Inhale 1-2 puffs into the lungs every 6 (six) hours as needed for wheezing or shortness of breath.    Historical Provider, MD  atorvastatin (LIPITOR) 40 MG tablet Take 40 mg by mouth at bedtime.  10/15/14   Historical Provider, MD  BELSOMRA 15 MG TABS Take 15 mg by mouth at bedtime as needed (for sleep). Takes 1 tab every other night 04/03/16   Historical Provider, MD  cyclobenzaprine (FLEXERIL) 5 MG tablet Take 1 tablet (5 mg total) by mouth 3 (three) times daily as needed for muscle spasms. 08/22/16   Burgess Amor, PA-C  ibuprofen  (ADVIL,MOTRIN) 200 MG tablet Take 200 mg by mouth daily as needed for headache or mild pain.     Historical Provider, MD  imipramine (TOFRANIL) 50 MG tablet TAKE (1) TABLET BY MOUTH AT BEDTIME. 04/13/16   Adline Potter, NP  levETIRAcetam (KEPPRA) 500 MG tablet Take 1 tablet (500 mg total) by mouth 2 (two) times daily. 05/19/16   Asa Lente, MD  levothyroxine (SYNTHROID, LEVOTHROID) 88 MCG tablet Take 88 mcg by mouth daily before breakfast.    Historical Provider, MD  metFORMIN (GLUCOPHAGE) 500 MG tablet Take 500 mg by mouth 2 (two) times daily with a meal.  03/26/14   Historical Provider, MD  metoprolol (LOPRESSOR) 50 MG tablet Take 50 mg by mouth 2 (two) times daily.      Historical Provider, MD  oxybutynin (DITROPAN) 5 MG tablet Take 1 tablet (5 mg total) by mouth 2 (two) times daily. 04/13/16   Adline Potter, NP  oxyCODONE-acetaminophen (PERCOCET/ROXICET) 5-325 MG tablet Take 1 tablet by mouth every 4 (four) hours as needed. 08/22/16   Burgess Amor, PA-C  RESTASIS 0.05 % ophthalmic emulsion Place 1 drop into both eyes 2 (two) times daily.  08/14/15   Historical Provider, MD    Family History Family History  Problem Relation Age of Onset  . Heart attack Mother   . Diabetes Mother   . Hypertension Mother   . Sick sinus syndrome Brother     Pacemaker  . Congenital heart disease Brother   . Stroke Brother   . Coronary artery disease Brother   . Cancer Maternal Aunt     breast, liver,lung  . Cancer Cousin     leukemia    Social History Social History  Substance Use Topics  . Smoking status: Former Smoker    Packs/day: 1.00    Years: 2.00    Types: Cigarettes    Quit date: 12/13/1974  . Smokeless tobacco: Never Used  . Alcohol use No     Allergies   Aspirin and Codeine   Review of Systems Review of Systems  Constitutional: Positive for chills. Negative for fever.  Respiratory: Negative for shortness of breath.   Cardiovascular: Negative for chest pain and leg swelling.    Gastrointestinal: Positive for nausea. Negative for abdominal distention, abdominal pain, constipation and vomiting.  Genitourinary: Negative for difficulty urinating, dysuria, flank pain, frequency and urgency.  Musculoskeletal: Positive for back pain. Negative for gait problem and joint swelling.  Skin: Negative for rash.  Neurological: Negative for weakness and numbness.     Physical Exam Updated Vital Signs BP 150/81 (BP Location: Left Arm)   Pulse 65   Temp 98 F (36.7 C) (Oral)   Resp 16  SpO2 100%   Physical Exam  Constitutional: She appears well-developed and well-nourished.  HENT:  Head: Normocephalic and atraumatic.  Eyes: Conjunctivae are normal.  Neck: Normal range of motion. Neck supple.  Cardiovascular: Normal rate, regular rhythm, normal heart sounds and intact distal pulses.   Pedal pulses normal.  Pulmonary/Chest: Effort normal and breath sounds normal. She has no wheezes.  Abdominal: Soft. Bowel sounds are normal. She exhibits no distension and no mass. There is no tenderness.  Musculoskeletal: Normal range of motion. She exhibits no edema.       Lumbar back: She exhibits tenderness. She exhibits no swelling, no edema and no spasm.  ttp left lower back and cva tenderness. No midline pain, no edema or erythema.   Neurological: She is alert. She has normal strength. She displays no atrophy and no tremor. No sensory deficit. Gait normal.  Reflex Scores:      Patellar reflexes are 2+ on the right side and 2+ on the left side.      Achilles reflexes are 2+ on the right side and 2+ on the left side. No strength deficit noted in hip and knee flexor and extensor muscle groups.  Ankle flexion and extension intact.  Skin: Skin is warm and dry.  Psychiatric: She has a normal mood and affect.  Nursing note and vitals reviewed.    ED Treatments / Results  Labs (all labs ordered are listed, but only abnormal results are displayed) Labs Reviewed  URINALYSIS, ROUTINE  W REFLEX MICROSCOPIC (NOT AT [facility]) - Abnormal; Notable for the following:       Result Value   Specific Gravity, Urine >1.030 (*)    All other components within normal limits  BASIC METABOLIC PANEL - Abnormal; Notable for the following:    Glucose, Bld 104 (*)    All other components within normal limits  CBC WITH DIFFERENTIAL/PLATELET    EKG  EKG Interpretation None       Radiology Ct Renal Stone Study  Result Date: 08/22/2016 CLINICAL DATA:  Left flank pain, onset yesterday. EXAM: CT ABDOMEN AND PELVIS WITHOUT CONTRAST TECHNIQUE: Multidetector CT imaging of the abdomen and pelvis was performed following the standard protocol without IV contrast. COMPARISON:  01/19/2015 FINDINGS: Lower chest: No significant abnormality. Mild stable scarring in the right middle lobe. Hepatobiliary: There are normal appearances of the liver, gallbladder and bile ducts. Pancreas: Normal Spleen: Normal except for a few granulomata. Adrenals/Urinary Tract: There is a sub cm isodense lesion at the posterior aspect of the left renal midpole, stable over the past two examinations dating back to 2011. The adrenals and kidneys are otherwise normal in appearance. There is no urinary calculus evident. There is no hydronephrosis or ureteral dilatation. Collecting systems and ureters appear unremarkable. Stomach/Bowel: There are unremarkable appearances of the stomach, small bowel and colon. Vascular/Lymphatic: The abdominal aorta is normal in caliber. There is mild atherosclerotic calcification. There is no adenopathy in the abdomen or pelvis. Reproductive: Prior hysterectomy.  No adnexal abnormality. Other: No acute inflammatory changes are evident in the abdomen or pelvis. No ascites. Small fat containing umbilical hernia. Musculoskeletal: No significant skeletal lesions. Prior L4-5 posterior decompression with instrumented fusion and interbody fusion cage. IMPRESSION: 1. No acute findings are evident in the abdomen or  pelvis. 2. Small fat containing umbilical hernia. Electronically Signed   By: Ellery Plunk M.D.   On: 08/22/2016 22:41    Procedures Procedures (including critical care time)  Medications Ordered in ED Medications  oxyCODONE-acetaminophen (PERCOCET/ROXICET) 5-325  MG per tablet 1 tablet (1 tablet Oral Given 08/22/16 2047)     Initial Impression / Assessment and Plan / ED Course  I have reviewed the triage vital signs and the nursing notes.  Pertinent labs & imaging results that were available during my care of the patient were reviewed by me and considered in my medical decision making (see chart for details).  Clinical Course    Labs and imaging reviewed. Pt with reproducible pain suspected to be body wall/low back pain. CT negative for kidney stones.  She was placed on oxycodone and flexeril.  She was advised heat tx, f/u with pcp for a recheck for any new or worsened sx.    No neuro deficit on exam or by history to suggest emergent or surgical presentation.  discussed worsened sx that should prompt immediate re-evaluation including distal weakness, bowel/bladder retention/incontinence.    Final Clinical Impressions(s) / ED Diagnoses   Final diagnoses:  Flank pain  Lumbar strain, initial encounter    New Prescriptions New Prescriptions   CYCLOBENZAPRINE (FLEXERIL) 5 MG TABLET    Take 1 tablet (5 mg total) by mouth 3 (three) times daily as needed for muscle spasms.   OXYCODONE-ACETAMINOPHEN (PERCOCET/ROXICET) 5-325 MG TABLET    Take 1 tablet by mouth every 4 (four) hours as needed.     Burgess Amor, PA-C 08/23/16 0210    Vanetta Mulders, MD 08/25/16 [phone]

## 2016-08-22 NOTE — ED Notes (Signed)
Patient verbalizes understanding of discharge instructions, prescriptions, home care and follow up care if needed. Patient ambulatory out of department at this time.

## 2016-08-22 NOTE — Discharge Instructions (Signed)
.    Do not drive within 4 hours of taking oxycodone as this will make you drowsy.  Avoid lifting,  Bending,  Twisting or any other activity that worsens your pain over the next week.  Apply an  icepack  to your lower back for 10-15 minutes every 2 hours for the next 2 days.  You should get rechecked if your symptoms are not better over the next 5 days,  Or you develop increased pain,  Weakness in your leg(s) or loss of bladder or bowel function - these are symptoms of a worse injury. ° ° °

## 2016-08-22 NOTE — ED Triage Notes (Signed)
Patient c/o left flank pain that started yesterday. Per patient chills with urination and some nausea. Denies any fevers or other urinary symptoms.

## 2016-09-23 DIAGNOSIS — E119 Type 2 diabetes mellitus without complications: Secondary | ICD-10-CM | POA: Diagnosis not present

## 2016-09-23 DIAGNOSIS — E039 Hypothyroidism, unspecified: Secondary | ICD-10-CM | POA: Diagnosis not present

## 2016-09-27 DIAGNOSIS — E039 Hypothyroidism, unspecified: Secondary | ICD-10-CM | POA: Diagnosis not present

## 2016-09-27 DIAGNOSIS — G4733 Obstructive sleep apnea (adult) (pediatric): Secondary | ICD-10-CM | POA: Diagnosis not present

## 2016-09-27 DIAGNOSIS — E782 Mixed hyperlipidemia: Secondary | ICD-10-CM | POA: Diagnosis not present

## 2016-09-27 DIAGNOSIS — E119 Type 2 diabetes mellitus without complications: Secondary | ICD-10-CM | POA: Diagnosis not present

## 2016-09-27 DIAGNOSIS — Z23 Encounter for immunization: Secondary | ICD-10-CM | POA: Diagnosis not present

## 2016-09-27 DIAGNOSIS — I10 Essential (primary) hypertension: Secondary | ICD-10-CM | POA: Diagnosis not present

## 2016-10-04 ENCOUNTER — Observation Stay (HOSPITAL_COMMUNITY)
Admission: EM | Admit: 2016-10-04 | Discharge: 2016-10-05 | Disposition: A | Discharge status: Home / Self Care | Payer: Medicare Other | Attending: Internal Medicine | Admitting: Internal Medicine

## 2016-10-04 ENCOUNTER — Emergency Department (HOSPITAL_COMMUNITY): Payer: Medicare Other

## 2016-10-04 ENCOUNTER — Encounter (HOSPITAL_COMMUNITY): Payer: Self-pay | Admitting: Emergency Medicine

## 2016-10-04 DIAGNOSIS — J45909 Unspecified asthma, uncomplicated: Secondary | ICD-10-CM | POA: Insufficient documentation

## 2016-10-04 DIAGNOSIS — Z87891 Personal history of nicotine dependence: Secondary | ICD-10-CM | POA: Insufficient documentation

## 2016-10-04 DIAGNOSIS — E039 Hypothyroidism, unspecified: Secondary | ICD-10-CM | POA: Diagnosis present

## 2016-10-04 DIAGNOSIS — I10 Essential (primary) hypertension: Secondary | ICD-10-CM | POA: Diagnosis present

## 2016-10-04 DIAGNOSIS — E785 Hyperlipidemia, unspecified: Secondary | ICD-10-CM

## 2016-10-04 DIAGNOSIS — R079 Chest pain, unspecified: Secondary | ICD-10-CM | POA: Diagnosis not present

## 2016-10-04 DIAGNOSIS — E119 Type 2 diabetes mellitus without complications: Secondary | ICD-10-CM | POA: Diagnosis not present

## 2016-10-04 DIAGNOSIS — Z79899 Other long term (current) drug therapy: Secondary | ICD-10-CM | POA: Insufficient documentation

## 2016-10-04 DIAGNOSIS — Z7984 Long term (current) use of oral hypoglycemic drugs: Secondary | ICD-10-CM | POA: Diagnosis not present

## 2016-10-04 DIAGNOSIS — R0789 Other chest pain: Principal | ICD-10-CM | POA: Insufficient documentation

## 2016-10-04 HISTORY — DX: Type 2 diabetes mellitus without complications: E11.9

## 2016-10-04 HISTORY — DX: Essential (primary) hypertension: I10

## 2016-10-04 HISTORY — DX: Hypothyroidism, unspecified: E03.9

## 2016-10-04 LAB — CBC
HCT: 41.3 % (ref 36.0–46.0)
Hemoglobin: 13.6 g/dL (ref 12.0–15.0)
MCH: 29.3 pg (ref 26.0–34.0)
MCHC: 32.9 g/dL (ref 30.0–36.0)
MCV: 89 fL (ref 78.0–100.0)
PLATELETS: 174 10*3/uL (ref 150–400)
RBC: 4.64 MIL/uL (ref 3.87–5.11)
RDW: 12.6 % (ref 11.5–15.5)
WBC: 6.2 10*3/uL (ref 4.0–10.5)

## 2016-10-04 LAB — BASIC METABOLIC PANEL
Anion gap: 5 (ref 5–15)
BUN: 11 mg/dL (ref 6–20)
CHLORIDE: 105 mmol/L (ref 101–111)
CO2: 28 mmol/L (ref 22–32)
CREATININE: 0.83 mg/dL (ref 0.44–1.00)
Calcium: 9.2 mg/dL (ref 8.9–10.3)
Glucose, Bld: 85 mg/dL (ref 65–99)
POTASSIUM: 4.2 mmol/L (ref 3.5–5.1)
SODIUM: 138 mmol/L (ref 135–145)

## 2016-10-04 LAB — GLUCOSE, CAPILLARY: GLUCOSE-CAPILLARY: 79 mg/dL (ref 65–99)

## 2016-10-04 LAB — D-DIMER, QUANTITATIVE (NOT AT ARMC): D DIMER QUANT: 0.27 ug{FEU}/mL (ref 0.00–0.50)

## 2016-10-04 LAB — TROPONIN I: Troponin I: <0.03 ng/mL (ref <0.03)

## 2016-10-04 MED ORDER — ONDANSETRON HCL 4 MG/2ML IJ SOLN: 4.0000 mg | Freq: Four times a day (QID) | INTRAMUSCULAR | Status: DC | PRN

## 2016-10-04 MED ORDER — SODIUM CHLORIDE 0.9 % IV BOLUS (SEPSIS)
500.0000 mL | Freq: Once | INTRAVENOUS | Status: AC
  Administered 2016-10-04: 500 mL via INTRAVENOUS

## 2016-10-04 MED ORDER — METOPROLOL TARTRATE 50 MG PO TABS
50.0000 mg | ORAL_TABLET | Freq: Two times a day (BID) | ORAL | Status: DC
  Administered 2016-10-04 – 2016-10-05 (×2): 50 mg via ORAL
  Filled 2016-10-04 (×2): qty 1

## 2016-10-04 MED ORDER — LEVETIRACETAM 500 MG PO TABS
500.0000 mg | ORAL_TABLET | Freq: Two times a day (BID) | ORAL | Status: DC
  Administered 2016-10-04: 500 mg via ORAL
  Filled 2016-10-04: qty 1

## 2016-10-04 MED ORDER — NITROGLYCERIN 0.4 MG SL SUBL
SUBLINGUAL_TABLET | SUBLINGUAL | Status: AC
  Filled 2016-10-04: qty 1

## 2016-10-04 MED ORDER — ASPIRIN 81 MG PO CHEW
324.0000 mg | CHEWABLE_TABLET | Freq: Once | ORAL | Status: AC
  Administered 2016-10-04: 324 mg via ORAL
  Filled 2016-10-04: qty 4

## 2016-10-04 MED ORDER — ACETAMINOPHEN 325 MG PO TABS
650.0000 mg | ORAL_TABLET | ORAL | Status: DC | PRN
  Administered 2016-10-04: 650 mg via ORAL
  Filled 2016-10-04: qty 2

## 2016-10-04 MED ORDER — ZOLPIDEM TARTRATE 5 MG PO TABS: 5.0000 mg | ORAL_TABLET | Freq: Every evening | ORAL | Status: DC | PRN

## 2016-10-04 MED ORDER — MORPHINE SULFATE (PF) 2 MG/ML IV SOLN: 2.0000 mg | INTRAVENOUS | Status: DC | PRN

## 2016-10-04 MED ORDER — INSULIN ASPART 100 UNIT/ML ~~LOC~~ SOLN: 0.0000 [IU] | Freq: Three times a day (TID) | SUBCUTANEOUS | Status: DC

## 2016-10-04 MED ORDER — MORPHINE SULFATE (PF) 4 MG/ML IV SOLN
4.0000 mg | INTRAVENOUS | Status: DC | PRN
  Filled 2016-10-04: qty 1

## 2016-10-04 MED ORDER — ENOXAPARIN SODIUM 60 MG/0.6ML ~~LOC~~ SOLN
60.0000 mg | SUBCUTANEOUS | Status: DC
  Administered 2016-10-04: 60 mg via SUBCUTANEOUS
  Filled 2016-10-04: qty 0.6

## 2016-10-04 MED ORDER — LEVOTHYROXINE SODIUM 100 MCG PO TABS: 100.0000 ug | ORAL_TABLET | Freq: Every day | ORAL | Status: DC

## 2016-10-04 MED ORDER — CYCLOSPORINE 0.05 % OP EMUL
OPHTHALMIC | Status: AC
  Filled 2016-10-04: qty 1

## 2016-10-04 MED ORDER — ATORVASTATIN CALCIUM 40 MG PO TABS
40.0000 mg | ORAL_TABLET | Freq: Every day | ORAL | Status: DC
  Administered 2016-10-04: 40 mg via ORAL
  Filled 2016-10-04: qty 1

## 2016-10-04 MED ORDER — CYCLOSPORINE 0.05 % OP EMUL
1.0000 [drp] | Freq: Two times a day (BID) | OPHTHALMIC | Status: DC
  Administered 2016-10-04 – 2016-10-05 (×2): 1 [drp] via OPHTHALMIC
  Filled 2016-10-04 (×4): qty 1

## 2016-10-04 MED ORDER — SUVOREXANT 15 MG PO TABS: 15.0000 mg | ORAL_TABLET | Freq: Every evening | ORAL | Status: DC | PRN

## 2016-10-04 MED ORDER — NITROGLYCERIN 2 % TD OINT
1.0000 [in_us] | TOPICAL_OINTMENT | Freq: Once | TRANSDERMAL | Status: AC
Start: 2016-10-04 — End: 2016-10-04
  Administered 2016-10-04: 1 [in_us] via TOPICAL
  Filled 2016-10-04: qty 1

## 2016-10-04 MED ORDER — NITROGLYCERIN 0.4 MG SL SUBL
0.4000 mg | SUBLINGUAL_TABLET | SUBLINGUAL | Status: AC | PRN
  Administered 2016-10-04 (×3): 0.4 mg via SUBLINGUAL
  Filled 2016-10-04: qty 1

## 2016-10-04 MED ORDER — SODIUM CHLORIDE 0.9 % IV SOLN: INTRAVENOUS | Status: DC

## 2016-10-04 MED ORDER — OXYBUTYNIN CHLORIDE 5 MG PO TABS
5.0000 mg | ORAL_TABLET | Freq: Two times a day (BID) | ORAL | Status: DC
  Administered 2016-10-04: 5 mg via ORAL
  Filled 2016-10-04: qty 1

## 2016-10-04 MED ORDER — IMIPRAMINE HCL 25 MG PO TABS
50.0000 mg | ORAL_TABLET | Freq: Every day | ORAL | Status: DC
Start: 2016-10-04 — End: 2016-10-05
  Administered 2016-10-04: 50 mg via ORAL
  Filled 2016-10-04: qty 2

## 2016-10-04 MED ORDER — GI COCKTAIL ~~LOC~~: 30.0000 mL | Freq: Four times a day (QID) | ORAL | Status: DC | PRN

## 2016-10-04 NOTE — ED Notes (Signed)
edp aware bp 90s sys after ntg and unable to give morphine at this time. Pt rating pain 6

## 2016-10-04 NOTE — ED Triage Notes (Signed)
Patient complaining of sharp mid sternal chest pain radiating into right side of chest and right neck starting approximately 1 hour prior to arrival to ER.

## 2016-10-04 NOTE — H&P (Signed)
History and Physical    ANTONELA FREIMAN HLK:[id number] DOB: 12/02/1956 DOA: 10/04/2016  PCP: Dwana Melena, MD Consultants:  Neurology; [facility] Patient coming from: lives with husband, daughter, son-in-law, granddaughter, foster child  Chief Complaint: chest pain  HPI: [name] is a 60 y.o. female with medical history significant of OSA (no CPAP), HTN, HLD, DM, fibromylagia, chronic pain presenting with chest pain which started this AM.  Also experienced similar chest pain Saturday and yesterday.  She reports that pains today were different, husband disagrees.  Also with R sided facial numbness.  Substernal, non-exertional, constant until given NTG in ER.  +SOB.  No diaphoresis.  No nausea.   ED Course: Per Dr. Clarene Duke: 7342:  Improved after ASA and ntg. Will observation admit. T/C to [facility] Dr. Ophelia Charter, case discussed, including:  HPI, pertinent PM/SHx, VS/PE, dx testing, ED course and treatment:  Agreeable to admit, requests to write temporary orders, obtain observation tele bed to team APAdmits.  Review of Systems: As per HPI; otherwise 10 point review of systems reviewed and negative.   Ambulatory Status:  Ambulates without assistance  Past Medical History:  Diagnosis Date  . Asthma   . Chronic back pain   . Chronic leg pain    bilateral  . Chronic pelvic pain in female   . Diabetes mellitus   . Fibromyalgia   . H/O cardiac catheterization    No significant CAD (only 20% LAD) 03/02/13 with normal LV function.  Marland Kitchen Headache   . High cholesterol   . Hypertension   . OAB (overactive bladder) 05/21/2013  . Obstructive sleep apnea    Noncompliant with CPAP due to claustophobia.  . Panic attacks   . Rectocele 05/21/2013  . Seizures (HCC)    Had 1 with negative W/U 08/2014  . Thyroid disease     Past Surgical History:  Procedure Laterality Date  . ABDOMINAL HYSTERECTOMY    . ANKLE SURGERY    . BACK SURGERY    . CARPAL TUNNEL RELEASE    . knee replacements     bilateral   . LEFT HEART CATHETERIZATION WITH CORONARY ANGIOGRAM N/A 03/02/2013   Procedure: LEFT HEART CATHETERIZATION WITH CORONARY ANGIOGRAM;  Surgeon: Peter M Swaziland, MD;  Location: [facility] CATH LAB;  Service: Cardiovascular;  Laterality: N/A;  . TONSILLECTOMY    . TUBAL LIGATION      Social History   Social History  . Marital status: Married    Spouse name: N/A  . Number of children: N/A  . Years of education: N/A   Occupational History  . disabled    Social History Main Topics  . Smoking status: Former Smoker    Packs/day: 1.00    Years: 2.00    Types: Cigarettes    Quit date: 12/13/1974  . Smokeless tobacco: Never Used  . Alcohol use No  . Drug use: No  . Sexual activity: Not Currently    Birth control/ protection: Surgical     Comment: hyst   Other Topics Concern  . Not on file   Social History Narrative  . No narrative on file    Allergies  Allergen Reactions  . Aspirin Nausea And Vomiting  . Codeine Rash    Family History  Problem Relation Age of Onset  . Heart attack Mother   . Diabetes Mother   . Hypertension Mother   . Sick sinus syndrome Brother     Pacemaker  . Congenital heart disease Brother   . Stroke  Brother   . Coronary artery disease Brother   . Cancer Maternal Aunt     breast, liver,lung  . Cancer Cousin     leukemia    Prior to Admission medications   Medication Sig Start Date End Date Taking? Authorizing Provider  albuterol (PROVENTIL HFA;VENTOLIN HFA) 108 (90 Base) MCG/ACT inhaler Inhale 1-2 puffs into the lungs every 6 (six) hours as needed for wheezing or shortness of breath.   Yes Historical Provider, MD  atorvastatin (LIPITOR) 40 MG tablet Take 40 mg by mouth at bedtime.  10/15/14  Yes Historical Provider, MD  BELSOMRA 15 MG TABS Take 15 mg by mouth at bedtime as needed (for sleep). Takes 1 tab every other night 04/03/16  Yes Historical Provider, MD  ibuprofen (ADVIL,MOTRIN) 200 MG tablet Take 200 mg by mouth daily as needed for headache or  mild pain.    Yes Historical Provider, MD  imipramine (TOFRANIL) 50 MG tablet TAKE (1) TABLET BY MOUTH AT BEDTIME. 04/13/16  Yes Adline Potter, NP  levETIRAcetam (KEPPRA) 500 MG tablet Take 1 tablet (500 mg total) by mouth 2 (two) times daily. 05/19/16  Yes Asa Lente, MD  levothyroxine (SYNTHROID, LEVOTHROID) 88 MCG tablet Take 88 mcg by mouth daily before breakfast.   Yes Historical Provider, MD  metFORMIN (GLUCOPHAGE) 500 MG tablet Take 500 mg by mouth 2 (two) times daily with a meal.  03/26/14  Yes Historical Provider, MD  metoprolol (LOPRESSOR) 50 MG tablet Take 50 mg by mouth 2 (two) times daily.     Yes Historical Provider, MD  oxybutynin (DITROPAN) 5 MG tablet Take 1 tablet (5 mg total) by mouth 2 (two) times daily. 04/13/16  Yes Adline Potter, NP  RESTASIS 0.05 % ophthalmic emulsion Place 1 drop into both eyes 2 (two) times daily.  08/14/15  Yes Historical Provider, MD  cyclobenzaprine (FLEXERIL) 5 MG tablet Take 1 tablet (5 mg total) by mouth 3 (three) times daily as needed for muscle spasms. Patient not taking: Reported on 10/04/2016 08/22/16   Burgess Amor, PA-C  oxyCODONE-acetaminophen (PERCOCET/ROXICET) 5-325 MG tablet Take 1 tablet by mouth every 4 (four) hours as needed. Patient not taking: Reported on 10/04/2016 08/22/16   Burgess Amor, PA-C    Physical Exam: Vitals:   10/04/16 1800 10/04/16 1912 10/04/16 2008 10/04/16 2045  BP: 118/63 115/66 116/79   Pulse: (!) 54 61 61   Resp: 18 18 18    Temp:  98.2 F (36.8 C) 98.3 F (36.8 C)   TempSrc:  Oral Oral   SpO2: 98% 97% 100% 93%  Weight:  124.8 kg (275 lb 1.6 oz)    Height:  5\' 7"  (1.702 m)       General:  Appears calm and comfortable and is NAD Eyes:  PERRL, EOMI, normal lids, iris ENT:  grossly normal hearing, lips & tongue, mmm Neck:  no LAD, masses or thyromegaly Cardiovascular:  RRR, no m/r/g. No LE edema.  Respiratory:  CTA bilaterally, no w/r/r. Normal respiratory effort. Abdomen:  soft, ntnd, NABS Skin:  no  rash or induration seen on limited exam Musculoskeletal:  grossly normal tone BUE/BLE, good ROM, no bony abnormality Psychiatric:  grossly normal mood and affect, speech fluent and appropriate, AOx3 Neurologic:  CN 2-12 grossly intact, moves all extremities in coordinated fashion, sensation intact  Labs on Admission: I have personally reviewed following labs and imaging studies  CBC:  Recent Labs Lab 10/04/16 1447  WBC 6.2  HGB 13.6  HCT 41.3  MCV  89.0  PLT 174   Basic Metabolic Panel:  Recent Labs Lab 10/04/16 1447  NA 138  K 4.2  CL 105  CO2 28  GLUCOSE 85  BUN 11  CREATININE 0.83  CALCIUM 9.2   GFR: Estimated Creatinine Clearance: 98.9 mL/min (by C-G formula based on SCr of 0.83 mg/dL). Liver Function Tests: No results for input(s): AST, ALT, ALKPHOS, BILITOT, PROT, ALBUMIN in the last 168 hours. No results for input(s): LIPASE, AMYLASE in the last 168 hours. No results for input(s): AMMONIA in the last 168 hours. Coagulation Profile: No results for input(s): INR, PROTIME in the last 168 hours. Cardiac Enzymes:  Recent Labs Lab 10/04/16 1447 10/04/16 2018  TROPONINI <0.03 <0.03   BNP (last 3 results) No results for input(s): PROBNP in the last 8760 hours. HbA1C: No results for input(s): HGBA1C in the last 72 hours. CBG:  Recent Labs Lab 10/04/16 2108  GLUCAP 79   Lipid Profile: No results for input(s): CHOL, HDL, LDLCALC, TRIG, CHOLHDL, LDLDIRECT in the last 72 hours. Thyroid Function Tests: No results for input(s): TSH, T4TOTAL, FREET4, T3FREE, THYROIDAB in the last 72 hours. Anemia Panel: No results for input(s): VITAMINB12, FOLATE, FERRITIN, TIBC, IRON, RETICCTPCT in the last 72 hours. Urine analysis:    Component Value Date/Time   COLORURINE YELLOW 08/22/2016 2024   APPEARANCEUR CLEAR 08/22/2016 2024   LABSPEC >1.030 (H) 08/22/2016 2024   PHURINE 5.5 08/22/2016 2024   GLUCOSEU NEGATIVE 08/22/2016 2024   HGBUR NEGATIVE 08/22/2016 2024    BILIRUBINUR NEGATIVE 08/22/2016 2024   KETONESUR NEGATIVE 08/22/2016 2024   PROTEINUR NEGATIVE 08/22/2016 2024   UROBILINOGEN 0.2 08/22/2015 2007   NITRITE NEGATIVE 08/22/2016 2024   LEUKOCYTESUR NEGATIVE 08/22/2016 2024    Creatinine Clearance: Estimated Creatinine Clearance: 98.9 mL/min (by C-G formula based on SCr of 0.83 mg/dL).  Sepsis Labs: @LABRCNTIP (procalcitonin:4,lacticidven:4) )No results found for this or any previous visit (from the past 240 hour(s)).   Radiological Exams on Admission: Dg Chest 2 View  Result Date: 10/04/2016 CLINICAL DATA:  RIGHT-sided chest pain today.  Asthma. EXAM: CHEST  2 VIEW COMPARISON:  01/25/2016. FINDINGS: Unremarkable cardiomediastinal silhouette. Peribronchial thickening is stable. No focal infiltrates or edema. No pulmonary mass. No pneumothorax or pleural effusion. Osseous spurring in the thoracic spine. IMPRESSION: Stable chest.  No active disease. Electronically Signed   By: 03/24/2016 M.D.   On: 10/04/2016 13:55    EKG: Independently reviewed.  NSR with rate 62;  no evidence of acute ischemia  Assessment/Plan Principal Problem:   Chest pain Active Problems:   Hypertension   Diabetes (HCC)   Hyperlipidemia   Hypothyroidism   Chest pain -Patient with substernal chest pain that came on at rest and was constant until it resolved with NTG/ASA in the ER. -2/3 typical symptoms suggestive of atypical chest pain.  -CXR unremarkable.   -Initial cardiac enzymes negative.   -EKG not indicative of acute ischemia.   -TIMI risk score is 3; which predicts a 14 days risk of death, recurrent MI, or urgent revascularization of 13.2%.  -Will plan to place in observation status on telemetry to rule out ACS by overnight observation.  -cycle cardiac enzymes q6h x 3 and repeat EKG in AM -ASA daily -morphine given -Takes Lipitor 40 mg daily -Risk factor stratification with FLP and HgbA1c and TSH recently done by PCP, will not recheck at this  time -Negative cath (<20% stenosis) in 2014 so stress testing may not be indicated -Will defer to day team for final  decision tomorrow  DM -Hold Glucophage -Cover with SSI  HTN -Continue Lopressor -Does not appear to be on ACE/ARB despite h/o DM, would suggest consideration of this as an outpatient  HLD -Continue Lipitor  Hypothyroidism -Patient reports that TSH was high and Synthroid was increased by PCP from 88 mcg daily to 100 mcg daily -She has been unable to afford to pick this medication up until the first of the month -Will go ahead and order the new dose here as an inpatient   DVT prophylaxis: Lovenox Code Status:  Full - confirmed with patient/family Family Communication: Husband present throughout evaluation  Disposition Plan:  Home once clinically improved Consults called: None  Admission status: It is my clinical opinion that referral for OBSERVATION is reasonable and necessary in this patient based on the above information provided. The aforementioned taken together are felt to place the patient at high risk for further clinical deterioration. However it is anticipated that the patient may be medically stable for discharge from the hospital within 24 to 48 hours.     Jonah Blue MD [facility]  If 7PM-7AM, please contact night-coverage [email or web address] Password [email or web address]  10/04/2016, 10:06 PM

## 2016-10-04 NOTE — ED Notes (Signed)
Pt rating pain 3. States does not need anything for pain at this time. Will hold morphine

## 2016-10-04 NOTE — ED Notes (Signed)
Iv atempt x2 unsuccessful.

## 2016-10-04 NOTE — ED Notes (Signed)
EKG given to Dr. McManus.  

## 2016-10-04 NOTE — ED Provider Notes (Signed)
[facility]-EMERGENCY DEPT Provider Note   CSN: [id number] Arrival date & time: 10/04/16  1302     History   Chief Complaint Chief Complaint  Patient presents with  . Chest Pain    HPI [name] is a 60 y.o. female.  HPI  Pt was seen at 1405. Per pt, c/o gradual onset and worsening of persistent mid-sternal chest "pain" that began approximately 1 hour PTA. Pt describes the pain as "sharp" and "severe," with radiation into her right chest and neck. Pt states the CP worsened with ambulation/exertion. Denies increase in pain with palpation or body movements. Denies hx of similar pain. Denies palpitations, no cough, no back pain, no abd pain, no N/V/D, no fevers.   Past Medical History:  Diagnosis Date  . Asthma   . Chronic back pain   . Chronic leg pain    bilateral  . Chronic pelvic pain in female   . Diabetes mellitus   . Fibromyalgia   . H/O cardiac catheterization    No significant CAD (only 20% LAD) 03/02/13 with normal LV function.  Marland Kitchen Headache   . High cholesterol   . Hypertension   . OAB (overactive bladder) 05/21/2013  . Obstructive sleep apnea    Noncompliant with CPAP due to claustophobia.  . Overactive bladder   . Panic attacks   . Rectocele 05/21/2013  . Seizures (HCC)    Had 1 with negative W/U 08/2014  . Thyroid disease     Patient Active Problem List   Diagnosis Date Noted  . Idiopathic intracranial hypertension 11/19/2015  . Papilledema 09/08/2015  . Left facial numbness 08/22/2015  . Epilepsy, generalized, convulsive (HCC) 06/05/2015  . Obstructive sleep apnea 06/05/2015  . Headache, occipital 06/05/2015  . Neck pain 06/05/2015  . Occipital neuralgia of right side 06/05/2015  . Seizure (HCC) 07/21/2014  . Rectocele 05/21/2013  . OAB (overactive bladder) 05/21/2013  . Difficulty in walking(719.7) 04/11/2013  . Leg weakness, bilateral 04/11/2013  . Chest pain, unspecified 02/28/2013  . Diabetes (HCC) 02/28/2013  . Morbid obesity (HCC) 02/28/2013    . Other and unspecified hyperlipidemia 02/28/2013  . CHRONIC OSTEOMYELITIS ANKLE AND FOOT 07/22/2009  . Hypertension 07/22/2009    Past Surgical History:  Procedure Laterality Date  . ABDOMINAL HYSTERECTOMY    . ANKLE SURGERY    . BACK SURGERY    . CARDIAC CATHETERIZATION  02/2013   no significant coronary artery disease  . CARPAL TUNNEL RELEASE    . knee replacements     bilateral  . LEFT HEART CATHETERIZATION WITH CORONARY ANGIOGRAM N/A 03/02/2013   Procedure: LEFT HEART CATHETERIZATION WITH CORONARY ANGIOGRAM;  Surgeon: Peter M Swaziland, MD;  Location: [facility] CATH LAB;  Service: Cardiovascular;  Laterality: N/A;  . TONSILLECTOMY    . TUBAL LIGATION      OB History    Gravida Para Term Preterm AB Living   2 2 2     2    SAB TAB Ectopic Multiple Live Births                   Home Medications    Prior to Admission medications   Medication Sig Start Date End Date Taking? Authorizing Provider  albuterol (PROVENTIL HFA;VENTOLIN HFA) 108 (90 Base) MCG/ACT inhaler Inhale 1-2 puffs into the lungs every 6 (six) hours as needed for wheezing or shortness of breath.   Yes Historical Provider, MD  atorvastatin (LIPITOR) 40 MG tablet Take 40 mg by mouth at bedtime.  10/15/14  Yes  Historical Provider, MD  BELSOMRA 15 MG TABS Take 15 mg by mouth at bedtime as needed (for sleep). Takes 1 tab every other night 04/03/16  Yes Historical Provider, MD  ibuprofen (ADVIL,MOTRIN) 200 MG tablet Take 200 mg by mouth daily as needed for headache or mild pain.    Yes Historical Provider, MD  imipramine (TOFRANIL) 50 MG tablet TAKE (1) TABLET BY MOUTH AT BEDTIME. 04/13/16  Yes Adline Potter, NP  levETIRAcetam (KEPPRA) 500 MG tablet Take 1 tablet (500 mg total) by mouth 2 (two) times daily. 05/19/16  Yes Asa Lente, MD  levothyroxine (SYNTHROID, LEVOTHROID) 88 MCG tablet Take 88 mcg by mouth daily before breakfast.   Yes Historical Provider, MD  metFORMIN (GLUCOPHAGE) 500 MG tablet Take 500 mg by mouth 2  (two) times daily with a meal.  03/26/14  Yes Historical Provider, MD  metoprolol (LOPRESSOR) 50 MG tablet Take 50 mg by mouth 2 (two) times daily.     Yes Historical Provider, MD  oxybutynin (DITROPAN) 5 MG tablet Take 1 tablet (5 mg total) by mouth 2 (two) times daily. 04/13/16  Yes Adline Potter, NP  RESTASIS 0.05 % ophthalmic emulsion Place 1 drop into both eyes 2 (two) times daily.  08/14/15  Yes Historical Provider, MD  cyclobenzaprine (FLEXERIL) 5 MG tablet Take 1 tablet (5 mg total) by mouth 3 (three) times daily as needed for muscle spasms. Patient not taking: Reported on 10/04/2016 08/22/16   Burgess Amor, PA-C  oxyCODONE-acetaminophen (PERCOCET/ROXICET) 5-325 MG tablet Take 1 tablet by mouth every 4 (four) hours as needed. Patient not taking: Reported on 10/04/2016 08/22/16   Burgess Amor, PA-C    Family History Family History  Problem Relation Age of Onset  . Heart attack Mother   . Diabetes Mother   . Hypertension Mother   . Sick sinus syndrome Brother     Pacemaker  . Congenital heart disease Brother   . Stroke Brother   . Coronary artery disease Brother   . Cancer Maternal Aunt     breast, liver,lung  . Cancer Cousin     leukemia    Social History Social History  Substance Use Topics  . Smoking status: Former Smoker    Packs/day: 1.00    Years: 2.00    Types: Cigarettes    Quit date: 12/13/1974  . Smokeless tobacco: Never Used  . Alcohol use No     Allergies   Aspirin and Codeine   Review of Systems Review of Systems ROS: Statement: All systems negative except as marked or noted in the HPI; Constitutional: Negative for fever and chills. ; ; Eyes: Negative for eye pain, redness and discharge. ; ; ENMT: Negative for ear pain, hoarseness, nasal congestion, sinus pressure and sore throat. ; ; Cardiovascular: +CP. Negative for palpitations, diaphoresis, dyspnea and peripheral edema. ; ; Respiratory: Negative for cough, wheezing and stridor. ; ; Gastrointestinal:  Negative for nausea, vomiting, diarrhea, abdominal pain, blood in stool, hematemesis, jaundice and rectal bleeding. . ; ; Genitourinary: Negative for dysuria, flank pain and hematuria. ; ; Musculoskeletal: Negative for back pain and neck pain. Negative for swelling and trauma.; ; Skin: Negative for pruritus, rash, abrasions, blisters, bruising and skin lesion.; ; Neuro: Negative for headache, lightheadedness and neck stiffness. Negative for weakness, altered level of consciousness, altered mental status, extremity weakness, paresthesias, involuntary movement, seizure and syncope.       Physical Exam Updated Vital Signs BP 104/73   Pulse (!) 57   Temp  98.1 F (36.7 C) (Oral)   Resp 16   Ht 5\' 7"  (1.702 m)   Wt 271 lb (122.9 kg)   SpO2 97%   BMI 42.44 kg/m   Physical Exam 1410: Physical examination:  Nursing notes reviewed; Vital signs and O2 SAT reviewed;  Constitutional: Well developed, Well nourished, Well hydrated, In no acute distress; Head:  Normocephalic, atraumatic; Eyes: EOMI, PERRL, No scleral icterus; ENMT: Mouth and pharynx normal, Mucous membranes moist; Neck: Supple, Full range of motion, No lymphadenopathy; Cardiovascular: Regular rate and rhythm, No gallop; Respiratory: Breath sounds clear & equal bilaterally, No wheezes.  Speaking full sentences with ease, Normal respiratory effort/excursion; Chest: Nontender, Movement normal; Abdomen: Soft, Nontender, Nondistended, Normal bowel sounds; Genitourinary: No CVA tenderness; Extremities: Pulses normal, No tenderness, No edema, No calf edema or asymmetry.; Neuro: AA&Ox3, Major CN grossly intact.  Speech clear. No gross focal motor or sensory deficits in extremities.; Skin: Color normal, Warm, Dry.   ED Treatments / Results  Labs (all labs ordered are listed, but only abnormal results are displayed)   EKG  EKG Interpretation  Date/Time:  Monday October 04 2016 13:05:46 EDT Ventricular Rate:  62 PR Interval:  178 QRS  Duration: 94 QT Interval:  416 QTC Calculation: 422 R Axis:   21 Text Interpretation:  Normal sinus rhythm Low voltage QRS Baseline wander When compared with ECG of 01/25/2016 No significant change was found Confirmed by [facility]  MD, Nicholos Johns [phone]) on 10/04/2016 2:26:38 PM       Radiology   Procedures Procedures (including critical care time)  Medications Ordered in ED Medications  nitroGLYCERIN (NITROSTAT) SL tablet 0.4 mg (0.4 mg Sublingual Given 10/04/16 1518)  morphine 4 MG/ML injection 4 mg (not administered)  aspirin chewable tablet 324 mg (324 mg Oral Given 10/04/16 1509)     Initial Impression / Assessment and Plan / ED Course  I have reviewed the triage vital signs and the nursing notes.  Pertinent labs & imaging results that were available during my care of the patient were reviewed by me and considered in my medical decision making (see chart for details).  MDM Reviewed: previous chart, nursing note and vitals Reviewed previous: labs and ECG Interpretation: labs, ECG and x-ray   Results for orders placed or performed during the hospital encounter of 10/04/16  Basic metabolic panel  Result Value Ref Range   Sodium 138 135 - 145 mmol/L   Potassium 4.2 3.5 - 5.1 mmol/L   Chloride 105 101 - 111 mmol/L   CO2 28 22 - 32 mmol/L   Glucose, Bld 85 65 - 99 mg/dL   BUN 11 6 - 20 mg/dL   Creatinine, Ser 2.70 0.44 - 1.00 mg/dL   Calcium 9.2 8.9 - 62.3 mg/dL   GFR calc non Af Amer >60 >60 mL/min   GFR calc Af Amer >60 >60 mL/min   Anion gap 5 5 - 15  CBC  Result Value Ref Range   WBC 6.2 4.0 - 10.5 K/uL   RBC 4.64 3.87 - 5.11 MIL/uL   Hemoglobin 13.6 12.0 - 15.0 g/dL   HCT 76.2 83.1 - 51.7 %   MCV 89.0 78.0 - 100.0 fL   MCH 29.3 26.0 - 34.0 pg   MCHC 32.9 30.0 - 36.0 g/dL   RDW 61.6 07.3 - 71.0 %   Platelets 174 150 - 400 K/uL  Troponin I  Result Value Ref Range   Troponin I <0.03 <0.03 ng/mL  D-dimer, quantitative  Result Value Ref Range  D-Dimer, Quant  0.27 0.00 - 0.50 ug/mL-FEU   Dg Chest 2 View Result Date: 10/04/2016 CLINICAL DATA:  RIGHT-sided chest pain today.  Asthma. EXAM: CHEST  2 VIEW COMPARISON:  01/25/2016. FINDINGS: Unremarkable cardiomediastinal silhouette. Peribronchial thickening is stable. No focal infiltrates or edema. No pulmonary mass. No pneumothorax or pleural effusion. Osseous spurring in the thoracic spine. IMPRESSION: Stable chest.  No active disease. Electronically Signed   By: Elsie Stain M.D.   On: 10/04/2016 13:55    1735:  Improved after ASA and ntg. Will observation admit. T/C to [facility] Dr. Ophelia Charter, case discussed, including:  HPI, pertinent PM/SHx, VS/PE, dx testing, ED course and treatment:  Agreeable to admit, requests to write temporary orders, obtain observation tele bed to team APAdmits.     Final Clinical Impressions(s) / ED Diagnoses   Final diagnoses:  None    New Prescriptions New Prescriptions   No medications on file     Samuel Jester, DO 10/05/16 1312

## 2016-10-05 ENCOUNTER — Encounter (HOSPITAL_COMMUNITY): Payer: Self-pay | Admitting: Cardiology

## 2016-10-05 DIAGNOSIS — I10 Essential (primary) hypertension: Secondary | ICD-10-CM

## 2016-10-05 DIAGNOSIS — R0789 Other chest pain: Secondary | ICD-10-CM

## 2016-10-05 DIAGNOSIS — R079 Chest pain, unspecified: Secondary | ICD-10-CM | POA: Diagnosis not present

## 2016-10-05 DIAGNOSIS — E782 Mixed hyperlipidemia: Secondary | ICD-10-CM | POA: Diagnosis not present

## 2016-10-05 LAB — TROPONIN I: Troponin I: <0.03 ng/mL (ref <0.03)

## 2016-10-05 LAB — GLUCOSE, CAPILLARY
GLUCOSE-CAPILLARY: 92 mg/dL (ref 65–99)
GLUCOSE-CAPILLARY: 108 mg/dL — AB (ref 65–99)

## 2016-10-05 MED ORDER — OXYCODONE-ACETAMINOPHEN 5-325 MG PO TABS: 1.0000 | ORAL_TABLET | Freq: Four times a day (QID) | ORAL | Status: DC | PRN

## 2016-10-05 NOTE — Discharge Planning (Signed)
Patient IV and tele removed.  Patient discharge papers given, explained and educated.  Told of suggested FU appts, No scripts needed or given.  RN assessment and VS revealed stability for DC to home.  When ready, Patient will be wheeled to front and family transporting home via car.

## 2016-10-05 NOTE — Consult Note (Signed)
CARDIOLOGY CONSULT NOTE   Patient ID: [name] MRN: [id number] DOB/AGE: 61-Jan-1957 61 y.o.  Admit Date: 10/04/2016 Referring Physician: TRH-Singh Primary Physician: Dwana Melena, MD Consulting Cardiologist: Nona Dell MD Primary Cardiologist: Former patient of Dr. Dietrich Pates Reason for Consultation: Chest Pain  Clinical Summary [name] is a 60 y.o.female previously a patient of Dr. Dietrich Pates with known history of chronic chest pain, cardiac catheterization March 2014 showing only minor atherosclerosis (20% LAD) and normal. Other history includes diabetes, hypertension, hypercholesterolemia, thyroid disease, fibromyalgia, panic attacks, obstructive sleep apnea not wearing CPAP .She was last seen by cardiology in April 2014 has not followed since that time. She has had multiple ER visits for various somatic complaints include headache, and other chronic pain issues.  She states she was in her usual state of health sitting on the couch watching television when she had sudden substernal sharp chest pain radiating to the right. She described it as severe. She has family members to bring her to the emergency room but they were unable to do so, and therefore called 911. She states the pain is unlike any previous pain that she has had in the past. Patient also states that she continues to be under a great deal of stress especially this week with an 14 year old teenage granddaughter who is causing a lot of problems in the family.  On arrival to the emergency room blood pressure 124/62, heart rate 54, O2 sat 99% she was afebrile. Chemistries were normal. CBC was normal. Chest x-ray was normal. He EKG revealed normal sinus rhythm on the heart rate of 62 bpm. Troponin has been negative 2.  She has been pain-free since admission. Is hungry, has been kept NPO.   Allergies  Allergen Reactions  . Aspirin Nausea And Vomiting  . Codeine Rash    Medications Scheduled Medications: .  atorvastatin  40 mg Oral QHS  . cycloSPORINE  1 drop Both Eyes BID  . enoxaparin (LOVENOX) injection  60 mg Subcutaneous Q24H  . imipramine  50 mg Oral QHS  . insulin aspart  0-15 Units Subcutaneous TID WC  . levETIRAcetam  500 mg Oral BID  . levothyroxine  100 mcg Oral QAC breakfast  . metoprolol  50 mg Oral BID  . oxybutynin  5 mg Oral BID      PRN Medications: gi cocktail, morphine injection, ondansetron (ZOFRAN) IV, oxyCODONE-acetaminophen, zolpidem   Past Medical History:  Diagnosis Date  . Asthma   . Chronic back pain   . Chronic leg pain    Bilateral  . Chronic pelvic pain in female   . Essential hypertension   . Fibromyalgia   . H/O cardiac catheterization    No significant CAD (only 20% LAD) 03/02/13 with normal LV function.  Marland Kitchen Headache   . High cholesterol   . Hypothyroidism   . OAB (overactive bladder) 05/21/2013  . Obstructive sleep apnea    Noncompliant with CPAP due to claustophobia.  . Panic attacks   . Rectocele 05/21/2013  . Seizures (HCC)    Had 1 with negative W/U 08/2014  . Type 2 diabetes mellitus (HCC)     Past Surgical History:  Procedure Laterality Date  . ABDOMINAL HYSTERECTOMY    . ANKLE SURGERY    . BACK SURGERY    . CARPAL TUNNEL RELEASE    . knee replacements     bilateral  . LEFT HEART CATHETERIZATION WITH CORONARY ANGIOGRAM N/A 03/02/2013   Procedure: LEFT HEART CATHETERIZATION WITH CORONARY ANGIOGRAM;  Surgeon: Peter M Swaziland, MD;  Location: [facility] CATH LAB;  Service: Cardiovascular;  Laterality: N/A;  . TONSILLECTOMY    . TUBAL LIGATION      Family History  Problem Relation Age of Onset  . Heart attack Mother   . Diabetes Mother   . Hypertension Mother   . Sick sinus syndrome Brother     Pacemaker  . Congenital heart disease Brother   . Stroke Brother   . Coronary artery disease Brother   . Cancer Maternal Aunt     breast, liver,lung  . Cancer Cousin     leukemia   Social History [name] reports that she quit smoking about  41 years ago. Her smoking use included Cigarettes. She has a 2.00 pack-year smoking history. She has never used smokeless tobacco. [name] reports that she does not drink alcohol.  Review of Systems Complete review of systems are found to be negative unless outlined in H&P above.  Physical Examination Blood pressure 128/62, pulse (!) 54, temperature 97.8 F (36.6 C), temperature source Oral, resp. rate 18, height 5\' 7"  (1.702 m), weight 275 lb 1.6 oz (124.8 kg), SpO2 97 %.  Intake/Output Summary (Last 24 hours) at 10/05/16 1122 Last data filed at 10/04/16 2300  Gross per 24 hour  Intake              240 ml  Output                0 ml  Net              240 ml    Telemetry: NSR  GEN: Obese woman, no distress. HEENT: Conjunctiva and lids normal, oropharynx clear. Neck: Supple, no elevated JVP or carotid bruits, no thyromegaly. Lungs: Clear to auscultation, nonlabored breathing at rest. Cardiac: Regular rate and rhythm, no S3 or significant systolic murmur, no pericardial rub. Abdomen: Soft, nontender, bowel sounds present. Extremities: No pitting edema, distal pulses 2+. Skin: Warm and dry. Musculoskeletal: No kyphosis. Neuropsychiatric: Alert and oriented x3, affect grossly appropriate.  Prior Cardiac Testing/Procedures 04/27/2011  CORONARY ANGIOGRAPHY:  The left main coronary artery is a moderate-caliber  normal vessel. The left anterior descending coronary artery is a moderate-caliber vessel  with a single branching diagonal and is free of disease.   The circumflex coronary artery is a large vessel which has multiple  branches.  It is free of disease.   The right coronary artery is a moderate-caliber dominant vessel which has a  single posterior descending coronary artery and is free of disease.   Left ventriculogram reveals normal LV systolic function at 60% with no wall  motion abnormalities and no mitral regurgitation.   ASSESSMENT:  1.  False positive  myocardial perfusion imaging study.  2.  Normal coronary arteries.  3.  Normal left ventricular systolic function.  4.  Systemic hypertension.   PLAN:  Aggressive risk factor modification.    Lab Results  Basic Metabolic Panel:  Recent Labs Lab 10/04/16 1447  NA 138  K 4.2  CL 105  CO2 28  GLUCOSE 85  BUN 11  CREATININE 0.83  CALCIUM 9.2    CBC:  Recent Labs Lab 10/04/16 1447  WBC 6.2  HGB 13.6  HCT 41.3  MCV 89.0  PLT 174    Cardiac Enzymes:  Recent Labs Lab 10/04/16 1447 10/04/16 2018 10/05/16 0200 10/05/16 0750  TROPONINI <0.03 <0.03 <0.03 <0.03    Radiology: Dg Chest 2 View  Result Date: 10/04/2016 CLINICAL DATA:  RIGHT-sided chest pain today.  Asthma. EXAM: CHEST  2 VIEW COMPARISON:  01/25/2016. FINDINGS: Unremarkable cardiomediastinal silhouette. Peribronchial thickening is stable. No focal infiltrates or edema. No pulmonary mass. No pneumothorax or pleural effusion. Osseous spurring in the thoracic spine. IMPRESSION: Stable chest.  No active disease. Electronically Signed   By: Elsie Stain M.D.   On: 10/04/2016 13:55    ECG: Sinus bradycardia.  Impression and Recommendations  1. Chest Pain: Atypical features, described as sharp substernal radiating to the right at rest. Troponin I  and ECG are normal. She's had no further chest pain since arriving to the hospital. She will need outpatient follow-up for review, but do not anticipate further ischemic testing at this time. We'll discuss need to proceed with outpatient stress test if symptoms persist.  2. Hypertension: Blood pressure is currently controlled. No changes in medication regimen at this time.  3. Thyroid disease: Recent adjustment of thyroid medication by PCP. To follow with them outpatient.  4. Obstructive sleep apnea: Recommend use of CPAP as she is not consistent with this.   Signed: Bettey Mare. Lawrence NP AACC  10/05/2016, 11:22 AM Co-Sign MD  Attending note:  Patient  seen and examined. Reviewed records and updated her chart, also modified above note by Ms. Lawrence NP. [name] presents with fairly atypical, sharp and prolonged chest pain at rest. Under hospital observation she has had no symptom recurrence, troponin I levels have been normal, and ECG shows no acute ST segment changes. History includes cardiac catheterization from 2014 which demonstrated only mild atherosclerosis, specifically 20% LAD stenosis.  On examination she appears comfortable, systolic blood pressure in the 120s, heart rate in the 50s and sinus rhythm. Lungs are clear without labored breathing, cardiac exam reveals RRR without murmur or gallop. Troponin I level negative 4. ECG shows sinus bradycardia. Chest x-ray without acute findings.  Atypical chest pain as outlined. At this point we do not anticipate further ischemic testing. Stable for discharge home, we will arrange an office visit to review her symptoms in a few weeks and determine if any further cardiac workup is needed.  Jonelle Sidle, M.D., F.A.C.C.

## 2016-10-05 NOTE — Discharge Summary (Signed)
[name] DJT:[id number] DOB: 06-Nov-1956 DOA: 10/04/2016  PCP: Chloe Melena, MD  Admit date: 10/04/2016  Discharge date: 10/05/2016  Admitted From: Home  Disposition:  Home   Recommendations for Outpatient Follow-up:   Follow up with PCP in 1-2 weeks  PCP Please obtain BMP/CBC, 2 view CXR in 1week,  (see Discharge instructions)   PCP Please follow up on the following pending results: None   Home Health: None   Equipment/Devices: None  Consultations: Cards Discharge Condition: Stable   CODE STATUS: Full   Diet Recommendation: Heart Healthy Low Carb   Chief Complaint  Patient presents with  . Chest Pain     Brief history of present illness from the day of admission and additional interim summary    [name] is a 60 y.o. female with medical history significant of OSA (no CPAP), HTN, HLD, DM, fibromylagia, chronic pain presenting with chest pain which started this AM.  Also experienced similar chest pain Saturday and yesterday.  She reports that pains today were different, husband disagrees.  Also with R sided facial numbness.  Substernal, non-exertional, constant until given NTG in ER.  +SOB.  No diaphoresis.  No nausea.   Hospital issues addressed     Chest pain - Ruled out for MI, CXR-EKG stable, was seen by Cards and cleared for Home DC,   DM -continue home Rx  HTN -Continue Home Rx, BP stable.  HLD -Continue Lipitor  Hypothyroidism -  Continue Home dose Synthroid PCP to monitor TSH.   Discharge diagnosis     Principal Problem:   Chest pain Active Problems:   Hypertension   Diabetes (HCC)   Hyperlipidemia   Hypothyroidism    Discharge instructions    Discharge Instructions    Discharge instructions    Complete by:  As directed    Follow with Primary MD Chloe Melena, MD  in 7 days   Get CBC, CMP, 2 view Chest X ray checked  by Primary MD or SNF MD in 5-7 days ( we routinely change or add medications that can affect your baseline labs and fluid status, therefore we recommend that you get the mentioned basic workup next visit with your PCP, your PCP may decide not to get them or add new tests based on their clinical decision)   Activity: As tolerated with Full fall precautions use walker/cane & assistance as needed   Disposition Home     Diet:   Heart Healthy Low Carb.  For Heart failure patients - Check your Weight same time everyday, if you gain over 2 pounds, or you develop in leg swelling, experience more shortness of breath or chest pain, call your Primary MD immediately. Follow Cardiac Low Salt Diet and 1.5 lit/day fluid restriction.   On your next visit with your primary care physician please Get Medicines reviewed and adjusted.   Please request your Prim.MD to go over all Hospital Tests and Procedure/Radiological results at the follow up, please get all Hospital records sent to your Prim MD by  signing hospital release before you go home.   If you experience worsening of your admission symptoms, develop shortness of breath, life threatening emergency, suicidal or homicidal thoughts you must seek medical attention immediately by calling 911 or calling your MD immediately  if symptoms less severe.  You Must read complete instructions/literature along with all the possible adverse reactions/side effects for all the Medicines you take and that have been prescribed to you. Take any new Medicines after you have completely understood and accpet all the possible adverse reactions/side effects.   Do not drive, operate heavy machinery, perform activities at heights, swimming or participation in water activities or provide baby sitting services if your were admitted for syncope or siezures until you have seen by Primary MD or a Neurologist and advised to do so  again.  Do not drive when taking Pain medications.    Do not take more than prescribed Pain, Sleep and Anxiety Medications  Special Instructions: If you have smoked or chewed Tobacco  in the last 2 yrs please stop smoking, stop any regular Alcohol  and or any Recreational drug use.  Wear Seat belts while driving.   Please note  You were cared for by a hospitalist during your hospital stay. If you have any questions about your discharge medications or the care you received while you were in the hospital after you are discharged, you can call the unit and asked to speak with the hospitalist on call if the hospitalist that took care of you is not available. Once you are discharged, your primary care physician will handle any further medical issues. Please note that NO REFILLS for any discharge medications will be authorized once you are discharged, as it is imperative that you return to your primary care physician (or establish a relationship with a primary care physician if you do not have one) for your aftercare needs so that they can reassess your need for medications and monitor your lab values.   Increase activity slowly    Complete by:  As directed       Discharge Medications     Medication List    STOP taking these medications   ibuprofen 200 MG tablet Commonly known as:  ADVIL,MOTRIN     TAKE these medications   albuterol 108 (90 Base) MCG/ACT inhaler Commonly known as:  PROVENTIL HFA;VENTOLIN HFA Inhale 1-2 puffs into the lungs every 6 (six) hours as needed for wheezing or shortness of breath.   atorvastatin 40 MG tablet Commonly known as:  LIPITOR Take 40 mg by mouth at bedtime.   BELSOMRA 15 MG Tabs Generic drug:  Suvorexant Take 15 mg by mouth at bedtime as needed (for sleep). Takes 1 tab every other night   imipramine 50 MG tablet Commonly known as:  TOFRANIL TAKE (1) TABLET BY MOUTH AT BEDTIME.   levETIRAcetam 500 MG tablet Commonly known as:  KEPPRA Take 1  tablet (500 mg total) by mouth 2 (two) times daily.   levothyroxine 88 MCG tablet Commonly known as:  SYNTHROID, LEVOTHROID Take 88 mcg by mouth daily before breakfast.   metFORMIN 500 MG tablet Commonly known as:  GLUCOPHAGE Take 500 mg by mouth 2 (two) times daily with a meal.   metoprolol 50 MG tablet Commonly known as:  LOPRESSOR Take 50 mg by mouth 2 (two) times daily.   oxybutynin 5 MG tablet Commonly known as:  DITROPAN Take 1 tablet (5 mg total) by mouth 2 (two) times daily.   RESTASIS 0.05 %  ophthalmic emulsion Generic drug:  cycloSPORINE Place 1 drop into both eyes 2 (two) times daily.       Follow-up Information    Chloe Melena, MD. Schedule an appointment as soon as possible for a visit in 1 week(s).   Specialty:  Internal Medicine Contact information: [address] [place] [address] [phone]        Joni Reining, NP Follow up on 10/14/2016.   Specialties:  Nurse Practitioner, Radiology, Cardiology Why:  3:10 pm Contact information: [address] [place] [address] [phone]           Major procedures and Radiology Reports - PLEASE review detailed and final reports thoroughly  -         Dg Chest 2 View  Result Date: 10/04/2016 CLINICAL DATA:  RIGHT-sided chest pain today.  Asthma. EXAM: CHEST  2 VIEW COMPARISON:  01/25/2016. FINDINGS: Unremarkable cardiomediastinal silhouette. Peribronchial thickening is stable. No focal infiltrates or edema. No pulmonary mass. No pneumothorax or pleural effusion. Osseous spurring in the thoracic spine. IMPRESSION: Stable chest.  No active disease. Electronically Signed   By: Elsie Stain M.D.   On: 10/04/2016 13:55    Micro Results     No results found for this or any previous visit (from the past 240 hour(s)).  Today   Subjective    [name] today has no headache,no chest abdominal pain,no new weakness tingling or numbness, feels much better wants to go home today.       Objective   Blood pressure 128/62, pulse (!) 54, temperature 97.8 F (36.6 C), temperature source Oral, resp. rate 18, height 5\' 7"  (1.702 m), weight 124.8 kg (275 lb 1.6 oz), SpO2 97 %.   Intake/Output Summary (Last 24 hours) at 10/05/16 1113 Last data filed at 10/04/16 2300  Gross per 24 hour  Intake              240 ml  Output                0 ml  Net              240 ml    Exam Awake Alert, Oriented x 3, No new F.N deficits, Normal affect [place].AT,PERRAL Supple Neck,No JVD, No cervical lymphadenopathy appriciated.  Symmetrical Chest wall movement, Good air movement bilaterally, CTAB RRR,No Gallops,Rubs or new Murmurs, No Parasternal Heave +ve B.Sounds, Abd Soft, Non tender, No organomegaly appriciated, No rebound -guarding or rigidity. No Cyanosis, Clubbing or edema, No new Rash or bruise   Data Review   CBC w Diff: Lab Results  Component Value Date   WBC 6.2 10/04/2016   HGB 13.6 10/04/2016   HCT 41.3 10/04/2016   HCT 38.6 08/22/2015   PLT 174 10/04/2016   LYMPHOPCT 38 08/22/2016   MONOPCT 11 08/22/2016   EOSPCT 2 08/22/2016   BASOPCT 2 08/22/2016    CMP: Lab Results  Component Value Date   NA 138 10/04/2016   K 4.2 10/04/2016   CL 105 10/04/2016   CO2 28 10/04/2016   BUN 11 10/04/2016   CREATININE 0.83 10/04/2016   PROT 6.8 08/23/2015   ALBUMIN 3.8 08/23/2015   BILITOT 0.7 08/23/2015   ALKPHOS 65 08/23/2015   AST 48 (H) 08/23/2015   ALT 38 08/23/2015  . Lab Results  Component Value Date   TSH 1.716 08/22/2015     Total Time in preparing paper work, data evaluation and todays exam - 35 minutes  Trevar Boehringer K M.D  on 10/05/2016 at 11:13 AM  [facility]   Office  [phone]

## 2016-10-05 NOTE — Plan of Care (Signed)
Per third shift report, Patient lost IV last night and is a diff stick.  Dr. Eather Colas and stated at this time, patient does not need.  However, if patient needs for stress test - RN will ask for them to assist with placement.

## 2016-10-05 NOTE — Care Management Note (Signed)
Case Management Note  Patient Details  Name: [name] MRN: [id number] Date of Birth: 02-16-1956     Expected Discharge Date:    10/05/2016              Expected Discharge Plan:  Home/Self Care  In-House Referral:  NA  Discharge planning Services  CM Consult  Post Acute Care Choice:  NA Choice offered to:  NA  DME Arranged:    DME Agency:     HH Arranged:    HH Agency:     Status of Service:  Completed, signed off  If discussed at [employer] of Stay Meetings, dates discussed:    Additional Comments: Patient discharging today, Chart reviewed for CM needs. Patient is ind with ADL's, has a PCP and insurance with prescription drug coverage. She has CPAP PTA. No CM needs identified.   Audery Wassenaar, Chrystine Oiler, RN 10/05/2016, 11:28 AM

## 2016-10-05 NOTE — Discharge Instructions (Signed)
Follow with Primary MD Chloe Melena, MD in 7 days   Get CBC, CMP, 2 view Chest X ray checked  by Primary MD or SNF MD in 5-7 days ( we routinely change or add medications that can affect your baseline labs and fluid status, therefore we recommend that you get the mentioned basic workup next visit with your PCP, your PCP may decide not to get them or add new tests based on their clinical decision)   Activity: As tolerated with Full fall precautions use walker/cane & assistance as needed   Disposition Home     Diet:   Heart Healthy Low Carb.  For Heart failure patients - Check your Weight same time everyday, if you gain over 2 pounds, or you develop in leg swelling, experience more shortness of breath or chest pain, call your Primary MD immediately. Follow Cardiac Low Salt Diet and 1.5 lit/day fluid restriction.   On your next visit with your primary care physician please Get Medicines reviewed and adjusted.   Please request your Prim.MD to go over all Hospital Tests and Procedure/Radiological results at the follow up, please get all Hospital records sent to your Prim MD by signing hospital release before you go home.   If you experience worsening of your admission symptoms, develop shortness of breath, life threatening emergency, suicidal or homicidal thoughts you must seek medical attention immediately by calling 911 or calling your MD immediately  if symptoms less severe.  You Must read complete instructions/literature along with all the possible adverse reactions/side effects for all the Medicines you take and that have been prescribed to you. Take any new Medicines after you have completely understood and accpet all the possible adverse reactions/side effects.   Do not drive, operate heavy machinery, perform activities at heights, swimming or participation in water activities or provide baby sitting services if your were admitted for syncope or siezures until you have seen by Primary MD or  a Neurologist and advised to do so again.  Do not drive when taking Pain medications.    Do not take more than prescribed Pain, Sleep and Anxiety Medications  Special Instructions: If you have smoked or chewed Tobacco  in the last 2 yrs please stop smoking, stop any regular Alcohol  and or any Recreational drug use.  Wear Seat belts while driving.   Please note  You were cared for by a hospitalist during your hospital stay. If you have any questions about your discharge medications or the care you received while you were in the hospital after you are discharged, you can call the unit and asked to speak with the hospitalist on call if the hospitalist that took care of you is not available. Once you are discharged, your primary care physician will handle any further medical issues. Please note that NO REFILLS for any discharge medications will be authorized once you are discharged, as it is imperative that you return to your primary care physician (or establish a relationship with a primary care physician if you do not have one) for your aftercare needs so that they can reassess your need for medications and monitor your lab values.

## 2016-10-14 ENCOUNTER — Encounter: Payer: Self-pay | Admitting: Physician Assistant

## 2016-10-14 ENCOUNTER — Ambulatory Visit (INDEPENDENT_AMBULATORY_CARE_PROVIDER_SITE_OTHER): Payer: Medicare Other | Admitting: Physician Assistant

## 2016-10-14 VITALS — BP 138/88 | HR 74 | Ht 67.0 in | Wt 268.0 lb

## 2016-10-14 DIAGNOSIS — R079 Chest pain, unspecified: Secondary | ICD-10-CM

## 2016-10-14 DIAGNOSIS — I10 Essential (primary) hypertension: Secondary | ICD-10-CM | POA: Diagnosis not present

## 2016-10-14 DIAGNOSIS — T148XXA Other injury of unspecified body region, initial encounter: Secondary | ICD-10-CM | POA: Insufficient documentation

## 2016-10-14 NOTE — Progress Notes (Signed)
Cardiology Office Note    Date:  10/14/2016   ID:  [name], DOB 11-26-1956, MRN [id number]  PCP:  Dwana Melena, MD  Cardiologist: Dr. Diona Browner  No chief complaint on file.   History of Present Illness:  [name] is a 60 y.o. female with known history of chronic chest pain, cardiac catheterization March 2014 showing only minor atherosclerosis (20% LAD) otherwise normal. She also has history of diabetes, hypertension, hypercholesterolemia, thyroid disease, fibromyalgia, panic attacks, obstructive sleep apnea not wearing CPAP.  Patient came into the hospital 10/04/16 with atypical chest pain, normal troponins and EKG. No further ischemic workup recommended.  Patient comes in for post hospital follow-up. She has had no further chest pain. She is complaining of pain in the back of her neck. She had a dizzy spell the other day when she was talking to her cousin on the phone. She took her blood pressure and it was 118/70. Her blood sugar was stable pulse and oxygen were all normal. Last about 30 minutes and then resolved. She hasn't had any further dizziness. No positional dizziness. She was sitting down what happened. She also complains of bruising where she had a Lovenox shot in the hospital on her abdomen. It seems to be getting bigger and darker.     Past Medical History:  Diagnosis Date  . Asthma   . Chronic back pain   . Chronic leg pain    Bilateral  . Chronic pelvic pain in female   . Essential hypertension   . Fibromyalgia   . H/O cardiac catheterization    No significant CAD (only 20% LAD) 03/02/13 with normal LV function.  Marland Kitchen Headache   . High cholesterol   . Hypothyroidism   . OAB (overactive bladder) 05/21/2013  . Obstructive sleep apnea    Noncompliant with CPAP due to claustophobia.  . Panic attacks   . Rectocele 05/21/2013  . Seizures (HCC)    Had 1 with negative W/U 08/2014  . Type 2 diabetes mellitus (HCC)     Past Surgical History:  Procedure  Laterality Date  . ABDOMINAL HYSTERECTOMY    . ANKLE SURGERY    . BACK SURGERY    . CARPAL TUNNEL RELEASE    . knee replacements     bilateral  . LEFT HEART CATHETERIZATION WITH CORONARY ANGIOGRAM N/A 03/02/2013   Procedure: LEFT HEART CATHETERIZATION WITH CORONARY ANGIOGRAM;  Surgeon: Peter M Swaziland, MD;  Location: [facility] CATH LAB;  Service: Cardiovascular;  Laterality: N/A;  . TONSILLECTOMY    . TUBAL LIGATION      Current Medications: Outpatient Medications Prior to Visit  Medication Sig Dispense Refill  . albuterol (PROVENTIL HFA;VENTOLIN HFA) 108 (90 Base) MCG/ACT inhaler Inhale 1-2 puffs into the lungs every 6 (six) hours as needed for wheezing or shortness of breath.    Marland Kitchen atorvastatin (LIPITOR) 40 MG tablet Take 40 mg by mouth at bedtime.     . BELSOMRA 15 MG TABS Take 15 mg by mouth at bedtime as needed (for sleep). Takes 1 tab every other night    . imipramine (TOFRANIL) 50 MG tablet TAKE (1) TABLET BY MOUTH AT BEDTIME. 30 tablet 12  . levETIRAcetam (KEPPRA) 500 MG tablet Take 1 tablet (500 mg total) by mouth 2 (two) times daily. 60 tablet 11  . levothyroxine (SYNTHROID, LEVOTHROID) 88 MCG tablet Take 88 mcg by mouth daily before breakfast.    . metFORMIN (GLUCOPHAGE) 500 MG tablet Take 500 mg by mouth 2 (two)  times daily with a meal.     . metoprolol (LOPRESSOR) 50 MG tablet Take 50 mg by mouth 2 (two) times daily.      Marland Kitchen oxybutynin (DITROPAN) 5 MG tablet Take 1 tablet (5 mg total) by mouth 2 (two) times daily. 60 tablet 12  . RESTASIS 0.05 % ophthalmic emulsion Place 1 drop into both eyes 2 (two) times daily.      No facility-administered medications prior to visit.      Allergies:   Aspirin and Codeine   Social History   Social History  . Marital status: Married    Spouse name: N/A  . Number of children: N/A  . Years of education: N/A   Occupational History  . disabled    Social History Main Topics  . Smoking status: Former Smoker    Packs/day: 1.00    Years: 2.00     Types: Cigarettes    Quit date: 12/13/1974  . Smokeless tobacco: Never Used  . Alcohol use No  . Drug use: No  . Sexual activity: Not Currently    Birth control/ protection: Surgical     Comment: hyst   Other Topics Concern  . None   Social History Narrative  . None     Family History:  The patient's family history includes Cancer in her cousin and maternal aunt; Congenital heart disease in her brother; Coronary artery disease in her brother; Diabetes in her mother; Heart attack in her mother; Hypertension in her mother; Sick sinus syndrome in her brother; Stroke in her brother.   ROS:   Please see the history of present illness.    Review of Systems  Constitution: Negative.  HENT: Positive for headaches.   Eyes: Negative.   Cardiovascular: Negative.   Respiratory: Negative.   Hematologic/Lymphatic: Negative.   Musculoskeletal: Positive for back pain and myalgias. Negative for joint pain.  Gastrointestinal: Negative.   Genitourinary: Negative.   Neurological: Positive for dizziness.   All other systems reviewed and are negative.   PHYSICAL EXAM:   VS:  BP 138/88   Pulse 74   Ht 5\' 7"  (1.702 m)   Wt 268 lb (121.6 kg)   SpO2 95%   BMI 41.97 kg/m   Physical Exam  GEN: Obese in no acute distress  Neck: no JVD, carotid bruits, or masses Cardiac:RRR; 2/6 systolic murmur at the left sternal border, no rubs, or gallops  Respiratory:  clear to auscultation bilaterally, normal work of breathing GI: Abdomen has bruising where the Lovenox shot was given. It is soft and nontender, no hematoma.  nondistended, + BS Ext: without cyanosis, clubbing, or edema, Good distal pulses bilaterally MS: no deformity or atrophy  Skin: warm and dry, no rash Psych: euthymic mood, full affect  Wt Readings from Last 3 Encounters:  10/14/16 268 lb (121.6 kg)  10/04/16 275 lb 1.6 oz (124.8 kg)  08/01/16 283 lb (128.4 kg)      Studies/Labs Reviewed:   EKG:  EKG is Not ordered today.     Recent Labs: 10/04/2016: BUN 11; Creatinine, Ser 0.83; Hemoglobin 13.6; Platelets 174; Potassium 4.2; Sodium 138   Lipid Panel    Component Value Date/Time   CHOL 193 07/22/2014 0607   TRIG 226 (H) 07/22/2014 0607   HDL 37 (L) 07/22/2014 0607   CHOLHDL 5.2 07/22/2014 0607   VLDL 45 (H) 07/22/2014 0607   LDLCALC 111 (H) 07/22/2014 0607    Additional studies/ records that were reviewed today include:  Cardiac catheterization 03/02/13  Procedural Findings: Hemodynamics:   AO 150/87 mean 114 mmHg LV 150/13 mm Hg              Coronary angiography: Coronary dominance: codominant   Left mainstem: Normal   Left anterior descending (LAD): 20% ostial, otherwise normal.   Left circumflex (LCx): Normal   Right coronary artery (RCA): Normal.   Left ventriculography: Left ventricular systolic function is normal, LVEF is estimated at 55-65%, there is no significant mitral regurgitation    Final Conclusions:   1. No significant coronary artery disease 2. Normal LV function.   Recommendations: medical Rx for noncardiac chest pain with PPI.   Theron Arista [facility] 03/02/2013, 9:25 AM       ASSESSMENT:    1. Chest pain, unspecified type   2. Essential hypertension   3. Bruising      PLAN:  In order of problems listed above: Chest pain atypical has resolved no recurrence, follow-up with Dr. Diona Browner in 6 months.  Essential hypertension controlled continue metoprolol  Bruising of her abdomen at Lovenox injection site without evidence of hematoma. I asked her to draw circle around the area to make sure it is decreasing in size. She sees Dr. [name] on Monday who can reevaluate. Discussed trying a CBC in light of her dizziness the other day but she is having blood work on Monday so she will wait.     Medication Adjustments/Labs and Tests Ordered: Current medicines are reviewed at length with the patient today.  Concerns regarding medicines are outlined above.  Medication  changes, Labs and Tests ordered today are listed in the Patient Instructions below. Patient Instructions  Your physician wants you to follow-up in: 6 Months with Dr. Diona Browner. You will receive a reminder letter in the mail two months in advance. If you don't receive a letter, please call our office to schedule the follow-up appointment.  Your physician recommends that you continue on your current medications as directed. Please refer to the Current Medication list given to you today.  If you need a refill on your cardiac medications before your next appointment, please call your pharmacy.  Thank you for choosing [facility]!      Elson Clan, PA-C  10/14/2016 3:33 PM    [facility] [address] [place], [place], [place]  [address] Phone: [phone]; Fax: [phone]

## 2016-10-14 NOTE — Patient Instructions (Signed)
Your physician wants you to follow-up in: 6 Months with Dr. McDowell.  You will receive a reminder letter in the mail two months in advance. If you don't receive a letter, please call our office to schedule the follow-up appointment.  Your physician recommends that you continue on your current medications as directed. Please refer to the Current Medication list given to you today.  If you need a refill on your cardiac medications before your next appointment, please call your pharmacy.  Thank you for choosing [facility]! '  

## 2016-10-18 DIAGNOSIS — F5101 Primary insomnia: Secondary | ICD-10-CM | POA: Diagnosis not present

## 2016-10-18 DIAGNOSIS — R5383 Other fatigue: Secondary | ICD-10-CM | POA: Diagnosis not present

## 2016-10-18 DIAGNOSIS — G4733 Obstructive sleep apnea (adult) (pediatric): Secondary | ICD-10-CM | POA: Diagnosis not present

## 2016-11-08 DIAGNOSIS — E039 Hypothyroidism, unspecified: Secondary | ICD-10-CM | POA: Diagnosis not present

## 2016-11-08 DIAGNOSIS — I10 Essential (primary) hypertension: Secondary | ICD-10-CM | POA: Diagnosis not present

## 2016-11-08 DIAGNOSIS — E119 Type 2 diabetes mellitus without complications: Secondary | ICD-10-CM | POA: Diagnosis not present

## 2016-11-15 DIAGNOSIS — E039 Hypothyroidism, unspecified: Secondary | ICD-10-CM | POA: Diagnosis not present

## 2016-12-09 DIAGNOSIS — Z0000 Encounter for general adult medical examination without abnormal findings: Secondary | ICD-10-CM | POA: Diagnosis not present

## 2017-01-11 DIAGNOSIS — R05 Cough: Secondary | ICD-10-CM | POA: Diagnosis not present

## 2017-01-11 DIAGNOSIS — R51 Headache: Secondary | ICD-10-CM | POA: Diagnosis not present

## 2017-01-11 DIAGNOSIS — R112 Nausea with vomiting, unspecified: Secondary | ICD-10-CM | POA: Diagnosis not present

## 2017-01-11 DIAGNOSIS — J060 Acute laryngopharyngitis: Secondary | ICD-10-CM | POA: Diagnosis not present

## 2017-01-11 DIAGNOSIS — R197 Diarrhea, unspecified: Secondary | ICD-10-CM | POA: Diagnosis not present

## 2017-01-28 ENCOUNTER — Emergency Department (HOSPITAL_COMMUNITY)
Admission: EM | Admit: 2017-01-28 | Discharge: 2017-01-29 | Disposition: A | Discharge status: Home / Self Care | Payer: Medicare Other | Attending: Emergency Medicine | Admitting: Emergency Medicine

## 2017-01-28 ENCOUNTER — Emergency Department (HOSPITAL_COMMUNITY): Payer: Medicare Other

## 2017-01-28 ENCOUNTER — Encounter (HOSPITAL_COMMUNITY): Payer: Self-pay

## 2017-01-28 DIAGNOSIS — Z87891 Personal history of nicotine dependence: Secondary | ICD-10-CM | POA: Diagnosis not present

## 2017-01-28 DIAGNOSIS — I10 Essential (primary) hypertension: Secondary | ICD-10-CM | POA: Diagnosis not present

## 2017-01-28 DIAGNOSIS — E039 Hypothyroidism, unspecified: Secondary | ICD-10-CM | POA: Insufficient documentation

## 2017-01-28 DIAGNOSIS — R079 Chest pain, unspecified: Secondary | ICD-10-CM

## 2017-01-28 DIAGNOSIS — Z7984 Long term (current) use of oral hypoglycemic drugs: Secondary | ICD-10-CM | POA: Diagnosis not present

## 2017-01-28 DIAGNOSIS — J45909 Unspecified asthma, uncomplicated: Secondary | ICD-10-CM | POA: Diagnosis not present

## 2017-01-28 DIAGNOSIS — R0789 Other chest pain: Secondary | ICD-10-CM | POA: Diagnosis not present

## 2017-01-28 DIAGNOSIS — R0602 Shortness of breath: Secondary | ICD-10-CM | POA: Insufficient documentation

## 2017-01-28 DIAGNOSIS — E119 Type 2 diabetes mellitus without complications: Secondary | ICD-10-CM | POA: Diagnosis not present

## 2017-01-28 DIAGNOSIS — Z79899 Other long term (current) drug therapy: Secondary | ICD-10-CM | POA: Insufficient documentation

## 2017-01-28 LAB — BASIC METABOLIC PANEL
Anion gap: 10 (ref 5–15)
BUN: 19 mg/dL (ref 6–20)
CO2: 22 mmol/L (ref 22–32)
CREATININE: 1.06 mg/dL — AB (ref 0.44–1.00)
Calcium: 10 mg/dL (ref 8.9–10.3)
Chloride: 108 mmol/L (ref 101–111)
GFR calc non Af Amer: 55 mL/min — ABNORMAL LOW (ref >60)
Glucose, Bld: 148 mg/dL — ABNORMAL HIGH (ref 65–99)
Potassium: 3.7 mmol/L (ref 3.5–5.1)
Sodium: 140 mmol/L (ref 135–145)

## 2017-01-28 LAB — CBC
HCT: 42.7 % (ref 36.0–46.0)
HEMOGLOBIN: 14.4 g/dL (ref 12.0–15.0)
MCH: 29.9 pg (ref 26.0–34.0)
MCHC: 33.7 g/dL (ref 30.0–36.0)
MCV: 88.8 fL (ref 78.0–100.0)
PLATELETS: 242 10*3/uL (ref 150–400)
RBC: 4.81 MIL/uL (ref 3.87–5.11)
RDW: 12.6 % (ref 11.5–15.5)
WBC: 8.5 10*3/uL (ref 4.0–10.5)

## 2017-01-28 LAB — I-STAT TROPONIN, ED: Troponin i, poc: 0 ng/mL (ref 0.00–0.08)

## 2017-01-28 MED ORDER — LORAZEPAM 1 MG PO TABS
1.0000 mg | ORAL_TABLET | Freq: Once | ORAL | Status: AC
  Administered 2017-01-28: 1 mg via ORAL
  Filled 2017-01-28: qty 1

## 2017-01-28 NOTE — ED Triage Notes (Signed)
Pt states she had to walk to the e.r. With left chest pain, pain is worse with deep breathing and some movement.

## 2017-01-28 NOTE — ED Notes (Signed)
Pt resting calmly w/ eyes closed. Rise & fall of the chest noted. Bed in low position, side rails up x2. NAD noted at this time.  

## 2017-01-28 NOTE — ED Provider Notes (Signed)
[facility]-EMERGENCY DEPT Provider Note   CSN: [id number] Arrival date & time: 01/28/17  2051  By signing my name below, I, Talbert Nan, attest that this documentation has been prepared under the direction and in the presence of Maia Plan, MD. Electronically Signed: Talbert Nan, Scribe. 01/28/17. 9:29 PM.   History   Chief Complaint Chief Complaint  Patient presents with  . Chest Pain    HPI [name] is a 61 y.o. female with h/o mini heart attack, but no h/o stents, HTN, hypothyroidism, who presents to the Emergency Department complaining of acute onset, moderate-severe, constant chest pain described as sharp needle like pain that started 30 minutes PTA. Pain radiates down both arms and does not feel like any pain that she has had before. Pt has associated SOB, stress. Pt started to walk to [place], and almost fell on the way here and was picked up by a friend. Pt is allergic to asiprin, codeine. Pt takes ibuprofen at home for pain as needed. Pt was admitted for CP in October 2017. Pt had left heart catheterization with coronary angiogram in 2014. Pt states that she has been arguing with her husband all day and that he went off, and that her family did not care about her and so she walked to the hospital. Mother died from a heart attack. Pt denies back pain.   The history is provided by the patient. No language interpreter was used.    Past Medical History:  Diagnosis Date  . Asthma   . Chronic back pain   . Chronic leg pain    Bilateral  . Chronic pelvic pain in female   . Essential hypertension   . Fibromyalgia   . H/O cardiac catheterization    No significant CAD (only 20% LAD) 03/02/13 with normal LV function.  Marland Kitchen Headache   . High cholesterol   . Hypothyroidism   . OAB (overactive bladder) 05/21/2013  . Obstructive sleep apnea    Noncompliant with CPAP due to claustophobia.  . Panic attacks   . Rectocele 05/21/2013  . Seizures (HCC)    Had 1 with negative W/U 08/2014  . Type 2  diabetes mellitus [facility])     Patient Active Problem List   Diagnosis Date Noted  . Bruising 10/14/2016  . Chest pain 10/04/2016  . Hypothyroidism 10/04/2016  . Idiopathic intracranial hypertension 11/19/2015  . Papilledema 09/08/2015  . Left facial numbness 08/22/2015  . Epilepsy, generalized, convulsive (HCC) 06/05/2015  . Obstructive sleep apnea 06/05/2015  . Headache, occipital 06/05/2015  . Neck pain 06/05/2015  . Occipital neuralgia of right side 06/05/2015  . Seizure (HCC) 07/21/2014  . Rectocele 05/21/2013  . OAB (overactive bladder) 05/21/2013  . Difficulty in walking(719.7) 04/11/2013  . Leg weakness, bilateral 04/11/2013  . Diabetes (HCC) 02/28/2013  . Morbid obesity (HCC) 02/28/2013  . Hyperlipidemia 02/28/2013  . CHRONIC OSTEOMYELITIS ANKLE AND FOOT 07/22/2009  . Hypertension 07/22/2009    Past Surgical History:  Procedure Laterality Date  . ABDOMINAL HYSTERECTOMY    . ANKLE SURGERY    . BACK SURGERY    . CARPAL TUNNEL RELEASE    . knee replacements     bilateral  . LEFT HEART CATHETERIZATION WITH CORONARY ANGIOGRAM N/A 03/02/2013   Procedure: LEFT HEART CATHETERIZATION WITH CORONARY ANGIOGRAM;  Surgeon: Peter M Swaziland, MD;  Location: [facility] C[facility] LAB;  Service: Cardiovascular;  Laterality: N/A;  . TONSILLECTOMY    . TUBAL LIGATION      OB History  Gravida Para Term Preterm AB Living   2 2 2     2    SAB TAB Ectopic Multiple Live Births                   Home Medications    Prior to Admission medications   Medication Sig Start Date End Date Taking? Authorizing Provider  albuterol (PROVENTIL HFA;VENTOLIN HFA) 108 (90 Base) MCG/ACT inhaler Inhale 1-2 puffs into the lungs every 6 (six) hours as needed for wheezing or shortness of breath.   Yes Historical Provider, MD  atorvastatin (LIPITOR) 40 MG tablet Take 40 mg by mouth at bedtime.  10/15/14  Yes Historical Provider, MD  BELSOMRA 15 MG TABS Take 15 mg by mouth at bedtime as needed (for sleep). Takes 1 tab  every other night 04/03/16  Yes Historical Provider, MD  imipramine (TOFRANIL) 50 MG tablet TAKE (1) TABLET BY MOUTH AT BEDTIME. 04/13/16  Yes Adline Potter, NP  levETIRAcetam (KEPPRA) 500 MG tablet Take 1 tablet (500 mg total) by mouth 2 (two) times daily. 05/19/16  Yes Asa Lente, MD  levothyroxine (SYNTHROID, LEVOTHROID) 100 MCG tablet Take 100 mcg by mouth daily. 12/31/16  Yes Historical Provider, MD  metFORMIN (GLUCOPHAGE) 500 MG tablet Take 500 mg by mouth 2 (two) times daily with a meal.  03/26/14  Yes Historical Provider, MD  metoprolol (LOPRESSOR) 50 MG tablet Take 50 mg by mouth 2 (two) times daily.     Yes Historical Provider, MD  oxybutynin (DITROPAN) 5 MG tablet Take 1 tablet (5 mg total) by mouth 2 (two) times daily. 04/13/16  Yes Adline Potter, NP  RESTASIS 0.05 % ophthalmic emulsion Place 1 drop into both eyes 2 (two) times daily.  08/14/15  Yes Historical Provider, MD    Family History Family History  Problem Relation Age of Onset  . Heart attack Mother   . Diabetes Mother   . Hypertension Mother   . Sick sinus syndrome Brother     Pacemaker  . Congenital heart disease Brother   . Stroke Brother   . Coronary artery disease Brother   . Cancer Maternal Aunt     breast, liver,lung  . Cancer Cousin     leukemia    Social History Social History  Substance Use Topics  . Smoking status: Former Smoker    Packs/day: 1.00    Years: 2.00    Types: Cigarettes    Quit date: 12/13/1974  . Smokeless tobacco: Never Used  . Alcohol use No     Allergies   Aspirin and Codeine   Review of Systems Review of Systems  Respiratory: Positive for shortness of breath.   Cardiovascular: Positive for chest pain.   A complete 10 system review of systems was obtained and all systems are negative except as noted in the HPI and PMH.    Physical Exam Updated Vital Signs BP 103/66   Pulse 65   Temp 98.1 F (36.7 C) (Oral)   Resp 13   Ht 5\' 7"  (1.702 m)   Wt 260 lb (117.9  kg)   SpO2 98%   BMI 40.72 kg/m   Physical Exam  Constitutional: She is oriented to person, place, and time. She appears well-developed and well-nourished. She appears distressed.  HENT:  Head: Normocephalic and atraumatic.  Eyes: Conjunctivae are normal. No scleral icterus.  Cardiovascular: Normal rate and regular rhythm.   Pulmonary/Chest: Effort normal and breath sounds normal. No stridor.  Abdominal: Soft. Bowel sounds are  normal. She exhibits no distension. There is no tenderness.  Musculoskeletal: Normal range of motion. She exhibits no tenderness or deformity.  Neurological: She is alert and oriented to person, place, and time.  Skin: Skin is warm and dry. Capillary refill takes less than 2 seconds.  Psychiatric: She has a normal mood and affect.     ED Treatments / Results   DIAGNOSTIC STUDIES: Oxygen Saturation is 97% on room air, adequate by my interpretation.    COORDINATION OF CARE: 9:26 PM Discussed treatment plan with pt at bedside and pt agreed to plan, which includes checking cardiac enzymes and possible admission to hospital.   Labs (all labs ordered are listed, but only abnormal results are displayed) Labs Reviewed  BASIC METABOLIC PANEL - Abnormal; Notable for the following:       Result Value   Glucose, Bld 148 (*)    Creatinine, Ser 1.06 (*)    GFR calc non Af Amer 55 (*)    All other components within normal limits  CBC  I-STAT TROPOININ, ED  I-STAT TROPOININ, ED    EKG  EKG Interpretation  Date/Time:  Saturday January 29 2017 00:17:46 EST Ventricular Rate:  59 PR Interval:    QRS Duration: 96 QT Interval:  439 QTC Calculation: 435 R Axis:   7 Text Interpretation:  Sinus rhythm Atrial premature complex Low voltage, precordial leads Confirmed by Bebe Shaggy  MD, DONALD ([id number]) on 01/29/2017 12:33:41 AM       Radiology Dg Chest Portable 1 View  Result Date: 01/28/2017 CLINICAL DATA:  Sudden onset sharp chest pain radiating to both arms 30  minutes prior to arrival. EXAM: PORTABLE CHEST 1 VIEW COMPARISON:  None. FINDINGS: The heart size and mediastinal contours are within normal limits. Aortic atherosclerosis. Both lungs are clear. The visualized skeletal structures are unremarkable. IMPRESSION: No active disease. Electronically Signed   By: Tollie Eth M.D.   On: 01/28/2017 21:30    Procedures Procedures (including critical care time)  None  Medications Ordered in ED Medications  LORazepam (ATIVAN) tablet 1 mg (1 mg Oral Given 01/28/17 2135)     Initial Impression / Assessment and Plan / ED Course  I have reviewed the triage vital signs and the nursing notes.  Pertinent labs & imaging results that were available during my care of the patient were reviewed by me and considered in my medical decision making (see chart for details).  Patient presents to the emergency department for evaluation of atypical chest pain. Describes it as pins and needle sensation over her chest and bilateral arms. Patient had admission in October 2017 for similar symptoms and was cleared at that time. Most recent cath was 2014 with minimal disease. EKG is similar to prior. Patient with normal exam and CXR. Initial troponin. Reports a severe ASA allergy so will not give. CP is very atypical. Patient is asking for treatment of anxiety symptoms so will give Ativan and plan for serial troponin. Plan to discharge if pain is controlled and repeat troponin is negative.   11:30 PM Patient reports pain resolved. No acute distress. Normal vital signs. Repeat troponin at 12:10 AM.   Care transferred to Dr. Bebe Shaggy who will follow repeat troponin and assist with disposition. Anticipate discharge if troponin is negative. Discussed my plan with the patient who is in agreement prior to sign out.     Final Clinical Impressions(s) / ED Diagnoses   Final diagnoses:  Nonspecific chest pain  Chest pain, unspecified type  New Prescriptions None   I  personally performed the services described in this documentation, which was scribed in my presence. The recorded information has been reviewed and is accurate.    Alona Bene, MD    Maia Plan, MD 01/29/17 [phone]

## 2017-01-29 DIAGNOSIS — R0789 Other chest pain: Secondary | ICD-10-CM | POA: Diagnosis not present

## 2017-01-29 LAB — I-STAT TROPONIN, ED: Troponin i, poc: 0.01 ng/mL (ref 0.00–0.08)

## 2017-01-29 NOTE — ED Notes (Signed)
Pt alert & oriented x4, stable gait. Patient given discharge instructions, paperwork & prescription(s). Patient  instructed to stop at the registration desk to finish any additional paperwork. Patient verbalized understanding. Pt left department w/ no further questions. 

## 2017-01-29 NOTE — ED Provider Notes (Signed)
Plan at signout was to recheck troponin This was negative Repeat EKG unremarkable  EKG Interpretation  Date/Time:  Saturday January 29 2017 00:17:46 EST Ventricular Rate:  59 PR Interval:    QRS Duration: 96 QT Interval:  439 QTC Calculation: 435 R Axis:   7 Text Interpretation:  Sinus rhythm Atrial premature complex Low voltage, precordial leads Confirmed by Bebe Shaggy  MD, Dorinda Hill ([id number]) on 01/29/2017 12:33:41 AM      Will d/c home CP history is very atypical We discussed strict return precautions She has no pain at this time    Zadie Rhine, MD 01/29/17 0040

## 2017-01-29 NOTE — Discharge Instructions (Addendum)

## 2017-02-24 DIAGNOSIS — E782 Mixed hyperlipidemia: Secondary | ICD-10-CM | POA: Diagnosis not present

## 2017-02-24 DIAGNOSIS — E119 Type 2 diabetes mellitus without complications: Secondary | ICD-10-CM | POA: Diagnosis not present

## 2017-03-02 DIAGNOSIS — E782 Mixed hyperlipidemia: Secondary | ICD-10-CM | POA: Diagnosis not present

## 2017-03-02 DIAGNOSIS — E119 Type 2 diabetes mellitus without complications: Secondary | ICD-10-CM | POA: Diagnosis not present

## 2017-03-02 DIAGNOSIS — E039 Hypothyroidism, unspecified: Secondary | ICD-10-CM | POA: Diagnosis not present

## 2017-03-02 DIAGNOSIS — I10 Essential (primary) hypertension: Secondary | ICD-10-CM | POA: Diagnosis not present

## 2017-03-13 ENCOUNTER — Encounter (HOSPITAL_COMMUNITY): Payer: Self-pay

## 2017-03-13 ENCOUNTER — Emergency Department (HOSPITAL_COMMUNITY)
Admission: EM | Admit: 2017-03-13 | Discharge: 2017-03-13 | Disposition: A | Discharge status: Home / Self Care | Payer: Medicare Other | Attending: Emergency Medicine | Admitting: Emergency Medicine

## 2017-03-13 ENCOUNTER — Emergency Department (HOSPITAL_COMMUNITY): Payer: Medicare Other

## 2017-03-13 DIAGNOSIS — Z87891 Personal history of nicotine dependence: Secondary | ICD-10-CM | POA: Diagnosis not present

## 2017-03-13 DIAGNOSIS — E039 Hypothyroidism, unspecified: Secondary | ICD-10-CM | POA: Diagnosis not present

## 2017-03-13 DIAGNOSIS — I10 Essential (primary) hypertension: Secondary | ICD-10-CM | POA: Insufficient documentation

## 2017-03-13 DIAGNOSIS — E119 Type 2 diabetes mellitus without complications: Secondary | ICD-10-CM | POA: Insufficient documentation

## 2017-03-13 DIAGNOSIS — M436 Torticollis: Secondary | ICD-10-CM | POA: Insufficient documentation

## 2017-03-13 DIAGNOSIS — Z7984 Long term (current) use of oral hypoglycemic drugs: Secondary | ICD-10-CM | POA: Diagnosis not present

## 2017-03-13 DIAGNOSIS — M25511 Pain in right shoulder: Secondary | ICD-10-CM | POA: Diagnosis not present

## 2017-03-13 DIAGNOSIS — J45909 Unspecified asthma, uncomplicated: Secondary | ICD-10-CM | POA: Diagnosis not present

## 2017-03-13 MED ORDER — HYDROCODONE-ACETAMINOPHEN 5-325 MG PO TABS: 1.0000 | ORAL_TABLET | ORAL | 0 refills | Status: DC | PRN

## 2017-03-13 MED ORDER — CYCLOBENZAPRINE HCL 10 MG PO TABS: 10.0000 mg | ORAL_TABLET | Freq: Three times a day (TID) | ORAL | 0 refills | Status: DC

## 2017-03-13 MED ORDER — PROMETHAZINE HCL 12.5 MG PO TABS
12.5000 mg | ORAL_TABLET | Freq: Once | ORAL | Status: AC
  Administered 2017-03-13: 12.5 mg via ORAL
  Filled 2017-03-13: qty 1

## 2017-03-13 MED ORDER — DIAZEPAM 5 MG PO TABS
10.0000 mg | ORAL_TABLET | Freq: Once | ORAL | Status: AC
  Administered 2017-03-13: 10 mg via ORAL
  Filled 2017-03-13: qty 2

## 2017-03-13 MED ORDER — KETOROLAC TROMETHAMINE 10 MG PO TABS
10.0000 mg | ORAL_TABLET | Freq: Once | ORAL | Status: AC
  Administered 2017-03-13: 10 mg via ORAL
  Filled 2017-03-13: qty 1

## 2017-03-13 MED ORDER — HYDROCODONE-ACETAMINOPHEN 5-325 MG PO TABS
2.0000 | ORAL_TABLET | Freq: Once | ORAL | Status: AC
  Administered 2017-03-13: 2 via ORAL
  Filled 2017-03-13: qty 2

## 2017-03-13 MED ORDER — MELOXICAM 15 MG PO TABS: 15.0000 mg | ORAL_TABLET | Freq: Every day | ORAL | 0 refills | Status: DC

## 2017-03-13 NOTE — ED Provider Notes (Signed)
[facility]-EMERGENCY DEPT Provider Note   CSN: [id number] Arrival date & time: 03/13/17  1333  By signing my name below, I, Nelwyn Salisbury, attest that this documentation has been prepared under the direction and in the presence of non-physician practitioner, Ivery Quale, PA-C. Electronically Signed: Nelwyn Salisbury, Scribe. 03/13/2017. 1:46 PM.  History   Chief Complaint Chief Complaint  Patient presents with  . Shoulder Pain   The history is provided by the patient. No language interpreter was used.    HPI Comments:  [name] is a 61 y.o. female with pmhx of Fibromyalgia, DM, HTN and arthritis who presents to the Emergency Department complaining of constant, moderate right shoulder pain onset earlier today. Pt states that while she was at church she went to reach for a bible when she heard her shoulder pop. She describes her symptoms as a stabbing pain, exacerbated with use. Pt has tried heating pads at home with no relief. Denies any numbness or weakness.   Past Medical History:  Diagnosis Date  . Asthma   . Chronic back pain   . Chronic leg pain    Bilateral  . Chronic pelvic pain in female   . Essential hypertension   . Fibromyalgia   . H/O cardiac catheterization    No significant CAD (only 20% LAD) 03/02/13 with normal LV function.  Marland Kitchen Headache   . High cholesterol   . Hypothyroidism   . OAB (overactive bladder) 05/21/2013  . Obstructive sleep apnea    Noncompliant with CPAP due to claustophobia.  . Panic attacks   . Rectocele 05/21/2013  . Seizures (HCC)    Had 1 with negative W/U 08/2014  . Type 2 diabetes mellitus [facility])     Patient Active Problem List   Diagnosis Date Noted  . Bruising 10/14/2016  . Chest pain 10/04/2016  . Hypothyroidism 10/04/2016  . Idiopathic intracranial hypertension 11/19/2015  . Papilledema 09/08/2015  . Left facial numbness 08/22/2015  . Epilepsy, generalized, convulsive (HCC) 06/05/2015  . Obstructive sleep apnea 06/05/2015  . Headache,  occipital 06/05/2015  . Neck pain 06/05/2015  . Occipital neuralgia of right side 06/05/2015  . Seizure (HCC) 07/21/2014  . Rectocele 05/21/2013  . OAB (overactive bladder) 05/21/2013  . Difficulty in walking(719.7) 04/11/2013  . Leg weakness, bilateral 04/11/2013  . Diabetes (HCC) 02/28/2013  . Morbid obesity (HCC) 02/28/2013  . Hyperlipidemia 02/28/2013  . CHRONIC OSTEOMYELITIS ANKLE AND FOOT 07/22/2009  . Hypertension 07/22/2009    Past Surgical History:  Procedure Laterality Date  . ABDOMINAL HYSTERECTOMY    . ANKLE SURGERY    . BACK SURGERY    . CARPAL TUNNEL RELEASE    . knee replacements     bilateral  . LEFT HEART CATHETERIZATION WITH CORONARY ANGIOGRAM N/A 03/02/2013   Procedure: LEFT HEART CATHETERIZATION WITH CORONARY ANGIOGRAM;  Surgeon: Peter M Swaziland, MD;  Location: [facility] CATH LAB;  Service: Cardiovascular;  Laterality: N/A;  . TONSILLECTOMY    . TUBAL LIGATION      OB History    Gravida Para Term Preterm AB Living   2 2 2     2    SAB TAB Ectopic Multiple Live Births                   Home Medications    Prior to Admission medications   Medication Sig Start Date End Date Taking? Authorizing Provider  albuterol (PROVENTIL HFA;VENTOLIN HFA) 108 (90 Base) MCG/ACT inhaler Inhale 1-2 puffs into the lungs every 6 (six)  hours as needed for wheezing or shortness of breath.    Historical Provider, MD  atorvastatin (LIPITOR) 40 MG tablet Take 40 mg by mouth at bedtime.  10/15/14   Historical Provider, MD  BELSOMRA 15 MG TABS Take 15 mg by mouth at bedtime as needed (for sleep). Takes 1 tab every other night 04/03/16   Historical Provider, MD  imipramine (TOFRANIL) 50 MG tablet TAKE (1) TABLET BY MOUTH AT BEDTIME. 04/13/16   Adline Potter, NP  levETIRAcetam (KEPPRA) 500 MG tablet Take 1 tablet (500 mg total) by mouth 2 (two) times daily. 05/19/16   Asa Lente, MD  levothyroxine (SYNTHROID, LEVOTHROID) 100 MCG tablet Take 100 mcg by mouth daily. 12/31/16   Historical  Provider, MD  metFORMIN (GLUCOPHAGE) 500 MG tablet Take 500 mg by mouth 2 (two) times daily with a meal.  03/26/14   Historical Provider, MD  metoprolol (LOPRESSOR) 50 MG tablet Take 50 mg by mouth 2 (two) times daily.      Historical Provider, MD  oxybutynin (DITROPAN) 5 MG tablet Take 1 tablet (5 mg total) by mouth 2 (two) times daily. 04/13/16   Adline Potter, NP  RESTASIS 0.05 % ophthalmic emulsion Place 1 drop into both eyes 2 (two) times daily.  08/14/15   Historical Provider, MD    Family History Family History  Problem Relation Age of Onset  . Heart attack Mother   . Diabetes Mother   . Hypertension Mother   . Sick sinus syndrome Brother     Pacemaker  . Congenital heart disease Brother   . Stroke Brother   . Coronary artery disease Brother   . Cancer Maternal Aunt     breast, liver,lung  . Cancer Cousin     leukemia    Social History Social History  Substance Use Topics  . Smoking status: Former Smoker    Packs/day: 1.00    Years: 2.00    Types: Cigarettes    Quit date: 12/13/1974  . Smokeless tobacco: Never Used  . Alcohol use No     Allergies   Aspirin and Codeine   Review of Systems Review of Systems  Musculoskeletal: Positive for arthralgias and myalgias.  Neurological: Negative for weakness and numbness.     Physical Exam Updated Vital Signs BP 116/71 (BP Location: Left Arm)   Pulse (S) (!) 50 Comment: patient states her heart rate is normally low  Temp 98 F (36.7 C) (Oral)   Resp 16   Ht 5\' 7"  (1.702 m)   Wt 262 lb (118.8 kg)   SpO2 100%   BMI 41.04 kg/m   Physical Exam  Constitutional: She is oriented to person, place, and time. She appears well-developed and well-nourished. No distress.  HENT:  Head: Normocephalic and atraumatic.  Eyes: Conjunctivae are normal.  Neck: Normal range of motion. Neck supple. Carotid bruit is not present.  Cardiovascular: Normal rate, regular rhythm and normal heart sounds.   Pulmonary/Chest: Effort  normal and breath sounds normal. She has no wheezes.  Abdominal: She exhibits no distension.  Musculoskeletal: She exhibits tenderness.  Right radial pulse 2+. No atrophy of thenar eminence. Full ROM of all fingers and wrist on right. Full ROM of right elbow. No hematoma or deformity of bicep/tricep area. Pain of the posterior shoulder extending into upper trapezius. Tightness and tenseness of right upper trapezius. Decreased ROM of right shoulder due to pain.   Lymphadenopathy:    She has no cervical adenopathy.  Neurological: She is alert  and oriented to person, place, and time.  Skin: Skin is warm and dry. Capillary refill takes less than 2 seconds.  Psychiatric: She has a normal mood and affect.  Nursing note and vitals reviewed.    ED Treatments / Results  DIAGNOSTIC STUDIES:  Oxygen Saturation is 100% on RA, normal by my interpretation.    COORDINATION OF CARE:  2:12 PM Discussed treatment plan with pt at bedside which includes pain management medications, sling and muscle relaxants and pt agreed to plan. Informed patients of radiological findings.   Labs (all labs ordered are listed, but only abnormal results are displayed) Labs Reviewed - No data to display  EKG  EKG Interpretation None       Radiology No results found.  Procedures Procedures (including critical care time)  Medications Ordered in ED Medications - No data to display   Initial Impression / Assessment and Plan / ED Course  I have reviewed the triage vital signs and the nursing notes.  Pertinent labs & imaging results that were available during my care of the patient were reviewed by me and considered in my medical decision making (see chart for details).      Final Clinical Impressions(s) / ED Diagnoses MDM Patient sustained injury to the right shoulder while reaching down for a Bible today. There no carotid bruits appreciated. There no neurologic deficits appreciated. X-ray of the right  shoulder shows mild glenohumeral degenerative spur formation and degenerative joint disease. There no neurovascular changes appreciated. The examination favors a torticollis.  Patient will be treated with muscle relaxer, anti-inflammatory pain medication, and narcotic pain medication. The patient is provided with a sling. The patient will follow-up with Dr. Romeo Apple for orthopedic evaluation if not improving.    Final diagnoses:  None    New Prescriptions New Prescriptions   No medications on file  **I personally performed the services described in this documentation, which was scribed in my presence. The recorded information has been reviewed and is accurate.Ivery Quale, PA-C 03/13/17 1427    Donnetta Hutching, MD 03/14/17 [id number]

## 2017-03-13 NOTE — ED Triage Notes (Signed)
Patient reports of turning to reach for a bible and felt her right shoulder pop. Reports of pain to right shoulder.

## 2017-03-13 NOTE — Discharge Instructions (Signed)
Your examination suggest a strain of the trapezius area. Please continue to use an ice pack. Please usual sling until symptoms have resolved. Use of Flexeril 3 times daily, meloxicam daily with food, may use Norco for pain if needed. Norco and Flexeril may cause drowsiness, please use these medications with caution. Please do not drive, operate machinery, drink alcohol, or dissipated activities requiring concentration when taking either of these medications. Please see Dr. Romeo Apple for orthopedic evaluation if this issue is not improving in the next 5-7 days.

## 2017-03-13 NOTE — ED Notes (Signed)
Left shoulder elevated with rolled up blankets

## 2017-04-11 DIAGNOSIS — S80812A Abrasion, left lower leg, initial encounter: Secondary | ICD-10-CM | POA: Diagnosis not present

## 2017-04-23 ENCOUNTER — Emergency Department (HOSPITAL_COMMUNITY): Payer: Medicare Other

## 2017-04-23 ENCOUNTER — Encounter (HOSPITAL_COMMUNITY): Payer: Self-pay | Admitting: Emergency Medicine

## 2017-04-23 ENCOUNTER — Emergency Department (HOSPITAL_COMMUNITY)
Admission: EM | Admit: 2017-04-23 | Discharge: 2017-04-23 | Disposition: A | Discharge status: Home / Self Care | Payer: Medicare Other | Attending: Emergency Medicine | Admitting: Emergency Medicine

## 2017-04-23 DIAGNOSIS — M79645 Pain in left finger(s): Secondary | ICD-10-CM | POA: Diagnosis not present

## 2017-04-23 DIAGNOSIS — Z79899 Other long term (current) drug therapy: Secondary | ICD-10-CM | POA: Diagnosis not present

## 2017-04-23 DIAGNOSIS — R2231 Localized swelling, mass and lump, right upper limb: Secondary | ICD-10-CM | POA: Diagnosis not present

## 2017-04-23 DIAGNOSIS — M65842 Other synovitis and tenosynovitis, left hand: Secondary | ICD-10-CM | POA: Diagnosis not present

## 2017-04-23 DIAGNOSIS — Z87891 Personal history of nicotine dependence: Secondary | ICD-10-CM | POA: Diagnosis not present

## 2017-04-23 DIAGNOSIS — M659 Synovitis and tenosynovitis, unspecified: Secondary | ICD-10-CM

## 2017-04-23 DIAGNOSIS — E039 Hypothyroidism, unspecified: Secondary | ICD-10-CM | POA: Diagnosis not present

## 2017-04-23 DIAGNOSIS — I10 Essential (primary) hypertension: Secondary | ICD-10-CM | POA: Diagnosis not present

## 2017-04-23 DIAGNOSIS — J45909 Unspecified asthma, uncomplicated: Secondary | ICD-10-CM | POA: Insufficient documentation

## 2017-04-23 MED ORDER — NAPROXEN 500 MG PO TABS: 500.0000 mg | ORAL_TABLET | Freq: Two times a day (BID) | ORAL | 0 refills | Status: DC

## 2017-04-23 NOTE — Discharge Instructions (Signed)
Your pain and swelling are from inflammation in the flexor tendon of your finger. Wear the splint at all times until your finger is feeling better. Use ice and/or heat as needed. Return if pain and swelling are getting worse, or if you start running a fever. Make a follow-up appointment with the hand specialist.

## 2017-04-23 NOTE — ED Notes (Signed)
Pt alert and oriented x 4. Stable gait. Pt given discharge papers/prescriptions. Pt told to stop by registration to complete any additional paperwork. Pt left the department with no further questions.Pt alert and oriented x 4. Stable gait. Pt given discharge papers/prescriptions. Pt told to stop by registration to complete any additional paperwork. Pt left the department with no further questions.

## 2017-04-23 NOTE — ED Notes (Addendum)
POC CBG of 151

## 2017-04-23 NOTE — ED Provider Notes (Signed)
[facility]-EMERG[facility]Y DEPT Provider Note   CSN: [id number] Arrival date & time: 04/23/17  0426     History   Chief Complaint Chief Complaint  Patient presents with  . Hand Pain    HPI [name] is a 61 y.o. female.  She has a history of diabetes. For the past 3 days, she has had pain in her left fourth finger. Pain is worse with movement. Pain got significantly worse tonight. She denies any trauma. She rates pain at 9/10. She took ibuprofen without any relief. She denies fever, chills, sweats. She has not checked her blood glucose recently.   The history is provided by the patient.    Past Medical History:  Diagnosis Date  . Asthma   . Chronic back pain   . Chronic leg pain    Bilateral  . Chronic pelvic pain in female   . Essential hypertension   . Fibromyalgia   . H/O cardiac catheterization    No significant CAD (only 20% LAD) 03/02/13 with normal LV function.  Marland Kitchen Headache   . High cholesterol   . Hypothyroidism   . OAB (overactive bladder) 05/21/2013  . Obstructive sleep apnea    Noncompliant with CPAP due to claustophobia.  . Panic attacks   . Rectocele 05/21/2013  . Seizures (HCC)    Had 1 with negative W/U 08/2014  . Type 2 diabetes mellitus [facility])     Patient Active Problem List   Diagnosis Date Noted  . Bruising 10/14/2016  . Chest pain 10/04/2016  . Hypothyroidism 10/04/2016  . Idiopathic intracranial hypertension 11/19/2015  . Papilledema 09/08/2015  . Left facial numbness 08/22/2015  . Epilepsy, generalized, convulsive (HCC) 06/05/2015  . Obstructive sleep apnea 06/05/2015  . Headache, occipital 06/05/2015  . Neck pain 06/05/2015  . Occipital neuralgia of right side 06/05/2015  . Seizure (HCC) 07/21/2014  . Rectocele 05/21/2013  . OAB (overactive bladder) 05/21/2013  . Difficulty in walking(719.7) 04/11/2013  . Leg weakness, bilateral 04/11/2013  . Diabetes (HCC) 02/28/2013  . Morbid obesity (HCC) 02/28/2013  . Hyperlipidemia 02/28/2013  .  CHRONIC OSTEOMYELITIS ANKLE AND FOOT 07/22/2009  . Hypertension 07/22/2009    Past Surgical History:  Procedure Laterality Date  . ABDOMINAL HYSTERECTOMY    . ANKLE SURGERY    . BACK SURGERY    . CARPAL TUNNEL RELEASE    . knee replacements     bilateral  . LEFT HEART CATHETERIZATION WITH CORONARY ANGIOGRAM N/A 03/02/2013   Procedure: LEFT HEART CATHETERIZATION WITH CORONARY ANGIOGRAM;  Surgeon: Peter M Swaziland, MD;  Location[facility] CATH LAB;  Service: Cardiovascular;  Laterality: N/A;  . TONSILLECTOMY    . TUBAL LIGATION      OB History    Gravida Para Term Preterm AB Living   2 2 2     2    SAB TAB Ectopic Multiple Live Births                   Home Medications    Prior to Admission medications   Medication Sig Start Date End Date Taking? Authorizing Provider  albuterol (PROVENTIL HFA;VENTOLIN HFA) 108 (90 Base) MCG/ACT inhaler Inhale 1-2 puffs into the lungs every 6 (six) hours as needed for wheezing or shortness of breath.    [provider]  atorvastatin (LIPITOR) 40 MG tablet Take 40 mg by mouth at bedtime.  10/15/14   [provider]  BELSOMRA 15 MG TABS Take 15 mg by mouth at bedtime as needed (for sleep). Takes  1 tab every other night 04/03/16   [provider]  cyclobenzaprine (FLEXERIL) 10 MG tablet Take 1 tablet (10 mg total) by mouth 3 (three) times daily. 03/13/17   Ivery Quale, PA-C  HYDROcodone-acetaminophen (NORCO/VICODIN) 5-325 MG tablet Take 1 tablet by mouth every 4 (four) hours as needed. 03/13/17   Ivery Quale, PA-C  imipramine (TOFRANIL) 50 MG tablet TAKE (1) TABLET BY MOUTH AT BEDTIME. 04/13/16   Adline Potter, NP  levETIRAcetam (KEPPRA) 500 MG tablet Take 1 tablet (500 mg total) by mouth 2 (two) times daily. 05/19/16   Sater, Pearletha Furl, MD  levothyroxine (SYNTHROID, LEVOTHROID) 100 MCG tablet Take 100 mcg by mouth daily. 12/31/16   [provider]  meloxicam (MOBIC) 15 MG tablet Take 1 tablet (15 mg total) by mouth daily.  Please take with food. 03/13/17   Ivery Quale, PA-C  metFORMIN (GLUCOPHAGE) 500 MG tablet Take 500 mg by mouth 2 (two) times daily with a meal.  03/26/14   [provider]  metoprolol (LOPRESSOR) 50 MG tablet Take 50 mg by mouth 2 (two) times daily.      [provider]  oxybutynin (DITROPAN) 5 MG tablet Take 1 tablet (5 mg total) by mouth 2 (two) times daily. 04/13/16   Adline Potter, NP  RESTASIS 0.05 % ophthalmic emulsion Place 1 drop into both eyes 2 (two) times daily.  08/14/15   [provider]    Family History Family History  Problem Relation Age of Onset  . Heart attack Mother   . Diabetes Mother   . Hypertension Mother   . Sick sinus syndrome Brother        Pacemaker  . Congenital heart disease Brother   . Stroke Brother   . Coronary artery disease Brother   . Cancer Maternal Aunt        breast, liver,lung  . Cancer Cousin        leukemia    Social History Social History  Substance Use Topics  . Smoking status: Former Smoker    Packs/day: 1.00    Years: 2.00    Types: Cigarettes    Quit date: 12/13/1974  . Smokeless tobacco: Never Used  . Alcohol use No     Allergies   Aspirin and Codeine   Review of Systems Review of Systems  All other systems reviewed and are negative.    Physical Exam Updated Vital Signs BP 127/77 (BP Location: Left Arm)   Pulse 73   Temp 97.7 F (36.5 C) (Oral)   Resp 20   Ht 5\' 7"  (1.702 m)   Wt 262 lb (118.8 kg)   SpO2 96%   BMI 41.04 kg/m   Physical Exam  Nursing note and vitals reviewed.  61 year old female, resting comfortably and in no acute distress. Vital signs are normal. Oxygen saturation is 96%, which is normal. Head is normocephalic and atraumatic. PERRLA, EOMI. Oropharynx is clear. Neck is nontender and supple without adenopathy or JVD. Back is nontender and there is no CVA tenderness. Lungs are clear without rales, wheezes, or rhonchi. Chest is nontender. Heart has regular rate  and rhythm without murmur. Abdomen is soft, flat, nontender without masses or hepatosplenomegaly and peristalsis is normoactive. Extremities have no cyanosis or edema. There is swelling of the left fourth finger which is more prominent on the volar side. There is tenderness to palpation throughout the proximal and middle phalanges of the left fourth finger with tenderness extending on the palm are aspect  over the distal third of the fourth metacarpal. There is pain with both passive and active range of motion of the left fourth finger-both flexion and extension. Pain is also elicited with passive flexion of the left third and fifth fingers. There is no erythema or warmth. Capillary refill is prompt. Skin is warm and dry without rash. Neurologic: Mental status is normal, cranial nerves are intact, there are no motor or sensory deficits.  ED Treatments / Results  Labs (all labs ordered are listed, but only abnormal results are displayed) Labs Reviewed  CBG MONITORING, ED   Radiology Dg Hand Complete Left  Result Date: 04/23/2017 CLINICAL DATA:  Left ring finger pain x 2-3 days; worsening today; pain radiating down into left hand EXAM: LEFT HAND - COMPLETE 3+ VIEW COMPARISON:  Left wrist 08/23/2010 FINDINGS: Soft tissue swelling about the proximal aspect of the left third and fourth fingers. No radiopaque soft tissue foreign bodies or soft tissue gas collections demonstrated. Bones appear intact. No evidence of acute fracture or dislocation. No focal bone lesion or bone destruction. Joint spaces are preserved. IMPRESSION: Soft tissue swelling over the proximal left third and fourth fingers. No acute bony abnormalities. Electronically Signed   By: Burman Nieves M.D.   On: 04/23/2017 05:55    Procedures Procedures (including critical care time)  Medications Ordered in ED Medications - No data to display   Initial Impression / Assessment and Plan / ED Course  I have reviewed the triage vital  signs and the nursing notes.  Pertinent labs & imaging results that were available during my care of the patient were reviewed by me and considered in my medical decision making (see chart for details).  Pain and swelling of the left fourth finger. Clinically, this seems most consistent with flexor tenosynovium right is. We'll check x-ray to rule out occult fracture. Will also check CBG. No evidence of infection on exam.  X-rays are unremarkable. Capillary blood glucose is 151. Splint is applied and she is discharged with prescription for naproxen. Referred to hand surgery for follow-up.  Final Clinical Impressions(s) / ED Diagnoses   Final diagnoses:  Flexor tenosynovitis of finger    New Prescriptions New Prescriptions   NAPROXEN (NAPROSYN) 500 MG TABLET    Take 1 tablet (500 mg total) by mouth 2 (two) times daily.     Dione Booze, MD 04/23/17 [phone]

## 2017-04-25 LAB — CBG MONITORING, ED: Glucose-Capillary: 141 mg/dL — ABNORMAL HIGH (ref 65–99)

## 2017-05-04 ENCOUNTER — Ambulatory Visit (INDEPENDENT_AMBULATORY_CARE_PROVIDER_SITE_OTHER): Payer: Medicare Other | Admitting: Adult Health

## 2017-05-04 ENCOUNTER — Encounter: Payer: Self-pay | Admitting: Adult Health

## 2017-05-04 VITALS — BP 110/78 | HR 72 | Ht 64.0 in | Wt 258.5 lb

## 2017-05-04 DIAGNOSIS — Z1211 Encounter for screening for malignant neoplasm of colon: Secondary | ICD-10-CM

## 2017-05-04 DIAGNOSIS — Z1212 Encounter for screening for malignant neoplasm of rectum: Secondary | ICD-10-CM

## 2017-05-04 DIAGNOSIS — Z01419 Encounter for gynecological examination (general) (routine) without abnormal findings: Secondary | ICD-10-CM | POA: Diagnosis not present

## 2017-05-04 DIAGNOSIS — N393 Stress incontinence (female) (male): Secondary | ICD-10-CM

## 2017-05-04 DIAGNOSIS — B369 Superficial mycosis, unspecified: Secondary | ICD-10-CM

## 2017-05-04 DIAGNOSIS — N3281 Overactive bladder: Secondary | ICD-10-CM

## 2017-05-04 LAB — HEMOCCULT GUIAC POC 1CARD (OFFICE): FECAL OCCULT BLD: NEGATIVE

## 2017-05-04 MED ORDER — IMIPRAMINE HCL 50 MG PO TABS: ORAL_TABLET | ORAL | 12 refills | Status: DC

## 2017-05-04 MED ORDER — OXYBUTYNIN CHLORIDE 5 MG PO TABS: ORAL_TABLET | ORAL | 12 refills | Status: DC

## 2017-05-04 MED ORDER — NYSTATIN-TRIAMCINOLONE 100000-0.1 UNIT/GM-% EX CREA: 1.0000 "application " | TOPICAL_CREAM | Freq: Two times a day (BID) | CUTANEOUS | 2 refills | Status: DC

## 2017-05-04 NOTE — Progress Notes (Signed)
Patient ID: [name], female   DOB: Sep 17, 1956, 61 y.o.   MRN: [id number] History of Present Illness:  [name] is a 61 year old white female, married, sp hysterectomy in for well woman gyn exam. She does not think oxybutynin working as well is leaking with coughing or straining.  PCP is Dr [name].She sees [facility] in [place] too.   Current Medications, Allergies, Past Medical History, Past Surgical History, Family History and Social History were reviewed in [employer] record.     Review of Systems: Patient denies any headaches, hearing loss, fatigue, blurred vision, shortness of breath, chest pain, abdominal pain, problems with bowel movements(has constipation at times, and uses miralax) or intercourse(not having sex). No joint pain or mood swings. +urine leakage and OAB.     Physical Exam:BP 110/78 (BP Location: Left Arm, Patient Position: Sitting, Cuff Size: Large)   Pulse 72   Ht 5\' 4"  (1.626 m)   Wt 258 lb 8 oz (117.3 kg)   BMI 44.37 kg/m  General:  Well developed, well nourished, no acute distress Skin:  Warm and dry Neck:  Midline trachea, normal thyroid, good ROM, no lymphadenopathy,no carotid bruits heard Lungs; Clear to auscultation bilaterally Breast:  No dominant palpable mass, retraction, or nipple discharge Cardiovascular: Regular rate and rhythm Abdomen:  Soft, non tender, no hepatosplenomegaly,skin fungus in navel at times,better now Pelvic:  External genitalia is normal in appearance, no lesions.  The vagina is normal in appearance. Urethra has no lesions or masses. The cervix and uterus are absent.   No adnexal masses or tenderness noted.Bladder is non tender, no masses felt. Rectal: Good sphincter tone, no polyps, or hemorrhoids felt.  Hemoccult negative. Extremities/musculoskeletal:  No swelling or varicosities noted, no clubbing or cyanosis Psych:  No mood changes, alert and cooperative,seems happy PHQ 9 score 16, she was  on meds from PCP but stopped affected speech, she denies any suicidal ideation, says she gets depressed over family drama.did mention counseling to her.  Will increase oxybutynin 5 mg to 2 in am 1 at mid day and 1 in pm to see if helps. Impression: 1. Well woman exam with routine gynecological exam   2. OAB (overactive bladder)   3. Screening for colorectal cancer   4. Superficial fungus infection of skin   5.       SUI    Plan: Meds ordered this encounter  Medications  . ibuprofen (ADVIL,MOTRIN) 200 MG tablet    Sig: Take 400 mg by mouth as needed.  . nystatin-triamcinolone (MYCOLOG II) cream    Sig: Apply 1 application topically 2 (two) times daily.    Dispense:  30 g    Refill:  2    Order Specific Question:   Supervising Provider    Answer:   , LUTHER H [[id number]]  . imipramine (TOFRANIL) 50 MG tablet    Sig: TAKE (1) TABLET BY MOUTH AT BEDTIME.    Dispense:  30 tablet    Refill:  12    Order Specific Question:   Supervising Provider    Answer:   Despina Hidden, LUTHER H [[id number]]  . oxybutynin (DITROPAN) 5 MG tablet    Sig: Take 2 in am, 1 at mid day and 1 in pm    Dispense:  120 tablet    Refill:  12    Order Specific Question:   Supervising Provider    Answer:   Despina Hidden [[id number]]  Physical in 1 year Mammogram yearly Labs with  PCP  Referred to [facility] for colonoscopy at one at 49, but not sure who did it.

## 2017-05-16 ENCOUNTER — Emergency Department (HOSPITAL_COMMUNITY)
Admission: EM | Admit: 2017-05-16 | Discharge: 2017-05-16 | Disposition: A | Discharge status: Home / Self Care | Payer: Medicare Other | Attending: Emergency Medicine | Admitting: Emergency Medicine

## 2017-05-16 ENCOUNTER — Emergency Department (HOSPITAL_COMMUNITY): Payer: Medicare Other

## 2017-05-16 ENCOUNTER — Encounter (HOSPITAL_COMMUNITY): Payer: Self-pay

## 2017-05-16 DIAGNOSIS — Z7984 Long term (current) use of oral hypoglycemic drugs: Secondary | ICD-10-CM | POA: Insufficient documentation

## 2017-05-16 DIAGNOSIS — R0789 Other chest pain: Secondary | ICD-10-CM | POA: Diagnosis not present

## 2017-05-16 DIAGNOSIS — R079 Chest pain, unspecified: Secondary | ICD-10-CM | POA: Diagnosis not present

## 2017-05-16 DIAGNOSIS — E119 Type 2 diabetes mellitus without complications: Secondary | ICD-10-CM | POA: Diagnosis not present

## 2017-05-16 DIAGNOSIS — I10 Essential (primary) hypertension: Secondary | ICD-10-CM | POA: Insufficient documentation

## 2017-05-16 DIAGNOSIS — J45909 Unspecified asthma, uncomplicated: Secondary | ICD-10-CM | POA: Insufficient documentation

## 2017-05-16 DIAGNOSIS — Z79899 Other long term (current) drug therapy: Secondary | ICD-10-CM | POA: Diagnosis not present

## 2017-05-16 DIAGNOSIS — E039 Hypothyroidism, unspecified: Secondary | ICD-10-CM | POA: Diagnosis not present

## 2017-05-16 DIAGNOSIS — Z87891 Personal history of nicotine dependence: Secondary | ICD-10-CM | POA: Insufficient documentation

## 2017-05-16 LAB — BASIC METABOLIC PANEL
Anion gap: 10 (ref 5–15)
BUN: 21 mg/dL — ABNORMAL HIGH (ref 6–20)
CALCIUM: 9.2 mg/dL (ref 8.9–10.3)
CHLORIDE: 106 mmol/L (ref 101–111)
CO2: 24 mmol/L (ref 22–32)
CREATININE: 0.93 mg/dL (ref 0.44–1.00)
GFR calc Af Amer: >60 mL/min (ref >60)
GFR calc non Af Amer: >60 mL/min (ref >60)
GLUCOSE: 101 mg/dL — AB (ref 65–99)
Potassium: 3.8 mmol/L (ref 3.5–5.1)
Sodium: 140 mmol/L (ref 135–145)

## 2017-05-16 LAB — CBC WITH DIFFERENTIAL/PLATELET
Basophils Absolute: 0.1 10*3/uL (ref 0.0–0.1)
Basophils Relative: 1 %
Eosinophils Absolute: 0.2 10*3/uL (ref 0.0–0.7)
Eosinophils Relative: 3 %
HEMATOCRIT: 41.4 % (ref 36.0–46.0)
Hemoglobin: 13.8 g/dL (ref 12.0–15.0)
LYMPHS ABS: 2.1 10*3/uL (ref 0.7–4.0)
LYMPHS PCT: 38 %
MCH: 29.6 pg (ref 26.0–34.0)
MCHC: 33.3 g/dL (ref 30.0–36.0)
MCV: 88.7 fL (ref 78.0–100.0)
MONO ABS: 0.4 10*3/uL (ref 0.1–1.0)
MONOS PCT: 7 %
NEUTROS ABS: 2.9 10*3/uL (ref 1.7–7.7)
Neutrophils Relative %: 51 %
Platelets: 188 10*3/uL (ref 150–400)
RBC: 4.67 MIL/uL (ref 3.87–5.11)
RDW: 12.5 % (ref 11.5–15.5)
WBC: 5.5 10*3/uL (ref 4.0–10.5)

## 2017-05-16 LAB — HEPATIC FUNCTION PANEL
ALT: 22 U/L (ref 14–54)
AST: 28 U/L (ref 15–41)
Albumin: 3.9 g/dL (ref 3.5–5.0)
Alkaline Phosphatase: 91 U/L (ref 38–126)
BILIRUBIN DIRECT: 0.2 mg/dL (ref 0.1–0.5)
BILIRUBIN TOTAL: 0.7 mg/dL (ref 0.3–1.2)
Indirect Bilirubin: 0.5 mg/dL (ref 0.3–0.9)
Total Protein: 6.9 g/dL (ref 6.5–8.1)

## 2017-05-16 LAB — LIPASE, BLOOD: LIPASE: 28 U/L (ref 11–51)

## 2017-05-16 LAB — TROPONIN I

## 2017-05-16 LAB — I-STAT TROPONIN, ED: Troponin i, poc: 0 ng/mL (ref 0.00–0.08)

## 2017-05-16 MED ORDER — GI COCKTAIL ~~LOC~~
30.0000 mL | Freq: Once | ORAL | Status: AC
  Administered 2017-05-16: 30 mL via ORAL
  Filled 2017-05-16: qty 30

## 2017-05-16 MED ORDER — MORPHINE SULFATE (PF) 4 MG/ML IV SOLN
4.0000 mg | Freq: Once | INTRAVENOUS | Status: AC
  Administered 2017-05-16: 4 mg via INTRAVENOUS
  Filled 2017-05-16: qty 1

## 2017-05-16 MED ORDER — PANTOPRAZOLE SODIUM 40 MG IV SOLR
40.0000 mg | Freq: Once | INTRAVENOUS | Status: AC
  Administered 2017-05-16: 40 mg via INTRAVENOUS
  Filled 2017-05-16: qty 40

## 2017-05-16 MED ORDER — PANTOPRAZOLE SODIUM 20 MG PO TBEC: 20.0000 mg | DELAYED_RELEASE_TABLET | Freq: Every day | ORAL | 0 refills | Status: DC

## 2017-05-16 NOTE — ED Triage Notes (Signed)
Pt reports started having a stabbing chest pain in center and left side of chest x 2 hours.  Says was sitting on the couch when the pain started.  Pt says feels like she is choking.

## 2017-05-16 NOTE — Discharge Instructions (Signed)
Follow up with your md this week or next for recheck

## 2017-05-16 NOTE — ED Provider Notes (Signed)
[facility]-EMERGENCY DEPT Provider Note   CSN: [id number] Arrival date & time: 05/16/17  0744     History   Chief Complaint Chief Complaint  Patient presents with  . Chest Pain    HPI [name] is a 61 y.o. female.  Patient has been complaining of some epigastric discomfort now going into her mid chest.   The history is provided by the patient. No language interpreter was used.  Chest Pain   This is a recurrent problem. The current episode started more than 2 days ago. The problem occurs daily. The problem has not changed since onset.Associated with: Laying down. The pain is present in the substernal region. The pain is at a severity of 4/10. The pain is moderate. The quality of the pain is described as burning. The pain does not radiate. Pertinent negatives include no abdominal pain, no back pain, no cough and no headaches.  Pertinent negatives for past medical history include no seizures.    Past Medical History:  Diagnosis Date  . Asthma   . Chronic back pain   . Chronic leg pain    Bilateral  . Chronic pelvic pain in female   . Essential hypertension   . Fibromyalgia   . H/O cardiac catheterization    No significant CAD (only 20% LAD) 03/02/13 with normal LV function.  Marland Kitchen Headache   . High cholesterol   . Hypothyroidism   . Mental disorder   . OAB (overactive bladder) 05/21/2013  . Obstructive sleep apnea    Noncompliant with CPAP due to claustophobia.  . Panic attacks   . Rectocele 05/21/2013  . Seizures (HCC)    Had 1 with negative W/U 08/2014  . Type 2 diabetes mellitus [facility])     Patient Active Problem List   Diagnosis Date Noted  . Superficial fungus infection of skin 05/04/2017  . Bruising 10/14/2016  . Chest pain 10/04/2016  . Hypothyroidism 10/04/2016  . Idiopathic intracranial hypertension 11/19/2015  . Papilledema 09/08/2015  . Left facial numbness 08/22/2015  . Epilepsy, generalized, convulsive (HCC) 06/05/2015  . Obstructive sleep apnea 06/05/2015    . Headache, occipital 06/05/2015  . Neck pain 06/05/2015  . Occipital neuralgia of right side 06/05/2015  . Seizure (HCC) 07/21/2014  . Rectocele 05/21/2013  . OAB (overactive bladder) 05/21/2013  . Difficulty in walking(719.7) 04/11/2013  . Leg weakness, bilateral 04/11/2013  . Diabetes (HCC) 02/28/2013  . Morbid obesity (HCC) 02/28/2013  . Hyperlipidemia 02/28/2013  . CHRONIC OSTEOMYELITIS ANKLE AND FOOT 07/22/2009  . Hypertension 07/22/2009    Past Surgical History:  Procedure Laterality Date  . ABDOMINAL HYSTERECTOMY    . ANKLE SURGERY    . BACK SURGERY    . CARPAL TUNNEL RELEASE    . knee replacements     bilateral  . LEFT HEART CATHETERIZATION WITH CORONARY ANGIOGRAM N/A 03/02/2013   Procedure: LEFT HEART CATHETERIZATION WITH CORONARY ANGIOGRAM;  Surgeon: Peter M Swaziland, MD;  Location: [facility] CATH LAB;  Service: Cardiovascular;  Laterality: N/A;  . TONSILLECTOMY    . TUBAL LIGATION      OB History    Gravida Para Term Preterm AB Living   2 2 2     2    SAB TAB Ectopic Multiple Live Births           2       Home Medications    Prior to Admission medications   Medication Sig Start Date End Date Taking? Authorizing Provider  albuterol (PROVENTIL HFA;VENTOLIN HFA) 108 (  90 Base) MCG/ACT inhaler Inhale 1-2 puffs into the lungs every 6 (six) hours as needed for wheezing or shortness of breath.    [provider]  atorvastatin (LIPITOR) 40 MG tablet Take 40 mg by mouth at bedtime.  10/15/14   [provider]  clindamycin (CLEOCIN) 300 MG capsule  05/14/17   [provider]  ibuprofen (ADVIL,MOTRIN) 200 MG tablet Take 400 mg by mouth as needed.    [provider]  imipramine (TOFRANIL) 50 MG tablet TAKE (1) TABLET BY MOUTH AT BEDTIME. 05/04/17   Adline Potter, NP  levETIRAcetam (KEPPRA) 500 MG tablet Take 1 tablet (500 mg total) by mouth 2 (two) times daily. 05/19/16   Sater, Pearletha Furl, MD  levothyroxine (SYNTHROID, LEVOTHROID) 100 MCG  tablet Take 100 mcg by mouth daily. 12/31/16   [provider]  metFORMIN (GLUCOPHAGE) 500 MG tablet Take 500 mg by mouth 2 (two) times daily with a meal.  03/26/14   [provider]  metoprolol (LOPRESSOR) 50 MG tablet Take 50 mg by mouth 2 (two) times daily.      [provider]  nystatin cream (MYCOSTATIN)  05/04/17   [provider]  nystatin-triamcinolone (MYCOLOG II) cream Apply 1 application topically 2 (two) times daily. 05/04/17   Adline Potter, NP  oxybutynin (DITROPAN) 5 MG tablet Take 2 in am, 1 at mid day and 1 in pm 05/04/17   Cyril Mourning A, NP  pantoprazole (PROTONIX) 20 MG tablet Take 1 tablet (20 mg total) by mouth daily. 05/16/17   Bethann Berkshire, MD  triamcinolone cream (KENALOG) 0.1 %  05/04/17   [provider]    Family History Family History  Problem Relation Age of Onset  . Heart attack Mother   . Diabetes Mother   . Hypertension Mother   . Sick sinus syndrome Brother        Pacemaker  . Congenital heart disease Brother   . Stroke Brother   . Coronary artery disease Brother   . Cancer Maternal Aunt        breast, liver,lung  . Cancer Cousin        leukemia    Social History Social History  Substance Use Topics  . Smoking status: Former Smoker    Packs/day: 1.00    Years: 2.00    Types: Cigarettes    Quit date: 12/13/1974  . Smokeless tobacco: Never Used  . Alcohol use No     Allergies   Aspirin and Codeine   Review of Systems Review of Systems  Constitutional: Negative for appetite change and fatigue.  HENT: Negative for congestion, ear discharge and sinus pressure.   Eyes: Negative for discharge.  Respiratory: Negative for cough.   Cardiovascular: Positive for chest pain.  Gastrointestinal: Negative for abdominal pain and diarrhea.  Genitourinary: Negative for frequency and hematuria.  Musculoskeletal: Negative for back pain.  Skin: Negative for rash.  Neurological: Negative for seizures and  headaches.  Psychiatric/Behavioral: Negative for hallucinations.     Physical Exam Updated Vital Signs BP 116/78 (BP Location: Left Arm)   Pulse (!) 55   Temp 97.7 F (36.5 C) (Oral)   Resp 18   Ht 5\' 4"  (1.626 m)   Wt 117 kg (258 lb)   SpO2 99%   BMI 44.29 kg/m   Physical Exam  Constitutional: She is oriented to person, place, and time. She appears well-developed.  HENT:  Head: Normocephalic.  Eyes: Conjunctivae and EOM are normal. No scleral icterus.  Neck: Neck supple. No thyromegaly present.  Cardiovascular: Normal rate and regular rhythm.  Exam reveals no gallop and no friction rub.   No murmur heard. Pulmonary/Chest: No stridor. She has no wheezes. She has no rales. She exhibits no tenderness.  Abdominal: She exhibits no distension. There is no tenderness. There is no rebound.  Musculoskeletal: Normal range of motion. She exhibits no edema.  Lymphadenopathy:    She has no cervical adenopathy.  Neurological: She is oriented to person, place, and time. She exhibits normal muscle tone. Coordination normal.  Skin: No rash noted. No erythema.  Psychiatric: She has a normal mood and affect. Her behavior is normal.     ED Treatments / Results  Labs (all labs ordered are listed, but only abnormal results are displayed) Labs Reviewed  BASIC METABOLIC PANEL - Abnormal; Notable for the following:       Result Value   Glucose, Bld 101 (*)    BUN 21 (*)    All other components within normal limits  CBC WITH DIFFERENTIAL/PLATELET  HEPATIC FUNCTION PANEL  LIPASE, BLOOD  TROPONIN I  I-STAT TROPOININ, ED    EKG  EKG Interpretation  Date/Time:  Monday May 16 2017 07:54:20 EDT Ventricular Rate:  56 PR Interval:    QRS Duration: 96 QT Interval:  425 QTC Calculation: 411 R Axis:   8 Text Interpretation:  Sinus rhythm Low voltage, precordial leads Confirmed by Lashannon Bresnan  MD, Jomarie Longs ([id number]) on 05/16/2017 8:45:58 AM Also confirmed by Tiasha Helvie  MD, Jomarie Longs [phone])  on 05/16/2017  12:43:59 PM       Radiology Dg Chest 2 View  Result Date: 05/16/2017 CLINICAL DATA:  Stabbing central and left-sided chest pain over the last 2 hours. EXAM: CHEST  2 VIEW COMPARISON:  01/28/2017 FINDINGS: Artifact overlies the chest. Poor inspiration. Allowing for that, no active disease is identified. Heart size is normal. Pulmonary vascularity is normal. Lungs are clear. No effusions. IMPRESSION: Overlying artifact.  Poor inspiration.  No active disease suspected. Electronically Signed   By: Paulina Fusi M.D.   On: 05/16/2017 08:33    Procedures Procedures (including critical care time)  Medications Ordered in ED Medications  gi cocktail (Maalox,Lidocaine,Donnatal) (30 mLs Oral Given 05/16/17 1028)  pantoprazole (PROTONIX) injection 40 mg (40 mg Intravenous Given 05/16/17 1318)  morphine 4 MG/ML injection 4 mg (4 mg Intravenous Given 05/16/17 1318)     Initial Impression / Assessment and Plan / ED Course  I have reviewed the triage vital signs and the nursing notes.  Pertinent labs & imaging results that were available during my care of the patient were reviewed by me and considered in my medical decision making (see chart for details).     EKG. Troponin 2. Chest x-ray unremarkable. Patient improved with GI cocktail. She was evaluated by cardiology in the fall and her chest pain was noncardiac.Marland Kitchen She will be placed on protonic will follow-up with her PCP  Final Clinical Impressions(s) / ED Diagnoses   Final diagnoses:  Atypical chest pain    New Prescriptions New Prescriptions   PANTOPRAZOLE (PROTONIX) 20 MG TABLET    Take 1 tablet (20 mg total) by mouth daily.     Bethann Berkshire, MD 05/16/17 1459

## 2017-05-19 ENCOUNTER — Encounter: Payer: Self-pay | Admitting: Nurse Practitioner

## 2017-05-19 ENCOUNTER — Ambulatory Visit (INDEPENDENT_AMBULATORY_CARE_PROVIDER_SITE_OTHER): Payer: Medicare Other | Admitting: Nurse Practitioner

## 2017-05-19 VITALS — BP 112/74 | HR 50 | Ht 64.0 in | Wt 253.6 lb

## 2017-05-19 DIAGNOSIS — R51 Headache: Secondary | ICD-10-CM | POA: Diagnosis not present

## 2017-05-19 DIAGNOSIS — G932 Benign intracranial hypertension: Secondary | ICD-10-CM | POA: Diagnosis not present

## 2017-05-19 DIAGNOSIS — R519 Headache, unspecified: Secondary | ICD-10-CM

## 2017-05-19 DIAGNOSIS — G4733 Obstructive sleep apnea (adult) (pediatric): Secondary | ICD-10-CM

## 2017-05-19 DIAGNOSIS — G40309 Generalized idiopathic epilepsy and epileptic syndromes, not intractable, without status epilepticus: Secondary | ICD-10-CM | POA: Diagnosis not present

## 2017-05-19 MED ORDER — LEVETIRACETAM 500 MG PO TABS: 500.0000 mg | ORAL_TABLET | Freq: Two times a day (BID) | ORAL | 11 refills | Status: DC

## 2017-05-19 NOTE — Progress Notes (Signed)
[facility]  PATIENT: DAZAH LOSIER DOB: November 10, 1956   REASON FOR VISIT: Follow-up for seizure disorder,  History of headaches , pseudotumor cerebri and obstructive sleep apnea HISTORY FROM: Patient    HISTORY OF PRESENT ILLNESS:UPDATE  06/07/2018CM  Ms. Hector,  61 year old female returns for follow-up history of generalized seizure disorder. Her last seizure occurred in August 2015.  She is currently on Keppra 500 twice daily without side effects. EEG in the past consistent with generalized seizure disorder.  Her pseudotumor cerebri and headaches are stable.  She just saw her ophthalmologist last month she claims.  She denies any blurred vision double vision. She had previously been on Diamox but had side effects. She also has obstructive sleep apnea and is using her CPAP regularly. She denies any daytime sleepiness or excessive fatigue. She has lost about 20 pounds in the last year and was congratulated for that.  She has had several ER admissions in the last yea or chest pain and has been told she has GERD.She returns for reevaluation  HISTORY 05/29/16 Dr. Estill Bakes is a 61 year old woman who has a generalized tonic-clonic seizure last year and recently diagnosed with pseudotumor cerebri (started on Diamox) with headaches.   She also has obstructive sleep apnea.  Seizure: She denies any seizures since the last visit.   In August 2015, she had generalized tonic-clonic seizure activity that was witnessed by her husband. She has had no prior seizures and none since.   She continues is on Keppra.  An MRI of the brain at that time was essentially normal. An EEG was performed and showed 2 runs of generalized spike wave activity, consistent with a generalized seizure disorder. She is unaware of any family history of seizures.    She has not had head trauma in the past. She had a normal childhood development. She has not had any meningitis or encephalitis.  Pseudotumor  cerebri/headache and neck pain:   Due to headaches and papilledema, she underwent a LP.    CSF pressure was elevated At 26.5 cm..   She had a lot of bladder issues on Diamox and stopped.   Headaches are mostly occipital.  However, headaches never returned to the severity or frequency that they were before the lumbar puncture. The HA occurs 1 -2 times a week.  Ibuprofen often knocks the headache out, especially if she lays down.   Her blurry vision is better.    A recent eye exam reportedly just showed cataract.   Lightheadedness with standing up is also better.    She reports that her ophthalmologist told her that the optic nerve changes were due to cataracts.  Obstructive sleep apnea: About 4 or 5 years ago she was diagnosed with OSA with an overnight sleep study. She was started on CPAP +6 cm in the past.    However, she had trouble tolerating the mask and stopped.   She is claustophobic so seldom made it through the night with CPAP.    She reports some fatigue and daytime sleepiness.    REVIEW OF SYSTEMS: Full 14 system review of systems performed and notable only for those listed, all others are neg:  Constitutional: neg  Cardiovascular: occasional chest pain Ear/Nose/Throat: neg  Skin: neg Eyes: neg Respiratory: neg Gastroitestinal: neg  Hematology/Lymphatic: neg  Endocrine: neg Musculoskeletal:neg Allergy/Immunology: neg Neurological:  Seizure disorder Psychiatric: neg Sleep :  Obstructive sleep apnea with CPAP   ALLERGIES: Allergies  Allergen Reactions  . Aspirin Nausea And Vomiting  .  Codeine Rash    HOME MEDICATIONS: Outpatient Medications Prior to Visit  Medication Sig Dispense Refill  . albuterol (PROVENTIL HFA;VENTOLIN HFA) 108 (90 Base) MCG/ACT inhaler Inhale 1-2 puffs into the lungs every 6 (six) hours as needed for wheezing or shortness of breath.    Marland Kitchen atorvastatin (LIPITOR) 40 MG tablet Take 40 mg by mouth at bedtime.     Marland Kitchen ibuprofen (ADVIL,MOTRIN) 200 MG tablet Take  400 mg by mouth as needed.    Marland Kitchen imipramine (TOFRANIL) 50 MG tablet TAKE (1) TABLET BY MOUTH AT BEDTIME. 30 tablet 12  . levETIRAcetam (KEPPRA) 500 MG tablet Take 1 tablet (500 mg total) by mouth 2 (two) times daily. 60 tablet 11  . levothyroxine (SYNTHROID, LEVOTHROID) 100 MCG tablet Take 100 mcg by mouth daily.    . metFORMIN (GLUCOPHAGE) 500 MG tablet Take 500 mg by mouth 2 (two) times daily with a meal.     . metoprolol (LOPRESSOR) 50 MG tablet Take 50 mg by mouth 2 (two) times daily.      Marland Kitchen nystatin cream (MYCOSTATIN)     . nystatin-triamcinolone (MYCOLOG II) cream Apply 1 application topically 2 (two) times daily. 30 g 2  . oxybutynin (DITROPAN) 5 MG tablet Take 2 in am, 1 at mid day and 1 in pm 120 tablet 12  . clindamycin (CLEOCIN) 300 MG capsule     . pantoprazole (PROTONIX) 20 MG tablet Take 1 tablet (20 mg total) by mouth daily. 30 tablet 0  . triamcinolone cream (KENALOG) 0.1 %      No facility-administered medications prior to visit.     PAST MEDICAL HISTORY: Past Medical History:  Diagnosis Date  . Asthma   . Chronic back pain   . Chronic leg pain    Bilateral  . Chronic pelvic pain in female   . Essential hypertension   . Fibromyalgia   . H/O cardiac catheterization    No significant CAD (only 20% LAD) 03/02/13 with normal LV function.  Marland Kitchen Headache   . High cholesterol   . Hypothyroidism   . Mental disorder   . OAB (overactive bladder) 05/21/2013  . Obstructive sleep apnea    Noncompliant with CPAP due to claustophobia.  . Panic attacks   . Rectocele 05/21/2013  . Seizures (HCC)    Had 1 with negative W/U 08/2014  . Type 2 diabetes mellitus (HCC)     PAST SURGICAL HISTORY: Past Surgical History:  Procedure Laterality Date  . ABDOMINAL HYSTERECTOMY    . ANKLE SURGERY    . BACK SURGERY    . CARPAL TUNNEL RELEASE    . knee replacements     bilateral  . LEFT HEART CATHETERIZATION WITH CORONARY ANGIOGRAM N/A 03/02/2013   Procedure: LEFT HEART CATHETERIZATION WITH  CORONARY ANGIOGRAM;  Surgeon: Peter M Swaziland, MD;  Location: [facility] CATH LAB;  Service: Cardiovascular;  Laterality: N/A;  . TONSILLECTOMY    . TUBAL LIGATION      FAMILY HISTORY: Family History  Problem Relation Age of Onset  . Heart attack Mother   . Diabetes Mother   . Hypertension Mother   . Sick sinus syndrome Brother        Pacemaker  . Congenital heart disease Brother   . Stroke Brother   . Coronary artery disease Brother   . Cancer Maternal Aunt        breast, liver,lung  . Cancer Cousin        leukemia    SOCIAL HISTORY: Social History  Social History  . Marital status: Married    Spouse name: N/A  . Number of children: N/A  . Years of education: N/A   Occupational History  . disabled    Social History Main Topics  . Smoking status: Former Smoker    Packs/day: 1.00    Years: 2.00    Types: Cigarettes    Quit date: 12/13/1974  . Smokeless tobacco: Never Used  . Alcohol use No  . Drug use: No  . Sexual activity: Not Currently    Birth control/ protection: Surgical     Comment: hyst   Other Topics Concern  . Not on file   Social History Narrative  . No narrative on file     PHYSICAL EXAM  Vitals:   05/19/17 1306  BP: 112/74  Pulse: (!) 50  Weight: 253 lb 9.6 oz (115 kg)  Height: 5\' 4"  (1.626 m)   Body mass index is 43.53 kg/m.  Generalized: Well developed, in no acute distress  Head: normocephalic and atraumatic,. Oropharynx benign  Neck: Supple, no carotid bruits  Cardiac: Regular rate rhythm, no murmur  Musculoskeletal: No deformity   Neurological examination   Mentation: Alert oriented to time, place, history taking. Attention span and concentration appropriate. Recent and remote memory intact.  Follows all commands speech and language fluent.   Cranial nerve II-XII: Fundoscopic exam reveals sharp disc margins.Pupils were equal round reactive to light extraocular movements were full, visual field were full on confrontational test.  Facial sensation and strength were normal. hearing was intact to finger rubbing bilaterally. Uvula tongue midline. head turning and shoulder shrug were normal and symmetric.Tongue protrusion into cheek strength was normal. Motor: normal bulk and tone, full strength in the BUE, BLE, fine finger movements normal, no pronator drift. No focal weakness Sensory: normal and symmetric to light touch, pinprick,  decreased Vibration  In toes Coordination: finger-nose-finger, heel-to-shin bilaterally, no dysmetria Reflexes:  Symmetric upper and lower, plantar responses were flexor bilaterally. Gait and Station: Rising up from seated position without assistance, normal stance,  moderate stride, good arm swing, smooth turning, able to perform tiptoe, and heel walking without difficulty. Tandem gait is steady  DIAGNOSTIC DATA (LABS, IMAGING, TESTING) - I reviewed patient records, labs, notes, testing and imaging myself where available.  Lab Results  Component Value Date   WBC 5.5 05/16/2017   HGB 13.8 05/16/2017   HCT 41.4 05/16/2017   MCV 88.7 05/16/2017   PLT 188 05/16/2017      Component Value Date/Time   NA 140 05/16/2017 0801   K 3.8 05/16/2017 0801   CL 106 05/16/2017 0801   CO2 24 05/16/2017 0801   GLUCOSE 101 (H) 05/16/2017 0801   BUN 21 (H) 05/16/2017 0801   CREATININE 0.93 05/16/2017 0801   CALCIUM 9.2 05/16/2017 0801   PROT 6.9 05/16/2017 0852   ALBUMIN 3.9 05/16/2017 0852   AST 28 05/16/2017 0852   ALT 22 05/16/2017 0852   ALKPHOS 91 05/16/2017 0852   BILITOT 0.7 05/16/2017 0852   GFRNONAA >60 05/16/2017 0801   GFRAA >60 05/16/2017 0801     ASSESSMENT AND PLAN  61 y.o. year old female  has a past medical history of Asthma; Chronic back pain; Chronic leg pain;  Essential hypertension; Fibromyalgia; H/O cardiac catheterization; Headache; High cholesterol; Hypothyroidism; Mental disorder; OAB (overactive bladder) (05/21/2013); Obstructive  Sleep apnea; Seizures (HCC); and Type 2  diabetes mellitus (HCC). here  To follow up for   1.Occipital headache  2.Obstructive sleep apnea  3. Seizure disorder   PLAN: Continue  Keppra 500 mg po bid.   Will refill  call or any seizure activity Continue Advil when necessary headache. If  headaches worsen contact us Congratulated on weight  Loss Continue CPAP as directed Continue routine follow-up with ophthalmology Follow-up yearly and when necessary Nilda Riggs, [facility], [facility], APRN  [facility] [address] [place], [place] [address] [phone]

## 2017-05-19 NOTE — Patient Instructions (Signed)
Continue  Keppra 500 mg po bid.   Will refill Continue Advil when necessary headache. His headaches worsen contact us Congratulated on weight  Loss Continue CPAP as directed Continue routine follow-up with ophthalmology Follow-up yearly and when necessary

## 2017-05-19 NOTE — Progress Notes (Signed)
I have read the note, and I agree with the clinical assessment and plan.  Rudene Poulsen A. Rawleigh Rode, MD, PhD, FAAN Certified in Neurology, Clinical Neurophysiology, Sleep Medicine, Pain Medicine and Neuroimaging  [facility] [address], [address] [place], [place] [address] [phone]  

## 2017-06-02 ENCOUNTER — Other Ambulatory Visit: Payer: Self-pay

## 2017-06-02 NOTE — Patient Outreach (Signed)
[facility]) Care Management  06/02/2017  [name] 12/22/1955 [id number]   Medication Adherence call to [name]  [name] is showing past due on one of her medication metformin 500 mg patient said she is getting from mail order and waiting to received patient is also  getting a 90 days supply .    Lillia Abed CPhT Pharmacy Technician [facility] Care Management Direct Dial [phone]  Fax [phone] [email or web address]

## 2017-06-30 DIAGNOSIS — E119 Type 2 diabetes mellitus without complications: Secondary | ICD-10-CM | POA: Diagnosis not present

## 2017-06-30 DIAGNOSIS — I10 Essential (primary) hypertension: Secondary | ICD-10-CM | POA: Diagnosis not present

## 2017-06-30 DIAGNOSIS — Z1159 Encounter for screening for other viral diseases: Secondary | ICD-10-CM | POA: Diagnosis not present

## 2017-06-30 DIAGNOSIS — E039 Hypothyroidism, unspecified: Secondary | ICD-10-CM | POA: Diagnosis not present

## 2017-07-04 ENCOUNTER — Telehealth (INDEPENDENT_AMBULATORY_CARE_PROVIDER_SITE_OTHER): Payer: Self-pay | Admitting: *Deleted

## 2017-07-04 DIAGNOSIS — I10 Essential (primary) hypertension: Secondary | ICD-10-CM | POA: Diagnosis not present

## 2017-07-04 DIAGNOSIS — E039 Hypothyroidism, unspecified: Secondary | ICD-10-CM | POA: Diagnosis not present

## 2017-07-04 DIAGNOSIS — R945 Abnormal results of liver function studies: Secondary | ICD-10-CM | POA: Diagnosis not present

## 2017-07-04 DIAGNOSIS — E782 Mixed hyperlipidemia: Secondary | ICD-10-CM | POA: Diagnosis not present

## 2017-07-04 DIAGNOSIS — E119 Type 2 diabetes mellitus without complications: Secondary | ICD-10-CM | POA: Diagnosis not present

## 2017-07-04 NOTE — Telephone Encounter (Signed)
Received a call from [facility] Dentist wanting to know if we can send something to them stating that pt does not need to be premedicated for any dental work. Last surgery MY done on pt was 2008 for knee replacement.   Please fax noted to [phone]

## 2017-07-07 NOTE — Telephone Encounter (Signed)
Faxed

## 2017-08-02 ENCOUNTER — Other Ambulatory Visit: Payer: Self-pay | Admitting: Obstetrics & Gynecology

## 2017-08-04 DIAGNOSIS — R569 Unspecified convulsions: Secondary | ICD-10-CM | POA: Diagnosis not present

## 2017-09-04 ENCOUNTER — Emergency Department (HOSPITAL_COMMUNITY): Payer: Medicare Other

## 2017-09-04 ENCOUNTER — Encounter (HOSPITAL_COMMUNITY): Payer: Self-pay

## 2017-09-04 ENCOUNTER — Observation Stay (HOSPITAL_COMMUNITY)
Admission: EM | Admit: 2017-09-04 | Discharge: 2017-09-05 | Disposition: A | Discharge status: Home / Self Care | Payer: Medicare Other | Attending: Internal Medicine | Admitting: Internal Medicine

## 2017-09-04 DIAGNOSIS — E782 Mixed hyperlipidemia: Secondary | ICD-10-CM

## 2017-09-04 DIAGNOSIS — Z7984 Long term (current) use of oral hypoglycemic drugs: Secondary | ICD-10-CM | POA: Diagnosis not present

## 2017-09-04 DIAGNOSIS — R51 Headache: Secondary | ICD-10-CM | POA: Insufficient documentation

## 2017-09-04 DIAGNOSIS — G40309 Generalized idiopathic epilepsy and epileptic syndromes, not intractable, without status epilepticus: Secondary | ICD-10-CM | POA: Diagnosis not present

## 2017-09-04 DIAGNOSIS — E785 Hyperlipidemia, unspecified: Secondary | ICD-10-CM | POA: Diagnosis present

## 2017-09-04 DIAGNOSIS — R079 Chest pain, unspecified: Secondary | ICD-10-CM | POA: Diagnosis present

## 2017-09-04 DIAGNOSIS — E119 Type 2 diabetes mellitus without complications: Secondary | ICD-10-CM

## 2017-09-04 DIAGNOSIS — I10 Essential (primary) hypertension: Secondary | ICD-10-CM | POA: Diagnosis present

## 2017-09-04 DIAGNOSIS — R072 Precordial pain: Secondary | ICD-10-CM | POA: Diagnosis not present

## 2017-09-04 DIAGNOSIS — Z87891 Personal history of nicotine dependence: Secondary | ICD-10-CM | POA: Diagnosis not present

## 2017-09-04 DIAGNOSIS — G4733 Obstructive sleep apnea (adult) (pediatric): Secondary | ICD-10-CM | POA: Diagnosis not present

## 2017-09-04 DIAGNOSIS — R0602 Shortness of breath: Secondary | ICD-10-CM | POA: Diagnosis not present

## 2017-09-04 DIAGNOSIS — E039 Hypothyroidism, unspecified: Secondary | ICD-10-CM | POA: Diagnosis not present

## 2017-09-04 DIAGNOSIS — Z79899 Other long term (current) drug therapy: Secondary | ICD-10-CM | POA: Insufficient documentation

## 2017-09-04 LAB — CBC WITH DIFFERENTIAL/PLATELET
BASOS PCT: 1 %
Basophils Absolute: 0 10*3/uL (ref 0.0–0.1)
EOS ABS: 0.2 10*3/uL (ref 0.0–0.7)
EOS PCT: 2 %
HCT: 43 % (ref 36.0–46.0)
HEMOGLOBIN: 14.5 g/dL (ref 12.0–15.0)
LYMPHS ABS: 2 10*3/uL (ref 0.7–4.0)
Lymphocytes Relative: 32 %
MCH: 30.1 pg (ref 26.0–34.0)
MCHC: 33.7 g/dL (ref 30.0–36.0)
MCV: 89.2 fL (ref 78.0–100.0)
MONOS PCT: 8 %
Monocytes Absolute: 0.5 10*3/uL (ref 0.1–1.0)
NEUTROS PCT: 57 %
Neutro Abs: 3.5 10*3/uL (ref 1.7–7.7)
Platelets: 214 10*3/uL (ref 150–400)
RBC: 4.82 MIL/uL (ref 3.87–5.11)
RDW: 12.4 % (ref 11.5–15.5)
WBC: 6.2 10*3/uL (ref 4.0–10.5)

## 2017-09-04 LAB — COMPREHENSIVE METABOLIC PANEL
ALK PHOS: 95 U/L (ref 38–126)
ALT: 25 U/L (ref 14–54)
AST: 31 U/L (ref 15–41)
Albumin: 4.4 g/dL (ref 3.5–5.0)
Anion gap: 8 (ref 5–15)
BUN: 11 mg/dL (ref 6–20)
CALCIUM: 9.1 mg/dL (ref 8.9–10.3)
CHLORIDE: 105 mmol/L (ref 101–111)
CO2: 27 mmol/L (ref 22–32)
CREATININE: 0.76 mg/dL (ref 0.44–1.00)
Glucose, Bld: 86 mg/dL (ref 65–99)
Potassium: 3.8 mmol/L (ref 3.5–5.1)
Sodium: 140 mmol/L (ref 135–145)
Total Bilirubin: 0.6 mg/dL (ref 0.3–1.2)
Total Protein: 7.8 g/dL (ref 6.5–8.1)

## 2017-09-04 LAB — GLUCOSE, CAPILLARY: Glucose-Capillary: 88 mg/dL (ref 65–99)

## 2017-09-04 LAB — TROPONIN I: Troponin I: <0.03 ng/mL (ref <0.03)

## 2017-09-04 MED ORDER — HEPARIN (PORCINE) IN NACL 100-0.45 UNIT/ML-% IJ SOLN
14.0000 [IU]/kg/h | Freq: Once | INTRAMUSCULAR | Status: DC
Start: 2017-09-04 — End: 2017-09-04

## 2017-09-04 MED ORDER — GI COCKTAIL ~~LOC~~: 30.0000 mL | Freq: Four times a day (QID) | ORAL | Status: DC | PRN

## 2017-09-04 MED ORDER — HEPARIN (PORCINE) IN NACL 100-0.45 UNIT/ML-% IJ SOLN: 1400.0000 [IU]/h | INTRAMUSCULAR | Status: DC

## 2017-09-04 MED ORDER — HEPARIN SODIUM (PORCINE) 5000 UNIT/ML IJ SOLN
4000.0000 [IU] | Freq: Once | INTRAMUSCULAR | Status: DC
Start: 2017-09-04 — End: 2017-09-04

## 2017-09-04 MED ORDER — ENOXAPARIN SODIUM 40 MG/0.4ML ~~LOC~~ SOLN
40.0000 mg | SUBCUTANEOUS | Status: DC
  Administered 2017-09-04: 40 mg via SUBCUTANEOUS
  Filled 2017-09-04: qty 0.4

## 2017-09-04 MED ORDER — ATORVASTATIN CALCIUM 40 MG PO TABS
40.0000 mg | ORAL_TABLET | Freq: Every day | ORAL | Status: DC
  Administered 2017-09-04: 40 mg via ORAL
  Filled 2017-09-04: qty 1

## 2017-09-04 MED ORDER — LEVOTHYROXINE SODIUM 50 MCG PO TABS
100.0000 ug | ORAL_TABLET | Freq: Every day | ORAL | Status: DC
  Administered 2017-09-05: 100 ug via ORAL
  Filled 2017-09-04: qty 2

## 2017-09-04 MED ORDER — IMIPRAMINE HCL 50 MG PO TABS
50.0000 mg | ORAL_TABLET | Freq: Every day | ORAL | Status: DC
  Filled 2017-09-04: qty 1

## 2017-09-04 MED ORDER — HEPARIN BOLUS VIA INFUSION: 4000.0000 [IU] | Freq: Once | INTRAVENOUS | Status: DC

## 2017-09-04 MED ORDER — HYDRALAZINE HCL 20 MG/ML IJ SOLN: 5.0000 mg | INTRAMUSCULAR | Status: DC | PRN

## 2017-09-04 MED ORDER — ACETAMINOPHEN 325 MG PO TABS
650.0000 mg | ORAL_TABLET | ORAL | Status: DC | PRN
  Administered 2017-09-05: 650 mg via ORAL
  Filled 2017-09-04: qty 2

## 2017-09-04 MED ORDER — OXYBUTYNIN CHLORIDE 5 MG PO TABS
5.0000 mg | ORAL_TABLET | ORAL | Status: DC
  Administered 2017-09-04 – 2017-09-05 (×2): 5 mg via ORAL
  Filled 2017-09-04 (×2): qty 1

## 2017-09-04 MED ORDER — METOPROLOL TARTRATE 50 MG PO TABS
50.0000 mg | ORAL_TABLET | Freq: Two times a day (BID) | ORAL | Status: DC
  Filled 2017-09-04 (×2): qty 1

## 2017-09-04 MED ORDER — INSULIN ASPART 100 UNIT/ML ~~LOC~~ SOLN: 0.0000 [IU] | Freq: Three times a day (TID) | SUBCUTANEOUS | Status: DC

## 2017-09-04 MED ORDER — ONDANSETRON HCL 4 MG/2ML IJ SOLN
4.0000 mg | Freq: Once | INTRAMUSCULAR | Status: AC
  Administered 2017-09-04: 4 mg via INTRAVENOUS
  Filled 2017-09-04: qty 2

## 2017-09-04 MED ORDER — MORPHINE SULFATE (PF) 2 MG/ML IV SOLN
2.0000 mg | INTRAVENOUS | Status: DC | PRN
  Administered 2017-09-04: 2 mg via INTRAVENOUS
  Filled 2017-09-04: qty 1

## 2017-09-04 MED ORDER — HYDROMORPHONE HCL 1 MG/ML IJ SOLN
0.5000 mg | Freq: Once | INTRAMUSCULAR | Status: AC
  Administered 2017-09-04: 0.5 mg via INTRAVENOUS
  Filled 2017-09-04: qty 1

## 2017-09-04 MED ORDER — ONDANSETRON HCL 4 MG/2ML IJ SOLN: 4.0000 mg | Freq: Four times a day (QID) | INTRAMUSCULAR | Status: DC | PRN

## 2017-09-04 MED ORDER — NITROGLYCERIN 0.4 MG SL SUBL
0.4000 mg | SUBLINGUAL_TABLET | SUBLINGUAL | Status: DC | PRN
  Administered 2017-09-04 (×2): 0.4 mg via SUBLINGUAL
  Filled 2017-09-04 (×2): qty 1

## 2017-09-04 MED ORDER — OXYBUTYNIN CHLORIDE 5 MG PO TABS
10.0000 mg | ORAL_TABLET | Freq: Every day | ORAL | Status: DC
  Administered 2017-09-05: 10 mg via ORAL
  Filled 2017-09-04: qty 2

## 2017-09-04 MED ORDER — LEVETIRACETAM 500 MG PO TABS
500.0000 mg | ORAL_TABLET | Freq: Two times a day (BID) | ORAL | Status: DC
  Administered 2017-09-04 – 2017-09-05 (×2): 500 mg via ORAL
  Filled 2017-09-04 (×2): qty 1

## 2017-09-04 NOTE — H&P (Signed)
History and Physical    [name] JJO:[id number] DOB: 06-07-1956 DOA: 09/04/2017  PCP: Benita Stabile, MD Consultants:  Victorino Dike at [facility] OB[facility]N; Pearlean Brownie - neurology Patient coming from:  Home - lives with husband, granddaughter, her boyfriend, their daughter, and a foster child; NOK: Husband, [phone]  Chief Complaint: chest pain  HPI: [name] is a 61 y.o. female with medical history significant of DM; seizure disorder; OSA not on CPAP; hypothyroidism; HLD; HTN; and chronic pain presenting with chest pain.  For the last few days, she has been having bad headaches from the back of the neck and right shoulder into her head.  Today with numbness in the right side of her head.  She thought about coming to ER after church but decided to go home to rest.  She was lying down and developed acute onset of chest pain.  Left chest with radiation into the shoulder.  Currently 6/10, was previously 10/10.  Better with Dilaudid.  Non-exertional.  She has not yet received NTG.  Headache and neck pain is also still present but all pain is dull right now from Dilaudid.  Last stress was years ago.  Last cath was in 2014.  She has never required cardiac intervention.  Strong FH of CAD and CVA.  Mother died of MI at 41.   ED Course:  Pain decreased to 1/10 with NTG and has not recurred.  Pain improved with Dilaudid.  Review of Systems: As per HPI; otherwise review of systems reviewed and negative.   Ambulatory Status:  Ambulates without assistance  Past Medical History:  Diagnosis Date  . Asthma   . Chronic back pain   . Chronic leg pain    Bilateral  . Chronic pelvic pain in female   . Essential hypertension   . Fibromyalgia   . H/O cardiac catheterization    No significant CAD (only 20% LAD) 03/02/13 with normal LV function.  Marland Kitchen Headache   . High cholesterol   . Hypothyroidism   . Mental disorder   . OAB (overactive bladder) 05/21/2013  . Obstructive sleep apnea    Noncompliant  with CPAP due to claustophobia.  . Panic attacks   . Rectocele 05/21/2013  . Seizures (HCC)    1 severe seizure 2015; has had "small ones" since including 9/18  . Type 2 diabetes mellitus (HCC)     Past Surgical History:  Procedure Laterality Date  . ABDOMINAL HYSTERECTOMY    . ANKLE SURGERY    . BACK SURGERY    . CARPAL TUNNEL RELEASE    . knee replacements     bilateral  . LEFT HEART CATHETERIZATION WITH CORONARY ANGIOGRAM N/A 03/02/2013   Procedure: LEFT HEART CATHETERIZATION WITH CORONARY ANGIOGRAM;  Surgeon: Peter M Swaziland, MD;  Location: [facility] CATH LAB;  Service: Cardiovascular;  Laterality: N/A;  . TONSILLECTOMY    . TUBAL LIGATION      Social History   Social History  . Marital status: Married    Spouse name: N/A  . Number of children: N/A  . Years of education: N/A   Occupational History  . disabled    Social History Main Topics  . Smoking status: Former Smoker    Packs/day: 1.00    Years: 2.00    Types: Cigarettes    Quit date: 12/13/1974  . Smokeless tobacco: Never Used  . Alcohol use No  . Drug use: No  . Sexual activity: Not Currently    Birth control/ protection: Surgical  Comment: hyst   Other Topics Concern  . Not on file   Social History Narrative  . No narrative on file    Allergies  Allergen Reactions  . Aspirin Nausea And Vomiting  . Codeine Rash  . Latex Rash    Family History  Problem Relation Age of Onset  . Heart attack Mother   . Diabetes Mother   . Hypertension Mother   . Sick sinus syndrome Brother        Pacemaker  . Congenital heart disease Brother   . Stroke Brother   . Coronary artery disease Brother   . Cancer Maternal Aunt        breast, liver,lung  . Cancer Cousin        leukemia    Prior to Admission medications   Medication Sig Start Date End Date Taking? Authorizing Provider  albuterol (PROVENTIL HFA;VENTOLIN HFA) 108 (90 Base) MCG/ACT inhaler Inhale 1-2 puffs into the lungs every 6 (six) hours as needed for  wheezing or shortness of breath.   Yes [provider]  atorvastatin (LIPITOR) 40 MG tablet Take 40 mg by mouth at bedtime.  10/15/14  Yes [provider]  Fluocinolone Acetonide Scalp 0.01 % OIL Apply 1 application topically daily as needed.   Yes [provider]  ibuprofen (ADVIL,MOTRIN) 200 MG tablet Take 400 mg by mouth as needed.   Yes [provider]  imipramine (TOFRANIL) 50 MG tablet TAKE (1) TABLET BY MOUTH AT BEDTIME. 05/04/17  Yes Adline Potter, NP  levETIRAcetam (KEPPRA) 500 MG tablet Take 1 tablet (500 mg total) by mouth 2 (two) times daily. 05/19/17  Yes Nilda Riggs, NP  levothyroxine (SYNTHROID, LEVOTHROID) 100 MCG tablet Take 100 mcg by mouth daily. 12/31/16  Yes [provider]  metFORMIN (GLUCOPHAGE) 500 MG tablet Take 500 mg by mouth 2 (two) times daily with a meal.  03/26/14  Yes [provider]  metoprolol (LOPRESSOR) 50 MG tablet Take 50 mg by mouth 2 (two) times daily.     Yes [provider]  nystatin cream (MYCOSTATIN) APPLY TO AFFECTED AREA(S) TWICE DAILY 08/02/17  Yes Lazaro Arms, MD  nystatin-triamcinolone (MYCOLOG II) cream Apply 1 application topically 2 (two) times daily. 05/04/17  Yes Adline Potter, NP  oxybutynin (DITROPAN) 5 MG tablet Take 2 in am, 1 at mid day and 1 in pm 05/04/17  Yes Cyril Mourning A, NP  triamcinolone cream (KENALOG) 0.1 % APPLY TO AFFECTED AREA(S) TWICE DAILY 08/02/17  Yes Lazaro Arms, MD    Physical Exam: Vitals:   09/04/17 1900 09/04/17 1930 09/04/17 2000 09/04/17 2015  BP: 101/66 107/72 112/71   Pulse: (!) 53 (!) 54  (!) 57  Resp:  (!) Temp:      TempSrc:      SpO2: 100% 90%  95%  Weight:      Height:         General: Appears calm and comfortable and is NAD Eyes:  PERRL, EOMI, normal lids, iris ENT:  grossly normal hearing, lips & tongue, mmm; appropriate dentition Neck:  no LAD, masses or thyromegaly; no carotid  bruits Cardiovascular:  RRR, no m/r/g. No LE edema.  Respiratory:   CTA bilaterally with no wheezes/rales/rhonchi.  Normal respiratory effort. Abdomen:  soft, NT, ND, NABS Skin:  no rash or induration seen on limited exam Musculoskeletal:  grossly normal tone BUE/BLE, good ROM, no bony abnormality Lower extremity:  No LE edema.  Limited  foot exam with no ulcerations.  2+ distal pulses. Psychiatric:  grossly normal mood and affect, speech fluent and appropriate, AOx3 Neurologic:  CN 2-12 grossly intact, moves all extremities in coordinated fashion, sensation intact    Radiological Exams on Admission: Dg Chest 2 View  Result Date: 09/04/2017 CLINICAL DATA:  Chest pain.  History of hypertension, diabetes. EXAM: CHEST  2 VIEW COMPARISON:  Chest radiograph May 16, 2017 FINDINGS: Cardiomediastinal silhouette is normal. No pleural effusions or focal consolidations. Trachea projects midline and there is no pneumothorax. Soft tissue planes and included osseous structures are non-suspicious. Mild degenerative change of the thoracic spine. IMPRESSION: Stable examination:  No acute cardiopulmonary process. Electronically Signed   By: Awilda Metro M.D.   On: 09/04/2017 16:58   Ct Head Wo Contrast  Result Date: 09/04/2017 CLINICAL DATA:  Headache for several days, LEFT chest pain and shortness of breath. EXAM: CT HEAD WITHOUT CONTRAST TECHNIQUE: Contiguous axial images were obtained from the base of the skull through the vertex without intravenous contrast. COMPARISON:  MRI of the head August 22, 2015 and CT HEAD August 22, 2015 FINDINGS: BRAIN: No intraparenchymal hemorrhage, mass effect nor midline shift. The ventricles and sulci are normal for age. Minimal supratentorial white matter hypodensities within normal range for patient's age, though non-specific are most compatible with chronic small vessel ischemic disease. No acute large vascular territory infarcts. No abnormal extra-axial fluid  collections. Basal cisterns are patent. VASCULAR: Mild calcific atherosclerosis of the carotid siphons. SKULL: No skull fracture. No significant scalp soft tissue swelling. SINUSES/ORBITS: The mastoid air-cells and included paranasal sinuses are well-aerated.The included ocular globes and orbital contents are non-suspicious. Status post bilateral ocular lens implants. OTHER: None. IMPRESSION: Negative noncontrast CT HEAD for age. Electronically Signed   By: Awilda Metro M.D.   On: 09/04/2017 16:55    EKG: Independently reviewed.  NSR with rate 60; low voltage with no evidence of acute ischemia; NSCSLT   Labs on Admission: I have personally reviewed the available labs and imaging studies at the time of the admission.  Pertinent labs:   Troponin <0.03   Assessment/Plan Principal Problem:   Chest pain Active Problems:   Hypertension   Diabetes (HCC)   Hyperlipidemia   Epilepsy, generalized, convulsive (HCC)   Obstructive sleep apnea   Hypothyroidism   Chest pain -Patient with left-sided chest pain that is at least somewhat positional and is non-exertional, persistent but improved after Dilaudid and NTG. -1/3 typical symptoms suggestive of noncardiac chest pain.  -CXR unremarkable.   -Initial cardiac troponin negative.  -EKG not indicative of acute ischemia.   -GRACE score is 108; which predicts an in-hospital death rate of 1%.  -Will plan to place in observation status on telemetry to rule out ACS by overnight observation.  -cycle troponin q6h x 3 and repeat EKG in AM -Start ASA 81 mg daily -morphine given -Risk factor stratification with HgbA1c and FLP; will also check TSH  -Cardiology consultation in AM - NPO for possible stress test  -Will plan to start Heparin drip if enzymes are positive and/or chest pain recurs (ER plan was for heparin due to persistent pain, but the pain did basically resolve with NTG and so will hold heparin for now)  HTN -Takes lopressor monotherapy  at home -Patient with appropriate control while in the ER  HLD -Continue Lipitor -Lipids were checked in 8/15 (TC 193, HDL 37, LDL 111, TG 226) -Will repeat in AM  DM -Hold Glucophage -Check A1c -Cover with moderate-scale  SSI  Epilepsy -Patient takes Keppra  -She reports a recent seizure -She did not appear to be aware of the no-driving for 6 months rule and is continuing to drive -She is also taking Imipramine, which can increase the risk for seizures; will hold  OSA -Does not wear CPAP  Hypothyroidism -Will check TSH -Continue Synthroid at current dose for now  DVT prophylaxis: Lovenox  Code Status: Full - confirmed with patient/family Family Communication: Husband was present throughout evaluation Disposition Plan:  Home once clinically improved Consults called: Cardiology to see in AM  Admission status: It is my clinical opinion that referral for OBSERVATION is reasonable and necessary in this patient based on the above information provided. The aforementioned taken together are felt to place the patient at high risk for further clinical deterioration. However it is anticipated that the patient may be medically stable for discharge from the hospital within 24 to 48 hours.    Jonah Blue MD [facility]  If note is complete, please contact covering daytime or nighttime physician. [email or web address] Password [email or web address]  09/04/2017, 9:00 PM

## 2017-09-04 NOTE — ED Notes (Addendum)
Dr Ophelia Charter notified of chest pain assessment Pt reports pain is dull 1. Per Dr Ophelia Charter  do not start heparin unless chest pain  increases

## 2017-09-04 NOTE — ED Provider Notes (Signed)
[facility]-EMERGENCY DEPT Provider Note   CSN: [id number] Arrival date & time: 09/04/17  1512     History   Chief Complaint Chief Complaint  Patient presents with  . Chest Pain  . Shortness of Breath    HPI [name] is a 61 y.o. female.  Patient presents with acute onset of left-sided chest pain associated with some shortness of breath started about 1520 minutes prior to arrival. Patient's had the complaint of a posterior headache and some neck pain or the past several days. Patient had cardiac catheterization in 2014. Patient does not have any stents. Patient is allergic to aspirin. Chest pain nonradiating she states is 10 out of 10. It is made worse by moving her left arm.      Past Medical History:  Diagnosis Date  . Asthma   . Chronic back pain   . Chronic leg pain    Bilateral  . Chronic pelvic pain in female   . Essential hypertension   . Fibromyalgia   . H/O cardiac catheterization    No significant CAD (only 20% LAD) 03/02/13 with normal LV function.  Marland Kitchen Headache   . High cholesterol   . Hypothyroidism   . Mental disorder   . OAB (overactive bladder) 05/21/2013  . Obstructive sleep apnea    Noncompliant with CPAP due to claustophobia.  . Panic attacks   . Rectocele 05/21/2013  . Seizures (HCC)    Had 1 with negative W/U 08/2014  . Type 2 diabetes mellitus [facility])     Patient Active Problem List   Diagnosis Date Noted  . Superficial fungus infection of skin 05/04/2017  . Bruising 10/14/2016  . Chest pain 10/04/2016  . Hypothyroidism 10/04/2016  . Idiopathic intracranial hypertension 11/19/2015  . Papilledema 09/08/2015  . Left facial numbness 08/22/2015  . Epilepsy, generalized, convulsive (HCC) 06/05/2015  . Obstructive sleep apnea 06/05/2015  . Headache, occipital 06/05/2015  . Neck pain 06/05/2015  . Occipital neuralgia of right side 06/05/2015  . Seizure (HCC) 07/21/2014  . Rectocele 05/21/2013  . OAB (overactive bladder) 05/21/2013  . Difficulty  in walking(719.7) 04/11/2013  . Leg weakness, bilateral 04/11/2013  . Diabetes (HCC) 02/28/2013  . Morbid obesity (HCC) 02/28/2013  . Hyperlipidemia 02/28/2013  . CHRONIC OSTEOMYELITIS ANKLE AND FOOT 07/22/2009  . Hypertension 07/22/2009    Past Surgical History:  Procedure Laterality Date  . ABDOMINAL HYSTERECTOMY    . ANKLE SURGERY    . BACK SURGERY    . CARPAL TUNNEL RELEASE    . knee replacements     bilateral  . LEFT HEART CATHETERIZATION WITH CORONARY ANGIOGRAM N/A 03/02/2013   Procedure: LEFT HEART CATHETERIZATION WITH CORONARY ANGIOGRAM;  Surgeon: Peter M Swaziland, MD;  Location: [facility] CATH LAB;  Service: Cardiovascular;  Laterality: N/A;  . TONSILLECTOMY    . TUBAL LIGATION      OB History    Gravida Para Term Preterm AB Living   SAB TAB Ectopic Multiple Live Births           2       Home Medications    Prior to Admission medications   Medication Sig Start Date End Date Taking? Authorizing Provider  albuterol (PROVENTIL HFA;VENTOLIN HFA) 108 (90 Base) MCG/ACT inhaler Inhale 1-2 puffs into the lungs every 6 (six) hours as needed for wheezing or shortness of breath.   Yes [provider]  atorvastatin (LIPITOR) 40 MG tablet Take 40 mg by  mouth at bedtime.  10/15/14  Yes [provider]  Fluocinolone Acetonide Scalp 0.01 % OIL Apply 1 application topically daily as needed.   Yes [provider]  ibuprofen (ADVIL,MOTRIN) 200 MG tablet Take 400 mg by mouth as needed.   Yes [provider]  imipramine (TOFRANIL) 50 MG tablet TAKE (1) TABLET BY MOUTH AT BEDTIME. 05/04/17  Yes Adline Potter, NP  levETIRAcetam (KEPPRA) 500 MG tablet Take 1 tablet (500 mg total) by mouth 2 (two) times daily. 05/19/17  Yes Nilda Riggs, NP  levothyroxine (SYNTHROID, LEVOTHROID) 100 MCG tablet Take 100 mcg by mouth daily. 12/31/16  Yes [provider]  metFORMIN (GLUCOPHAGE) 500 MG tablet Take 500 mg by mouth 2 (two) times daily  with a meal.  03/26/14  Yes [provider]  metoprolol (LOPRESSOR) 50 MG tablet Take 50 mg by mouth 2 (two) times daily.     Yes [provider]  nystatin cream (MYCOSTATIN) APPLY TO AFFECTED AREA(S) TWICE DAILY 08/02/17  Yes Lazaro Arms, MD  nystatin-triamcinolone (MYCOLOG II) cream Apply 1 application topically 2 (two) times daily. 05/04/17  Yes Adline Potter, NP  oxybutynin (DITROPAN) 5 MG tablet Take 2 in am, 1 at mid day and 1 in pm 05/04/17  Yes Cyril Mourning A, NP  triamcinolone cream (KENALOG) 0.1 % APPLY TO AFFECTED AREA(S) TWICE DAILY 08/02/17  Yes Lazaro Arms, MD    Family History Family History  Problem Relation Age of Onset  . Heart attack Mother   . Diabetes Mother   . Hypertension Mother   . Sick sinus syndrome Brother        Pacemaker  . Congenital heart disease Brother   . Stroke Brother   . Coronary artery disease Brother   . Cancer Maternal Aunt        breast, liver,lung  . Cancer Cousin        leukemia    Social History Social History  Substance Use Topics  . Smoking status: Former Smoker    Packs/day: 1.00    Years: 2.00    Types: Cigarettes    Quit date: 12/13/1974  . Smokeless tobacco: Never Used  . Alcohol use No     Allergies   Aspirin; Codeine; and Latex   Review of Systems Review of Systems  Constitutional: Negative for fever.  HENT: Negative for congestion.   Eyes: Negative for redness.  Respiratory: Positive for shortness of breath.   Cardiovascular: Positive for chest pain.  Gastrointestinal: Negative for abdominal pain and nausea.  Genitourinary: Negative for dysuria.  Musculoskeletal: Positive for neck pain. Negative for back pain.  Skin: Negative for rash.  Neurological: Positive for numbness and headaches.  Hematological: Does not bruise/bleed easily.  Psychiatric/Behavioral: Negative for confusion.     Physical Exam Updated Vital Signs BP 122/83   Pulse (!) 58   Temp 97.6 F (36.4 C) (Oral)    Resp 14   Ht 1.702 m (5\' 7" )   Wt 112.5 kg (248 lb)   SpO2 (!) 89%   BMI 38.84 kg/m   Physical Exam  Constitutional: She is oriented to person, place, and time. She appears well-developed and well-nourished. No distress.  HENT:  Head: Normocephalic and atraumatic.  Mouth/Throat: Oropharynx is clear and moist.  Eyes: Pupils are equal, round, and reactive to light. Conjunctivae and EOM are normal.  Neck: Normal range of motion. Neck supple.  Cardiovascular: Normal rate, regular rhythm and normal heart sounds.   Pulmonary/Chest: Effort  normal and breath sounds normal. She exhibits tenderness.  Abdominal: Soft. Bowel sounds are normal. There is no tenderness.  Musculoskeletal: Normal range of motion.  Neurological: She is alert and oriented to person, place, and time. No cranial nerve deficit or sensory deficit. She exhibits normal muscle tone. Coordination normal.  Skin: Skin is warm.  Nursing note and vitals reviewed.    ED Treatments / Results  Labs (all labs ordered are listed, but only abnormal results are displayed) Labs Reviewed  COMPREHENSIVE METABOLIC PANEL  CBC WITH DIFFERENTIAL/PLATELET  TROPONIN I    EKG  EKG Interpretation  Date/Time:  Sunday September 04 2017 15:17:45 EDT Ventricular Rate:  60 PR Interval:    QRS Duration: 89 QT Interval:  417 QTC Calculation: 417 R Axis:   24 Text Interpretation:  Sinus rhythm Low voltage, extremity and precordial leads No significant change since last tracing Confirmed by Vanetta Mulders [phone]) on 09/04/2017 3:21:38 PM       Radiology Dg Chest 2 View  Result Date: 09/04/2017 CLINICAL DATA:  Chest pain.  History of hypertension, diabetes. EXAM: CHEST  2 VIEW COMPARISON:  Chest radiograph May 16, 2017 FINDINGS: Cardiomediastinal silhouette is normal. No pleural effusions or focal consolidations. Trachea projects midline and there is no pneumothorax. Soft tissue planes and included osseous structures are non-suspicious.  Mild degenerative change of the thoracic spine. IMPRESSION: Stable examination:  No acute cardiopulmonary process. Electronically Signed   By: Awilda Metro M.D.   On: 09/04/2017 16:58   Ct Head Wo Contrast  Result Date: 09/04/2017 CLINICAL DATA:  Headache for several days, LEFT chest pain and shortness of breath. EXAM: CT HEAD WITHOUT CONTRAST TECHNIQUE: Contiguous axial images were obtained from the base of the skull through the vertex without intravenous contrast. COMPARISON:  MRI of the head August 22, 2015 and CT HEAD August 22, 2015 FINDINGS: BRAIN: No intraparenchymal hemorrhage, mass effect nor midline shift. The ventricles and sulci are normal for age. Minimal supratentorial white matter hypodensities within normal range for patient's age, though non-specific are most compatible with chronic small vessel ischemic disease. No acute large vascular territory infarcts. No abnormal extra-axial fluid collections. Basal cisterns are patent. VASCULAR: Mild calcific atherosclerosis of the carotid siphons. SKULL: No skull fracture. No significant scalp soft tissue swelling. SINUSES/ORBITS: The mastoid air-cells and included paranasal sinuses are well-aerated.The included ocular globes and orbital contents are non-suspicious. Status post bilateral ocular lens implants. OTHER: None. IMPRESSION: Negative noncontrast CT HEAD for age. Electronically Signed   By: Awilda Metro M.D.   On: 09/04/2017 16:55    Procedures Procedures (including critical care time)  CRITICAL CARE Performed by: Vanetta Mulders Total critical care time: 30  minutes Critical care time was exclusive of separately billable procedures and treating other patients. Critical care was necessary to treat or prevent imminent or life-threatening deterioration. Critical care was time spent personally by me on the following activities: development of treatment plan with patient and/or surrogate as well as nursing, discussions with  consultants, evaluation of patient's response to treatment, examination of patient, obtaining history from patient or surrogate, ordering and performing treatments and interventions, ordering and review of laboratory studies, ordering and review of radiographic studies, pulse oximetry and re-evaluation of patient's condition.  .  Medications Ordered in ED Medications  nitroGLYCERIN (NITROSTAT) SL tablet 0.4 mg (not administered)  ondansetron (ZOFRAN) injection 4 mg (4 mg Intravenous Given 09/04/17 1604)  HYDROmorphone (DILAUDID) injection 0.5 mg (0.5 mg Intravenous Given 09/04/17 1604)     Initial Impression /  Assessment and Plan / ED Course  I have reviewed the triage vital signs and the nursing notes.  Pertinent labs & imaging results that were available during my care of the patient were reviewed by me and considered in my medical decision making (see chart for details).     Patient with chest pain that does seem chest wall in nature but she has some risk factors. First troponin negative but symptoms just started right prior to arrival. Will go ahead and start her on heparin. She hasn't allergy to aspirin. Sublingual nitroglycerin provided. Brought pain down to 2 out of 10 along with some hydromorphone. Chest x-ray was negative. Head CT was negative. This was done due to the headache that preceded the chest pain for a few days. Patient currently stable for admission. Patient will require cardiac rule o Criticaledut.  Final Clinical Impressions(s) / ED Diagnoses   Final diagnoses:  Precordial pain    New Prescriptions New Prescriptions   No medications on file     Vanetta Mulders, MD 09/04/17 2033

## 2017-09-04 NOTE — ED Triage Notes (Signed)
Reports of laying down and started having left side chest pain and shortness of breath. Also complains of headache for several days.

## 2017-09-04 NOTE — Progress Notes (Signed)
ANTICOAGULATION CONSULT NOTE - Initial Consult  Pharmacy Consult for heparin Indication: chest pain/ACS  Allergies  Allergen Reactions  . Aspirin Nausea And Vomiting  . Codeine Rash  . Latex Rash    Patient Measurements: Height: 5\' 7"  (170.2 cm) Weight: 248 lb (112.5 kg) IBW/kg (Calculated) : 61.6 Heparin Dosing Weight: 87.6 kg  Vital Signs: Temp: 97.6 F (36.4 C) (09/23 1518) Temp Source: Oral (09/23 1518) BP: 108/66 (09/23 1807) Pulse Rate: 55 (09/23 1807)  Labs:  Recent Labs  09/04/17 1524  HGB 14.5  HCT 43.0  PLT 214  CREATININE 0.76  TROPONINI <0.03    Estimated Creatinine Clearance: 95.6 mL/min (by C-G formula based on SCr of 0.76 mg/dL).   Medical History: Past Medical History:  Diagnosis Date  . Asthma   . Chronic back pain   . Chronic leg pain    Bilateral  . Chronic pelvic pain in female   . Essential hypertension   . Fibromyalgia   . H/O cardiac catheterization    No significant CAD (only 20% LAD) 03/02/13 with normal LV function.  03/04/13 Headache   . High cholesterol   . Hypothyroidism   . Mental disorder   . OAB (overactive bladder) 05/21/2013  . Obstructive sleep apnea    Noncompliant with CPAP due to claustophobia.  . Panic attacks   . Rectocele 05/21/2013  . Seizures (HCC)    1 severe seizure 2015; has had "small ones" since including 9/18  . Type 2 diabetes mellitus (HCC)     Medications:  See medication history  Assessment: 61 yo lady to start heparin for CP.  She was not on anticoagulation PTA Goal of Therapy:  Heparin level 0.3-0.7 units/ml Monitor platelets by anticoagulation protocol: Yes   Plan:  Heparin bolus 4000 units and drip at 1400 units/hr Check heparin level ~8 hours after start Daily HL and CBC while on heparin Monitor for bleeding complications  77 [place] 09/04/2017,6:22 PM

## 2017-09-05 ENCOUNTER — Observation Stay (HOSPITAL_COMMUNITY): Payer: Medicare Other

## 2017-09-05 ENCOUNTER — Observation Stay (HOSPITAL_BASED_OUTPATIENT_CLINIC_OR_DEPARTMENT_OTHER): Payer: Medicare Other

## 2017-09-05 DIAGNOSIS — I10 Essential (primary) hypertension: Secondary | ICD-10-CM | POA: Diagnosis not present

## 2017-09-05 DIAGNOSIS — M5023 Other cervical disc displacement, cervicothoracic region: Secondary | ICD-10-CM | POA: Diagnosis not present

## 2017-09-05 DIAGNOSIS — G40309 Generalized idiopathic epilepsy and epileptic syndromes, not intractable, without status epilepticus: Secondary | ICD-10-CM | POA: Diagnosis not present

## 2017-09-05 DIAGNOSIS — E039 Hypothyroidism, unspecified: Secondary | ICD-10-CM

## 2017-09-05 DIAGNOSIS — G4733 Obstructive sleep apnea (adult) (pediatric): Secondary | ICD-10-CM | POA: Diagnosis not present

## 2017-09-05 DIAGNOSIS — R079 Chest pain, unspecified: Secondary | ICD-10-CM

## 2017-09-05 DIAGNOSIS — R51 Headache: Secondary | ICD-10-CM | POA: Diagnosis not present

## 2017-09-05 DIAGNOSIS — E119 Type 2 diabetes mellitus without complications: Secondary | ICD-10-CM

## 2017-09-05 DIAGNOSIS — E785 Hyperlipidemia, unspecified: Secondary | ICD-10-CM | POA: Diagnosis not present

## 2017-09-05 DIAGNOSIS — R072 Precordial pain: Secondary | ICD-10-CM | POA: Diagnosis not present

## 2017-09-05 DIAGNOSIS — R0602 Shortness of breath: Secondary | ICD-10-CM | POA: Diagnosis not present

## 2017-09-05 LAB — ECHOCARDIOGRAM COMPLETE
AO mean calculated velocity dopler: 103 cm/s
AV Area VTI index: 1.05 cm2/m2
AV Area VTI: 2.72 cm2
AV Peak grad: 13 mmHg
AV VEL mean LVOT/AV: 0.84
AVAREAMEANV: 2.65 cm2
AVAREAMEANVIN: 1.12 cm2/m2
AVG: 5 mmHg
AVPKVEL: 180 cm/s
Ao pk vel: 0.87 m/s
CHL CUP AV PEAK INDEX: 1.15
CHL CUP AV VALUE AREA INDEX: 1.05
CHL CUP AV VEL: 2.49
CHL CUP MV DEC (S): 225
E decel time: 225 msec
E/e' ratio: 8.22
FS: 44 % (ref 28–44)
HEIGHTINCHES: 67 in
IVS/LV PW RATIO, ED: 1.34
LA ID, A-P, ES: 33 mm
LA diam end sys: 33 mm
LA diam index: 1.39 cm/m2
LA vol: 44 mL
LAVOLA4C: 41.2 mL
LAVOLIN: 18.6 mL/m2
LV E/e'average: 8.22
LV TDI E'MEDIAL: 4.68
LV dias vol: 73 mL (ref 46–106)
LVDIAVOLIN: 31 mL/m2
LVEEMED: 8.22
LVELAT: 8.92 cm/s
LVOT VTI: 29.8 cm
LVOT area: 3.14 cm2
LVOT peak grad rest: 10 mmHg
LVOT peak vel: 156 cm/s
LVOTD: 20 mm
LVOTSV: 94 mL
LVOTVTI: 0.79 cm
LVSYSVOL: 29 mL
LVSYSVOLIN: 12 mL/m2
Lateral S' vel: 12.7 cm/s
MV Peak grad: 2 mmHg
MV pk A vel: 103 m/s
MV pk E vel: 73.3 m/s
PW: 10 mm — AB (ref 0.6–1.1)
Simpson's disk: 60
Stroke v: 44 ml
TAPSE: 18.7 mm
TDI e' lateral: 8.92
VTI: 37.6 cm
Valve area: 2.49 cm2
WEIGHTICAEL: 3996.5 [oz_av]

## 2017-09-05 LAB — GLUCOSE, CAPILLARY
GLUCOSE-CAPILLARY: 76 mg/dL (ref 65–99)
GLUCOSE-CAPILLARY: 101 mg/dL — AB (ref 65–99)
Glucose-Capillary: 72 mg/dL (ref 65–99)
Glucose-Capillary: 71 mg/dL (ref 65–99)

## 2017-09-05 LAB — HEMOGLOBIN A1C
HEMOGLOBIN A1C: 5.3 % (ref 4.8–5.6)
MEAN PLASMA GLUCOSE: 105.41 mg/dL

## 2017-09-05 LAB — TSH: TSH: 0.109 u[IU]/mL — AB (ref 0.350–4.500)

## 2017-09-05 LAB — LIPID PANEL
CHOL/HDL RATIO: 3 ratio
CHOLESTEROL: 119 mg/dL (ref 0–200)
HDL: 40 mg/dL — ABNORMAL LOW (ref >40)
LDL Cholesterol: 65 mg/dL (ref 0–99)
TRIGLYCERIDES: 69 mg/dL (ref <150)
VLDL: 14 mg/dL (ref 0–40)

## 2017-09-05 LAB — TROPONIN I
Troponin I: <0.03 ng/mL (ref <0.03)
Troponin I: <0.03 ng/mL (ref <0.03)

## 2017-09-05 MED ORDER — LORAZEPAM 2 MG/ML IJ SOLN
1.0000 mg | Freq: Once | INTRAMUSCULAR | Status: AC
  Administered 2017-09-05: 1 mg via INTRAVENOUS
  Filled 2017-09-05: qty 1

## 2017-09-05 MED ORDER — INFLUENZA VAC SPLIT QUAD 0.5 ML IM SUSY: 0.5000 mL | PREFILLED_SYRINGE | INTRAMUSCULAR | Status: DC

## 2017-09-05 MED ORDER — KETOROLAC TROMETHAMINE 30 MG/ML IJ SOLN
30.0000 mg | Freq: Four times a day (QID) | INTRAMUSCULAR | Status: DC | PRN
  Administered 2017-09-05: 30 mg via INTRAVENOUS
  Filled 2017-09-05: qty 1

## 2017-09-05 NOTE — Progress Notes (Signed)
*  PRELIMINARY RESULTS* Echocardiogram 2D Echocardiogram has been performed.  Stacey Drain 09/05/2017, 3:45 PM

## 2017-09-05 NOTE — Progress Notes (Signed)
Patient IV removed, telemetry removed, tolerated well. Patient given discharge instructions at bedside.  

## 2017-09-05 NOTE — Care Management Note (Signed)
Case Management Note  Patient Details  Name: [name] MRN: [id number] Date of Birth: 1956/12/12  Subjective/Objective:                  Admitted r/o CP. Pt from home, ind, lives with husband. Pt seen to give MOON and chart reviewed for CM needs. Pt has PCP, transportation to appointments and insurance with drug coverage. Pt communicates no needs or concerns about DC.   Action/Plan: DC home with self care today if Echo looks okay.   Expected Discharge Date:      09/05/2017            Expected Discharge Plan:  Home/Self Care  In-House Referral:  NA  Discharge planning Services  CM Consult  Post Acute Care Choice:  NA Choice offered to:  NA  Status of Service:  Completed, signed off  Malcolm Metro, RN 09/05/2017, 10:38 AM

## 2017-09-05 NOTE — Care Management Obs Status (Signed)
MEDICARE OBSERVATION STATUS NOTIFICATION   Patient Details  Name: [name] MRN: [id number] Date of Birth: [birth year]   Medicare Observation Status Notification Given:  Yes    Malcolm Metro, RN 09/05/2017, 8:47 AM

## 2017-09-05 NOTE — Consult Note (Signed)
Primary cardiologist: Dr Simona Huh Consulting cardiologist:Dr Dina Rich Requesting physician: Dr Kerry Hough Indication: chest pani  Clinical Summary Ms. Coover is a 61 y.o.female history of chronic chest pain with prior negative ischemic workups including cath 2014, DM2, HTN, HL, fibromylagia, OSA presents with chest pain. Episode started last night while laying down. 10/10 sharp pain mid chest with SOB, lasted several hours. Worst with position and palpation. Repeat episode last night that also lasted several hours. Currently pain free.     K 3.8, Cr 0.76, Hgb 14.5, Plt 214,  Trop neg x 4 CXR no acute process  EKG SR, no ischemic changes Allergies  Allergen Reactions  . Aspirin Nausea And Vomiting  . Codeine Rash  . Latex Rash    Medications Scheduled Medications: . atorvastatin  40 mg Oral QHS  . enoxaparin (LOVENOX) injection  40 mg Subcutaneous Q24H  . [START ON 09/06/2017] Influenza vac split quadrivalent PF  0.5 mL Intramuscular Tomorrow-1000  . insulin aspart  0-15 Units Subcutaneous TID WC  . levETIRAcetam  500 mg Oral BID  . levothyroxine  100 mcg Oral QAC breakfast  . metoprolol tartrate  50 mg Oral BID  . oxybutynin  10 mg Oral QAC breakfast  . oxybutynin  5 mg Oral 2 times per day     Infusions:   PRN Medications:  acetaminophen, gi cocktail, hydrALAZINE, morphine injection, nitroGLYCERIN, ondansetron (ZOFRAN) IV   Past Medical History:  Diagnosis Date  . Asthma   . Chronic back pain   . Chronic leg pain    Bilateral  . Chronic pelvic pain in female   . Essential hypertension   . Fibromyalgia   . H/O cardiac catheterization    No significant CAD (only 20% LAD) 03/02/13 with normal LV function.  Marland Kitchen Headache   . High cholesterol   . Hypothyroidism   . Mental disorder   . OAB (overactive bladder) 05/21/2013  . Obstructive sleep apnea    Noncompliant with CPAP due to claustophobia.  . Panic attacks   . Rectocele 05/21/2013  . Seizures (HCC)      1 severe seizure 2015; has had "small ones" since including 9/18  . Type 2 diabetes mellitus (HCC)     Past Surgical History:  Procedure Laterality Date  . ABDOMINAL HYSTERECTOMY    . ANKLE SURGERY    . BACK SURGERY    . CARPAL TUNNEL RELEASE    . knee replacements     bilateral  . LEFT HEART CATHETERIZATION WITH CORONARY ANGIOGRAM N/A 03/02/2013   Procedure: LEFT HEART CATHETERIZATION WITH CORONARY ANGIOGRAM;  Surgeon: Peter M Swaziland, MD;  Location: [facility] CATH LAB;  Service: Cardiovascular;  Laterality: N/A;  . TONSILLECTOMY    . TUBAL LIGATION      Family History  Problem Relation Age of Onset  . Heart attack Mother   . Diabetes Mother   . Hypertension Mother   . Sick sinus syndrome Brother        Pacemaker  . Congenital heart disease Brother   . Stroke Brother   . Coronary artery disease Brother   . Cancer Maternal Aunt        breast, liver,lung  . Cancer Cousin        leukemia    Social History Ms. Warbington reports that she quit smoking about 42 years ago. Her smoking use included Cigarettes. She has a 2.00 pack-year smoking history. She has never used smokeless tobacco. Ms. Goodell reports that she does not drink alcohol.  Review of Systems CONSTITUTIONAL: No weight loss, fever, chills, weakness or fatigue.  HEENT: Eyes: No visual loss, blurred vision, double vision or yellow sclerae. No hearing loss, sneezing, congestion, runny nose or sore throat.  SKIN: No rash or itching.  CARDIOVASCULAR: per hpi RESPIRATORY:per hpi GASTROINTESTINAL: No anorexia, nausea, vomiting or diarrhea. No abdominal pain or blood.  GENITOURINARY: no polyuria, no dysuria NEUROLOGICAL: +headache  MUSCULOSKELETAL: +neck pain HEMATOLOGIC: No anemia, bleeding or bruising.  LYMPHATICS: No enlarged nodes. No history of splenectomy.  PSYCHIATRIC: No history of depression or anxiety.      Physical Examination Blood pressure (!) 142/86, pulse (!) 50, temperature 97.8 F (36.6 C), temperature  source Oral, resp. rate 20, height 5\' 7"  (1.702 m), weight 249 lb 12.5 oz (113.3 kg), SpO2 100 %.  Intake/Output Summary (Last 24 hours) at 09/05/17 0947 Last data filed at 09/04/17 2300  Gross per 24 hour  Intake              240 ml  Output                0 ml  Net              240 ml    HEENT: sclera clear, throat clear  Cardiovascular: RRR, no m/r/g, no jvd  Respiratory: CTAB  GI: abdomen soft, NT, ND  MSK: no LE edema  Neuro: no focal deficits  Psych: appropriate affect   Lab Results  Basic Metabolic Panel:  Recent Labs Lab 09/04/17 1524  NA 140  K 3.8  CL 105  CO2 27  GLUCOSE 86  BUN 11  CREATININE 0.76  CALCIUM 9.1    Liver Function Tests:  Recent Labs Lab 09/04/17 1524  AST 31  ALT 25  ALKPHOS 95  BILITOT 0.6  PROT 7.8  ALBUMIN 4.4    CBC:  Recent Labs Lab 09/04/17 1524  WBC 6.2  NEUTROABS 3.5  HGB 14.5  HCT 43.0  MCV 89.2  PLT 214    Cardiac Enzymes:  Recent Labs Lab 09/04/17 1524 09/04/17 2212 09/05/17 0257 09/05/17 0807  TROPONINI <0.03 <0.03 <0.03 <0.03    BNP: Invalid input(s): POCBNP     Impression/Recommendations 1. Atypical chest pain - several year history of atypical chest pain with negative ischemic workups in the past including stress tests and cath, most recent cath 2014 without obstructive CAD - pain this admission similar to her prior chest pain. Chest wall is tender to palpation. Nothing suggests ischemic heart disease at this time - f/u echo results, if no changes then no further workup planned     2015, M.D.

## 2017-09-05 NOTE — Discharge Summary (Signed)
Physician Discharge Summary  [name]:[id number] DOB: [birth year] DOA: 09/04/2017  PCP: Benita Stabile, MD  Admit date: 09/04/2017 Discharge date: 09/05/2017  Admitted From: Home Disposition: Home  Recommendations for Outpatient Follow-up:  1. Follow up with PCP in 1-2 weeks 2. Please obtain BMP/CBC in one week 3. Patient has been referred back to follow-up with her primary neurologist  Home Health: Equipment/Devices:  Discharge Condition:Stable CODE STATUS:Full code Diet recommendation: Heart Healthy / Carb Modified   Brief/Interim Summary: 61 -year-old female with a history of diabetes, seizure disorder, OSA, hypothyroidism, fibromyalgia and chronic pain. She presented to the hospital with complaints of chest pain. She also had right-sided neck pain with associated right-sided headache as well as numbness and paresthesias on the right side of her head/face. She reports that neck pain and headache are somewhat chronic and she is followed by a neurologist. Findings were numbness/paresthesia. She also had chest pain. She ruled out for ACS with negative cardiac markers. Echocardiogram was also unremarkable. She was seen by cardiology who did not feel that further ischemic workup was warranted at this time. It was likely that her pain may be superficial or related to fibromyalgia. Regarding her new neuro findings of numbness/paresthesia, this has since resolved. She underwent MRI brain and C-spine which were reported as negative studies. She has been advised to follow-up with her primary neurologist to discuss this further. Her imipramine has been discontinued since it will lower her seizure threshold. She reports her last seizure was approximately a month ago. She has been advised not to drive until cleared by her neurologist. Patient is otherwise stable for discharge.  Discharge Diagnoses:  Principal Problem:   Chest pain Active Problems:   Hypertension   Diabetes (HCC)    Hyperlipidemia   Epilepsy, generalized, convulsive (HCC)   Obstructive sleep apnea   Hypothyroidism    Discharge Instructions  Discharge Instructions    Ambulatory referral to Neurology    Complete by:  As directed    An appointment is requested in approximately: 1-2 weeks, follow up for headache and right facial numbness   Diet - low sodium heart healthy    Complete by:  As directed    Increase activity slowly    Complete by:  As directed      Allergies as of 09/05/2017      Reactions   Aspirin Nausea And Vomiting   Codeine Rash   Latex Rash      Medication List    STOP taking these medications   imipramine 50 MG tablet Commonly known as:  TOFRANIL     TAKE these medications   albuterol 108 (90 Base) MCG/ACT inhaler Commonly known as:  PROVENTIL HFA;VENTOLIN HFA Inhale 1-2 puffs into the lungs every 6 (six) hours as needed for wheezing or shortness of breath.   atorvastatin 40 MG tablet Commonly known as:  LIPITOR Take 40 mg by mouth at bedtime.   Fluocinolone Acetonide Scalp 0.01 % Oil Apply 1 application topically daily as needed.   ibuprofen 200 MG tablet Commonly known as:  ADVIL,MOTRIN Take 400 mg by mouth as needed.   levETIRAcetam 500 MG tablet Commonly known as:  KEPPRA Take 1 tablet (500 mg total) by mouth 2 (two) times daily.   levothyroxine 100 MCG tablet Commonly known as:  SYNTHROID, LEVOTHROID Take 100 mcg by mouth daily.   metFORMIN 500 MG tablet Commonly known as:  GLUCOPHAGE Take 500 mg by mouth 2 (two) times daily with a meal.  metoprolol tartrate 50 MG tablet Commonly known as:  LOPRESSOR Take 50 mg by mouth 2 (two) times daily.   nystatin cream Commonly known as:  MYCOSTATIN APPLY TO AFFECTED AREA(S) TWICE DAILY   nystatin-triamcinolone cream Commonly known as:  MYCOLOG II Apply 1 application topically 2 (two) times daily.   oxybutynin 5 MG tablet Commonly known as:  DITROPAN Take 2 in am, 1 at mid day and 1 in pm    triamcinolone cream 0.1 % Commonly known as:  KENALOG APPLY TO AFFECTED AREA(S) TWICE DAILY            Discharge Care Instructions        Start     Ordered   09/05/17 0000  Ambulatory referral to Neurology    Comments:  An appointment is requested in approximately: 1-2 weeks, follow up for headache and right facial numbness   09/05/17 1624   09/05/17 0000  Increase activity slowly     09/05/17 1624   09/05/17 0000  Diet - low sodium heart healthy     09/05/17 1624      Allergies  Allergen Reactions  . Aspirin Nausea And Vomiting  . Codeine Rash  . Latex Rash    Consultations:  Cardiology   Procedures/Studies: Dg Chest 2 View  Result Date: 09/04/2017 CLINICAL DATA:  Chest pain.  History of hypertension, diabetes. EXAM: CHEST  2 VIEW COMPARISON:  Chest radiograph May 16, 2017 FINDINGS: Cardiomediastinal silhouette is normal. No pleural effusions or focal consolidations. Trachea projects midline and there is no pneumothorax. Soft tissue planes and included osseous structures are non-suspicious. Mild degenerative change of the thoracic spine. IMPRESSION: Stable examination:  No acute cardiopulmonary process. Electronically Signed   By: Awilda Metro M.D.   On: 09/04/2017 16:58   Ct Head Wo Contrast  Result Date: 09/04/2017 CLINICAL DATA:  Headache for several days, LEFT chest pain and shortness of breath. EXAM: CT HEAD WITHOUT CONTRAST TECHNIQUE: Contiguous axial images were obtained from the base of the skull through the vertex without intravenous contrast. COMPARISON:  MRI of the head August 22, 2015 and CT HEAD August 22, 2015 FINDINGS: BRAIN: No intraparenchymal hemorrhage, mass effect nor midline shift. The ventricles and sulci are normal for age. Minimal supratentorial white matter hypodensities within normal range for patient's age, though non-specific are most compatible with chronic small vessel ischemic disease. No acute large vascular territory infarcts. No  abnormal extra-axial fluid collections. Basal cisterns are patent. VASCULAR: Mild calcific atherosclerosis of the carotid siphons. SKULL: No skull fracture. No significant scalp soft tissue swelling. SINUSES/ORBITS: The mastoid air-cells and included paranasal sinuses are well-aerated.The included ocular globes and orbital contents are non-suspicious. Status post bilateral ocular lens implants. OTHER: None. IMPRESSION: Negative noncontrast CT HEAD for age. Electronically Signed   By: Awilda Metro M.D.   On: 09/04/2017 16:55   Mr Brain Wo Contrast  Result Date: 09/05/2017 CLINICAL DATA:  LEFT chest pain and shortness of breath, headache. History of seizures, headaches, diabetes, hypertension. EXAM: MRI HEAD WITHOUT CONTRAST MRI CERVICAL SPINE WITHOUT CONTRAST TECHNIQUE: Multiplanar, multiecho pulse sequences of the brain and surrounding structures, and cervical spine, to include the craniocervical junction and cervicothoracic junction, were obtained without intravenous contrast. COMPARISON:  CT HEAD September 23rd 2018 and MRI of the head August 22, 2015 FINDINGS: MRI HEAD FINDINGS BRAIN: No reduced diffusion to suggest acute ischemia hyperacute demyelination or hypercellular tumor. No susceptibility artifact to suggest hemorrhage. White matter changes normal for age. The ventricles and sulci are  normal for patient's age. No suspicious parenchymal signal, mass or mass effect. No abnormal extra-axial fluid collections. VASCULAR: Normal major intracranial vascular flow voids present at skull base. SKULL AND UPPER CERVICAL SPINE: No abnormal sellar expansion. No suspicious calvarial bone marrow signal. Craniocervical junction maintained. SINUSES/ORBITS: Mildly atretic RIGHT maxillary sinus seen with chronic sinusitis, no acute component. Mastoid air cells are well aerated. Status post bilateral ocular lens implants. The included ocular globes and orbital contents are non-suspicious. OTHER: None. MRI CERVICAL  SPINE FINDINGS- large body habitus and patient motion results in decreased signal to noise ratio. ALIGNMENT: Straightened cervical lordosis.  No malalignment. VERTEBRAE/DISCS: Vertebral bodies are intact. Intervertebral disc morphology's and signal are normal. No acute or abnormal bone marrow signal. CORD:Cervical spinal cord is normal morphology and signal characteristics from the cervicomedullary junction to level of T1-2, the most caudal well visualized level. POSTERIOR FOSSA, VERTEBRAL ARTERIES, PARASPINAL TISSUES: No MR findings of ligamentous injury. Vertebral artery flow voids present. Included posterior fossa and paraspinal soft tissues are normal. DISC LEVELS (severely limited axial sequences): C2-3 through C5-6: No disc bulge, canal stenosis nor neural foraminal narrowing. C6-7: 2 mm broad-based disc bulge without canal stenosis or neural foraminal narrowing. C7-T1: No disc bulge, canal stenosis nor neural foraminal narrowing. IMPRESSION: Negative mildly motion degraded noncontrast MRI of the head for age. Negative motion and habitus degraded noncontrast MRI of the cervical spine for age. Electronically Signed   By: Awilda Metro M.D.   On: 09/05/2017 15:19   Mr Cervical Spine Wo Contrast  Result Date: 09/05/2017 CLINICAL DATA:  LEFT chest pain and shortness of breath, headache. History of seizures, headaches, diabetes, hypertension. EXAM: MRI HEAD WITHOUT CONTRAST MRI CERVICAL SPINE WITHOUT CONTRAST TECHNIQUE: Multiplanar, multiecho pulse sequences of the brain and surrounding structures, and cervical spine, to include the craniocervical junction and cervicothoracic junction, were obtained without intravenous contrast. COMPARISON:  CT HEAD September 23rd 2018 and MRI of the head August 22, 2015 FINDINGS: MRI HEAD FINDINGS BRAIN: No reduced diffusion to suggest acute ischemia hyperacute demyelination or hypercellular tumor. No susceptibility artifact to suggest hemorrhage. White matter changes  normal for age. The ventricles and sulci are normal for patient's age. No suspicious parenchymal signal, mass or mass effect. No abnormal extra-axial fluid collections. VASCULAR: Normal major intracranial vascular flow voids present at skull base. SKULL AND UPPER CERVICAL SPINE: No abnormal sellar expansion. No suspicious calvarial bone marrow signal. Craniocervical junction maintained. SINUSES/ORBITS: Mildly atretic RIGHT maxillary sinus seen with chronic sinusitis, no acute component. Mastoid air cells are well aerated. Status post bilateral ocular lens implants. The included ocular globes and orbital contents are non-suspicious. OTHER: None. MRI CERVICAL SPINE FINDINGS- large body habitus and patient motion results in decreased signal to noise ratio. ALIGNMENT: Straightened cervical lordosis.  No malalignment. VERTEBRAE/DISCS: Vertebral bodies are intact. Intervertebral disc morphology's and signal are normal. No acute or abnormal bone marrow signal. CORD:Cervical spinal cord is normal morphology and signal characteristics from the cervicomedullary junction to level of T1-2, the most caudal well visualized level. POSTERIOR FOSSA, VERTEBRAL ARTERIES, PARASPINAL TISSUES: No MR findings of ligamentous injury. Vertebral artery flow voids present. Included posterior fossa and paraspinal soft tissues are normal. DISC LEVELS (severely limited axial sequences): C2-3 through C5-6: No disc bulge, canal stenosis nor neural foraminal narrowing. C6-7: 2 mm broad-based disc bulge without canal stenosis or neural foraminal narrowing. C7-T1: No disc bulge, canal stenosis nor neural foraminal narrowing. IMPRESSION: Negative mildly motion degraded noncontrast MRI of the head for age. Negative motion and  habitus degraded noncontrast MRI of the cervical spine for age. Electronically Signed   By: Awilda Metro M.D.   On: 09/05/2017 15:19    Echo:- Left ventricle: The cavity size was normal. Wall thickness was   increased in a  pattern of moderate LVH. Systolic function was   normal. The estimated ejection fraction was in the range of 60%   to 65%. Wall motion was normal; there were no regional wall   motion abnormalities. Doppler parameters are consistent with   abnormal left ventricular relaxation (grade 1 diastolic   dysfunction). - Aortic valve: Mildly calcified annulus. Trileaflet; normal   thickness leaflets. Valve area (VTI): 2.49 cm^2. Valve area   (Vmax): 2.72 cm^2. Valve area (Vmean): 2.65 cm^2. - Mitral valve: Mildly calcified annulus. Normal thickness leaflets   Subjective: Still has headache, overall numbness on right side of face is better  Discharge Exam: Vitals:   09/04/17 2244 09/05/17 0300  BP:  (!) 142/86  Pulse:  (!) 50  Resp:  20  Temp:  97.8 F (36.6 C)  SpO2: 100% 100%   Vitals:   09/04/17 2116 09/04/17 2240 09/04/17 2244 09/05/17 0300  BP: (!) 154/79 (!) 154/94  (!) 142/86  Pulse: (!) 52 (!) 57  (!) 50  Resp: Temp: 98.4 F (36.9 C) 97.7 F (36.5 C)  97.8 F (36.6 C)  TempSrc: Oral Oral  Oral  SpO2: 97% 100% 100% 100%  Weight: 113.3 kg (249 lb 12.5 oz)     Height:  (1.702 m)       General: Pt is alert, awake, not in acute distress Cardiovascular: RRR, S1/S2 +, no rubs, no gallops Respiratory: CTA bilaterally, no wheezing, no rhonchi Abdominal: Soft, NT, ND, bowel sounds + Extremities: no edema, no cyanosis    The results of significant diagnostics from this hospitalization (including imaging, microbiology, ancillary and laboratory) are listed below for reference.     Microbiology: No results found for this or any previous visit (from the past 240 hour(s)).   Labs: BNP (last 3 results) No results for input(s): BNP in the last 8760 hours. Basic Metabolic Panel:  Recent Labs Lab 09/04/17 1524  NA 140  K 3.8  CL 105  CO2 27  GLUCOSE 86  BUN 11  CREATININE 0.76  CALCIUM 9.1   Liver Function Tests:  Recent Labs Lab 09/04/17 1524   AST 31  ALT 25  ALKPHOS 95  BILITOT 0.6  PROT 7.8  ALBUMIN 4.4   No results for input(s): LIPASE, AMYLASE in the last 168 hours. No results for input(s): AMMONIA in the last 168 hours. CBC:  Recent Labs Lab 09/04/17 1524  WBC 6.2  NEUTROABS 3.5  HGB 14.5  HCT 43.0  MCV 89.2  PLT 214   Cardiac Enzymes:  Recent Labs Lab 09/04/17 1524 09/04/17 2212 09/05/17 0257 09/05/17 0807  TROPONINI <0.03 <0.03 <0.03 <0.03   BNP: Invalid input(s): POCBNP CBG:  Recent Labs Lab 09/04/17 2123 09/05/17 0318 09/05/17 0742 09/05/17 1129 09/05/17 1559  GLUCAP 88 72 71 76 101*   D-Dimer No results for input(s): DDIMER in the last 72 hours. Hgb A1c  Recent Labs  09/04/17 2211  HGBA1C 5.3   Lipid Profile  Recent Labs  09/05/17 0257  CHOL 119  HDL 40*  LDLCALC 65  TRIG 69  CHOLHDL 3.0   Thyroid function studies  Recent Labs  09/04/17 1525  TSH 0.109*   Anemia work up No results for input(s):  VITAMINB12, FOLATE, FERRITIN, TIBC, IRON, RETICCTPCT in the last 72 hours. Urinalysis    Component Value Date/Time   COLORURINE YELLOW 08/22/2016 2024   APPEARANCEUR CLEAR 08/22/2016 2024   LABSPEC >1.030 (H) 08/22/2016 2024   PHURINE 5.5 08/22/2016 2024   GLUCOSEU NEGATIVE 08/22/2016 2024   HGBUR NEGATIVE 08/22/2016 2024   BILIRUBINUR NEGATIVE 08/22/2016 2024   KETONESUR NEGATIVE 08/22/2016 2024   PROTEINUR NEGATIVE 08/22/2016 2024   UROBILINOGEN 0.2 08/22/2015 2007   NITRITE NEGATIVE 08/22/2016 2024   LEUKOCYTESUR NEGATIVE 08/22/2016 2024   Sepsis Labs Invalid input(s): PROCALCITONIN,  WBC,  LACTICIDVEN Microbiology No results found for this or any previous visit (from the past 240 hour(s)).   Time coordinating discharge: Over 30 minutes  SIGNED:   Erick Blinks, MD  [facility] 09/05/2017, 4:25 PM Pager   If 7PM-7AM, please contact night-coverage [email or web address] Password [email or web address]

## 2017-09-05 NOTE — Progress Notes (Signed)
At approximately 2240, patient c/o worsened sharp chest pain rated 10/10 on verbal pain scale without radiation. Patient tearful. Patient stated that she was sleeping, then woke up for labs drawn with sudden increase in chest pain. Patient also stated she was having SOB and continued dizziness. VS per flow sheet. O2 2L [place] applied for comfort. Nitroglycerin given SL per PRN orders. Morphine 2mg  given IV per PRN orders. notified of chest pain, interventions, and slow decrease in pain. Also, notified of bradycardia and Metoprolol held per orders. Hydralazine ordered PRN for SBP>160, which it is not. No further orders. Will continue to monitor.

## 2017-09-06 LAB — HIV ANTIBODY (ROUTINE TESTING W REFLEX): HIV Screen 4th Generation wRfx: NONREACTIVE

## 2017-09-13 ENCOUNTER — Encounter: Payer: Self-pay | Admitting: Neurology

## 2017-09-13 ENCOUNTER — Ambulatory Visit (INDEPENDENT_AMBULATORY_CARE_PROVIDER_SITE_OTHER): Payer: Medicare Other | Admitting: Neurology

## 2017-09-13 VITALS — BP 118/75 | HR 59 | Resp 20 | Ht 67.0 in | Wt 247.0 lb

## 2017-09-13 DIAGNOSIS — G4733 Obstructive sleep apnea (adult) (pediatric): Secondary | ICD-10-CM

## 2017-09-13 DIAGNOSIS — M5481 Occipital neuralgia: Secondary | ICD-10-CM

## 2017-09-13 DIAGNOSIS — M542 Cervicalgia: Secondary | ICD-10-CM

## 2017-09-13 DIAGNOSIS — R51 Headache: Secondary | ICD-10-CM

## 2017-09-13 DIAGNOSIS — G2581 Restless legs syndrome: Secondary | ICD-10-CM

## 2017-09-13 DIAGNOSIS — G4700 Insomnia, unspecified: Secondary | ICD-10-CM

## 2017-09-13 DIAGNOSIS — R569 Unspecified convulsions: Secondary | ICD-10-CM | POA: Diagnosis not present

## 2017-09-13 DIAGNOSIS — R519 Headache, unspecified: Secondary | ICD-10-CM

## 2017-09-13 MED ORDER — GABAPENTIN 600 MG PO TABS: ORAL_TABLET | ORAL | 11 refills | Status: DC

## 2017-09-13 NOTE — Progress Notes (Signed)
[facility]  PATIENT: [name] DOB: Jan 19, 1956  REFERRING DOCTOR OR PCP:  Shelva Majestic SOURCE: patient and records form EMR and Dr. [name]  _________________________________   HISTORICAL  CHIEF COMPLAINT:  Chief Complaint  Patient presents with  . Seizures    Denies known sz. activity since last ov and sts. is compliant with Keppra.  Sts. h/a's are daily, starting in neck and radiating up back of head. New c/o right sided face/head numbness onset one wk. ago. Does not use CPAP or oral appliance for OSA/fim  . Pseudotumor Cerebri  . Neck Pain  . Sleep Apnea    HISTORY OF PRESENT ILLNESS:  [name] is a 61 year old woman with a h/o a generalized tonic-clonic seizure in 2016  with headaches and obstructive sleep apnea.  Update 09/13/2017:  She has had a mil daily headache x one month or more but much more severe the last week.    HA is right sided and throbbing.  She has no N/V.   She has photophobia > phonophobia.    Moving the head worsens the pain when more intense.    Ibuprofen takes the edge off for a few hours and then HA returns more intensely.    She went to the ED 09/04/2017 with ingtense HA and chest pain.    NTG w as given which worsened the HA.  She was admitted x 1 day and was given toradol, lorazepam, morphine and Zofran.  She had an MRI of the brain and MRI of the cervical spine that were read as normal for age. Specifically the cervical MRI just shows a C6C7 bulge.     She has only worn CPAP intermittently and we discussed trying to wear it the entire night.     She notes insomnia, sleep onset and sleep maintenance issues.   She laso has to move  She has not had any seizures since 2016   _____________________________________________________- From 05/19/2016 Seizure: She denies any seizures since the last visit.   In August 2015, she had generalized tonic-clonic seizure activity that was witnessed by her husband. She has had no prior seizures  and none since.   She continues is on Keppra.  An MRI of the brain at that time was essentially normal. An EEG was performed and showed 2 runs of generalized spike wave activity, consistent with a generalized seizure disorder. She is unaware of any family history of seizures.    She has not had head trauma in the past. She had a normal childhood development. She has not had any meningitis or encephalitis.  Pseudotumor cerebri/headache and neck pain:   Due to headaches and papilledema, she underwent a LP.    CSF pressure was elevated At 26.5 cm..   She had a lot of bladder issues on Diamox and stopped.   Headaches are mostly occipital.  However, headaches never returned to the severity or frequency that they were before the lumbar puncture. The HA occurs 1 -2 times a week.  Ibuprofen often knocks the headache out, especially if she lays down.   Her blurry vision is better.    A recent eye exam reportedly just showed cataract.   Lightheadedness with standing up is also better.    She reports that her ophthalmologist told her that the optic nerve changes were due to cataracts.  Obstructive sleep apnea: About 4 or 5 years ago she was diagnosed with OSA with an overnight sleep study. She was started on CPAP +6  cm in the past.    However, she had trouble tolerating the mask and stopped.   She is claustophobic so seldom made it through the night with CPAP.    She reports some fatigue and daytime sleepiness.     REVIEW OF SYSTEMS: Constitutional: No fevers, chills, sweats, or change in appetite.   She notes some fatigue and sleepiness. Eyes: cataracts.    Improved blurriness Ear, nose and throat: No hearing loss, ear pain, nasal congestion, sore throat Cardiovascular: No chest pain, palpitations Respiratory: No shortness of breath at rest or with exertion.   Some cough and wheezes GastrointestinaI: No nausea, vomiting, diarrhea, abdominal pain, fecal incontinence Genitourinary: No dysuria, urinary retention  or frequency.  No nocturia. Musculoskeletal: No neck pain, back pain Integumentary: No rash, pruritus, skin lesions Neurological: as above Psychiatric: No depression at this time.  No anxiety.  Notes increased stress at times. Endocrine: No palpitations, diaphoresis, change in appetite, change in weigh or increased thirst Hematologic/Lymphatic: No anemia, purpura, petechiae. Allergic/Immunologic: No itchy/runny eyes, nasal congestion, recent allergic reactions, rashes  ALLERGIES: Allergies  Allergen Reactions  . Aspirin Nausea And Vomiting  . Codeine Rash  . Latex Rash    HOME MEDICATIONS:  Current Outpatient Prescriptions:  .  albuterol (PROVENTIL HFA;VENTOLIN HFA) 108 (90 Base) MCG/ACT inhaler, Inhale 1-2 puffs into the lungs every 6 (six) hours as needed for wheezing or shortness of breath., Disp: , Rfl:  .  atorvastatin (LIPITOR) 40 MG tablet, Take 40 mg by mouth at bedtime. , Disp: , Rfl:  .  Fluocinolone Acetonide Scalp 0.01 % OIL, Apply 1 application topically daily as needed., Disp: , Rfl:  .  ibuprofen (ADVIL,MOTRIN) 200 MG tablet, Take 400 mg by mouth as needed., Disp: , Rfl:  .  levETIRAcetam (KEPPRA) 500 MG tablet, Take 1 tablet (500 mg total) by mouth 2 (two) times daily., Disp: 60 tablet, Rfl: 11 .  levothyroxine (SYNTHROID, LEVOTHROID) 100 MCG tablet, Take 100 mcg by mouth daily., Disp: , Rfl:  .  metFORMIN (GLUCOPHAGE) 500 MG tablet, Take 500 mg by mouth 2 (two) times daily with a meal. , Disp: , Rfl:  .  metoprolol (LOPRESSOR) 50 MG tablet, Take 50 mg by mouth 2 (two) times daily.  , Disp: , Rfl:  .  nystatin cream (MYCOSTATIN), APPLY TO AFFECTED AREA(S) TWICE DAILY, Disp: 30 g, Rfl: 11 .  nystatin-triamcinolone (MYCOLOG II) cream, Apply 1 application topically 2 (two) times daily., Disp: 30 g, Rfl: 2 .  oxybutynin (DITROPAN) 5 MG tablet, Take 2 in am, 1 at mid day and 1 in pm, Disp: 120 tablet, Rfl: 12 .  triamcinolone cream (KENALOG) 0.1 %, APPLY TO AFFECTED  AREA(S) TWICE DAILY, Disp: 30 g, Rfl: 11 .  gabapentin (NEURONTIN) 600 MG tablet, Take 1/2 to 1 pill in the evenoing and 1/2 to 1 pill at night, Disp: 60 tablet, Rfl: 11  PAST MEDICAL HISTORY: Past Medical History:  Diagnosis Date  . Asthma   . Chronic back pain   . Chronic leg pain    Bilateral  . Chronic pelvic pain in female   . Essential hypertension   . Fibromyalgia   . H/O cardiac catheterization    No significant CAD (only 20% LAD) 03/02/13 with normal LV function.  Marland Kitchen Headache   . High cholesterol   . Hypothyroidism   . Mental disorder   . OAB (overactive bladder) 05/21/2013  . Obstructive sleep apnea    Noncompliant with CPAP due to claustophobia.  Marland Kitchen  Panic attacks   . Rectocele 05/21/2013  . Seizures (HCC)    1 severe seizure 2015; has had "small ones" since including 9/18  . Type 2 diabetes mellitus (HCC)     PAST SURGICAL HISTORY: Past Surgical History:  Procedure Laterality Date  . ABDOMINAL HYSTERECTOMY    . ANKLE SURGERY    . BACK SURGERY    . CARPAL TUNNEL RELEASE    . knee replacements     bilateral  . LEFT HEART CATHETERIZATION WITH CORONARY ANGIOGRAM N/A 03/02/2013   Procedure: LEFT HEART CATHETERIZATION WITH CORONARY ANGIOGRAM;  Surgeon: Peter M Swaziland, MD;  Location: [facility] CATH LAB;  Service: Cardiovascular;  Laterality: N/A;  . TONSILLECTOMY    . TUBAL LIGATION      FAMILY HISTORY: Family History  Problem Relation Age of Onset  . Heart attack Mother   . Diabetes Mother   . Hypertension Mother   . Sick sinus syndrome Brother        Pacemaker  . Congenital heart disease Brother   . Stroke Brother   . Coronary artery disease Brother   . Cancer Maternal Aunt        breast, liver,lung  . Cancer Cousin        leukemia    SOCIAL HISTORY:  Social History   Social History  . Marital status: Married    Spouse name: N/A  . Number of children: N/A  . Years of education: N/A   Occupational History  . disabled    Social History Main Topics  .  Smoking status: Former Smoker    Packs/day: 1.00    Years: 2.00    Types: Cigarettes    Quit date: 12/13/1974  . Smokeless tobacco: Never Used  . Alcohol use No  . Drug use: No  . Sexual activity: Not Currently    Birth control/ protection: Surgical     Comment: hyst   Other Topics Concern  . Not on file   Social History Narrative  . No narrative on file     PHYSICAL EXAM  Vitals:   09/13/17 0816  BP: 118/75  Pulse: (!) 59  Resp: 20  Weight: 247 lb (112 kg)  Height: 5\' 7"  (1.702 m)    Body mass index is 38.69 kg/m.   General: The patient is an obese woman in no acute distress  Eyes:  Funduscopic exam now shows normal optic discs.  Normal retinal vessels.   She has venous pulsations  Neck: The neck is supple, no carotid bruits are noted.  Her neck is very tender over the right occiput/occipital nerve. There is mild tenderness on the left and in the right cervical paraspinal muscles and trapezius.    Neurologic Exam  Mental status: The patient is alert and oriented x 3 at the time of the examination. The patient has apparent normal recent and remote memory, with an apparently normal attention span and concentration ability.   Speech is normal.  Cranial nerves: Extraocular movements are full. Pupils are equal, round, and reactive to light and accomodation.   Facial strength and sensation is normal. Trapezius strength is normal.    No obvious hearing deficits are noted.  Motor:  Muscle bulk is normal.   Tone is normal. Strength is  5 / 5 in all 4 extremities.   Sensory: Sensory testing is intact to touch and vibration in the hands. She has reduced vibration sensation in the toes..  Coordination: Cerebellar testing reveals good finger-nose-finger and heel-to-shin bilaterally.  Gait and station: Station is normal.   Gait is normal. Tandem gait is mildly wide.. Romberg is negative.   Reflexes: Deep tendon reflexes are symmetric and normal bilaterally.     \   DIAGNOSTIC DATA (LABS, IMAGING, TESTING) - I reviewed patient records, labs, notes, testing and imaging myself where available.  Lab Results  Component Value Date   WBC 6.2 09/04/2017   HGB 14.5 09/04/2017   HCT 43.0 09/04/2017   MCV 89.2 09/04/2017   PLT 214 09/04/2017      Component Value Date/Time   NA 140 09/04/2017 1524   K 3.8 09/04/2017 1524   CL 105 09/04/2017 1524   CO2 27 09/04/2017 1524   GLUCOSE 86 09/04/2017 1524   BUN 11 09/04/2017 1524   CREATININE 0.76 09/04/2017 1524   CALCIUM 9.1 09/04/2017 1524   PROT 7.8 09/04/2017 1524   ALBUMIN 4.4 09/04/2017 1524   AST 31 09/04/2017 1524   ALT 25 09/04/2017 1524   ALKPHOS 95 09/04/2017 1524   BILITOT 0.6 09/04/2017 1524   GFRNONAA >60 09/04/2017 1524   GFRAA >60 09/04/2017 1524   Lab Results  Component Value Date   CHOL 119 09/05/2017   HDL 40 (L) 09/05/2017   LDLCALC 65 09/05/2017   TRIG 69 09/05/2017   CHOLHDL 3.0 09/05/2017   Lab Results  Component Value Date   HGBA1C 5.3 09/04/2017      ASSESSMENT AND PLAN  Headache, occipital  Occipital neuralgia of right side  Seizure (HCC)  Neck pain  Obstructive sleep apnea  Insomnia, unspecified type  Restless leg   1.   Continue  Keppra 500 mg po bid.   She has not had any further seizures since starting Keppra.   She is ok to drive again. 2.   Right splenius capitis, C6-C7 paraspinal, trapezius trigger point injection with 80 mg Depo-Medrol in [place] using sterile technique.  She tolerated the procedure well with no complications. 3   She is advised to try to wear her CPAP nightly for at least 4 hours, preferably 6 hours. Additionally a prescription for gabapentin will be provided (300-600 mg nightly to help with her insomnia and possible restless legs. 4.   he will return to see me in 6 months or sooner if there are new or worsening neurologic symptoms.   Talena Neira A. Epimenio Foot, MD, PhD 09/13/2017, 9:17 AM Certified in Neurology, Clinical  Neurophysiology, Sleep Medicine, Pain Medicine and Neuroimaging  [facility] [address] [place], [place] [address] [phone]

## 2017-11-10 DIAGNOSIS — E782 Mixed hyperlipidemia: Secondary | ICD-10-CM | POA: Diagnosis not present

## 2017-11-10 DIAGNOSIS — E039 Hypothyroidism, unspecified: Secondary | ICD-10-CM | POA: Diagnosis not present

## 2017-11-10 DIAGNOSIS — E119 Type 2 diabetes mellitus without complications: Secondary | ICD-10-CM | POA: Diagnosis not present

## 2017-11-14 DIAGNOSIS — R945 Abnormal results of liver function studies: Secondary | ICD-10-CM | POA: Diagnosis not present

## 2017-11-14 DIAGNOSIS — E119 Type 2 diabetes mellitus without complications: Secondary | ICD-10-CM | POA: Diagnosis not present

## 2018-03-10 ENCOUNTER — Other Ambulatory Visit: Payer: Self-pay | Admitting: Pharmacist

## 2018-03-10 NOTE — Patient Outreach (Signed)
[facility]) Care Management  03/10/2018  REIS PIENTA 08-04-1956 [id number]  Late entry for 03/09/18.   Unsuccessful phone outreach to patient to discuss transition from [facility] Solutions medication adherence pilot ending.   Gentleman answered phone, no PHI exchanged, he reported patient not available.    HIPAA compliant message left requesting call.   Plan:  Continue to try to outreach patient.   [name], PharmD, [facility] Clinical Pharmacist [facility] [phone]

## 2018-03-10 NOTE — Patient Outreach (Signed)
[facility]) Care Management  03/10/2018  [name] Feb 17, 1956 [id number]   Unsuccessful phone outreach to patient to follow-up regarding [facility] Solutions medication adherence pilot ending.    Gentleman answered phone and reported patient not available.  No PHI was exchanged. Melton Krebs could be heard talking to a female in the background, reports it was his spouse.   Melton Krebs reported no other phone number to reach patient at---again, no PHI exchanged during call.    Phone number listed on [employer] for member is also [phone].    Will try to reach out to PCP for assistance reaching out to patient.   Plan:  Call placed to PCP, Dr Hughie Closs, office recording stated office was closed today.   Will make outreach to MD again next week.   [name], PharmD, [facility] Clinical Pharmacist [facility] [phone]

## 2018-03-13 ENCOUNTER — Other Ambulatory Visit: Payer: Self-pay | Admitting: Pharmacist

## 2018-03-13 NOTE — Patient Outreach (Signed)
[facility]) Care Management  03/13/2018  [name] 1956-04-12 [id number]  [facility] Pharmacist has been unsuccessful reaching patient regarding [facility] Solutions medication adherence pilot.    Placed call to PCP office, Dr [name], spoke with [name], medical assistant, to see if office staff could contact patient to make her aware [facility] Pharmacist has been trying contact her.   [name] called back to verify patient information and returned call to [facility] Pharmacist to report she spoke with patient to let patient know [facility] would be calling.   Unsuccessful phone outreach back to patient, HIPAA compliant message was left requesting a return call.    Plan:  Will continue to try to reach patient.    [name], PharmD, [facility] Clinical Pharmacist [facility] [phone]

## 2018-03-14 ENCOUNTER — Other Ambulatory Visit: Payer: Self-pay | Admitting: Pharmacist

## 2018-03-14 DIAGNOSIS — E119 Type 2 diabetes mellitus without complications: Secondary | ICD-10-CM | POA: Diagnosis not present

## 2018-03-14 DIAGNOSIS — G4733 Obstructive sleep apnea (adult) (pediatric): Secondary | ICD-10-CM | POA: Diagnosis not present

## 2018-03-14 DIAGNOSIS — F5101 Primary insomnia: Secondary | ICD-10-CM | POA: Diagnosis not present

## 2018-03-14 DIAGNOSIS — E039 Hypothyroidism, unspecified: Secondary | ICD-10-CM | POA: Diagnosis not present

## 2018-03-14 DIAGNOSIS — I10 Essential (primary) hypertension: Secondary | ICD-10-CM | POA: Diagnosis not present

## 2018-03-14 NOTE — Patient Outreach (Signed)
[facility]) Care Management  03/14/2018  [name] March 13, 1956 [id number]  Patient returned call to [facility] Pharmacist.  HIPAA details verified and purpose of call explained.    Patient confirms she has the [facility] Solutions medication device.  She reports she just started her most recent pack of medications 03/14/18 and will not be needing refill until about 04/11/18.    Discussed with patient this pilot project has unfortunately ended and [facility] Pharmacist would like to assist patient in transition from the device.    Discussed with patient her current pharmacy, [facility], can continue filling her medications and place prescriptions in "My [facility] Packs."  This places the medications in packets for each day and time of day to assist patient in medication management.  Also discussed [facility] Pharmacist could assist patient in finding another pharmacy of her choice or alternative packaging options if she prefers.   Patient reports she would like to stay with [facility] and use "My [employer]."  She reports with the [facility] Solutions device, it was difficult on days that she wasn't home and the machine would release the medication packet at a set time.  She believes the packaging option from [facility] will be convenient.   Discussed [facility] Pharmacist would contact [facility] and make [facility] aware patient would like "My [employer]."  Discussed with patient [facility] Pharmacist would contact her in the next 2-3 weeks to arrange pick-up of the [facility] Solutions device.   Placed call to [facility], spoke with [place].  Updated [place] patient would like to continue receiving her medications from [facility] and selects "My [employer]."  He confirms [facility] will continue to assist this patient.    Plan:  Will follow-up with patient towards the end of the month to arrange pick-up of [facility] Solutions device, either from patient home or MD  office if patient prefers.  [name], PharmD, [facility] Clinical Pharmacist [facility] [phone]

## 2018-03-14 NOTE — Patient Outreach (Signed)
[facility]) Care Management  03/14/2018  [name] Sep 09, 1956 [id number]  Received a message back from patient returning [facility] Pharmacist phone call.   Attempted to return call to patient, no answer, HIPAA compliant message left requesting return call.   Plan:  Continue to attempt patient outreach regarding [facility].   [name], PharmD, [facility] Clinical Pharmacist [facility] [phone]

## 2018-03-15 ENCOUNTER — Encounter: Payer: Self-pay | Admitting: Neurology

## 2018-03-15 ENCOUNTER — Ambulatory Visit: Payer: Medicare Other | Admitting: Neurology

## 2018-03-17 DIAGNOSIS — R945 Abnormal results of liver function studies: Secondary | ICD-10-CM | POA: Diagnosis not present

## 2018-03-17 DIAGNOSIS — I10 Essential (primary) hypertension: Secondary | ICD-10-CM | POA: Diagnosis not present

## 2018-03-17 DIAGNOSIS — E119 Type 2 diabetes mellitus without complications: Secondary | ICD-10-CM | POA: Diagnosis not present

## 2018-03-17 DIAGNOSIS — E782 Mixed hyperlipidemia: Secondary | ICD-10-CM | POA: Diagnosis not present

## 2018-04-05 ENCOUNTER — Ambulatory Visit: Payer: Medicare Other | Admitting: Podiatry

## 2018-04-13 ENCOUNTER — Ambulatory Visit: Payer: Medicare Other | Admitting: Neurology

## 2018-04-14 ENCOUNTER — Other Ambulatory Visit: Payer: Self-pay | Admitting: Pharmacist

## 2018-04-14 ENCOUNTER — Ambulatory Visit: Payer: Medicare Other | Admitting: Podiatry

## 2018-04-14 NOTE — Patient Outreach (Signed)
[facility]) Care Management  04/14/2018  [name] August 05, 1956 [id number]  Successful phone outreach to patient regarding [facility] Solutions device.  HIPAA details verified.  Patient reports device is empty but she has not received refills yet.  She reports she is unable to schedule a pick-up date for the [facility] Solutions device due to a family event she is working on today.   Call placed to [facility] with Lupita Leash, whom reports medications were shipped to patient on 04/11/18 in "My [facility]."  She confirms refills were sent to patient.   Placed call back to patient and updated her per [facility], her medications were shipped on 04/11/18 in "My [place] [employer]."  She was encouraged to watch her mail for this.     Plan:  Will outreach patient next week to schedule pick-up of [facility] Solutions device.   [name], PharmD, [place] [facility] Clinical Pharmacist [facility] [phone]

## 2018-04-17 ENCOUNTER — Other Ambulatory Visit: Payer: Self-pay | Admitting: Pharmacist

## 2018-04-17 MED FILL — levETIRAcetam 500 MG TABS: 500 | 30 days supply | Qty: 60 | Fill #0

## 2018-04-17 NOTE — Patient Outreach (Signed)
[facility]) Care Management  04/17/2018  [name] May 15, 1956 [id number]  Successful phone outreach to patient.  HIPAA details verified.  Patient reports she still did not receive her refills from [facility].  She had not contacted pharmacy yet to let them know she did not receive refills.   Placed call to [facility], left message for [place], PharmD to call back.  Received call from [facility], updated him [facility] Pharmacist was told last week medications were shipped 04/11/18 but patient reports not receiving.  Selena Batten was going to look into tracking information and call back.    Per phone call back to [facility] with [place], the address was wrong for the [facility] shipment [facility] sent---and [employer] will be unable to rectify until tomorrow.  Selena Batten stated he was going to call patient to let patient know what happened.   [facility] Pharmacist called patient back.  She reports she was updated what happened by [facility] and also [employer] had called her.  Discussed with patient option of getting her levetiracetam transferred to [facility], with use of pharmacy emergency fund, 14 day supply could be purchased, and delivered to her tonight.  She is appreciative of this.    [facility] back, spoke with Selena Batten and requested prescription for levetiracetam be transferred to [facility].  Called [facility], spoke with Brett Canales, and updated him to [facility] will purchase a 14 day supply cash price, of levetiracetam.    Plan:  Will assist patient in getting levetiracetam delivered to her residence tonight.    [name], PharmD, [facility] Clinical Pharmacist [facility] [phone]

## 2018-04-18 ENCOUNTER — Other Ambulatory Visit: Payer: Self-pay | Admitting: Pharmacist

## 2018-04-18 NOTE — Patient Outreach (Signed)
[facility]) Care Management  04/18/2018  [name] 1956/05/26 [id number]  Attempted to follow-up with patient regarding shipment of her refills from [facility] not available, no HIPAA details exchanged and HIPAA compliant message left requesting return call.   Returned call to patient this afternoon.  HIPAA details verified.  Patient confirms she received her medications from [facility] delivered by [facility] today.    She denies other needs at this time.  Discussed with patient her [facility] Solutions device is being shipped back to manufacturer.    Plan:  No further pharmacy follow-up needed at this time as she reports she has her medications now.  She was provided [facility] Pharmacist phone number if needed.    [name], PharmD, [facility] Clinical Pharmacist [facility] [phone]

## 2018-04-18 NOTE — Patient Outreach (Signed)
[facility]) Care Management  04/18/2018  KHAILA VELARDE 23-Jul-1956 [id number]  This is a late entry for 04/17/18.  Levetiracetam 500 mg #60 was picked up at [facility].  Medication was delivered to patient.  She verified name, date of birth, and address.  Patient was reminded it was her last refill on current prescription and she will need to request refills from her prescriber---she verbalized understanding.  She denies questions about levetiracetam at this time.   She reports [employer] told her they will attempt delivery of her medications from [facility] 04/18/18 now that address is fixed.    Also picked up [facility] Solution medication device.  Will work with [facility] Manager of ACO Operations to get machine shipped back to manufacturer.    Plan:  Will place follow-up call to patient 04/18/18 to verify if she received her other medications.    [name], PharmD, [facility] Clinical Pharmacist [facility] [phone]

## 2018-04-20 DIAGNOSIS — E119 Type 2 diabetes mellitus without complications: Secondary | ICD-10-CM | POA: Diagnosis not present

## 2018-04-20 DIAGNOSIS — H524 Presbyopia: Secondary | ICD-10-CM | POA: Diagnosis not present

## 2018-04-20 DIAGNOSIS — H52223 Regular astigmatism, bilateral: Secondary | ICD-10-CM | POA: Diagnosis not present

## 2018-04-20 DIAGNOSIS — H5203 Hypermetropia, bilateral: Secondary | ICD-10-CM | POA: Diagnosis not present

## 2018-05-01 ENCOUNTER — Other Ambulatory Visit: Payer: Self-pay | Admitting: Obstetrics & Gynecology

## 2018-05-01 ENCOUNTER — Other Ambulatory Visit: Payer: Self-pay | Admitting: Neurology

## 2018-05-05 ENCOUNTER — Encounter: Payer: Self-pay | Admitting: Podiatry

## 2018-05-05 ENCOUNTER — Ambulatory Visit (INDEPENDENT_AMBULATORY_CARE_PROVIDER_SITE_OTHER): Payer: Medicare Other | Admitting: Podiatry

## 2018-05-05 ENCOUNTER — Other Ambulatory Visit: Payer: Self-pay

## 2018-05-05 ENCOUNTER — Ambulatory Visit (INDEPENDENT_AMBULATORY_CARE_PROVIDER_SITE_OTHER): Payer: Medicare Other | Admitting: Adult Health

## 2018-05-05 ENCOUNTER — Encounter: Payer: Self-pay | Admitting: Adult Health

## 2018-05-05 VITALS — BP 106/62 | HR 60 | Resp 16

## 2018-05-05 VITALS — BP 110/80 | HR 97 | Ht 67.0 in | Wt 255.0 lb

## 2018-05-05 DIAGNOSIS — N644 Mastodynia: Secondary | ICD-10-CM | POA: Diagnosis not present

## 2018-05-05 DIAGNOSIS — B369 Superficial mycosis, unspecified: Secondary | ICD-10-CM | POA: Diagnosis not present

## 2018-05-05 DIAGNOSIS — M779 Enthesopathy, unspecified: Secondary | ICD-10-CM | POA: Diagnosis not present

## 2018-05-05 DIAGNOSIS — Z01411 Encounter for gynecological examination (general) (routine) with abnormal findings: Secondary | ICD-10-CM | POA: Diagnosis not present

## 2018-05-05 DIAGNOSIS — Z1212 Encounter for screening for malignant neoplasm of rectum: Secondary | ICD-10-CM | POA: Diagnosis not present

## 2018-05-05 DIAGNOSIS — L84 Corns and callosities: Secondary | ICD-10-CM

## 2018-05-05 DIAGNOSIS — Z01419 Encounter for gynecological examination (general) (routine) without abnormal findings: Secondary | ICD-10-CM | POA: Insufficient documentation

## 2018-05-05 DIAGNOSIS — N393 Stress incontinence (female) (male): Secondary | ICD-10-CM

## 2018-05-05 DIAGNOSIS — Z1211 Encounter for screening for malignant neoplasm of colon: Secondary | ICD-10-CM | POA: Diagnosis not present

## 2018-05-05 LAB — HEMOCCULT GUIAC POC 1CARD (OFFICE): Fecal Occult Blood, POC: NEGATIVE

## 2018-05-05 MED ORDER — TRIAMCINOLONE ACETONIDE 10 MG/ML IJ SUSP
10.0000 mg | Freq: Once | INTRAMUSCULAR | Status: AC
  Administered 2018-05-05: 10 mg

## 2018-05-05 NOTE — Progress Notes (Signed)
Patient ID: [name], female   DOB: 13-Mar-1956, 62 y.o.   MRN: [id number] History of Present Illness: [name] is a 62 year old white female, married, sp hysterectomy, in for a well woman gyn exam.She is retired but fosters 63 yo little boy, and has daughter and 2 grandchildren that live with her. PCP is Dr hall.   Current Medications, Allergies, Past Medical History, Past Surgical History, Family History and Social History were reviewed in [employer] record.     Review of Systems: Patient denies any headaches, hearing loss, fatigue, blurred vision, shortness of breath, chest pain, abdominal pain, problems with bowel movements,  or intercourse(not currently active). No joint pain or mood swings. +SUI    Physical Exam:BP 110/80 (BP Location: Left Arm, Patient Position: Sitting, Cuff Size: Large)   Pulse 97   Ht 5\' 7"  (1.702 m)   Wt 255 lb (115.7 kg)   BMI 39.94 kg/m  General:  Well developed, well nourished, no acute distress Skin:  Warm and dry Neck:  Midline trachea, normal thyroid, good ROM, no lymphadenopathy,no carotid bruits heard Lungs; Clear to auscultation bilaterally Breast:  No dominant palpable mass, retraction, or nipple discharge,but right under arm area is tender and has thickness, seems to be part of breast tissue Cardiovascular: Regular rate and rhythm Abdomen:  Soft, non tender, no hepatosplenomegaly.ksin fungus at navel  Pelvic:  External genitalia is normal in appearance, no lesions.  The vagina is normal in appearance. Urethra has no lesions or masses. The cervix and uterus are absent. No adnexal masses or tenderness noted.Bladder is non tender, no masses felt. Rectal: Good sphincter tone, no polyps, or hemorrhoids felt.  Hemoccult negative. Extremities/musculoskeletal:  No swelling or varicosities noted, no clubbing or cyanosis Psych:  No mood changes, alert and cooperative,seems happy PHQ 9 score 8, is on meds, and denies being  suicidal.  Impression: 1. Encounter for well woman exam with routine gynecological exam   2. Screening for colorectal cancer   3. Superficial fungus infection of skin   4. SUI (stress urinary incontinence, female)   5. Breast pain, right       Plan: Mammogram scheduled for 6/4 at 3:20 pm at [facility] Orders Placed This Encounter  Procedures  . [facility] BREAST LTD UNI RIGHT INC AXILLA  . MM DIAG BREAST TOMO BILATERAL  . US BREAST LTD UNI LEFT INC AXILLA  . POCT occult blood stool  Has mycolog cream at home for skin fungus at navel Physical in 1 year Labs with PCP

## 2018-05-08 NOTE — Progress Notes (Signed)
Subjective:   Patient ID: [name], female   DOB: 62 y.o.   MRN: [id number]   HPI Patient presents stating that she has bad calluses on the bottom of both feet that attempted trimming was done but not effectively and she is in relatively poor health.  Patient no longer smokes and is not very active currently   Review of Systems  All other systems reviewed and are negative.       Objective:  Physical Exam  Constitutional: She appears well-developed and well-nourished.  Cardiovascular: Intact distal pulses.  Pulmonary/Chest: Effort normal.  Musculoskeletal: Normal range of motion.  Neurological: She is alert.  Skin: Skin is warm.  Nursing note and vitals reviewed.   Neurovascular status was found to be moderately diminished as far as pulses but intact with patient having history of osteomyelitis diabetes not in good control and morbid obesity which is complicating factor.  Patient is found to have thickened keratotic lesions sub-second metatarsal bilateral fifth metatarsal bilateral     Assessment:  Poor health individual with chronic plantar callus formation and at wrist diabetes with diminishment of both circulatory and neurological status     Plan:  H&P x-rays reviewed and today debridement accomplished with no iatrogenic bleeding.  Patient will be seen back for routine care and may require diabetic shoes or other types of inserts to try to reduce pressure  X-rays indicate that there is moderate cavus foot structure with osteoporosis and no other signs of pathology except for moderate arthritis

## 2018-05-16 ENCOUNTER — Ambulatory Visit (HOSPITAL_COMMUNITY)
Admission: RE | Admit: 2018-05-16 | Discharge: 2018-05-16 | Disposition: A | Discharge status: Home / Self Care | Payer: Medicare Other | Source: Ambulatory Visit | Attending: Adult Health | Admitting: Adult Health

## 2018-05-16 ENCOUNTER — Encounter (HOSPITAL_COMMUNITY): Payer: Self-pay

## 2018-05-16 DIAGNOSIS — N644 Mastodynia: Secondary | ICD-10-CM | POA: Insufficient documentation

## 2018-05-16 DIAGNOSIS — R928 Other abnormal and inconclusive findings on diagnostic imaging of breast: Secondary | ICD-10-CM | POA: Diagnosis not present

## 2018-05-22 ENCOUNTER — Ambulatory Visit: Payer: Medicare Other | Admitting: Nurse Practitioner

## 2018-05-22 ENCOUNTER — Telehealth: Payer: Self-pay | Admitting: Nurse Practitioner

## 2018-05-22 NOTE — Telephone Encounter (Signed)
Mrs. Crowl is a Dr. Epimenio Foot patient and needs to be scheduled w/ Dr. Epimenio Foot from now on for trigger point injections. Patient was seen by Daphine Deutscher, NP one time due to Dr. Epimenio Foot being out of the office.

## 2018-05-23 ENCOUNTER — Encounter: Payer: Self-pay | Admitting: Nurse Practitioner

## 2018-05-23 ENCOUNTER — Telehealth: Payer: Self-pay | Admitting: *Deleted

## 2018-05-23 NOTE — Telephone Encounter (Signed)
Pt no showed her appt 05-22-18.

## 2018-05-24 ENCOUNTER — Ambulatory Visit (INDEPENDENT_AMBULATORY_CARE_PROVIDER_SITE_OTHER): Payer: Medicare Other | Admitting: Neurology

## 2018-05-24 ENCOUNTER — Encounter: Payer: Self-pay | Admitting: Neurology

## 2018-05-24 ENCOUNTER — Other Ambulatory Visit: Payer: Self-pay

## 2018-05-24 VITALS — BP 135/90 | HR 61 | Resp 20 | Ht 67.0 in | Wt 253.0 lb

## 2018-05-24 DIAGNOSIS — G40309 Generalized idiopathic epilepsy and epileptic syndromes, not intractable, without status epilepticus: Secondary | ICD-10-CM | POA: Diagnosis not present

## 2018-05-24 DIAGNOSIS — G2581 Restless legs syndrome: Secondary | ICD-10-CM

## 2018-05-24 DIAGNOSIS — M542 Cervicalgia: Secondary | ICD-10-CM

## 2018-05-24 DIAGNOSIS — R51 Headache: Secondary | ICD-10-CM | POA: Diagnosis not present

## 2018-05-24 DIAGNOSIS — M5481 Occipital neuralgia: Secondary | ICD-10-CM | POA: Diagnosis not present

## 2018-05-24 DIAGNOSIS — G4733 Obstructive sleep apnea (adult) (pediatric): Secondary | ICD-10-CM

## 2018-05-24 DIAGNOSIS — R519 Headache, unspecified: Secondary | ICD-10-CM

## 2018-05-24 MED ORDER — LEVETIRACETAM 500 MG PO TABS: 500.0000 mg | ORAL_TABLET | Freq: Two times a day (BID) | ORAL | 11 refills | Status: DC

## 2018-05-24 NOTE — Progress Notes (Signed)
[facility]  PATIENT: [name] DOB: October 31, 1956  REFERRING DOCTOR OR PCP:  Shelva Majestic SOURCE: patient and records form EMR and Dr. [name]  _________________________________   HISTORICAL  CHIEF COMPLAINT:  Chief Complaint  Patient presents with  . Seizures    Denies sz. since 2016. Sts. is compliant with CPAP but does not have card for dl. Sts. H/a's have been worse and requests tpi today if appropriate/fim  . Sleep Apnea  . Neck Pain  . Headache    HISTORY OF PRESENT ILLNESS:  [name] is a 62 year old woman with a h/o a generalized tonic-clonic seizure in 2016  with headaches and obstructive sleep apnea.  Update 05/24/2018: She is noting right sided occipital pain and headache.   Pain is better laying down with a pillow under her neck.    Pain is constant with superimposed needle-like sensation.    She denies N/V.  She has some photophobia and phonophobia.   Ibuprofen takes the edge off for a couple hours.   She had an MRI of the brain and MRI of the cervical spine that were read as normal for age. Specifically the cervical MRI just shows a C6C7 bulge.     She has OSA and uses CPAP nightly.   Sometimes she takes it off if her grandson sleeps with her since he doesn't like her to wear it.  She will be getting permanent custody of her grandson.     She has not had any seizures since 2016.   She is on Keppra and tolerates it well.    Update 09/13/2017:   She has had a mil daily headache x one month or more but much more severe the last week.    HA is right sided and throbbing.  She has no N/V.   She has photophobia > phonophobia.    Moving the head worsens the pain when more intense.    Ibuprofen takes the edge off for a few hours and then HA returns more intensely.    She went to the ED 09/04/2017 with ingtense HA and chest pain.    NTG w as given which worsened the HA.  She was admitted x 1 day and was given toradol, lorazepam, morphine and Zofran.  She  had an MRI of the brain and MRI of the cervical spine that were read as normal for age. Specifically the cervical MRI just shows a C6C7 bulge.     She has only worn CPAP intermittently and we discussed trying to wear it the entire night.     She notes insomnia, sleep onset and sleep maintenance issues.   She laso has to move  She has not had any seizures since 2016   _____________________________________________________- From 05/19/2016 Seizure: She denies any seizures since the last visit.   In August 2015, she had generalized tonic-clonic seizure activity that was witnessed by her husband. She has had no prior seizures and none since.   She continues is on Keppra.  An MRI of the brain at that time was essentially normal. An EEG was performed and showed 2 runs of generalized spike wave activity, consistent with a generalized seizure disorder. She is unaware of any family history of seizures.    She has not had head trauma in the past. She had a normal childhood development. She has not had any meningitis or encephalitis.  Pseudotumor cerebri/headache and neck pain:   Due to headaches and papilledema, she underwent a LP.  CSF pressure was elevated At 26.5 cm..   She had a lot of bladder issues on Diamox and stopped.   Headaches are mostly occipital.  However, headaches never returned to the severity or frequency that they were before the lumbar puncture. The HA occurs 1 -2 times a week.  Ibuprofen often knocks the headache out, especially if she lays down.   Her blurry vision is better.    A recent eye exam reportedly just showed cataract.   Lightheadedness with standing up is also better.    She reports that her ophthalmologist told her that the optic nerve changes were due to cataracts.  Obstructive sleep apnea: About 4 or 5 years ago she was diagnosed with OSA with an overnight sleep study. She was started on CPAP +6 cm in the past.    However, she had trouble tolerating the mask and stopped.   She is  claustophobic so seldom made it through the night with CPAP.    She reports some fatigue and daytime sleepiness.     REVIEW OF SYSTEMS: Constitutional: No fevers, chills, sweats, or change in appetite.   She notes some fatigue and sleepiness. Eyes: cataracts.    Improved blurriness Ear, nose and throat: No hearing loss, ear pain, nasal congestion, sore throat Cardiovascular: No chest pain, palpitations Respiratory: No shortness of breath at rest or with exertion.   Some cough and wheezes GastrointestinaI: No nausea, vomiting, diarrhea, abdominal pain, fecal incontinence Genitourinary: No dysuria, urinary retention or frequency.  No nocturia. Musculoskeletal: No neck pain, back pain Integumentary: No rash, pruritus, skin lesions Neurological: as above Psychiatric: No depression at this time.  No anxiety.  Notes increased stress at times. Endocrine: No palpitations, diaphoresis, change in appetite, change in weigh or increased thirst Hematologic/Lymphatic: No anemia, purpura, petechiae. Allergic/Immunologic: No itchy/runny eyes, nasal congestion, recent allergic reactions, rashes  ALLERGIES: Allergies  Allergen Reactions  . Aspirin Nausea And Vomiting  . Codeine Rash  . Latex Rash    HOME MEDICATIONS:  Current Outpatient Medications:  .  albuterol (PROVENTIL HFA;VENTOLIN HFA) 108 (90 Base) MCG/ACT inhaler, Inhale 1-2 puffs into the lungs every 6 (six) hours as needed for wheezing or shortness of breath., Disp: , Rfl:  .  atorvastatin (LIPITOR) 40 MG tablet, Take 40 mg by mouth at bedtime. , Disp: , Rfl:  .  clobetasol (TEMOVATE) 0.05 % external solution, apply TO SCALP TWICE DAILY AS NEEDED. USE for 1-2 WEEKS AT A TIME, Disp: , Rfl: 2 .  Fluocinolone Acetonide Scalp 0.01 % OIL, Apply 1 application topically daily as needed., Disp: , Rfl:  .  gabapentin (NEURONTIN) 600 MG tablet, Take 1/2 to 1 pill in the evenoing and 1/2 to 1 pill at night, Disp: 60 tablet, Rfl: 11 .  ibuprofen  (ADVIL,MOTRIN) 200 MG tablet, Take 400 mg by mouth as needed., Disp: , Rfl:  .  imipramine (TOFRANIL) 50 MG tablet, TAKE ONE TABLET BY MOUTH EVERY NIGHT AT BEDTIME, Disp: 30 tablet, Rfl: 11 .  levETIRAcetam (KEPPRA) 500 MG tablet, Take 1 tablet (500 mg total) by mouth 2 (two) times daily., Disp: 60 tablet, Rfl: 11 .  levothyroxine (SYNTHROID, LEVOTHROID) 100 MCG tablet, Take 100 mcg by mouth daily., Disp: , Rfl:  .  metFORMIN (GLUCOPHAGE) 500 MG tablet, Take 500 mg by mouth 2 (two) times daily with a meal. , Disp: , Rfl:  .  metoprolol (LOPRESSOR) 50 MG tablet, Take 50 mg by mouth 2 (two) times daily.  , Disp: , Rfl:  .  nystatin cream (MYCOSTATIN), APPLY TO AFFECTED AREA(S) TWICE DAILY, Disp: 30 g, Rfl: 11 .  nystatin-triamcinolone (MYCOLOG II) cream, Apply 1 application topically 2 (two) times daily., Disp: 30 g, Rfl: 2 .  oxybutynin (DITROPAN) 5 MG tablet, TAKE TWO TABLETS BY MOUTH EVERY MORNING and TAKE ONE TABLET BY MOUTH AT NOON and TAKE ONE TABLET BY MOUTH EVERY NIGHT AT BEDTIME, Disp: 120 tablet, Rfl: 12 .  triamcinolone cream (KENALOG) 0.1 %, APPLY TO AFFECTED AREA(S) TWICE DAILY, Disp: 30 g, Rfl: 11  PAST MEDICAL HISTORY: Past Medical History:  Diagnosis Date  . Asthma   . Chronic back pain   . Chronic leg pain    Bilateral  . Chronic pelvic pain in female   . Essential hypertension   . Fibromyalgia   . H/O cardiac catheterization    No significant CAD (only 20% LAD) 03/02/13 with normal LV function.  Marland Kitchen Headache   . High cholesterol   . Hypothyroidism   . Mental disorder   . OAB (overactive bladder) 05/21/2013  . Obstructive sleep apnea    Noncompliant with CPAP due to claustophobia.  . Panic attacks   . Rectocele 05/21/2013  . Seizures (HCC)    1 severe seizure 2015; has had "small ones" since including 9/18  . Type 2 diabetes mellitus (HCC)     PAST SURGICAL HISTORY: Past Surgical History:  Procedure Laterality Date  . ABDOMINAL HYSTERECTOMY    . ANKLE SURGERY    .  BACK SURGERY    . CARPAL TUNNEL RELEASE    . knee replacements     bilateral  . LEFT HEART CATHETERIZATION WITH CORONARY ANGIOGRAM N/A 03/02/2013   Procedure: LEFT HEART CATHETERIZATION WITH CORONARY ANGIOGRAM;  Surgeon: Peter M Swaziland, MD;  Location: [facility] CATH LAB;  Service: Cardiovascular;  Laterality: N/A;  . TONSILLECTOMY    . TUBAL LIGATION      FAMILY HISTORY: Family History  Problem Relation Age of Onset  . Heart attack Mother   . Diabetes Mother   . Hypertension Mother   . Sick sinus syndrome Brother        Pacemaker  . Congenital heart disease Brother   . Stroke Brother   . Coronary artery disease Brother   . Breast cancer Maternal Aunt   . Cancer Cousin        leukemia    SOCIAL HISTORY:  Social History   Socioeconomic History  . Marital status: Married    Spouse name: Not on file  . Number of children: Not on file  . Years of education: Not on file  . Highest education level: Not on file  Occupational History  . Occupation: disabled  Social Needs  . Financial resource strain: Not on file  . Food insecurity:    Worry: Not on file    Inability: Not on file  . Transportation needs:    Medical: Not on file    Non-medical: Not on file  Tobacco Use  . Smoking status: Former Smoker    Packs/day: 1.00    Years: 2.00    Pack years: 2.00    Types: Cigarettes    Last attempt to quit: 12/13/1974    Years since quitting: 43.4  . Smokeless tobacco: Never Used  Substance and Sexual Activity  . Alcohol use: No  . Drug use: No  . Sexual activity: Not Currently    Birth control/protection: Surgical    Comment: hyst  Lifestyle  . Physical activity:    Days per week:  Not on file    Minutes per session: Not on file  . Stress: Not on file  Relationships  . Social connections:    Talks on phone: Not on file    Gets together: Not on file    Attends religious service: Not on file    Active member of club or organization: Not on file    Attends meetings of clubs or  organizations: Not on file    Relationship status: Not on file  . Intimate partner violence:    Fear of current or ex partner: Not on file    Emotionally abused: Not on file    Physically abused: Not on file    Forced sexual activity: Not on file  Other Topics Concern  . Not on file  Social History Narrative  . Not on file     PHYSICAL EXAM  Vitals:   05/24/18 0958  BP: 135/90  Pulse: 61  Resp: 20  Weight: 253 lb (114.8 kg)  Height: 5\' 7"  (1.702 m)    Body mass index is 39.63 kg/m.   General: The patient is an obese woman in no acute distress  Eyes:  Funduscopic exam now shows normal optic discs.  Normal retinal vessels.   She has venous pulsations  Neck: The neck is supple, no carotid bruits are noted.  Her neck is very tender over the right occiput/occipital nerve. There is mild tenderness on the left and in the right cervical paraspinal muscles and trapezius.    Neurologic Exam  Mental status: The patient is alert and oriented x 3 at the time of the examination. The patient has apparent normal recent and remote memory, with an apparently normal attention span and concentration ability.   Speech is normal.  Cranial nerves: Extraocular movements are full. Pupils are equal, round, and reactive to light and accomodation.   Facial strength and sensation is normal. Trapezius strength is normal.    No obvious hearing deficits are noted.  Motor:  Muscle bulk is normal.   Tone is normal. Strength is  5 / 5 in all 4 extremities.   Sensory: Sensory testing is intact to touch and vibration in the hands. She has reduced vibration sensation in the toes..  Coordination: Cerebellar testing reveals good finger-nose-finger and heel-to-shin bilaterally.  Gait and station: Station is normal.   Gait is normal. Tandem gait is mildly wide.. Romberg is negative.   Reflexes: Deep tendon reflexes are symmetric and normal bilaterally.    \   DIAGNOSTIC DATA (LABS, IMAGING, TESTING) - I  reviewed patient records, labs, notes, testing and imaging myself where available.  Lab Results  Component Value Date   WBC 6.2 09/04/2017   HGB 14.5 09/04/2017   HCT 43.0 09/04/2017   MCV 89.2 09/04/2017   PLT 214 09/04/2017      Component Value Date/Time   NA 140 09/04/2017 1524   K 3.8 09/04/2017 1524   CL 105 09/04/2017 1524   CO2 27 09/04/2017 1524   GLUCOSE 86 09/04/2017 1524   BUN 11 09/04/2017 1524   CREATININE 0.76 09/04/2017 1524   CALCIUM 9.1 09/04/2017 1524   PROT 7.8 09/04/2017 1524   ALBUMIN 4.4 09/04/2017 1524   AST 31 09/04/2017 1524   ALT 25 09/04/2017 1524   ALKPHOS 95 09/04/2017 1524   BILITOT 0.6 09/04/2017 1524   GFRNONAA >60 09/04/2017 1524   GFRAA >60 09/04/2017 1524   Lab Results  Component Value Date   CHOL 119 09/05/2017  HDL 40 (L) 09/05/2017   LDLCALC 65 09/05/2017   TRIG 69 09/05/2017   CHOLHDL 3.0 09/05/2017   Lab Results  Component Value Date   HGBA1C 5.3 09/04/2017      ASSESSMENT AND PLAN  Epilepsy, generalized, convulsive (HCC)  Obstructive sleep apnea  Headache, occipital  Neck pain  Occipital neuralgia of right side  Restless leg   1.   Continue Keppra 500 mg p.o. twice daily for her seizures.  This may also help the headaches some.. 2.   Right splenius capitis trigger point injection with 80 mg Depo-Medrol and 3 cc Marcaine using sterile technique.  She tolerated the procedure well with no complications. 3   try to wear CPAP nightly.  If restless leg syndrome worsens again, she can get back on the gabapentin. 4.   she will return to see me in 6 months or sooner if there are new or worsening neurologic symptoms.   Richard A. Epimenio Foot, MD, PhD 05/24/2018, 10:12 AM Certified in Neurology, Clinical Neurophysiology, Sleep Medicine, Pain Medicine and Neuroimaging  [facility] [address] [place], [place] [address] [phone]

## 2018-06-16 DIAGNOSIS — E119 Type 2 diabetes mellitus without complications: Secondary | ICD-10-CM | POA: Diagnosis not present

## 2018-06-16 DIAGNOSIS — I10 Essential (primary) hypertension: Secondary | ICD-10-CM | POA: Diagnosis not present

## 2018-06-16 DIAGNOSIS — R252 Cramp and spasm: Secondary | ICD-10-CM | POA: Diagnosis not present

## 2018-06-16 DIAGNOSIS — L84 Corns and callosities: Secondary | ICD-10-CM | POA: Diagnosis not present

## 2018-06-21 DIAGNOSIS — R945 Abnormal results of liver function studies: Secondary | ICD-10-CM | POA: Diagnosis not present

## 2018-06-21 DIAGNOSIS — I10 Essential (primary) hypertension: Secondary | ICD-10-CM | POA: Diagnosis not present

## 2018-06-21 DIAGNOSIS — E039 Hypothyroidism, unspecified: Secondary | ICD-10-CM | POA: Diagnosis not present

## 2018-06-21 DIAGNOSIS — E782 Mixed hyperlipidemia: Secondary | ICD-10-CM | POA: Diagnosis not present

## 2018-06-21 DIAGNOSIS — E1165 Type 2 diabetes mellitus with hyperglycemia: Secondary | ICD-10-CM | POA: Diagnosis not present

## 2018-07-11 ENCOUNTER — Encounter (HOSPITAL_COMMUNITY): Payer: Self-pay | Admitting: Emergency Medicine

## 2018-07-11 ENCOUNTER — Other Ambulatory Visit: Payer: Self-pay

## 2018-07-11 ENCOUNTER — Emergency Department (HOSPITAL_COMMUNITY)
Admission: EM | Admit: 2018-07-11 | Discharge: 2018-07-12 | Disposition: A | Discharge status: Home / Self Care | Payer: Medicare Other | Attending: Emergency Medicine | Admitting: Emergency Medicine

## 2018-07-11 ENCOUNTER — Emergency Department (HOSPITAL_COMMUNITY): Payer: Medicare Other

## 2018-07-11 DIAGNOSIS — I10 Essential (primary) hypertension: Secondary | ICD-10-CM | POA: Diagnosis not present

## 2018-07-11 DIAGNOSIS — R51 Headache: Secondary | ICD-10-CM | POA: Insufficient documentation

## 2018-07-11 DIAGNOSIS — Z79899 Other long term (current) drug therapy: Secondary | ICD-10-CM | POA: Insufficient documentation

## 2018-07-11 DIAGNOSIS — E119 Type 2 diabetes mellitus without complications: Secondary | ICD-10-CM | POA: Insufficient documentation

## 2018-07-11 DIAGNOSIS — J45909 Unspecified asthma, uncomplicated: Secondary | ICD-10-CM | POA: Diagnosis not present

## 2018-07-11 DIAGNOSIS — R519 Headache, unspecified: Secondary | ICD-10-CM

## 2018-07-11 DIAGNOSIS — Z7984 Long term (current) use of oral hypoglycemic drugs: Secondary | ICD-10-CM | POA: Insufficient documentation

## 2018-07-11 DIAGNOSIS — R079 Chest pain, unspecified: Secondary | ICD-10-CM | POA: Diagnosis not present

## 2018-07-11 DIAGNOSIS — E039 Hypothyroidism, unspecified: Secondary | ICD-10-CM | POA: Insufficient documentation

## 2018-07-11 DIAGNOSIS — R0789 Other chest pain: Secondary | ICD-10-CM | POA: Diagnosis not present

## 2018-07-11 DIAGNOSIS — Z87891 Personal history of nicotine dependence: Secondary | ICD-10-CM | POA: Insufficient documentation

## 2018-07-11 LAB — CBC
HCT: 44.1 % (ref 36.0–46.0)
Hemoglobin: 14.5 g/dL (ref 12.0–15.0)
MCH: 29.8 pg (ref 26.0–34.0)
MCHC: 32.9 g/dL (ref 30.0–36.0)
MCV: 90.7 fL (ref 78.0–100.0)
PLATELETS: 240 10*3/uL (ref 150–400)
RBC: 4.86 MIL/uL (ref 3.87–5.11)
RDW: 12.7 % (ref 11.5–15.5)
WBC: 8 10*3/uL (ref 4.0–10.5)

## 2018-07-11 LAB — BASIC METABOLIC PANEL
ANION GAP: 9 (ref 5–15)
BUN: 17 mg/dL (ref 8–23)
CHLORIDE: 107 mmol/L (ref 98–111)
CO2: 26 mmol/L (ref 22–32)
Calcium: 9.1 mg/dL (ref 8.9–10.3)
Creatinine, Ser: 0.91 mg/dL (ref 0.44–1.00)
GFR calc Af Amer: >60 mL/min (ref >60)
GLUCOSE: 136 mg/dL — AB (ref 70–99)
POTASSIUM: 3.9 mmol/L (ref 3.5–5.1)
SODIUM: 142 mmol/L (ref 135–145)

## 2018-07-11 LAB — TROPONIN I

## 2018-07-11 MED ORDER — SODIUM CHLORIDE 0.9 % IV BOLUS
500.0000 mL | Freq: Once | INTRAVENOUS | Status: AC
  Administered 2018-07-11: 500 mL via INTRAVENOUS

## 2018-07-11 MED ORDER — PROMETHAZINE HCL 25 MG/ML IJ SOLN
12.5000 mg | Freq: Once | INTRAMUSCULAR | Status: AC
  Administered 2018-07-11: 12.5 mg via INTRAVENOUS
  Filled 2018-07-11: qty 1

## 2018-07-11 NOTE — ED Triage Notes (Signed)
Pt c/o severe headache x 2 days, chest pain that started today and left arm weakness that started 2 hours ago. Pt states she feel like she is going to pass out or have a seizure.

## 2018-07-12 NOTE — ED Provider Notes (Signed)
[name] Provider Note   CSN: [id number] Arrival date & time: 07/11/18  2015     History   Chief Complaint Chief Complaint  Patient presents with  . Chest Pain    HPI [name] is a 62 y.o. female.  HPI Patient presents with headache for 2 days.  Is in the back of her head on the right side and goes up to the top.  History of frequent headaches.  This is somewhat typical to her headaches.  States her left arm did feel a little numb.  Feels a little dizzy to.  No relief with medicines at home.  Has had previous spinal tap for the headache.  Also has had injections in the muscles. Sees Dr Epimenio Foot from neurology.  Still able to move left arm.  States feels a little numb.  Patient states she has had some double vision today 2.  States that is double with both eyes. Past Medical History:  Diagnosis Date  . Asthma   . Chronic back pain   . Chronic leg pain    Bilateral  . Chronic pelvic pain in female   . Essential hypertension   . Fibromyalgia   . H/O cardiac catheterization    No significant CAD (only 20% LAD) 03/02/13 with normal LV function.  Marland Kitchen Headache   . High cholesterol   . Hypothyroidism   . Mental disorder   . OAB (overactive bladder) 05/21/2013  . Obstructive sleep apnea    Noncompliant with CPAP due to claustophobia.  . Panic attacks   . Rectocele 05/21/2013  . Seizures (HCC)    1 severe seizure 2015; has had "small ones" since including 9/18  . Type 2 diabetes mellitus [facility])     Patient Active Problem List   Diagnosis Date Noted  . SUI (stress urinary incontinence, female) 05/05/2018  . Screening for colorectal cancer 05/05/2018  . Encounter for well woman exam with routine gynecological exam 05/05/2018  . Breast pain, right 05/05/2018  . Insomnia 09/13/2017  . Restless leg 09/13/2017  . Superficial fungus infection of skin 05/04/2017  . Bruising 10/14/2016  . Chest pain 10/04/2016  . Hypothyroidism 10/04/2016  . Idiopathic  intracranial hypertension 11/19/2015  . Papilledema 09/08/2015  . Left facial numbness 08/22/2015  . Epilepsy, generalized, convulsive (HCC) 06/05/2015  . Obstructive sleep apnea 06/05/2015  . Headache, occipital 06/05/2015  . Neck pain 06/05/2015  . Occipital neuralgia of right side 06/05/2015  . Seizure (HCC) 07/21/2014  . Rectocele 05/21/2013  . OAB (overactive bladder) 05/21/2013  . Difficulty in walking(719.7) 04/11/2013  . Leg weakness, bilateral 04/11/2013  . Diabetes (HCC) 02/28/2013  . Morbid obesity (HCC) 02/28/2013  . Hyperlipidemia 02/28/2013  . CHRONIC OSTEOMYELITIS ANKLE AND FOOT 07/22/2009  . Hypertension 07/22/2009    Past Surgical History:  Procedure Laterality Date  . ABDOMINAL HYSTERECTOMY    . ANKLE SURGERY    . BACK SURGERY    . CARPAL TUNNEL RELEASE    . knee replacements     bilateral  . LEFT HEART CATHETERIZATION WITH CORONARY ANGIOGRAM N/A 03/02/2013   Procedure: LEFT HEART CATHETERIZATION WITH CORONARY ANGIOGRAM;  Surgeon: Peter M Swaziland, MD;  Location: [facility] CATH LAB;  Service: Cardiovascular;  Laterality: N/A;  . TONSILLECTOMY    . TUBAL LIGATION       OB History    Gravida  2   Para  2   Term  2   Preterm      AB  Living  2     SAB      TAB      Ectopic      Multiple      Live Births  2            Home Medications    Prior to Admission medications   Medication Sig Start Date End Date Taking? Authorizing Provider  albuterol (PROVENTIL HFA;VENTOLIN HFA) 108 (90 Base) MCG/ACT inhaler Inhale 1-2 puffs into the lungs every 6 (six) hours as needed for wheezing or shortness of breath.   Yes [provider]  atorvastatin (LIPITOR) 40 MG tablet Take 40 mg by mouth at bedtime.  10/15/14  Yes [provider]  clobetasol (TEMOVATE) 0.05 % external solution apply TO SCALP TWICE DAILY AS NEEDED. USE for 1-2 WEEKS AT A TIME 04/04/18   [provider]  Fluocinolone Acetonide Scalp 0.01 % OIL Apply 1  application topically daily as needed.    [provider]  gabapentin (NEURONTIN) 600 MG tablet Take 1/2 to 1 pill in the evenoing and 1/2 to 1 pill at night 09/13/17   Sater, Pearletha Furl, MD  ibuprofen (ADVIL,MOTRIN) 200 MG tablet Take 400 mg by mouth as needed.    [provider]  imipramine (TOFRANIL) 50 MG tablet TAKE ONE TABLET BY MOUTH EVERY NIGHT AT BEDTIME 05/02/18   Lazaro Arms, MD  levETIRAcetam (KEPPRA) 500 MG tablet Take 1 tablet (500 mg total) by mouth 2 (two) times daily. 05/24/18   Sater, Pearletha Furl, MD  levothyroxine (SYNTHROID, LEVOTHROID) 100 MCG tablet Take 100 mcg by mouth daily. 12/31/16   [provider]  metFORMIN (GLUCOPHAGE) 500 MG tablet Take 500 mg by mouth 2 (two) times daily with a meal.  03/26/14   [provider]  metoprolol (LOPRESSOR) 50 MG tablet Take 50 mg by mouth 2 (two) times daily.      [provider]  nystatin cream (MYCOSTATIN) APPLY TO AFFECTED AREA(S) TWICE DAILY 08/02/17   Lazaro Arms, MD  nystatin-triamcinolone (MYCOLOG II) cream Apply 1 application topically 2 (two) times daily. 05/04/17   Adline Potter, NP  oxybutynin (DITROPAN) 5 MG tablet TAKE TWO TABLETS BY MOUTH EVERY MORNING and TAKE ONE TABLET BY MOUTH AT NOON and TAKE ONE TABLET BY MOUTH EVERY NIGHT AT BEDTIME 05/02/18   Lazaro Arms, MD  triamcinolone cream (KENALOG) 0.1 % APPLY TO AFFECTED AREA(S) TWICE DAILY 08/02/17   Lazaro Arms, MD    Family History Family History  Problem Relation Age of Onset  . Heart attack Mother   . Diabetes Mother   . Hypertension Mother   . Sick sinus syndrome Brother        Pacemaker  . Congenital heart disease Brother   . Stroke Brother   . Coronary artery disease Brother   . Breast cancer Maternal Aunt   . Cancer Cousin        leukemia    Social History Social History   Tobacco Use  . Smoking status: Former Smoker    Packs/day: 1.00    Years: 2.00    Pack years: 2.00    Types: Cigarettes     Last attempt to quit: 12/13/1974    Years since quitting: 43.6  . Smokeless tobacco: Never Used  Substance Use Topics  . Alcohol use: No  . Drug use: No     Allergies   Aspirin; Codeine; and Latex   Review of Systems Review of Systems  Constitutional: Negative  for appetite change.  HENT: Negative for congestion.   Respiratory: Negative for shortness of breath.   Cardiovascular: Negative for chest pain.  Gastrointestinal: Negative for abdominal pain.  Genitourinary: Negative for dysuria.  Musculoskeletal: Negative for back pain.  Neurological: Positive for headaches.  Hematological: Negative for adenopathy.     Physical Exam Updated Vital Signs BP 129/63   Pulse 65   Temp 98.2 F (36.8 C)   Resp (!) 22   Ht 5\' 6"  (1.676 m)   Wt 109.8 kg (242 lb)   SpO2 97%   BMI 39.06 kg/m   Physical Exam  Constitutional: She is oriented to person, place, and time. She appears well-developed.  HENT:  Head: Normocephalic.  Eyes: Pupils are equal, round, and reactive to light. EOM are normal.  Neck: Neck supple.  Cardiovascular: Normal rate, regular rhythm and normal pulses.  Abdominal: Soft.  Musculoskeletal:       Right lower leg: Normal. She exhibits no tenderness.       Left lower leg: Normal. She exhibits no tenderness.  Neurological: She is alert and oriented to person, place, and time. No cranial nerve deficit.  Sensation and strength intact in both upper extremities.  Skin: Skin is warm. Capillary refill takes less than 2 seconds.     ED Treatments / Results  Labs (all labs ordered are listed, but only abnormal results are displayed) Labs Reviewed  BASIC METABOLIC PANEL - Abnormal; Notable for the following components:      Result Value   Glucose, Bld 136 (*)    All other components within normal limits  CBC  TROPONIN I    EKG EKG Interpretation  Date/Time:  Tuesday July 11 2018 20:27:32 EDT Ventricular Rate:  89 PR Interval:  162 QRS Duration: 82 QT  Interval:  354 QTC Calculation: 430 R Axis:   -49 Text Interpretation:  Normal sinus rhythm Low voltage QRS Left anterior fascicular block Abnormal ECG No significant change since last tracing Confirmed by [phone] [id number]) on 07/11/2018 9:56:52 PM   Radiology Dg Chest 2 View  Result Date: 07/11/2018 CLINICAL DATA:  62 y/o F; left-sided chest pain and left arm weakness with tingling beginning today. Dizziness 3 days ago. EXAM: CHEST - 2 VIEW COMPARISON:  09/04/2017 chest radiograph. FINDINGS: Stable heart size and mediastinal contours are within normal limits. Both lungs are clear. The visualized skeletal structures are unremarkable. IMPRESSION: No acute pulmonary process identified. Electronically Signed   By: 09/06/2017 M.D.   On: 07/11/2018 21:22    Procedures Procedures (including critical care time)  Medications Ordered in ED Medications  promethazine (PHENERGAN) injection 12.5 mg (12.5 mg Intravenous Given 07/11/18 2217)  sodium chloride 0.9 % bolus 500 mL (0 mLs Intravenous Stopped 07/12/18 0003)     Initial Impression / Assessment and Plan / ED Course  I have reviewed the triage vital signs and the nursing notes.  Pertinent labs & imaging results that were available during my care of the patient were reviewed by me and considered in my medical decision making (see chart for details).     Patient with headache.  History of same.  Initial hypotension improved.  Reviewed neurology records.  Feels better after treatment.  Complaint of double vision but benign exam.  Has had previous pseudotumor.  Will have follow-up with neurology.  Final Clinical Impressions(s) / ED Diagnoses   Final diagnoses:  Nonspecific chest pain  Nonintractable headache, unspecified chronicity pattern, unspecified headache type    ED Discharge Orders  None       Benjiman Core, MD 07/12/18 [phone]

## 2018-08-24 DIAGNOSIS — R05 Cough: Secondary | ICD-10-CM | POA: Diagnosis not present

## 2018-08-24 DIAGNOSIS — J019 Acute sinusitis, unspecified: Secondary | ICD-10-CM | POA: Diagnosis not present

## 2018-08-24 DIAGNOSIS — R197 Diarrhea, unspecified: Secondary | ICD-10-CM | POA: Diagnosis not present

## 2018-09-12 DIAGNOSIS — L84 Corns and callosities: Secondary | ICD-10-CM | POA: Diagnosis not present

## 2018-09-12 DIAGNOSIS — R252 Cramp and spasm: Secondary | ICD-10-CM | POA: Diagnosis not present

## 2018-09-12 DIAGNOSIS — R05 Cough: Secondary | ICD-10-CM | POA: Diagnosis not present

## 2018-09-15 ENCOUNTER — Emergency Department (HOSPITAL_COMMUNITY): Payer: Medicare Other

## 2018-09-15 ENCOUNTER — Encounter (HOSPITAL_COMMUNITY): Payer: Self-pay | Admitting: Emergency Medicine

## 2018-09-15 ENCOUNTER — Emergency Department (HOSPITAL_COMMUNITY)
Admission: EM | Admit: 2018-09-15 | Discharge: 2018-09-15 | Disposition: A | Discharge status: Home / Self Care | Payer: Medicare Other | Attending: Emergency Medicine | Admitting: Emergency Medicine

## 2018-09-15 ENCOUNTER — Other Ambulatory Visit: Payer: Self-pay

## 2018-09-15 DIAGNOSIS — E119 Type 2 diabetes mellitus without complications: Secondary | ICD-10-CM | POA: Diagnosis not present

## 2018-09-15 DIAGNOSIS — S0990XA Unspecified injury of head, initial encounter: Secondary | ICD-10-CM

## 2018-09-15 DIAGNOSIS — S098XXA Other specified injuries of head, initial encounter: Secondary | ICD-10-CM | POA: Diagnosis not present

## 2018-09-15 DIAGNOSIS — Y929 Unspecified place or not applicable: Secondary | ICD-10-CM | POA: Diagnosis not present

## 2018-09-15 DIAGNOSIS — W228XXA Striking against or struck by other objects, initial encounter: Secondary | ICD-10-CM | POA: Diagnosis not present

## 2018-09-15 DIAGNOSIS — J45909 Unspecified asthma, uncomplicated: Secondary | ICD-10-CM | POA: Diagnosis not present

## 2018-09-15 DIAGNOSIS — I10 Essential (primary) hypertension: Secondary | ICD-10-CM | POA: Insufficient documentation

## 2018-09-15 DIAGNOSIS — E039 Hypothyroidism, unspecified: Secondary | ICD-10-CM | POA: Insufficient documentation

## 2018-09-15 DIAGNOSIS — R51 Headache: Secondary | ICD-10-CM | POA: Diagnosis not present

## 2018-09-15 DIAGNOSIS — S060X0A Concussion without loss of consciousness, initial encounter: Secondary | ICD-10-CM | POA: Diagnosis not present

## 2018-09-15 DIAGNOSIS — Y999 Unspecified external cause status: Secondary | ICD-10-CM | POA: Diagnosis not present

## 2018-09-15 DIAGNOSIS — Z79899 Other long term (current) drug therapy: Secondary | ICD-10-CM | POA: Diagnosis not present

## 2018-09-15 DIAGNOSIS — Z91040 Latex allergy status: Secondary | ICD-10-CM | POA: Diagnosis not present

## 2018-09-15 DIAGNOSIS — Y939 Activity, unspecified: Secondary | ICD-10-CM | POA: Insufficient documentation

## 2018-09-15 DIAGNOSIS — Z87891 Personal history of nicotine dependence: Secondary | ICD-10-CM | POA: Diagnosis not present

## 2018-09-15 NOTE — ED Provider Notes (Signed)
[facility] EMERGENCY DEPARTMENT Provider Note   CSN: [id number] Arrival date & time: 09/15/18  1406     History   Chief Complaint Chief Complaint  Patient presents with  . Head Injury    HPI [name] is a 62 y.o. female.  HPI Patient presents after sustaining an injury to her head. Patient has multiple medical issues, but notes that she was in her usual state of health until just prior to arrival. Patient sustained an injury to her crown, when a heavy shelf fell. No loss of consciousness, no subsequent development of new weakness in her arms, legs nor incontinence. No overt confusion, though she does note that she is sleepy. No medication taken for relief. She is here with her husband who assists with the HPI.  Past Medical History:  Diagnosis Date  . Asthma   . Chronic back pain   . Chronic leg pain    Bilateral  . Chronic pelvic pain in female   . Essential hypertension   . Fibromyalgia   . H/O cardiac catheterization    No significant CAD (only 20% LAD) 03/02/13 with normal LV function.  Marland Kitchen Headache   . High cholesterol   . Hypothyroidism   . Mental disorder   . OAB (overactive bladder) 05/21/2013  . Obstructive sleep apnea    Noncompliant with CPAP due to claustophobia.  . Panic attacks   . Rectocele 05/21/2013  . Seizures (HCC)    1 severe seizure 2015; has had "small ones" since including 9/18  . Type 2 diabetes mellitus [facility])     Patient Active Problem List   Diagnosis Date Noted  . SUI (stress urinary incontinence, female) 05/05/2018  . Screening for colorectal cancer 05/05/2018  . Encounter for well woman exam with routine gynecological exam 05/05/2018  . Breast pain, right 05/05/2018  . Insomnia 09/13/2017  . Restless leg 09/13/2017  . Superficial fungus infection of skin 05/04/2017  . Bruising 10/14/2016  . Chest pain 10/04/2016  . Hypothyroidism 10/04/2016  . Idiopathic intracranial hypertension 11/19/2015  . Papilledema 09/08/2015  .  Left facial numbness 08/22/2015  . Epilepsy, generalized, convulsive (HCC) 06/05/2015  . Obstructive sleep apnea 06/05/2015  . Headache, occipital 06/05/2015  . Neck pain 06/05/2015  . Occipital neuralgia of right side 06/05/2015  . Seizure (HCC) 07/21/2014  . Rectocele 05/21/2013  . OAB (overactive bladder) 05/21/2013  . Difficulty in walking(719.7) 04/11/2013  . Leg weakness, bilateral 04/11/2013  . Diabetes (HCC) 02/28/2013  . Morbid obesity (HCC) 02/28/2013  . Hyperlipidemia 02/28/2013  . CHRONIC OSTEOMYELITIS ANKLE AND FOOT 07/22/2009  . Hypertension 07/22/2009    Past Surgical History:  Procedure Laterality Date  . ABDOMINAL HYSTERECTOMY    . ANKLE SURGERY    . BACK SURGERY    . CARPAL TUNNEL RELEASE    . knee replacements     bilateral  . LEFT HEART CATHETERIZATION WITH CORONARY ANGIOGRAM N/A 03/02/2013   Procedure: LEFT HEART CATHETERIZATION WITH CORONARY ANGIOGRAM;  Surgeon: Peter M Swaziland, MD;  Location: [facility] CATH LAB;  Service: Cardiovascular;  Laterality: N/A;  . TONSILLECTOMY    . TUBAL LIGATION       OB History    Gravida  2   Para  2   Term  2   Preterm      AB      Living  2     SAB      TAB      Ectopic      Multiple  Live Births  2            Home Medications    Prior to Admission medications   Medication Sig Start Date End Date Taking? Authorizing Provider  albuterol (PROVENTIL HFA;VENTOLIN HFA) 108 (90 Base) MCG/ACT inhaler Inhale 1-2 puffs into the lungs every 6 (six) hours as needed for wheezing or shortness of breath.    [provider]  atorvastatin (LIPITOR) 40 MG tablet Take 40 mg by mouth at bedtime.  10/15/14   [provider]  clobetasol (TEMOVATE) 0.05 % external solution apply TO SCALP TWICE DAILY AS NEEDED. USE for 1-2 WEEKS AT A TIME 04/04/18   [provider]  Fluocinolone Acetonide Scalp 0.01 % OIL Apply 1 application topically daily as needed.    [provider]  gabapentin  (NEURONTIN) 600 MG tablet Take 1/2 to 1 pill in the evenoing and 1/2 to 1 pill at night 09/13/17   Sater, Pearletha Furl, MD  ibuprofen (ADVIL,MOTRIN) 200 MG tablet Take 400 mg by mouth as needed.    [provider]  imipramine (TOFRANIL) 50 MG tablet TAKE ONE TABLET BY MOUTH EVERY NIGHT AT BEDTIME 05/02/18   Lazaro Arms, MD  levETIRAcetam (KEPPRA) 500 MG tablet Take 1 tablet (500 mg total) by mouth 2 (two) times daily. 05/24/18   Sater, Pearletha Furl, MD  levothyroxine (SYNTHROID, LEVOTHROID) 100 MCG tablet Take 100 mcg by mouth daily. 12/31/16   [provider]  metFORMIN (GLUCOPHAGE) 500 MG tablet Take 500 mg by mouth 2 (two) times daily with a meal.  03/26/14   [provider]  metoprolol (LOPRESSOR) 50 MG tablet Take 50 mg by mouth 2 (two) times daily.      [provider]  nystatin cream (MYCOSTATIN) APPLY TO AFFECTED AREA(S) TWICE DAILY 08/02/17   Lazaro Arms, MD  nystatin-triamcinolone (MYCOLOG II) cream Apply 1 application topically 2 (two) times daily. 05/04/17   Adline Potter, NP  oxybutynin (DITROPAN) 5 MG tablet TAKE TWO TABLETS BY MOUTH EVERY MORNING and TAKE ONE TABLET BY MOUTH AT NOON and TAKE ONE TABLET BY MOUTH EVERY NIGHT AT BEDTIME 05/02/18   Lazaro Arms, MD  triamcinolone cream (KENALOG) 0.1 % APPLY TO AFFECTED AREA(S) TWICE DAILY 08/02/17   Lazaro Arms, MD    Family History Family History  Problem Relation Age of Onset  . Heart attack Mother   . Diabetes Mother   . Hypertension Mother   . Sick sinus syndrome Brother        Pacemaker  . Congenital heart disease Brother   . Stroke Brother   . Coronary artery disease Brother   . Breast cancer Maternal Aunt   . Cancer Cousin        leukemia    Social History Social History   Tobacco Use  . Smoking status: Former Smoker    Packs/day: 1.00    Years: 2.00    Pack years: 2.00    Types: Cigarettes    Last attempt to quit: 12/13/1974    Years since quitting: 43.7  . Smokeless  tobacco: Never Used  Substance Use Topics  . Alcohol use: No  . Drug use: No     Allergies   Aspirin; Codeine; and Latex   Review of Systems Review of Systems  Constitutional:       Per HPI, otherwise negative  HENT:       Per HPI, otherwise negative  Respiratory:       Per HPI,  otherwise negative  Cardiovascular:       Per HPI, otherwise negative  Gastrointestinal: Negative for vomiting.  Endocrine:       Negative aside from HPI  Genitourinary:       Neg aside from HPI   Musculoskeletal:       Per HPI, otherwise negative  Skin: Negative.   Neurological: Negative for syncope.     Physical Exam Updated Vital Signs BP 137/70 (BP Location: Right Arm)   Pulse 87   Temp 98.3 F (36.8 C) (Oral)   Resp 20   Ht 5\' 7"  (1.702 m)   Wt 108.9 kg   SpO2 96%   BMI 37.59 kg/m   Physical Exam  Constitutional: She is oriented to person, place, and time. She appears well-developed and well-nourished. No distress.  HENT:  Head: Normocephalic and atraumatic.  Eyes: Conjunctivae and EOM are normal.  Neck:  Patient describes chronic neck pain, soreness, without change currently, no deformity, no apparent range of motion limitation  Cardiovascular: Normal rate and regular rhythm.  Pulmonary/Chest: Effort normal and breath sounds normal. No stridor. No respiratory distress.  Abdominal: She exhibits no distension.  Musculoskeletal: She exhibits no edema.  Neurological: She is alert and oriented to person, place, and time. She displays no atrophy and no tremor. No cranial nerve deficit. She exhibits normal muscle tone. She displays no seizure activity. Coordination normal.  Skin: Skin is warm and dry.  Psychiatric: She has a normal mood and affect.  Nursing note and vitals reviewed.    ED Treatments / Results  Labs (all labs ordered are listed, but only abnormal results are displayed) Labs Reviewed - No data to display  EKG None  Radiology Ct Head Wo Contrast  Result  Date: 09/15/2018 CLINICAL DATA:  Head injury.  Headache EXAM: CT HEAD WITHOUT CONTRAST TECHNIQUE: Contiguous axial images were obtained from the base of the skull through the vertex without intravenous contrast. COMPARISON:  CT head 09/04/2017 FINDINGS: Brain: No evidence of acute infarction, hemorrhage, hydrocephalus, extra-axial collection or mass lesion/mass effect. Vascular: Negative for hyperdense vessel Skull: Negative Sinuses/Orbits: Bilateral cataract surgery. Paranasal sinuses clear. Other: None IMPRESSION: Negative CT head Electronically Signed   By: Marlan Palau M.D.   On: 09/15/2018 14:44    Procedures Procedures (including critical care time)  Medications Ordered in ED Medications - No data to display   Initial Impression / Assessment and Plan / ED Course  I have reviewed the triage vital signs and the nursing notes.  Pertinent labs & imaging results that were available during my care of the patient were reviewed by me and considered in my medical decision making (see chart for details).  After the initial after the initial evaluation I reviewed the patient's CT scan, discussed with her and her husband.  Along with no evidence for intracranial pathology, fracture, or suspicion for head injury with likely concussion. Patient is not on blood thinning medication, and after discussion on return precautions, follow-up instructions, and the course of her illness, she was discharged in stable condition.  Final Clinical Impressions(s) / ED Diagnoses   Final diagnoses:  Injury of head, initial encounter  Concussion without loss of consciousness, initial encounter     Gerhard Munch, MD 09/15/18 1510

## 2018-09-15 NOTE — ED Triage Notes (Signed)
Pt states having a shelve of toy cars fell on her head an hour ago.  Pt c/o of dizziness and being tired

## 2018-09-26 DIAGNOSIS — L84 Corns and callosities: Secondary | ICD-10-CM | POA: Diagnosis not present

## 2018-09-26 DIAGNOSIS — R252 Cramp and spasm: Secondary | ICD-10-CM | POA: Diagnosis not present

## 2018-09-26 DIAGNOSIS — E1165 Type 2 diabetes mellitus with hyperglycemia: Secondary | ICD-10-CM | POA: Diagnosis not present

## 2018-09-26 DIAGNOSIS — R05 Cough: Secondary | ICD-10-CM | POA: Diagnosis not present

## 2018-09-27 ENCOUNTER — Other Ambulatory Visit: Payer: Self-pay | Admitting: Obstetrics & Gynecology

## 2018-10-03 DIAGNOSIS — E782 Mixed hyperlipidemia: Secondary | ICD-10-CM | POA: Diagnosis not present

## 2018-10-03 DIAGNOSIS — E039 Hypothyroidism, unspecified: Secondary | ICD-10-CM | POA: Diagnosis not present

## 2018-10-03 DIAGNOSIS — R1010 Upper abdominal pain, unspecified: Secondary | ICD-10-CM | POA: Diagnosis not present

## 2018-10-03 DIAGNOSIS — E1169 Type 2 diabetes mellitus with other specified complication: Secondary | ICD-10-CM | POA: Diagnosis not present

## 2018-10-03 DIAGNOSIS — I10 Essential (primary) hypertension: Secondary | ICD-10-CM | POA: Diagnosis not present

## 2018-10-04 ENCOUNTER — Ambulatory Visit (HOSPITAL_COMMUNITY)
Admission: RE | Admit: 2018-10-04 | Discharge: 2018-10-04 | Disposition: A | Discharge status: Home / Self Care | Payer: Medicare Other | Source: Ambulatory Visit | Attending: Internal Medicine | Admitting: Internal Medicine

## 2018-10-04 ENCOUNTER — Other Ambulatory Visit (HOSPITAL_COMMUNITY): Payer: Self-pay | Admitting: Internal Medicine

## 2018-10-04 DIAGNOSIS — R1011 Right upper quadrant pain: Secondary | ICD-10-CM | POA: Diagnosis not present

## 2018-10-15 ENCOUNTER — Other Ambulatory Visit: Payer: Self-pay

## 2018-10-15 ENCOUNTER — Emergency Department (HOSPITAL_COMMUNITY): Payer: Medicare Other

## 2018-10-15 ENCOUNTER — Encounter (HOSPITAL_COMMUNITY): Payer: Self-pay

## 2018-10-15 ENCOUNTER — Emergency Department (HOSPITAL_COMMUNITY)
Admission: EM | Admit: 2018-10-15 | Discharge: 2018-10-15 | Disposition: A | Discharge status: Home / Self Care | Payer: Medicare Other | Attending: Emergency Medicine | Admitting: Emergency Medicine

## 2018-10-15 DIAGNOSIS — R1013 Epigastric pain: Secondary | ICD-10-CM | POA: Insufficient documentation

## 2018-10-15 DIAGNOSIS — R1010 Upper abdominal pain, unspecified: Secondary | ICD-10-CM | POA: Diagnosis not present

## 2018-10-15 DIAGNOSIS — Z79899 Other long term (current) drug therapy: Secondary | ICD-10-CM | POA: Diagnosis not present

## 2018-10-15 DIAGNOSIS — Z91040 Latex allergy status: Secondary | ICD-10-CM | POA: Diagnosis not present

## 2018-10-15 DIAGNOSIS — J45909 Unspecified asthma, uncomplicated: Secondary | ICD-10-CM | POA: Diagnosis not present

## 2018-10-15 DIAGNOSIS — E119 Type 2 diabetes mellitus without complications: Secondary | ICD-10-CM | POA: Diagnosis not present

## 2018-10-15 DIAGNOSIS — Z7984 Long term (current) use of oral hypoglycemic drugs: Secondary | ICD-10-CM | POA: Diagnosis not present

## 2018-10-15 DIAGNOSIS — Z87891 Personal history of nicotine dependence: Secondary | ICD-10-CM | POA: Insufficient documentation

## 2018-10-15 DIAGNOSIS — I10 Essential (primary) hypertension: Secondary | ICD-10-CM | POA: Insufficient documentation

## 2018-10-15 DIAGNOSIS — Z96653 Presence of artificial knee joint, bilateral: Secondary | ICD-10-CM | POA: Diagnosis not present

## 2018-10-15 DIAGNOSIS — E039 Hypothyroidism, unspecified: Secondary | ICD-10-CM | POA: Diagnosis not present

## 2018-10-15 DIAGNOSIS — K429 Umbilical hernia without obstruction or gangrene: Secondary | ICD-10-CM | POA: Diagnosis not present

## 2018-10-15 DIAGNOSIS — R079 Chest pain, unspecified: Secondary | ICD-10-CM | POA: Diagnosis not present

## 2018-10-15 LAB — CBC WITH DIFFERENTIAL/PLATELET
Abs Immature Granulocytes: 0.02 10*3/uL (ref 0.00–0.07)
BASOS PCT: 1 %
Basophils Absolute: 0.1 10*3/uL (ref 0.0–0.1)
EOS PCT: 3 %
Eosinophils Absolute: 0.2 10*3/uL (ref 0.0–0.5)
HCT: 45.5 % (ref 36.0–46.0)
HEMOGLOBIN: 14.1 g/dL (ref 12.0–15.0)
Immature Granulocytes: 0 %
Lymphocytes Relative: 34 %
Lymphs Abs: 2.3 10*3/uL (ref 0.7–4.0)
MCH: 28.2 pg (ref 26.0–34.0)
MCHC: 31 g/dL (ref 30.0–36.0)
MCV: 91 fL (ref 80.0–100.0)
MONO ABS: 0.7 10*3/uL (ref 0.1–1.0)
Monocytes Relative: 11 %
Neutro Abs: 3.5 10*3/uL (ref 1.7–7.7)
Neutrophils Relative %: 51 %
Platelets: 228 10*3/uL (ref 150–400)
RBC: 5 MIL/uL (ref 3.87–5.11)
RDW: 12.4 % (ref 11.5–15.5)
WBC: 6.7 10*3/uL (ref 4.0–10.5)
nRBC: 0 % (ref 0.0–0.2)

## 2018-10-15 LAB — LIPASE, BLOOD: Lipase: 48 U/L (ref 11–51)

## 2018-10-15 LAB — COMPREHENSIVE METABOLIC PANEL
ALK PHOS: 85 U/L (ref 38–126)
ALT: 22 U/L (ref 0–44)
ANION GAP: 9 (ref 5–15)
AST: 26 U/L (ref 15–41)
Albumin: 4.2 g/dL (ref 3.5–5.0)
BUN: 15 mg/dL (ref 8–23)
CALCIUM: 9.4 mg/dL (ref 8.9–10.3)
CO2: 28 mmol/L (ref 22–32)
Chloride: 104 mmol/L (ref 98–111)
Creatinine, Ser: 0.68 mg/dL (ref 0.44–1.00)
GFR calc non Af Amer: >60 mL/min (ref >60)
Glucose, Bld: 86 mg/dL (ref 70–99)
Potassium: 4.1 mmol/L (ref 3.5–5.1)
SODIUM: 141 mmol/L (ref 135–145)
TOTAL PROTEIN: 7.6 g/dL (ref 6.5–8.1)
Total Bilirubin: 1.1 mg/dL (ref 0.3–1.2)

## 2018-10-15 LAB — TROPONIN I: Troponin I: <0.03 ng/mL (ref <0.03)

## 2018-10-15 MED ORDER — HYDROCODONE-ACETAMINOPHEN 5-325 MG PO TABS: 1.0000 | ORAL_TABLET | Freq: Four times a day (QID) | ORAL | 0 refills | Status: DC | PRN

## 2018-10-15 MED ORDER — IOPAMIDOL (ISOVUE-300) INJECTION 61%
125.0000 mL | Freq: Once | INTRAVENOUS | Status: AC | PRN
  Administered 2018-10-15: 100 mL via INTRAVENOUS

## 2018-10-15 MED ORDER — ONDANSETRON 4 MG PO TBDP: ORAL_TABLET | ORAL | 0 refills | Status: DC

## 2018-10-15 MED ORDER — PANTOPRAZOLE SODIUM 20 MG PO TBEC: 20.0000 mg | DELAYED_RELEASE_TABLET | Freq: Every day | ORAL | 0 refills | Status: DC

## 2018-10-15 MED ORDER — ONDANSETRON HCL 4 MG/2ML IJ SOLN
4.0000 mg | Freq: Once | INTRAMUSCULAR | Status: AC
  Administered 2018-10-15: 4 mg via INTRAVENOUS
  Filled 2018-10-15: qty 2

## 2018-10-15 MED ORDER — HYDROMORPHONE HCL 1 MG/ML IJ SOLN
1.0000 mg | Freq: Once | INTRAMUSCULAR | Status: AC
  Administered 2018-10-15: 1 mg via INTRAVENOUS
  Filled 2018-10-15: qty 1

## 2018-10-15 NOTE — ED Notes (Signed)
Patient transported to CT 

## 2018-10-15 NOTE — ED Provider Notes (Signed)
[facility] EMERGENCY DEPARTMENT Provider Note   CSN: [id number] Arrival date & time: 10/15/18  7654     History   Chief Complaint Chief Complaint  Patient presents with  . Chest Pain    HPI [name] is a 62 y.o. female.  Patient complains of epigastric pain and chest discomfort.  She is been seen by her doctor and got an ultrasound done.  The ultrasound of her gallbladder was unremarkable.  The history is provided by the patient. No language interpreter was used.  Abdominal Pain   This is a new problem. The current episode started 2 days ago. The problem occurs constantly. The problem has not changed since onset.The pain is associated with an unknown factor. The pain is located in the epigastric region. The quality of the pain is aching. The pain is at a severity of 6/10. The pain is moderate. Pertinent negatives include anorexia, diarrhea, frequency, hematuria and headaches. Nothing aggravates the symptoms. Nothing relieves the symptoms. Past workup does not include GI consult. Her past medical history does not include PUD.    Past Medical History:  Diagnosis Date  . Asthma   . Chronic back pain   . Chronic leg pain    Bilateral  . Chronic pelvic pain in female   . Essential hypertension   . Fibromyalgia   . H/O cardiac catheterization    No significant CAD (only 20% LAD) 03/02/13 with normal LV function.  Marland Kitchen Headache   . High cholesterol   . Hypothyroidism   . Mental disorder   . OAB (overactive bladder) 05/21/2013  . Obstructive sleep apnea    Noncompliant with CPAP due to claustophobia.  . Panic attacks   . Rectocele 05/21/2013  . Seizures (HCC)    1 severe seizure 2015; has had "small ones" since including 9/18  . Type 2 diabetes mellitus [facility])     Patient Active Problem List   Diagnosis Date Noted  . SUI (stress urinary incontinence, female) 05/05/2018  . Screening for colorectal cancer 05/05/2018  . Encounter for well woman exam with routine  gynecological exam 05/05/2018  . Breast pain, right 05/05/2018  . Insomnia 09/13/2017  . Restless leg 09/13/2017  . Superficial fungus infection of skin 05/04/2017  . Bruising 10/14/2016  . Chest pain 10/04/2016  . Hypothyroidism 10/04/2016  . Idiopathic intracranial hypertension 11/19/2015  . Papilledema 09/08/2015  . Left facial numbness 08/22/2015  . Epilepsy, generalized, convulsive (HCC) 06/05/2015  . Obstructive sleep apnea 06/05/2015  . Headache, occipital 06/05/2015  . Neck pain 06/05/2015  . Occipital neuralgia of right side 06/05/2015  . Seizure (HCC) 07/21/2014  . Rectocele 05/21/2013  . OAB (overactive bladder) 05/21/2013  . Difficulty in walking(719.7) 04/11/2013  . Leg weakness, bilateral 04/11/2013  . Diabetes (HCC) 02/28/2013  . Morbid obesity (HCC) 02/28/2013  . Hyperlipidemia 02/28/2013  . CHRONIC OSTEOMYELITIS ANKLE AND FOOT 07/22/2009  . Hypertension 07/22/2009    Past Surgical History:  Procedure Laterality Date  . ABDOMINAL HYSTERECTOMY    . ANKLE SURGERY    . BACK SURGERY    . CARPAL TUNNEL RELEASE    . knee replacements     bilateral  . LEFT HEART CATHETERIZATION WITH CORONARY ANGIOGRAM N/A 03/02/2013   Procedure: LEFT HEART CATHETERIZATION WITH CORONARY ANGIOGRAM;  Surgeon: Peter M [place], MD;  Location: [facility] CATH LAB;  Service: Cardiovascular;  Laterality: N/A;  . TONSILLECTOMY    . TUBAL LIGATION       OB History    [place]  2   Para  2   Term  2   Preterm      AB      Living  2     SAB      TAB      Ectopic      Multiple      Live Births  2            Home Medications    Prior to Admission medications   Medication Sig Start Date End Date Taking? Authorizing Provider  albuterol (PROVENTIL HFA;VENTOLIN HFA) 108 (90 Base) MCG/ACT inhaler Inhale 1-2 puffs into the lungs every 6 (six) hours as needed for wheezing or shortness of breath.    [provider]  atorvastatin (LIPITOR) 40 MG tablet Take 40 mg by mouth  at bedtime.  10/15/14   [provider]  clobetasol (TEMOVATE) 0.05 % external solution apply TO SCALP TWICE DAILY AS NEEDED. USE for 1-2 WEEKS AT A TIME 04/04/18   [provider]  Fluocinolone Acetonide Scalp 0.01 % OIL Apply 1 application topically daily as needed.    [provider]  gabapentin (NEURONTIN) 600 MG tablet Take 1/2 to 1 pill in the evenoing and 1/2 to 1 pill at night 09/13/17   Sater, Pearletha Furl, MD  ibuprofen (ADVIL,MOTRIN) 200 MG tablet Take 400 mg by mouth as needed.    [provider]  imipramine (TOFRANIL) 50 MG tablet TAKE ONE TABLET BY MOUTH EVERY NIGHT AT BEDTIME 05/02/18   Lazaro Arms, MD  levETIRAcetam (KEPPRA) 500 MG tablet Take 1 tablet (500 mg total) by mouth 2 (two) times daily. 05/24/18   Sater, Pearletha Furl, MD  levothyroxine (SYNTHROID, LEVOTHROID) 100 MCG tablet Take 100 mcg by mouth daily. 12/31/16   [provider]  metFORMIN (GLUCOPHAGE) 500 MG tablet Take 500 mg by mouth 2 (two) times daily with a meal.  03/26/14   [provider]  metoprolol (LOPRESSOR) 50 MG tablet Take 50 mg by mouth 2 (two) times daily.      [provider]  nystatin cream (MYCOSTATIN) APPLY TO AFFECTED AREA(S) TWICE DAILY 09/27/18   Lazaro Arms, MD  nystatin-triamcinolone (MYCOLOG II) cream Apply 1 application topically 2 (two) times daily. 05/04/17   Adline Potter, NP  oxybutynin (DITROPAN) 5 MG tablet TAKE TWO TABLETS BY MOUTH EVERY MORNING and TAKE ONE TABLET BY MOUTH AT NOON and TAKE ONE TABLET BY MOUTH EVERY NIGHT AT BEDTIME 05/02/18   Lazaro Arms, MD  triamcinolone cream (KENALOG) 0.1 % APPLY TO AFFECTED AREA(S) TWICE DAILY 09/27/18   Lazaro Arms, MD    Family History Family History  Problem Relation Age of Onset  . Heart attack Mother   . Diabetes Mother   . Hypertension Mother   . Sick sinus syndrome Brother        Pacemaker  . Congenital heart disease Brother   . Stroke Brother   . Coronary artery  disease Brother   . Breast cancer Maternal Aunt   . Cancer Cousin        leukemia    Social History Social History   Tobacco Use  . Smoking status: Former Smoker    Packs/day: 1.00    Years: 2.00    Pack years: 2.00    Types: Cigarettes    Last attempt to quit: 12/13/1974    Years since quitting: 43.8  . Smokeless tobacco: Never Used  Substance Use Topics  . Alcohol use: No  .  Drug use: No     Allergies   Aspirin; Codeine; and Latex   Review of Systems Review of Systems  Constitutional: Negative for appetite change and fatigue.  HENT: Negative for congestion, ear discharge and sinus pressure.   Eyes: Negative for discharge.  Respiratory: Negative for cough.   Cardiovascular: Negative for chest pain.  Gastrointestinal: Positive for abdominal pain. Negative for anorexia and diarrhea.  Genitourinary: Negative for frequency and hematuria.  Musculoskeletal: Negative for back pain.  Skin: Negative for rash.  Neurological: Negative for seizures and headaches.  Psychiatric/Behavioral: Negative for hallucinations.     Physical Exam Updated Vital Signs BP 107/72   Pulse 62   Temp (!) 97.5 F (36.4 C) (Oral)   Resp 12   Ht 5\' 7"  (1.702 m)   Wt 113.4 kg   SpO2 90%   BMI 39.16 kg/m   Physical Exam  Constitutional: She is oriented to person, place, and time. She appears well-developed.  HENT:  Head: Normocephalic.  Eyes: Conjunctivae and EOM are normal. No scleral icterus.  Neck: Neck supple. No thyromegaly present.  Cardiovascular: Normal rate and regular rhythm. Exam reveals no gallop and no friction rub.  No murmur heard. Pulmonary/Chest: No stridor. She has no wheezes. She has no rales. She exhibits no tenderness.  Abdominal: She exhibits no distension. There is tenderness. There is no rebound.  Musculoskeletal: Normal range of motion. She exhibits no edema.  Lymphadenopathy:    She has no cervical adenopathy.  Neurological: She is oriented to person, place,  and time. She exhibits normal muscle tone. Coordination normal.  Skin: No rash noted. No erythema.  Psychiatric: She has a normal mood and affect. Her behavior is normal.     ED Treatments / Results  Labs (all labs ordered are listed, but only abnormal results are displayed) Labs Reviewed  CBC WITH DIFFERENTIAL/PLATELET  COMPREHENSIVE METABOLIC PANEL  TROPONIN I  LIPASE, BLOOD    EKG EKG Interpretation  Date/Time:  Sunday October 15 2018 06:51:03 EST Ventricular Rate:  67 PR Interval:    QRS Duration: 89 QT Interval:  428 QTC Calculation: 452 R Axis:   22 Text Interpretation:  Sinus rhythm Low voltage, precordial leads Baseline wander in lead(s) V1 Confirmed by Bethann Berkshire [id number]) on 10/15/2018 6:58:56 AM   Radiology Dg Chest 2 View  Result Date: 10/15/2018 CLINICAL DATA:  Chest pain since last night. EXAM: CHEST - 2 VIEW COMPARISON:  07/11/2018 FINDINGS: Cardiomediastinal silhouette is normal. Mediastinal contours appear intact. There is no evidence of focal airspace consolidation, pleural effusion or pneumothorax. Osseous structures are without acute abnormality. Soft tissues are grossly normal. IMPRESSION: No active cardiopulmonary disease. Electronically Signed   By: Ted Mcalpine M.D.   On: 10/15/2018 07:52   Ct Abdomen Pelvis W Contrast  Result Date: 10/15/2018 CLINICAL DATA:  Chest pain extending into the shoulders. Abdominal distention. Right quadrant pain. EXAM: CT ABDOMEN AND PELVIS WITH CONTRAST TECHNIQUE: Multidetector CT imaging of the abdomen and pelvis was performed using the standard protocol following bolus administration of intravenous contrast. CONTRAST:  ISOVUE-300 IOPAMIDOL (ISOVUE-300) INJECTION 61% COMPARISON:  Right upper quadrant ultrasound/23/19. CT of the abdomen pelvis 08/22/2016 FINDINGS: Lower chest: Mild dependent atelectasis is present. No focal nodule, mass, or airspace disease is present. The heart is upper limits of normal for  size. Coronary artery calcifications are present. No significant pleural or pericardial effusion is present. Hepatobiliary: Punctate calcification is present at the lower pole of the right lobe of the liver. Two other  scattered patch that additional more superior calcification is present. A single calcification is present in left lobe of the liver. No mass lesion is present. There is no intra or extrahepatic biliary dilation. Gallbladder is normal. Pancreas: Unremarkable. No pancreatic ductal dilatation or surrounding inflammatory changes. Spleen: Splenic calcifications are noted. No mass lesion is present. Spleen size is within limits. Adrenals/Urinary Tract: Adrenal glands are normal bilaterally. Contrast is concentrated in the collecting systems. Simple cyst at the lower pole of the right kidney is stable, measuring 9 mm. No other focal renal lesions are present. Ureters are within normal limits. The urinary bladder is unremarkable. Stomach/Bowel: The stomach and duodenum are normal. Small bowel is unremarkable. Terminal ileum is within normal limits. The appendix is not discretely visualized. No focal inflammation is present. The ascending and transverse colon are within normal limits. The descending and sigmoid colon are normal. Vascular/Lymphatic: Atherosclerotic calcifications are present in the aorta without aneurysm. No significant adenopathy is present. Reproductive: Prostate is unremarkable. Other: The local hernia contains fat without bowel. New ventral hernia is present. There is no significant free fluid. Musculoskeletal: Lumbar fusion is again noted at L4-5. Vertebral body heights alignment are maintained. No focal lytic or blastic lesions are present. Bony pelvis is within normal limits. Hips are located and within normal limits bilaterally. IMPRESSION: 1. No acute or focal lesion to explain the patient's right upper quadrant or chest pain. 2. Atherosclerosis. 3. Evidence of prior granulomatous  disease. 4. Coronary artery disease. 5.  Aortic Atherosclerosis (ICD10-I70.0). 6. Paraumbilical hernia contains fat without bowel. 7. Postsurgical and degenerative changes of the lumbar spine as described. Electronically Signed   By: Marin Roberts M.D.   On: 10/15/2018 09:37    Procedures Procedures (including critical care time)  Medications Ordered in ED Medications  HYDROmorphone (DILAUDID) injection 1 mg (1 mg Intravenous Given 10/15/18 0804)  ondansetron (ZOFRAN) injection 4 mg (4 mg Intravenous Given 10/15/18 0805)  iopamidol (ISOVUE-300) 61 % injection 125 mL (100 mLs Intravenous Contrast Given 10/15/18 0817)     Initial Impression / Assessment and Plan / ED Course  I have reviewed the triage vital signs and the nursing notes.  Pertinent labs & imaging results that were available during my care of the patient were reviewed by me and considered in my medical decision making (see chart for details).    Labs CT scan x-ray EKG unremarkable.  Patient improved with pain nausea medicine.  She will be treated for peptic ulcer problems.  And referred back to her family doctor for further evaluation  Final Clinical Impressions(s) / ED Diagnoses   Final diagnoses:  None    ED Discharge Orders    None       Bethann Berkshire, MD 10/15/18 1010

## 2018-10-15 NOTE — ED Triage Notes (Signed)
Pt from home via POV c/o chest pain. Pt states chest pain woke her up. Pain of 10 radiating to shoulders. Pt reports SOB.

## 2018-10-15 NOTE — Discharge Instructions (Signed)
Follow-up with your family doctor this week for recheck 

## 2018-10-15 NOTE — ED Notes (Signed)
Patient transported to X-ray 

## 2018-10-19 ENCOUNTER — Other Ambulatory Visit (HOSPITAL_COMMUNITY): Payer: Self-pay | Admitting: Internal Medicine

## 2018-10-19 DIAGNOSIS — R109 Unspecified abdominal pain: Secondary | ICD-10-CM

## 2018-10-23 ENCOUNTER — Emergency Department (HOSPITAL_COMMUNITY): Payer: Medicare Other

## 2018-10-23 ENCOUNTER — Encounter (HOSPITAL_COMMUNITY): Payer: Self-pay | Admitting: *Deleted

## 2018-10-23 ENCOUNTER — Emergency Department (HOSPITAL_COMMUNITY)
Admission: EM | Admit: 2018-10-23 | Discharge: 2018-10-23 | Disposition: A | Discharge status: Home / Self Care | Payer: Medicare Other | Attending: Emergency Medicine | Admitting: Emergency Medicine

## 2018-10-23 ENCOUNTER — Other Ambulatory Visit: Payer: Self-pay

## 2018-10-23 DIAGNOSIS — E039 Hypothyroidism, unspecified: Secondary | ICD-10-CM | POA: Diagnosis not present

## 2018-10-23 DIAGNOSIS — R51 Headache: Secondary | ICD-10-CM

## 2018-10-23 DIAGNOSIS — E119 Type 2 diabetes mellitus without complications: Secondary | ICD-10-CM | POA: Insufficient documentation

## 2018-10-23 DIAGNOSIS — Z7984 Long term (current) use of oral hypoglycemic drugs: Secondary | ICD-10-CM | POA: Insufficient documentation

## 2018-10-23 DIAGNOSIS — G44209 Tension-type headache, unspecified, not intractable: Secondary | ICD-10-CM | POA: Insufficient documentation

## 2018-10-23 DIAGNOSIS — Z87891 Personal history of nicotine dependence: Secondary | ICD-10-CM | POA: Diagnosis not present

## 2018-10-23 DIAGNOSIS — J45909 Unspecified asthma, uncomplicated: Secondary | ICD-10-CM | POA: Insufficient documentation

## 2018-10-23 DIAGNOSIS — Z79899 Other long term (current) drug therapy: Secondary | ICD-10-CM | POA: Diagnosis not present

## 2018-10-23 DIAGNOSIS — R0902 Hypoxemia: Secondary | ICD-10-CM | POA: Diagnosis not present

## 2018-10-23 DIAGNOSIS — Z743 Need for continuous supervision: Secondary | ICD-10-CM | POA: Diagnosis not present

## 2018-10-23 DIAGNOSIS — R569 Unspecified convulsions: Secondary | ICD-10-CM

## 2018-10-23 DIAGNOSIS — I10 Essential (primary) hypertension: Secondary | ICD-10-CM | POA: Insufficient documentation

## 2018-10-23 DIAGNOSIS — Z91040 Latex allergy status: Secondary | ICD-10-CM | POA: Insufficient documentation

## 2018-10-23 DIAGNOSIS — R519 Headache, unspecified: Secondary | ICD-10-CM

## 2018-10-23 DIAGNOSIS — F410 Panic disorder [episodic paroxysmal anxiety] without agoraphobia: Secondary | ICD-10-CM | POA: Diagnosis present

## 2018-10-23 LAB — I-STAT CHEM 8, ED
BUN: 17 mg/dL (ref 8–23)
CREATININE: 0.9 mg/dL (ref 0.44–1.00)
Calcium, Ion: 1.18 mmol/L (ref 1.15–1.40)
Chloride: 105 mmol/L (ref 98–111)
GLUCOSE: 132 mg/dL — AB (ref 70–99)
HCT: 40 % (ref 36.0–46.0)
Hemoglobin: 13.6 g/dL (ref 12.0–15.0)
Potassium: 3.8 mmol/L (ref 3.5–5.1)
Sodium: 142 mmol/L (ref 135–145)
TCO2: 26 mmol/L (ref 22–32)

## 2018-10-23 MED ORDER — SODIUM CHLORIDE 0.9 % IV BOLUS
1000.0000 mL | Freq: Once | INTRAVENOUS | Status: AC
  Administered 2018-10-23: 1000 mL via INTRAVENOUS

## 2018-10-23 MED ORDER — PROCHLORPERAZINE EDISYLATE 10 MG/2ML IJ SOLN
10.0000 mg | Freq: Once | INTRAMUSCULAR | Status: AC
  Administered 2018-10-23: 10 mg via INTRAVENOUS
  Filled 2018-10-23: qty 2

## 2018-10-23 MED ORDER — DIPHENHYDRAMINE HCL 50 MG/ML IJ SOLN
25.0000 mg | Freq: Once | INTRAMUSCULAR | Status: AC
  Administered 2018-10-23: 25 mg via INTRAVENOUS
  Filled 2018-10-23: qty 1

## 2018-10-23 NOTE — Discharge Instructions (Addendum)
Your CT scan and labs are stable today as discussed.  Go home and sleep as you have received some medicine to help your headache which can make you drowsy. Remember that it is illegal to drive for the next 6 months since you had this suspected seizure tonight.  Make sure you are taking your prescribed medications.

## 2018-10-23 NOTE — ED Provider Notes (Signed)
[facility] EMERGENCY DEPARTMENT Provider Note   CSN: [id number] Arrival date & time: 10/23/18  1956     History   Chief Complaint Chief Complaint  Patient presents with  . Panic Attack    HPI [name] is a 62 y.o. female with a history of HTN, asthma, chronic headaches, epilepsy (last seizure 2015), panic attacks and DM presenting with a suspected seizure tonight.  She was sitting in a chair when she started "zoning out" per husbands description, followed by rigidity in her upper extremities, stating she had symptoms lasting about 25 minutes.  She currently has complaint of a headache c/w her chronic intermittent headaches, denies focal weakness, no n/v, dizziness but does endorse generalized fatigue.  She has missed no doses of her keppra.  The history is provided by the patient.    Past Medical History:  Diagnosis Date  . Asthma   . Chronic back pain   . Chronic leg pain    Bilateral  . Chronic pelvic pain in female   . Essential hypertension   . Fibromyalgia   . H/O cardiac catheterization    No significant CAD (only 20% LAD) 03/02/13 with normal LV function.  Marland Kitchen Headache   . High cholesterol   . Hypothyroidism   . Mental disorder   . OAB (overactive bladder) 05/21/2013  . Obstructive sleep apnea    Noncompliant with CPAP due to claustophobia.  . Panic attacks   . Rectocele 05/21/2013  . Seizures (HCC)    1 severe seizure 2015; has had "small ones" since including 9/18  . Type 2 diabetes mellitus [facility])     Patient Active Problem List   Diagnosis Date Noted  . SUI (stress urinary incontinence, female) 05/05/2018  . Screening for colorectal cancer 05/05/2018  . Encounter for well woman exam with routine gynecological exam 05/05/2018  . Breast pain, right 05/05/2018  . Insomnia 09/13/2017  . Restless leg 09/13/2017  . Superficial fungus infection of skin 05/04/2017  . Bruising 10/14/2016  . Chest pain 10/04/2016  . Hypothyroidism 10/04/2016  . Idiopathic  intracranial hypertension 11/19/2015  . Papilledema 09/08/2015  . Left facial numbness 08/22/2015  . Epilepsy, generalized, convulsive (HCC) 06/05/2015  . Obstructive sleep apnea 06/05/2015  . Headache, occipital 06/05/2015  . Neck pain 06/05/2015  . Occipital neuralgia of right side 06/05/2015  . Seizure (HCC) 07/21/2014  . Rectocele 05/21/2013  . OAB (overactive bladder) 05/21/2013  . Difficulty in walking(719.7) 04/11/2013  . Leg weakness, bilateral 04/11/2013  . Diabetes (HCC) 02/28/2013  . Morbid obesity (HCC) 02/28/2013  . Hyperlipidemia 02/28/2013  . CHRONIC OSTEOMYELITIS ANKLE AND FOOT 07/22/2009  . Hypertension 07/22/2009    Past Surgical History:  Procedure Laterality Date  . ABDOMINAL HYSTERECTOMY    . ANKLE SURGERY    . BACK SURGERY    . CARPAL TUNNEL RELEASE    . knee replacements     bilateral  . LEFT HEART CATHETERIZATION WITH CORONARY ANGIOGRAM N/A 03/02/2013   Procedure: LEFT HEART CATHETERIZATION WITH CORONARY ANGIOGRAM;  Surgeon: Peter M Swaziland, MD;  Location: [facility] CATH LAB;  Service: Cardiovascular;  Laterality: N/A;  . TONSILLECTOMY    . TUBAL LIGATION       OB History    Gravida  2   Para  2   Term  2   Preterm      AB      Living  2     SAB      TAB  Ectopic      Multiple      Live Births  2            Home Medications    Prior to Admission medications   Medication Sig Start Date End Date Taking? Authorizing Provider  albuterol (PROVENTIL HFA;VENTOLIN HFA) 108 (90 Base) MCG/ACT inhaler Inhale 1-2 puffs into the lungs every 6 (six) hours as needed for wheezing or shortness of breath.   Yes [provider]  atorvastatin (LIPITOR) 40 MG tablet Take 40 mg by mouth at bedtime.  10/15/14  Yes [provider]  clobetasol (TEMOVATE) 0.05 % external solution Apply 1 application topically 2 (two) times daily as needed (to scalp for use at 1-2 weeks at one time).  04/04/18  Yes [provider]  Fluocinolone  Acetonide Scalp 0.01 % OIL Apply 1 application topically daily as needed (applied to scalp for psoriasis).    Yes [provider]  ibuprofen (ADVIL,MOTRIN) 200 MG tablet Take 200-400 mg by mouth daily as needed for mild pain or moderate pain.    Yes [provider]  imipramine (TOFRANIL) 50 MG tablet TAKE ONE TABLET BY MOUTH EVERY NIGHT AT BEDTIME Patient taking differently: Take 50 mg by mouth at bedtime.  05/02/18  Yes Lazaro Arms, MD  levETIRAcetam (KEPPRA) 500 MG tablet Take 1 tablet (500 mg total) by mouth 2 (two) times daily. 05/24/18  Yes Sater, Pearletha Furl, MD  levothyroxine (SYNTHROID, LEVOTHROID) 100 MCG tablet Take 100 mcg by mouth every morning.  12/31/16  Yes [provider]  metFORMIN (GLUCOPHAGE) 500 MG tablet Take 500 mg by mouth 2 (two) times daily with a meal.  03/26/14  Yes [provider]  metoprolol (LOPRESSOR) 50 MG tablet Take 50 mg by mouth 2 (two) times daily.     Yes [provider]  nystatin cream (MYCOSTATIN) APPLY TO AFFECTED AREA(S) TWICE DAILY Patient taking differently: Apply 1 application topically 2 (two) times daily as needed for dry skin.  09/27/18  Yes Lazaro Arms, MD  oxybutynin (DITROPAN) 5 MG tablet TAKE TWO TABLETS BY MOUTH EVERY MORNING and TAKE ONE TABLET BY MOUTH AT NOON and TAKE ONE TABLET BY MOUTH EVERY NIGHT AT BEDTIME Patient taking differently: Take 5-10 mg by mouth See admin instructions. TAKE TWO TABLETS BY MOUTH EVERY MORNING and TAKE ONE TABLET BY MOUTH AT NOON and TAKE ONE TABLET BY MOUTH EVERY NIGHT AT BEDTIME 05/02/18  Yes Lazaro Arms, MD  triamcinolone cream (KENALOG) 0.1 % APPLY TO AFFECTED AREA(S) TWICE DAILY Patient taking differently: Apply 1 application topically 2 (two) times daily as needed (for irritation).  09/27/18  Yes Lazaro Arms, MD  HYDROcodone-acetaminophen (NORCO/VICODIN) 5-325 MG tablet Take 1 tablet by mouth every 6 (six) hours as needed for moderate pain. Patient not taking:  Reported on 10/23/2018 10/15/18   Bethann Berkshire, MD  ondansetron (ZOFRAN ODT) 4 MG disintegrating tablet 4mg  ODT q4 hours prn nausea/vomit Patient not taking: Reported on 10/23/2018 10/15/18   13/3/19, MD  pantoprazole (PROTONIX) 20 MG tablet Take 1 tablet (20 mg total) by mouth daily. Patient not taking: Reported on 10/23/2018 10/15/18   13/3/19, MD    Family History Family History  Problem Relation Age of Onset  . Heart attack Mother   . Diabetes Mother   . Hypertension Mother   . Sick sinus syndrome Brother        Pacemaker  . Congenital heart disease Brother   . Stroke Brother   .  Coronary artery disease Brother   . Breast cancer Maternal Aunt   . Cancer Cousin        leukemia    Social History Social History   Tobacco Use  . Smoking status: Former Smoker    Packs/day: 1.00    Years: 2.00    Pack years: 2.00    Types: Cigarettes    Last attempt to quit: 12/13/1974    Years since quitting: 43.8  . Smokeless tobacco: Never Used  Substance Use Topics  . Alcohol use: No  . Drug use: No     Allergies   Aspirin; Codeine; and Latex   Review of Systems Review of Systems  Constitutional: Positive for fatigue. Negative for fever.  HENT: Negative for congestion.   Eyes: Negative.  Negative for photophobia and visual disturbance.  Respiratory: Negative for chest tightness and shortness of breath.   Cardiovascular: Negative for chest pain.  Gastrointestinal: Negative for abdominal pain, nausea and vomiting.  Genitourinary: Negative.   Musculoskeletal: Negative for arthralgias, joint swelling and neck pain.  Skin: Negative.  Negative for rash and wound.  Neurological: Positive for seizures and headaches. Negative for dizziness, tremors, speech difficulty, weakness, light-headedness and numbness.  Psychiatric/Behavioral: Negative.      Physical Exam Updated Vital Signs BP (!) 155/75 (BP Location: Left Arm)   Pulse 79   Temp 98.6 F (37 C) (Oral)    Resp 18   Ht 5\' 7"  (1.702 m)   Wt 108.9 kg   SpO2 97%   BMI 37.59 kg/m   Physical Exam  Constitutional: She is oriented to person, place, and time. She appears well-developed and well-nourished.  Uncomfortable appearing  HENT:  Head: Normocephalic and atraumatic.  Mouth/Throat: Oropharynx is clear and moist.  Eyes: Pupils are equal, round, and reactive to light. EOM are normal.  Neck: Normal range of motion. Neck supple.  Cardiovascular: Normal rate and normal heart sounds.  Pulmonary/Chest: Effort normal.  Abdominal: Soft. There is no tenderness.  Musculoskeletal: Normal range of motion.  Lymphadenopathy:    She has no cervical adenopathy.  Neurological: She is alert and oriented to person, place, and time. She has normal strength. No cranial nerve deficit or sensory deficit. She exhibits normal muscle tone. Gait normal. GCS eye subscore is 4. GCS verbal subscore is 5. GCS motor subscore is 6.  Normal heel-shin, normal rapid alternating movements. Cranial nerves III-XII intact.  No pronator drift.  Skin: Skin is warm and dry. No rash noted.  Psychiatric: She has a normal mood and affect. Her speech is normal and behavior is normal. Thought content normal. Cognition and memory are normal.  Nursing note and vitals reviewed.    ED Treatments / Results  Labs (all labs ordered are listed, but only abnormal results are displayed) Labs Reviewed  I-STAT CHEM 8, ED - Abnormal; Notable for the following components:      Result Value   Glucose, Bld 132 (*)    All other components within normal limits    EKG None  Radiology Ct Head Wo Contrast  Result Date: 10/23/2018 CLINICAL DATA:  Possible seizure. EXAM: CT HEAD WITHOUT CONTRAST TECHNIQUE: Contiguous axial images were obtained from the base of the skull through the vertex without intravenous contrast. COMPARISON:  September 15, 2018 FINDINGS: Brain: Sulci are prominent suggesting volume loss. Ventricles are unremarkable.  Cerebellum, brainstem, and basal cisterns are normal. No acute cortical ischemia or infarct. No mass effect or midline shift. Cerebellum, brainstem, and basal cisterns are normal. No  other acute abnormalities. Vascular: No hyperdense vessel or unexpected calcification. Skull: Normal. Negative for fracture or focal lesion. Sinuses/Orbits: No acute finding. Other: None. IMPRESSION: No acute intracranial abnormalities to explain the patient's seizure. Electronically Signed   By: Gerome Sam III M.D   On: 10/23/2018 22:49    Procedures Procedures (including critical care time)  Medications Ordered in ED Medications  sodium chloride 0.9 % bolus 1,000 mL (1,000 mLs Intravenous New Bag/Given 10/23/18 2215)  prochlorperazine (COMPAZINE) injection 10 mg (10 mg Intravenous Given 10/23/18 2216)  diphenhydrAMINE (BENADRYL) injection 25 mg (25 mg Intravenous Given 10/23/18 2216)     Initial Impression / Assessment and Plan / ED Course  I have reviewed the triage vital signs and the nursing notes.  Pertinent labs & imaging results that were available during my care of the patient were reviewed by me and considered in my medical decision making (see chart for details).     Labs and CT imaging reviewed and discussed with pt.  She was given IV fluids along with benadryl and compazine and reports improved headache (was 10/10, now 5/10). No focal findings on exam.  Offered dose of toradol, pt refused, stating she feels well enough to be able to go home and sleep.  Advised f/u with pcp prn, and to call her neurologist to advise of todays seizure.  She was also reminded that she should not drive for 6 months given this seizure return. Pt agrees and understands.   Final Clinical Impressions(s) / ED Diagnoses   Final diagnoses:  Seizure (HCC)  Acute nonintractable headache, unspecified headache type    ED Discharge Orders    None       Victoriano Lain 10/23/18 2337    Mancel Bale,  MD 10/24/18 2358

## 2018-10-23 NOTE — ED Triage Notes (Signed)
Pt brought in by [facility] for c/o possible seizure; ems reports pt was shaking her arms and when ems touched a arm pt jerked it away; pt states she does not remember what happened; pt was witnessed by family having a seizure; pt is alert and oriented and walked to truck on scene; pt cbg 144

## 2018-10-25 ENCOUNTER — Ambulatory Visit (HOSPITAL_COMMUNITY)
Admission: RE | Admit: 2018-10-25 | Discharge: 2018-10-25 | Disposition: A | Discharge status: Home / Self Care | Payer: Medicare Other | Source: Ambulatory Visit | Attending: Internal Medicine | Admitting: Internal Medicine

## 2018-10-25 ENCOUNTER — Encounter (HOSPITAL_COMMUNITY): Payer: Self-pay

## 2018-11-02 DIAGNOSIS — F5101 Primary insomnia: Secondary | ICD-10-CM | POA: Diagnosis not present

## 2018-11-02 DIAGNOSIS — I10 Essential (primary) hypertension: Secondary | ICD-10-CM | POA: Diagnosis not present

## 2018-11-02 DIAGNOSIS — E1165 Type 2 diabetes mellitus with hyperglycemia: Secondary | ICD-10-CM | POA: Diagnosis not present

## 2018-11-02 DIAGNOSIS — E039 Hypothyroidism, unspecified: Secondary | ICD-10-CM | POA: Diagnosis not present

## 2018-11-16 DIAGNOSIS — I10 Essential (primary) hypertension: Secondary | ICD-10-CM | POA: Diagnosis not present

## 2018-11-23 ENCOUNTER — Ambulatory Visit: Payer: Medicare Other | Admitting: Neurology

## 2018-12-21 ENCOUNTER — Emergency Department (HOSPITAL_COMMUNITY): Payer: Medicare Other

## 2018-12-21 ENCOUNTER — Other Ambulatory Visit: Payer: Self-pay

## 2018-12-21 ENCOUNTER — Encounter (HOSPITAL_COMMUNITY): Payer: Self-pay | Admitting: Emergency Medicine

## 2018-12-21 ENCOUNTER — Emergency Department (HOSPITAL_COMMUNITY)
Admission: EM | Admit: 2018-12-21 | Discharge: 2018-12-21 | Disposition: A | Discharge status: Home / Self Care | Payer: Medicare Other | Attending: Emergency Medicine | Admitting: Emergency Medicine

## 2018-12-21 DIAGNOSIS — J45909 Unspecified asthma, uncomplicated: Secondary | ICD-10-CM | POA: Insufficient documentation

## 2018-12-21 DIAGNOSIS — Z7984 Long term (current) use of oral hypoglycemic drugs: Secondary | ICD-10-CM | POA: Insufficient documentation

## 2018-12-21 DIAGNOSIS — R0789 Other chest pain: Secondary | ICD-10-CM | POA: Diagnosis not present

## 2018-12-21 DIAGNOSIS — E039 Hypothyroidism, unspecified: Secondary | ICD-10-CM | POA: Diagnosis not present

## 2018-12-21 DIAGNOSIS — E119 Type 2 diabetes mellitus without complications: Secondary | ICD-10-CM | POA: Diagnosis not present

## 2018-12-21 DIAGNOSIS — R079 Chest pain, unspecified: Secondary | ICD-10-CM | POA: Diagnosis not present

## 2018-12-21 DIAGNOSIS — Z79899 Other long term (current) drug therapy: Secondary | ICD-10-CM | POA: Insufficient documentation

## 2018-12-21 DIAGNOSIS — I10 Essential (primary) hypertension: Secondary | ICD-10-CM | POA: Diagnosis not present

## 2018-12-21 DIAGNOSIS — F17210 Nicotine dependence, cigarettes, uncomplicated: Secondary | ICD-10-CM | POA: Diagnosis not present

## 2018-12-21 LAB — CBC WITH DIFFERENTIAL/PLATELET
Abs Immature Granulocytes: 0.02 10*3/uL (ref 0.00–0.07)
BASOS ABS: 0.1 10*3/uL (ref 0.0–0.1)
BASOS PCT: 1 %
EOS ABS: 0.2 10*3/uL (ref 0.0–0.5)
EOS PCT: 3 %
HEMATOCRIT: 42.4 % (ref 36.0–46.0)
Hemoglobin: 13.4 g/dL (ref 12.0–15.0)
Immature Granulocytes: 0 %
LYMPHS ABS: 1.6 10*3/uL (ref 0.7–4.0)
Lymphocytes Relative: 33 %
MCH: 28.5 pg (ref 26.0–34.0)
MCHC: 31.6 g/dL (ref 30.0–36.0)
MCV: 90 fL (ref 80.0–100.0)
Monocytes Absolute: 0.5 10*3/uL (ref 0.1–1.0)
Monocytes Relative: 9 %
NEUTROS PCT: 54 %
NRBC: 0 % (ref 0.0–0.2)
Neutro Abs: 2.6 10*3/uL (ref 1.7–7.7)
PLATELETS: 191 10*3/uL (ref 150–400)
RBC: 4.71 MIL/uL (ref 3.87–5.11)
RDW: 12.7 % (ref 11.5–15.5)
WBC: 4.9 10*3/uL (ref 4.0–10.5)

## 2018-12-21 LAB — COMPREHENSIVE METABOLIC PANEL
ALBUMIN: 3.9 g/dL (ref 3.5–5.0)
ALT: 26 U/L (ref 0–44)
AST: 31 U/L (ref 15–41)
Alkaline Phosphatase: 73 U/L (ref 38–126)
Anion gap: 9 (ref 5–15)
BILIRUBIN TOTAL: 0.6 mg/dL (ref 0.3–1.2)
BUN: 15 mg/dL (ref 8–23)
CHLORIDE: 105 mmol/L (ref 98–111)
CO2: 25 mmol/L (ref 22–32)
Calcium: 9.1 mg/dL (ref 8.9–10.3)
Creatinine, Ser: 0.76 mg/dL (ref 0.44–1.00)
GFR calc Af Amer: >60 mL/min (ref >60)
GLUCOSE: 121 mg/dL — AB (ref 70–99)
POTASSIUM: 4 mmol/L (ref 3.5–5.1)
Sodium: 139 mmol/L (ref 135–145)
TOTAL PROTEIN: 6.9 g/dL (ref 6.5–8.1)

## 2018-12-21 LAB — TROPONIN I: Troponin I: <0.03 ng/mL (ref <0.03)

## 2018-12-21 LAB — D-DIMER, QUANTITATIVE: D-Dimer, Quant: 0.37 ug/mL-FEU (ref 0.00–0.50)

## 2018-12-21 MED ORDER — TRAMADOL HCL 50 MG PO TABS: 50.0000 mg | ORAL_TABLET | Freq: Four times a day (QID) | ORAL | 0 refills | Status: DC | PRN

## 2018-12-21 MED ORDER — NAPROXEN 500 MG PO TABS: ORAL_TABLET | ORAL | 0 refills | Status: DC

## 2018-12-21 MED ORDER — MORPHINE SULFATE (PF) 4 MG/ML IV SOLN
4.0000 mg | Freq: Once | INTRAVENOUS | Status: AC
  Administered 2018-12-21: 4 mg via INTRAVENOUS
  Filled 2018-12-21: qty 1

## 2018-12-21 NOTE — ED Provider Notes (Signed)
[facility] EMERGENCY DEPARTMENT Provider Note   CSN: [id number] Arrival date & time: 12/21/18  4098     History   Chief Complaint Chief Complaint  Patient presents with  . Chest Pain    HPI [name] is a 63 y.o. female.  Patient with left-sided chest pain.  No sweating no shortness of breath pain worse with movement  The history is provided by the patient.  Chest Pain  Pain location:  L chest Pain quality: aching   Pain radiates to:  Does not radiate Pain severity:  Moderate Onset quality:  Sudden Duration:  4 hours Timing:  Constant Progression:  Unchanged Chronicity:  New Context: not breathing   Relieved by:  Nothing Exacerbated by: Movement.   Past Medical History:  Diagnosis Date  . Asthma   . Chronic back pain   . Chronic leg pain    Bilateral  . Chronic pelvic pain in female   . Essential hypertension   . Fibromyalgia   . H/O cardiac catheterization    No significant CAD (only 20% LAD) 03/02/13 with normal LV function.  Marland Kitchen Headache   . High cholesterol   . Hypothyroidism   . Mental disorder   . OAB (overactive bladder) 05/21/2013  . Obstructive sleep apnea    Noncompliant with CPAP due to claustophobia.  . Panic attacks   . Rectocele 05/21/2013  . Seizures (HCC)    1 severe seizure 2015; has had "small ones" since including 9/18  . Type 2 diabetes mellitus [facility])     Patient Active Problem List   Diagnosis Date Noted  . SUI (stress urinary incontinence, female) 05/05/2018  . Screening for colorectal cancer 05/05/2018  . Encounter for well woman exam with routine gynecological exam 05/05/2018  . Breast pain, right 05/05/2018  . Insomnia 09/13/2017  . Restless leg 09/13/2017  . Superficial fungus infection of skin 05/04/2017  . Bruising 10/14/2016  . Chest pain 10/04/2016  . Hypothyroidism 10/04/2016  . Idiopathic intracranial hypertension 11/19/2015  . Papilledema 09/08/2015  . Left facial numbness 08/22/2015  . Epilepsy, generalized,  convulsive (HCC) 06/05/2015  . Obstructive sleep apnea 06/05/2015  . Headache, occipital 06/05/2015  . Neck pain 06/05/2015  . Occipital neuralgia of right side 06/05/2015  . Seizure (HCC) 07/21/2014  . Rectocele 05/21/2013  . OAB (overactive bladder) 05/21/2013  . Difficulty in walking(719.7) 04/11/2013  . Leg weakness, bilateral 04/11/2013  . Diabetes (HCC) 02/28/2013  . Morbid obesity (HCC) 02/28/2013  . Hyperlipidemia 02/28/2013  . CHRONIC OSTEOMYELITIS ANKLE AND FOOT 07/22/2009  . Hypertension 07/22/2009    Past Surgical History:  Procedure Laterality Date  . ABDOMINAL HYSTERECTOMY    . ANKLE SURGERY    . BACK SURGERY    . CARPAL TUNNEL RELEASE    . knee replacements     bilateral  . LEFT HEART CATHETERIZATION WITH CORONARY ANGIOGRAM N/A 03/02/2013   Procedure: LEFT HEART CATHETERIZATION WITH CORONARY ANGIOGRAM;  Surgeon: Peter M Swaziland, MD;  Location: [facility] CATH LAB;  Service: Cardiovascular;  Laterality: N/A;  . TONSILLECTOMY    . TUBAL LIGATION       OB History    Gravida  2   Para  2   Term  2   Preterm      AB      Living  2     SAB      TAB      Ectopic      Multiple      Live Births  2            Home Medications    Prior to Admission medications   Medication Sig Start Date End Date Taking? Authorizing Provider  albuterol (PROVENTIL HFA;VENTOLIN HFA) 108 (90 Base) MCG/ACT inhaler Inhale 1-2 puffs into the lungs every 6 (six) hours as needed for wheezing or shortness of breath.    [provider]  atorvastatin (LIPITOR) 40 MG tablet Take 40 mg by mouth at bedtime.  10/15/14   [provider]  clobetasol (TEMOVATE) 0.05 % external solution Apply 1 application topically 2 (two) times daily as needed (to scalp for use at 1-2 weeks at one time).  04/04/18   [provider]  Fluocinolone Acetonide Scalp 0.01 % OIL Apply 1 application topically daily as needed (applied to scalp for psoriasis).     [provider]    HYDROcodone-acetaminophen (NORCO/VICODIN) 5-325 MG tablet Take 1 tablet by mouth every 6 (six) hours as needed for moderate pain. Patient not taking: Reported on 10/23/2018 10/15/18   Bethann BerkshireZammit, Jibreel Fedewa, MD  ibuprofen (ADVIL,MOTRIN) 200 MG tablet Take 200-400 mg by mouth daily as needed for mild pain or moderate pain.     [provider]  imipramine (TOFRANIL) 50 MG tablet TAKE ONE TABLET BY MOUTH EVERY NIGHT AT BEDTIME Patient taking differently: Take 50 mg by mouth at bedtime.  05/02/18   Lazaro ArmsEure, Luther H, MD  levETIRAcetam (KEPPRA) 500 MG tablet Take 1 tablet (500 mg total) by mouth 2 (two) times daily. 05/24/18   Sater, Pearletha Furlichard A, MD  levothyroxine (SYNTHROID, LEVOTHROID) 100 MCG tablet Take 100 mcg by mouth every morning.  12/31/16   [provider]  metFORMIN (GLUCOPHAGE) 500 MG tablet Take 500 mg by mouth 2 (two) times daily with a meal.  03/26/14   [provider]  metoprolol (LOPRESSOR) 50 MG tablet Take 50 mg by mouth 2 (two) times daily.      [provider]  naproxen (NAPROSYN) 500 MG tablet Take one every 12 hours for pain 12/21/18   Bethann BerkshireZammit, Jalia Zuniga, MD  nystatin cream (MYCOSTATIN) APPLY TO AFFECTED AREA(S) TWICE DAILY Patient taking differently: Apply 1 application topically 2 (two) times daily as needed for dry skin.  09/27/18   Lazaro ArmsEure, Luther H, MD  ondansetron (ZOFRAN ODT) 4 MG disintegrating tablet 4mg  ODT q4 hours prn nausea/vomit Patient not taking: Reported on 10/23/2018 10/15/18   Bethann BerkshireZammit, Shanoah Asbill, MD  oxybutynin (DITROPAN) 5 MG tablet TAKE TWO TABLETS BY MOUTH EVERY MORNING and TAKE ONE TABLET BY MOUTH AT NOON and TAKE ONE TABLET BY MOUTH EVERY NIGHT AT BEDTIME Patient taking differently: Take 5-10 mg by mouth See admin instructions. TAKE TWO TABLETS BY MOUTH EVERY MORNING and TAKE ONE TABLET BY MOUTH AT NOON and TAKE ONE TABLET BY MOUTH EVERY NIGHT AT BEDTIME 05/02/18   Lazaro ArmsEure, Luther H, MD  pantoprazole (PROTONIX) 20 MG tablet Take 1 tablet (20 mg total) by  mouth daily. Patient not taking: Reported on 10/23/2018 10/15/18   Bethann BerkshireZammit, Braddock Servellon, MD  triamcinolone cream (KENALOG) 0.1 % APPLY TO AFFECTED AREA(S) TWICE DAILY Patient taking differently: Apply 1 application topically 2 (two) times daily as needed (for irritation).  09/27/18   Lazaro ArmsEure, Luther H, MD    Family History Family History  Problem Relation Age of Onset  . Heart attack Mother   . Diabetes Mother   . Hypertension Mother   . Sick sinus syndrome Brother        Pacemaker  . Congenital heart disease Brother   .  Stroke Brother   . Coronary artery disease Brother   . Breast cancer Maternal Aunt   . Cancer Cousin        leukemia    Social History Social History   Tobacco Use  . Smoking status: Former Smoker    Packs/day: 1.00    Years: 2.00    Pack years: 2.00    Types: Cigarettes    Last attempt to quit: 12/13/1974    Years since quitting: 44.0  . Smokeless tobacco: Never Used  Substance Use Topics  . Alcohol use: No  . Drug use: No     Allergies   Aspirin; Codeine; and Latex   Review of Systems Review of Systems  Cardiovascular: Positive for chest pain.     Physical Exam Updated Vital Signs BP 117/84   Pulse (!) 56   Temp (!) 97.5 F (36.4 C) (Oral)   Resp 14   Ht 5\' 7"  (1.702 m)   Wt 113.4 kg   SpO2 95%   BMI 39.16 kg/m   Physical Exam Vitals signs reviewed.  Constitutional:      Appearance: She is well-developed.  HENT:     Head: Normocephalic.     Nose: Nose normal.  Eyes:     General: No scleral icterus.    Conjunctiva/sclera: Conjunctivae normal.  Neck:     Musculoskeletal: Neck supple.     Thyroid: No thyromegaly.  Cardiovascular:     Rate and Rhythm: Normal rate and regular rhythm.     Heart sounds: No murmur. No friction rub. No gallop.   Pulmonary:     Breath sounds: No stridor. No wheezing or rales.  Chest:     Chest wall: Tenderness present.  Abdominal:     General: There is no distension.     Tenderness: There is no  abdominal tenderness. There is no rebound.  Musculoskeletal: Normal range of motion.  Lymphadenopathy:     Cervical: No cervical adenopathy.  Skin:    Findings: No erythema or rash.  Neurological:     Mental Status: She is alert and oriented to person, place, and time.     Motor: No abnormal muscle tone.     Coordination: Coordination normal.  Psychiatric:        Behavior: Behavior normal.      ED Treatments / Results  Labs (all labs ordered are listed, but only abnormal results are displayed) Labs Reviewed  COMPREHENSIVE METABOLIC PANEL - Abnormal; Notable for the following components:      Result Value   Glucose, Bld 121 (*)    All other components within normal limits  CBC WITH DIFFERENTIAL/PLATELET  TROPONIN I  D-DIMER, QUANTITATIVE (NOT AT [facility])  TROPONIN I    EKG EKG Interpretation  Date/Time:  Thursday December 21 2018 07:31:30 EST Ventricular Rate:  57 PR Interval:    QRS Duration: 88 QT Interval:  376 QTC Calculation: 366 R Axis:   7 Text Interpretation:  Sinus rhythm Low voltage, precordial leads Confirmed by Bethann Berkshire [phone]) on 12/21/2018 9:05:49 AM   Radiology Dg Chest 2 View  Result Date: 12/21/2018 CLINICAL DATA:  Chest pain EXAM: CHEST - 2 VIEW COMPARISON:  10/15/2018 FINDINGS: Heart and mediastinal contours are within normal limits. No focal opacities or effusions. No acute bony abnormality. IMPRESSION: No active cardiopulmonary disease. Electronically Signed   By: Charlett Nose M.D.   On: 12/21/2018 08:33    Procedures Procedures (including critical care time)  Medications Ordered in ED Medications  morphine  4 MG/ML injection 4 mg (4 mg Intravenous Given 12/21/18 0816)     Initial Impression / Assessment and Plan / ED Course  I have reviewed the triage vital signs and the nursing notes.  Pertinent labs & imaging results that were available during my care of the patient were reviewed by me and considered in my medical decision making (see  chart for details).     Labs EKG chest x-ray unremarkable.  Patient diagnosed with chest wall tenderness.  She improved with pain medicine.  She will be sent home with Naprosyn and follow-up with her PCP  Final Clinical Impressions(s) / ED Diagnoses   Final diagnoses:  Atypical chest pain    ED Discharge Orders         Ordered    traMADol (ULTRAM) 50 MG tablet  Every 6 hours PRN,   Status:  Discontinued     12/21/18 1132    naproxen (NAPROSYN) 500 MG tablet     12/21/18 1134           Bethann BerkshireZammit, Cornel Werber, MD 12/21/18 1138

## 2018-12-21 NOTE — ED Triage Notes (Signed)
Pt states that she woke up with chest pains that goes to her left shoulder

## 2018-12-21 NOTE — Discharge Instructions (Addendum)
Follow-up with your family doctor next week for recheck. 

## 2019-01-05 DIAGNOSIS — E039 Hypothyroidism, unspecified: Secondary | ICD-10-CM | POA: Diagnosis not present

## 2019-01-05 DIAGNOSIS — E1165 Type 2 diabetes mellitus with hyperglycemia: Secondary | ICD-10-CM | POA: Diagnosis not present

## 2019-01-05 DIAGNOSIS — E119 Type 2 diabetes mellitus without complications: Secondary | ICD-10-CM | POA: Diagnosis not present

## 2019-01-05 DIAGNOSIS — E1169 Type 2 diabetes mellitus with other specified complication: Secondary | ICD-10-CM | POA: Diagnosis not present

## 2019-01-05 DIAGNOSIS — I10 Essential (primary) hypertension: Secondary | ICD-10-CM | POA: Diagnosis not present

## 2019-01-10 DIAGNOSIS — I10 Essential (primary) hypertension: Secondary | ICD-10-CM | POA: Diagnosis not present

## 2019-01-10 DIAGNOSIS — E782 Mixed hyperlipidemia: Secondary | ICD-10-CM | POA: Diagnosis not present

## 2019-01-10 DIAGNOSIS — K760 Fatty (change of) liver, not elsewhere classified: Secondary | ICD-10-CM | POA: Diagnosis not present

## 2019-01-10 DIAGNOSIS — R945 Abnormal results of liver function studies: Secondary | ICD-10-CM | POA: Diagnosis not present

## 2019-01-10 DIAGNOSIS — E1169 Type 2 diabetes mellitus with other specified complication: Secondary | ICD-10-CM | POA: Diagnosis not present

## 2019-04-17 DIAGNOSIS — Z0000 Encounter for general adult medical examination without abnormal findings: Secondary | ICD-10-CM | POA: Diagnosis not present

## 2019-04-17 DIAGNOSIS — I10 Essential (primary) hypertension: Secondary | ICD-10-CM | POA: Diagnosis not present

## 2019-04-17 DIAGNOSIS — E1121 Type 2 diabetes mellitus with diabetic nephropathy: Secondary | ICD-10-CM | POA: Diagnosis not present

## 2019-04-24 ENCOUNTER — Other Ambulatory Visit: Payer: Self-pay | Admitting: Obstetrics & Gynecology

## 2019-04-24 ENCOUNTER — Other Ambulatory Visit: Payer: Self-pay | Admitting: Neurology

## 2019-04-26 ENCOUNTER — Telehealth: Payer: Self-pay | Admitting: Neurology

## 2019-04-26 DIAGNOSIS — E1121 Type 2 diabetes mellitus with diabetic nephropathy: Secondary | ICD-10-CM | POA: Diagnosis not present

## 2019-04-26 DIAGNOSIS — E039 Hypothyroidism, unspecified: Secondary | ICD-10-CM | POA: Diagnosis not present

## 2019-04-26 DIAGNOSIS — E1165 Type 2 diabetes mellitus with hyperglycemia: Secondary | ICD-10-CM | POA: Diagnosis not present

## 2019-04-26 DIAGNOSIS — I10 Essential (primary) hypertension: Secondary | ICD-10-CM | POA: Diagnosis not present

## 2019-04-26 NOTE — Telephone Encounter (Signed)
Artelia Laroche the pharmacist from Dr Carlena Hurl office is needing to speak to the provider about the pts medications that she is on because they are contradicting with each other. He gave his personal number. Please advise.

## 2019-05-01 NOTE — Telephone Encounter (Signed)
[name] from Dr Loretta Plume office called back. He received Dr. Bonnita Hollow message he left. He asked Dr. Epimenio Foot call him back at  [phone]. He Dr. Epimenio Foot can leave personal number if he LVM he would prefer this. He had to wait awhile to get someone on the phone.  Pt on keppra (Dr. Epimenio Foot prescribed), sertraline (PCP prescribed) and imipramine (Dr. Epimenio Foot prescribed). These interact with each other and he is concerned and wanted to discuss more/makes adjustments. Advised I will send him the message to call.

## 2019-05-08 ENCOUNTER — Other Ambulatory Visit: Payer: Self-pay | Admitting: Obstetrics & Gynecology

## 2019-05-14 DIAGNOSIS — I10 Essential (primary) hypertension: Secondary | ICD-10-CM | POA: Diagnosis not present

## 2019-05-14 DIAGNOSIS — E1121 Type 2 diabetes mellitus with diabetic nephropathy: Secondary | ICD-10-CM | POA: Diagnosis not present

## 2019-05-14 DIAGNOSIS — E039 Hypothyroidism, unspecified: Secondary | ICD-10-CM | POA: Diagnosis not present

## 2019-05-14 DIAGNOSIS — E1165 Type 2 diabetes mellitus with hyperglycemia: Secondary | ICD-10-CM | POA: Diagnosis not present

## 2019-05-15 DIAGNOSIS — E1169 Type 2 diabetes mellitus with other specified complication: Secondary | ICD-10-CM | POA: Diagnosis not present

## 2019-05-15 DIAGNOSIS — I10 Essential (primary) hypertension: Secondary | ICD-10-CM | POA: Diagnosis not present

## 2019-05-15 DIAGNOSIS — E039 Hypothyroidism, unspecified: Secondary | ICD-10-CM | POA: Diagnosis not present

## 2019-05-15 DIAGNOSIS — E1165 Type 2 diabetes mellitus with hyperglycemia: Secondary | ICD-10-CM | POA: Diagnosis not present

## 2019-05-15 DIAGNOSIS — E1121 Type 2 diabetes mellitus with diabetic nephropathy: Secondary | ICD-10-CM | POA: Diagnosis not present

## 2019-05-17 DIAGNOSIS — K760 Fatty (change of) liver, not elsewhere classified: Secondary | ICD-10-CM | POA: Diagnosis not present

## 2019-05-17 DIAGNOSIS — E1140 Type 2 diabetes mellitus with diabetic neuropathy, unspecified: Secondary | ICD-10-CM | POA: Diagnosis not present

## 2019-05-17 DIAGNOSIS — E782 Mixed hyperlipidemia: Secondary | ICD-10-CM | POA: Diagnosis not present

## 2019-05-17 DIAGNOSIS — R945 Abnormal results of liver function studies: Secondary | ICD-10-CM | POA: Diagnosis not present

## 2019-05-17 DIAGNOSIS — I10 Essential (primary) hypertension: Secondary | ICD-10-CM | POA: Diagnosis not present

## 2019-05-18 ENCOUNTER — Ambulatory Visit (HOSPITAL_COMMUNITY)
Admission: RE | Admit: 2019-05-18 | Discharge: 2019-05-18 | Disposition: A | Discharge status: Home / Self Care | Payer: Medicare Other | Source: Ambulatory Visit | Attending: Internal Medicine | Admitting: Internal Medicine

## 2019-05-18 ENCOUNTER — Other Ambulatory Visit (HOSPITAL_COMMUNITY): Payer: Self-pay | Admitting: Internal Medicine

## 2019-05-18 ENCOUNTER — Other Ambulatory Visit: Payer: Self-pay

## 2019-05-18 DIAGNOSIS — M79671 Pain in right foot: Secondary | ICD-10-CM | POA: Diagnosis not present

## 2019-05-18 DIAGNOSIS — W19XXXA Unspecified fall, initial encounter: Secondary | ICD-10-CM | POA: Insufficient documentation

## 2019-05-21 NOTE — Telephone Encounter (Signed)
Discussed with Ovid Curd.   There is a mild drug interaction but risk is low as her doses are low.

## 2019-05-21 NOTE — Telephone Encounter (Signed)
Dr. Felecia Shelling- please call

## 2019-05-21 NOTE — Telephone Encounter (Signed)
[name] from Dr. Juel Burrow office stating that Ovid Curd never received a call back. He was wondering if someone could give him a call. His phone number is [phone].

## 2019-05-24 ENCOUNTER — Other Ambulatory Visit: Payer: Self-pay | Admitting: Neurology

## 2019-06-07 ENCOUNTER — Encounter (HOSPITAL_COMMUNITY): Payer: Self-pay | Admitting: Emergency Medicine

## 2019-06-07 ENCOUNTER — Emergency Department (HOSPITAL_COMMUNITY): Payer: Medicare Other

## 2019-06-07 ENCOUNTER — Emergency Department (HOSPITAL_COMMUNITY)
Admission: EM | Admit: 2019-06-07 | Discharge: 2019-06-07 | Disposition: A | Discharge status: Home / Self Care | Payer: Medicare Other | Attending: Emergency Medicine | Admitting: Emergency Medicine

## 2019-06-07 ENCOUNTER — Other Ambulatory Visit: Payer: Self-pay

## 2019-06-07 DIAGNOSIS — Z79899 Other long term (current) drug therapy: Secondary | ICD-10-CM | POA: Diagnosis not present

## 2019-06-07 DIAGNOSIS — R569 Unspecified convulsions: Secondary | ICD-10-CM | POA: Diagnosis not present

## 2019-06-07 DIAGNOSIS — M79672 Pain in left foot: Secondary | ICD-10-CM | POA: Diagnosis not present

## 2019-06-07 DIAGNOSIS — M7989 Other specified soft tissue disorders: Secondary | ICD-10-CM | POA: Diagnosis not present

## 2019-06-07 DIAGNOSIS — E119 Type 2 diabetes mellitus without complications: Secondary | ICD-10-CM | POA: Diagnosis not present

## 2019-06-07 DIAGNOSIS — J45909 Unspecified asthma, uncomplicated: Secondary | ICD-10-CM | POA: Insufficient documentation

## 2019-06-07 DIAGNOSIS — Z87891 Personal history of nicotine dependence: Secondary | ICD-10-CM | POA: Insufficient documentation

## 2019-06-07 DIAGNOSIS — R51 Headache: Secondary | ICD-10-CM | POA: Diagnosis not present

## 2019-06-07 DIAGNOSIS — E039 Hypothyroidism, unspecified: Secondary | ICD-10-CM | POA: Diagnosis not present

## 2019-06-07 DIAGNOSIS — I10 Essential (primary) hypertension: Secondary | ICD-10-CM | POA: Insufficient documentation

## 2019-06-07 LAB — URINALYSIS, ROUTINE W REFLEX MICROSCOPIC
Bilirubin Urine: NEGATIVE
Glucose, UA: NEGATIVE mg/dL
Hgb urine dipstick: NEGATIVE
Ketones, ur: NEGATIVE mg/dL
Nitrite: POSITIVE — AB
Protein, ur: NEGATIVE mg/dL
Specific Gravity, Urine: 1.01 (ref 1.005–1.030)
pH: 7 (ref 5.0–8.0)

## 2019-06-07 LAB — CBC WITH DIFFERENTIAL/PLATELET
Abs Immature Granulocytes: 0.01 10*3/uL (ref 0.00–0.07)
Basophils Absolute: 0.1 10*3/uL (ref 0.0–0.1)
Basophils Relative: 1 %
Eosinophils Absolute: 0.1 10*3/uL (ref 0.0–0.5)
Eosinophils Relative: 3 %
HCT: 42.7 % (ref 36.0–46.0)
Hemoglobin: 13.3 g/dL (ref 12.0–15.0)
Immature Granulocytes: 0 %
Lymphocytes Relative: 32 %
Lymphs Abs: 1.5 10*3/uL (ref 0.7–4.0)
MCH: 28.5 pg (ref 26.0–34.0)
MCHC: 31.1 g/dL (ref 30.0–36.0)
MCV: 91.6 fL (ref 80.0–100.0)
Monocytes Absolute: 0.5 10*3/uL (ref 0.1–1.0)
Monocytes Relative: 11 %
Neutro Abs: 2.6 10*3/uL (ref 1.7–7.7)
Neutrophils Relative %: 53 %
Platelets: 173 10*3/uL (ref 150–400)
RBC: 4.66 MIL/uL (ref 3.87–5.11)
RDW: 12.4 % (ref 11.5–15.5)
WBC: 4.8 10*3/uL (ref 4.0–10.5)
nRBC: 0 % (ref 0.0–0.2)

## 2019-06-07 LAB — COMPREHENSIVE METABOLIC PANEL
ALT: 20 U/L (ref 0–44)
AST: 23 U/L (ref 15–41)
Albumin: 3.9 g/dL (ref 3.5–5.0)
Alkaline Phosphatase: 74 U/L (ref 38–126)
Anion gap: 9 (ref 5–15)
BUN: 13 mg/dL (ref 8–23)
CO2: 27 mmol/L (ref 22–32)
Calcium: 9 mg/dL (ref 8.9–10.3)
Chloride: 103 mmol/L (ref 98–111)
Creatinine, Ser: 0.78 mg/dL (ref 0.44–1.00)
GFR calc Af Amer: >60 mL/min (ref >60)
GFR calc non Af Amer: >60 mL/min (ref >60)
Glucose, Bld: 93 mg/dL (ref 70–99)
Potassium: 4.8 mmol/L (ref 3.5–5.1)
Sodium: 139 mmol/L (ref 135–145)
Total Bilirubin: 0.9 mg/dL (ref 0.3–1.2)
Total Protein: 6.8 g/dL (ref 6.5–8.1)

## 2019-06-07 LAB — RAPID URINE DRUG SCREEN, HOSP PERFORMED
Amphetamines: NOT DETECTED
Barbiturates: NOT DETECTED
Benzodiazepines: NOT DETECTED
Cocaine: NOT DETECTED
Opiates: NOT DETECTED
Tetrahydrocannabinol: NOT DETECTED

## 2019-06-07 LAB — AMMONIA: Ammonia: 9 umol/L (ref 9–35)

## 2019-06-07 MED ORDER — ACETAMINOPHEN 325 MG PO TABS
650.0000 mg | ORAL_TABLET | Freq: Once | ORAL | Status: AC
  Administered 2019-06-07: 650 mg via ORAL
  Filled 2019-06-07: qty 2

## 2019-06-07 MED ORDER — LEVETIRACETAM 500 MG PO TABS
500.0000 mg | ORAL_TABLET | Freq: Once | ORAL | Status: AC
  Administered 2019-06-07: 500 mg via ORAL
  Filled 2019-06-07: qty 1

## 2019-06-07 MED ORDER — LEVETIRACETAM IN NACL 500 MG/100ML IV SOLN: 500.0000 mg | Freq: Once | INTRAVENOUS | Status: DC

## 2019-06-07 NOTE — ED Provider Notes (Signed)
[facility] EMERGENCY DEPARTMENT Provider Note   CSN: [id number] Arrival date & time: 06/07/19  1256   History   Chief Complaint Chief Complaint  Patient presents with   Seizures    HPI CHIANNA Greene is a 63 y.o. female w/ PMH significant for asthma, HLD, rectocele, seizure d/o, pseudotumor cerebri, hypothyroidism, HTN, T2DM, OSA, panic attacks here for episode of staring into space and decreased consciousness this morning. Husband states she has not acted like herself for the past couple of days and has had a persistent headache. This is similar to her prior headaches, starts at the base of her neck and radiates up to the L side of her head. She has a h/o pseudotumor cerebri for which she has had to have CSF drained in the past. Husband states her seizures are typically tonic-clonic but she did not have any jerking or shaking movement today. He is unsure if she lost bowel or bladder control. He states she slept all day yesterday. He states she tripped and fell 2 days ago but denies hitting her head. He thinks she may have had a medication adjustment by her PCP one month ago but is unsure which medication it was. Patient denies any recent medication adjustments. She has been compliant with her home Keppra except she missed her morning dose this morning. She denies any vision changes, N/V, chest pain, SOB, cough, congestion, sick contacts. Denies smoking, alcohol, or illicit drug use.  Patient does endorse L foot pain present for the past few days. Denies any inciting trauma or injury. Husband states her foot swells occasionally and will have some pain but is unaware of any injury. Patient states it has been painful to walk on it the past few days.      Past Medical History:  Diagnosis Date   Asthma    Chronic back pain    Chronic leg pain    Bilateral   Chronic pelvic pain in female    Essential hypertension    Fibromyalgia    H/O cardiac catheterization    No significant CAD  (only 20% LAD) 03/02/13 with normal LV function.   Headache    High cholesterol    Hypothyroidism    Mental disorder    OAB (overactive bladder) 05/21/2013   Obstructive sleep apnea    Noncompliant with CPAP due to claustophobia.   Panic attacks    Rectocele 05/21/2013   Seizures (HCC)    1 severe seizure 2015; has had "small ones" since including 9/18   Type 2 diabetes mellitus [facility])     Patient Active Problem List   Diagnosis Date Noted   SUI (stress urinary incontinence, female) 05/05/2018   Screening for colorectal cancer 05/05/2018   Encounter for well woman exam with routine gynecological exam 05/05/2018   Breast pain, right 05/05/2018   Insomnia 09/13/2017   Restless leg 09/13/2017   Superficial fungus infection of skin 05/04/2017   Bruising 10/14/2016   Chest pain 10/04/2016   Hypothyroidism 10/04/2016   Idiopathic intracranial hypertension 11/19/2015   Papilledema 09/08/2015   Left facial numbness 08/22/2015   Epilepsy, generalized, convulsive (HCC) 06/05/2015   Obstructive sleep apnea 06/05/2015   Headache, occipital 06/05/2015   Neck pain 06/05/2015   Occipital neuralgia of right side 06/05/2015   Seizure (HCC) 07/21/2014   Rectocele 05/21/2013   OAB (overactive bladder) 05/21/2013   Difficulty in walking(719.7) 04/11/2013   Leg weakness, bilateral 04/11/2013   Diabetes (HCC) 02/28/2013   Morbid obesity (HCC) 02/28/2013  Hyperlipidemia 02/28/2013   CHRONIC OSTEOMYELITIS ANKLE AND FOOT 07/22/2009   Hypertension 07/22/2009    Past Surgical History:  Procedure Laterality Date   ABDOMINAL HYSTERECTOMY     ANKLE SURGERY     BACK SURGERY     CARPAL TUNNEL RELEASE     knee replacements     bilateral   LEFT HEART CATHETERIZATION WITH CORONARY ANGIOGRAM N/A 03/02/2013   Procedure: LEFT HEART CATHETERIZATION WITH CORONARY ANGIOGRAM;  Surgeon: Peter M SwazilandJordan, MD;  [facility];  Service: Cardiovascular;   Laterality: N/A;   TONSILLECTOMY     TUBAL LIGATION       OB History    Gravida  2   Para  2   Term  2   Preterm      AB      Living  2     SAB      TAB      Ectopic      Multiple      Live Births  2           Home Medications    Prior to Admission medications   Medication Sig Start Date End Date Taking? Authorizing Provider  albuterol (PROVENTIL HFA;VENTOLIN HFA) 108 (90 Base) MCG/ACT inhaler Inhale 1-2 puffs into the lungs every 6 (six) hours as needed for wheezing or shortness of breath.    [provider]  atorvastatin (LIPITOR) 40 MG tablet Take 40 mg by mouth at bedtime.  10/15/14   [provider]  clobetasol (TEMOVATE) 0.05 % external solution Apply 1 application topically 2 (two) times daily as needed (to scalp for use at 1-2 weeks at one time).  04/04/18   [provider]  Fluocinolone Acetonide Scalp 0.01 % OIL Apply 1 application topically daily as needed (applied to scalp for psoriasis).     [provider]  HYDROcodone-acetaminophen (NORCO/VICODIN) 5-325 MG tablet Take 1 tablet by mouth every 6 (six) hours as needed for moderate pain. Patient not taking: Reported on 10/23/2018 10/15/18   Bethann BerkshireZammit, Joseph, MD  ibuprofen (ADVIL,MOTRIN) 200 MG tablet Take 200-400 mg by mouth daily as needed for mild pain or moderate pain.     [provider]  imipramine (TOFRANIL) 50 MG tablet TAKE ONE TABLET BY MOUTH EVERY NIGHT AT BEDTIME 05/23/19   Lazaro ArmsEure, Luther H, MD  levETIRAcetam (KEPPRA) 500 MG tablet TAKE ONE TABLET BY MOUTH TWICE DAILY 05/24/19   Sater, Pearletha Furlichard A, MD  levothyroxine (SYNTHROID, LEVOTHROID) 100 MCG tablet Take 100 mcg by mouth every morning.  12/31/16   [provider]  metFORMIN (GLUCOPHAGE) 500 MG tablet Take 500 mg by mouth 2 (two) times daily with a meal.  03/26/14   [provider]  metoprolol (LOPRESSOR) 50 MG tablet Take 50 mg by mouth 2 (two) times daily.      [provider]   naproxen (NAPROSYN) 500 MG tablet Take one every 12 hours for pain 12/21/18   Bethann BerkshireZammit, Joseph, MD  nystatin cream (MYCOSTATIN) APPLY TO AFFECTED AREA(S) TWICE DAILY Patient taking differently: Apply 1 application topically 2 (two) times daily as needed for dry skin.  09/27/18   Lazaro ArmsEure, Luther H, MD  ondansetron (ZOFRAN ODT) 4 MG disintegrating tablet 4mg  ODT q4 hours prn nausea/vomit Patient not taking: Reported on 10/23/2018 10/15/18   Bethann BerkshireZammit, Joseph, MD  oxybutynin (DITROPAN) 5 MG tablet TAKE TWO TABLETS BY MOUTH EVERY MORNING and TAKE ONE TABLET BY MOUTH every day at noon and TAKE ONE TABLET BY MOUTH  EVERY NIGHT AT BEDTIME 05/23/19   Lazaro Arms, MD  pantoprazole (PROTONIX) 20 MG tablet Take 1 tablet (20 mg total) by mouth daily. Patient not taking: Reported on 10/23/2018 10/15/18   Bethann Berkshire, MD  triamcinolone cream (KENALOG) 0.1 % APPLY TO AFFECTED AREA(S) TWICE DAILY Patient taking differently: Apply 1 application topically 2 (two) times daily as needed (for irritation).  09/27/18   Lazaro Arms, MD    Family History Family History  Problem Relation Age of Onset   Heart attack Mother    Diabetes Mother    Hypertension Mother    Sick sinus syndrome Brother        Pacemaker   Congenital heart disease Brother    Stroke Brother    Coronary artery disease Brother    Breast cancer Maternal Aunt    Cancer Cousin        leukemia    Social History Social History   Tobacco Use   Smoking status: Former Smoker    Packs/day: 1.00    Years: 2.00    Pack years: 2.00    Types: Cigarettes    Quit date: 12/13/1974    Years since quitting: 44.5   Smokeless tobacco: Never Used  Substance Use Topics   Alcohol use: No   Drug use: No    Allergies   Aspirin, Codeine, and Latex  Review of Systems Review of Systems  Constitutional: Positive for activity change. Negative for fever.  HENT: Negative for congestion and sore throat.   Eyes: Negative for photophobia and  visual disturbance.  Respiratory: Negative for shortness of breath.   Cardiovascular: Negative for chest pain.  Gastrointestinal: Negative for abdominal pain, constipation, diarrhea, nausea and vomiting.  Genitourinary: Negative for difficulty urinating.  Neurological: Positive for headaches.  Psychiatric/Behavioral: Positive for confusion.   Physical Exam Updated Vital Signs BP 116/76 (BP Location: Left Wrist)    Pulse (!) 56    Temp (!) 97.5 F (36.4 C) (Oral)    Resp 18    Ht 5\' 7"  (1.702 m)    Wt 113.4 kg    SpO2 97%    BMI 39.16 kg/m   Physical Exam Constitutional:      General: She is not in acute distress.    Appearance: She is obese. She is ill-appearing. She is not toxic-appearing.  HENT:     Head: Normocephalic and atraumatic.     Nose: Nose normal.     Mouth/Throat:     Mouth: Mucous membranes are moist.     Pharynx: Oropharynx is clear. No oropharyngeal exudate or posterior oropharyngeal erythema.  Eyes:     General: No visual field deficit or scleral icterus.    Extraocular Movements: Extraocular movements intact.     Pupils: Pupils are equal, round, and reactive to light.  Neck:     Musculoskeletal: Normal range of motion. No neck rigidity.  Cardiovascular:     Rate and Rhythm: Normal rate and regular rhythm.     Heart sounds: Normal heart sounds. No murmur.  Pulmonary:     Effort: Pulmonary effort is normal. No respiratory distress.     Breath sounds: Normal breath sounds. No wheezing or rales.  Chest:     Chest wall: No tenderness.  Abdominal:     General: Bowel sounds are normal.     Palpations: Abdomen is soft.     Tenderness: There is no abdominal tenderness. There is no guarding or rebound.  Musculoskeletal:     Right lower  leg: No edema.     Left lower leg: No edema.     Comments: Tender to palpation to distal L foot with mild swelling. Full ROM intact without joint laxity.  Lymphadenopathy:     Cervical: No cervical adenopathy.  Skin:    General:  Skin is warm and dry.  Neurological:     Mental Status: She is oriented to person, place, and time.     Cranial Nerves: No dysarthria or facial asymmetry.     Sensory: Sensation is intact.     Motor: Motor function is intact. No weakness or pronator drift.     Coordination: Coordination is intact. Romberg sign negative. Finger-Nose-Finger Test normal.    ED Treatments / Results  Labs (all labs ordered are listed, but only abnormal results are displayed) Labs Reviewed  CBC WITH DIFFERENTIAL/PLATELET  COMPREHENSIVE METABOLIC PANEL  URINALYSIS, ROUTINE W REFLEX MICROSCOPIC  RAPID URINE DRUG SCREEN, HOSP PERFORMED  AMMONIA    EKG EKG Interpretation  Date/Time:  Thursday June 07 2019 13:05:32 EDT Ventricular Rate:  57 PR Interval:    QRS Duration: 113 QT Interval:  447 QTC Calculation: 436 R Axis:   12 Text Interpretation:  Sinus rhythm Borderline intraventricular conduction delay Low voltage, precordial leads Confirmed by Elnora Morrison [phone]) on 06/07/2019 1:44:50 PM  Radiology Dg Foot 2 Views Left  Result Date: 06/07/2019 CLINICAL DATA:  Pain and swelling across the top of the of foot at the base of the toes. EXAM: LEFT FOOT - 2 VIEW COMPARISON:  Left foot film August 01, 2016 FINDINGS: There is question subtle cortical discontinuity at the base of the fifth proximal phalanx. If the patient has focal pain here, fracture is not excluded. There is no dislocation. There is no evidence of arthropathy or other focal bone abnormality. Soft tissues are unremarkable. IMPRESSION: There is question subtle cortical discontinuity at the base of the fifth proximal phalanx. If the patient has focal pain here, fracture is not excluded. Electronically Signed   By: Abelardo Diesel M.D.   On: 06/07/2019 14:26    Procedures Procedures (including critical care time)  Medications Ordered in ED Medications  acetaminophen (TYLENOL) tablet 650 mg (has no administration in time range)  levETIRAcetam  (KEPPRA) tablet 500 mg (500 mg Oral Given 06/07/19 1359)    Initial Impression / Assessment and Plan / ED Course  I have reviewed the triage vital signs and the nursing notes.  Pertinent labs & imaging results that were available during my care of the patient were reviewed by me and considered in my medical decision making (see chart for details).   63yo F w/ h/o epilepsy here with few day h/o confusion and staring off into space, not consistent with usual seizure activity per husband. No clear inciting event though may have had a medication adjustment in the last month. Alert and oriented x3 on exam but without recollection of events this morning. Neuro exam without focal abnormalities, slowed mentation. EKG without ST changes. L foot XR with slight cortical changes to proximal 5th toe possibly suggesting fracture. Labs and CT Head pending at transfer of care.     Final Clinical Impressions(s) / ED Diagnoses   Final diagnoses:  None    ED Discharge Orders    None       Rory Percy, DO 06/07/19 1509    Elnora Morrison, MD 06/09/19 [phone]

## 2019-06-07 NOTE — Discharge Instructions (Addendum)
Tests showed no life-threatening condition.  Rest.  Call your neurologist tomorrow for follow-up.

## 2019-06-07 NOTE — ED Triage Notes (Signed)
Pt from home. Family states pt had an unwitnessed seizure. HX of seizure. A/o in room.

## 2019-06-14 DIAGNOSIS — I10 Essential (primary) hypertension: Secondary | ICD-10-CM | POA: Diagnosis not present

## 2019-06-14 DIAGNOSIS — E1121 Type 2 diabetes mellitus with diabetic nephropathy: Secondary | ICD-10-CM | POA: Diagnosis not present

## 2019-06-14 DIAGNOSIS — E039 Hypothyroidism, unspecified: Secondary | ICD-10-CM | POA: Diagnosis not present

## 2019-06-14 DIAGNOSIS — E1165 Type 2 diabetes mellitus with hyperglycemia: Secondary | ICD-10-CM | POA: Diagnosis not present

## 2019-06-14 NOTE — Addendum Note (Signed)
Addended by: Hope Pigeon on: 06/14/2019 12:18 PM   Modules accepted: Orders

## 2019-06-14 NOTE — Telephone Encounter (Signed)
I called pt. Scheduled f/u visit 06/18/19 at 1:00pm. Advised her to check in at 1230pm. Her husband will drive her to appt. She will wear mask and aware her temp will be checked prior to coming in for appt. Updated med list, pharmacy, allergies.   I called [place] Constable back and informed him of scheduled appt. He would like notes forwarded to him after she sees Dr. Felecia Shelling.

## 2019-06-14 NOTE — Telephone Encounter (Signed)
Took call from phone staff. Spoke with Pecola Lawless is a provider for Dr Nevada Crane). He states pt had a seizure about 2 weeks ago and pt was trying to reach office but unable to reach anyone. That is why he was calling our office. He is concerned about medications she is on and wants her to f/u with Dr. Felecia Shelling. Advised I will call pt back. She has not been seen since 05/2018. Appears she cx f/u 11/2018 d/t having flu and was supposed to call back and get that r/s. No pending f/u scheduled   He would like a call back with update after I speak with Dr. Felecia Shelling. His phone: [phone]

## 2019-06-18 ENCOUNTER — Other Ambulatory Visit: Payer: Self-pay

## 2019-06-18 ENCOUNTER — Encounter: Payer: Self-pay | Admitting: Neurology

## 2019-06-18 ENCOUNTER — Ambulatory Visit (INDEPENDENT_AMBULATORY_CARE_PROVIDER_SITE_OTHER): Payer: Medicare Other | Admitting: Neurology

## 2019-06-18 VITALS — BP 139/84 | HR 52 | Temp 98.0°F | Ht 67.0 in | Wt 261.5 lb

## 2019-06-18 DIAGNOSIS — G932 Benign intracranial hypertension: Secondary | ICD-10-CM | POA: Diagnosis not present

## 2019-06-18 DIAGNOSIS — G4733 Obstructive sleep apnea (adult) (pediatric): Secondary | ICD-10-CM | POA: Diagnosis not present

## 2019-06-18 DIAGNOSIS — M542 Cervicalgia: Secondary | ICD-10-CM

## 2019-06-18 DIAGNOSIS — R404 Transient alteration of awareness: Secondary | ICD-10-CM

## 2019-06-18 DIAGNOSIS — R51 Headache: Secondary | ICD-10-CM | POA: Diagnosis not present

## 2019-06-18 DIAGNOSIS — G4700 Insomnia, unspecified: Secondary | ICD-10-CM

## 2019-06-18 DIAGNOSIS — G40309 Generalized idiopathic epilepsy and epileptic syndromes, not intractable, without status epilepticus: Secondary | ICD-10-CM | POA: Diagnosis not present

## 2019-06-18 DIAGNOSIS — R519 Headache, unspecified: Secondary | ICD-10-CM

## 2019-06-18 DIAGNOSIS — M5481 Occipital neuralgia: Secondary | ICD-10-CM

## 2019-06-18 MED ORDER — ZONISAMIDE 100 MG PO CAPS: 100.0000 mg | ORAL_CAPSULE | Freq: Every day | ORAL | 11 refills | Status: DC

## 2019-06-18 NOTE — Patient Instructions (Signed)
1.   Stop imipramine 2.   Start zonisamide. 3.   Take one of your alprazolam's at bedtime

## 2019-06-18 NOTE — Progress Notes (Signed)
[facility]  PATIENT: [name] DOB: December 18, 1955  REFERRING DOCTOR OR PCP:  Shelva Majestic SOURCE: patient and records form EMR and Dr. [name]  _________________________________   HISTORICAL  CHIEF COMPLAINT:  Chief Complaint  Patient presents with  . Follow-up    RM 12, alone. Last seen 05/24/18. Went to [facility] ED 06/07/19 for seizure.     HISTORY OF PRESENT ILLNESS:  [name] is a 63 year old woman with a h/o a generalized tonic-clonic seizure in 2016  with headaches and obstructive sleep apnea.  Update 7/6/22020: On 06/07/19,  her family called 911 as she was not waking up well.    She had bradycardia in the ED.   CBC, CMP and Ammonia and drug screen were fine.   CT showed unchanged mild atrophy and white matter changes but no acute findings.   She has had only one GTC seizure in 2016 but has had some zoned out spells.   This event had no GTC or incontinence.   She is on Keppra and never misses a dose.  She has a chronic headache at the top of her head sometimes associated with numbness in her face.   No nausea but she has photophobia and phonophobia.   She has been on imipramine x 1 month   She has OSA but uses CPAP nightly.   She does not sleep well.    Update 05/24/2018: She is noting right sided occipital pain and headache.   Pain is better laying down with a pillow under her neck.    Pain is constant with superimposed needle-like sensation.    She denies N/V.  She has some photophobia and phonophobia.   Ibuprofen takes the edge off for a couple hours.   She had an MRI of the brain and MRI of the cervical spine that were read as normal for age. Specifically the cervical MRI just shows a C6C7 bulge.     She has OSA and uses CPAP nightly.   Sometimes she takes it off if her grandson sleeps with her since he doesn't like her to wear it.  She will be getting permanent custody of her grandson.     She has not had any seizures since 2016.   She is on  Keppra and tolerates it well.    Update 09/13/2017:   She has had a mil daily headache x one month or more but much more severe the last week.    HA is right sided and throbbing.  She has no N/V.   She has photophobia > phonophobia.    Moving the head worsens the pain when more intense.    Ibuprofen takes the edge off for a few hours and then HA returns more intensely.    She went to the ED 09/04/2017 with ingtense HA and chest pain.    NTG w as given which worsened the HA.  She was admitted x 1 day and was given toradol, lorazepam, morphine and Zofran.  She had an MRI of the brain and MRI of the cervical spine that were read as normal for age. Specifically the cervical MRI just shows a C6C7 bulge.     She has only worn CPAP intermittently and we discussed trying to wear it the entire night.     She notes insomnia, sleep onset and sleep maintenance issues.   She laso has to move  She has not had any seizures since 2016   _____________________________________________________- From 05/19/2016 Seizure: She  denies any seizures since the last visit.   In August 2015, she had generalized tonic-clonic seizure activity that was witnessed by her husband. She has had no prior seizures and none since.   She continues is on Keppra.  An MRI of the brain at that time was essentially normal. An EEG was performed and showed 2 runs of generalized spike wave activity, consistent with a generalized seizure disorder. She is unaware of any family history of seizures.    She has not had head trauma in the past. She had a normal childhood development. She has not had any meningitis or encephalitis.  Pseudotumor cerebri/headache and neck pain:   Due to headaches and papilledema, she underwent a LP.    CSF pressure was elevated At 26.5 cm..   She had a lot of bladder issues on Diamox and stopped.   Headaches are mostly occipital.  However, headaches never returned to the severity or frequency that they were before the lumbar  puncture. The HA occurs 1 -2 times a week.  Ibuprofen often knocks the headache out, especially if she lays down.   Her blurry vision is better.    A recent eye exam reportedly just showed cataract.   Lightheadedness with standing up is also better.    She reports that her ophthalmologist told her that the optic nerve changes were due to cataracts.  Obstructive sleep apnea: About 4 or 5 years ago she was diagnosed with OSA with an overnight sleep study. She was started on CPAP +6 cm in the past.    However, she had trouble tolerating the mask and stopped.   She is claustophobic so seldom made it through the night with CPAP.    She reports some fatigue and daytime sleepiness.     REVIEW OF SYSTEMS: Constitutional: No fevers, chills, sweats, or change in appetite.   She notes some fatigue and sleepiness. Eyes: cataracts.    Improved blurriness Ear, nose and throat: No hearing loss, ear pain, nasal congestion, sore throat Cardiovascular: No chest pain, palpitations Respiratory: No shortness of breath at rest or with exertion.   Some cough and wheezes GastrointestinaI: No nausea, vomiting, diarrhea, abdominal pain, fecal incontinence Genitourinary: No dysuria, urinary retention or frequency.  No nocturia. Musculoskeletal: No neck pain, back pain Integumentary: No rash, pruritus, skin lesions Neurological: as above Psychiatric: No depression at this time.  No anxiety.  Notes increased stress at times. Endocrine: No palpitations, diaphoresis, change in appetite, change in weigh or increased thirst Hematologic/Lymphatic: No anemia, purpura, petechiae. Allergic/Immunologic: No itchy/runny eyes, nasal congestion, recent allergic reactions, rashes  ALLERGIES: Allergies  Allergen Reactions  . Aspirin Nausea And Vomiting  . Codeine Rash  . Latex Rash    HOME MEDICATIONS:  Current Outpatient Medications:  .  albuterol (PROVENTIL HFA;VENTOLIN HFA) 108 (90 Base) MCG/ACT inhaler, Inhale 1-2 puffs  into the lungs every 6 (six) hours as needed for wheezing or shortness of breath., Disp: , Rfl:  .  ALPRAZolam (XANAX) 0.5 MG tablet, Take 0.5 mg by mouth at bedtime. Prescribed twice daily but only take 1 tablet at bedtime, Disp: , Rfl:  .  atorvastatin (LIPITOR) 40 MG tablet, Take 40 mg by mouth at bedtime. , Disp: , Rfl:  .  clobetasol (TEMOVATE) 0.05 % external solution, Apply 1 application topically 2 (two) times daily as needed (to scalp for use at 1-2 weeks at one time). , Disp: , Rfl: 2 .  Fluocinolone Acetonide Scalp 0.01 % OIL, Apply 1 application topically  daily as needed (applied to scalp for psoriasis). , Disp: , Rfl:  .  gabapentin (NEURONTIN) 100 MG capsule, Take 100 mg by mouth at bedtime., Disp: , Rfl:  .  ibuprofen (ADVIL,MOTRIN) 200 MG tablet, Take 200-400 mg by mouth daily as needed for mild pain or moderate pain. , Disp: , Rfl:  .  imipramine (TOFRANIL) 50 MG tablet, TAKE ONE TABLET BY MOUTH EVERY NIGHT AT BEDTIME, Disp: 30 tablet, Rfl: 11 .  levETIRAcetam (KEPPRA) 500 MG tablet, TAKE ONE TABLET BY MOUTH TWICE DAILY, Disp: 60 tablet, Rfl: 1 .  levothyroxine (SYNTHROID, LEVOTHROID) 100 MCG tablet, Take 100 mcg by mouth every morning. , Disp: , Rfl:  .  metFORMIN (GLUCOPHAGE) 500 MG tablet, Take 500 mg by mouth 2 (two) times daily with a meal. , Disp: , Rfl:  .  metoprolol (LOPRESSOR) 50 MG tablet, Take 50 mg by mouth 2 (two) times daily.  , Disp: , Rfl:  .  nystatin cream (MYCOSTATIN), APPLY TO AFFECTED AREA(S) TWICE DAILY (Patient taking differently: Apply 1 application topically 2 (two) times daily as needed for dry skin. ), Disp: 30 g, Rfl: 11 .  oxybutynin (DITROPAN) 5 MG tablet, TAKE TWO TABLETS BY MOUTH EVERY MORNING and TAKE ONE TABLET BY MOUTH every day at noon and TAKE ONE TABLET BY MOUTH EVERY NIGHT AT BEDTIME, Disp: 120 tablet, Rfl: 12 .  sertraline (ZOLOFT) 50 MG tablet, Take 50 mg by mouth at bedtime., Disp: , Rfl:  .  triamcinolone cream (KENALOG) 0.1 %, APPLY TO  AFFECTED AREA(S) TWICE DAILY (Patient taking differently: Apply 1 application topically 2 (two) times daily as needed (for irritation). ), Disp: 30 g, Rfl: 11  PAST MEDICAL HISTORY: Past Medical History:  Diagnosis Date  . Asthma   . Chronic back pain   . Chronic leg pain    Bilateral  . Chronic pelvic pain in female   . Essential hypertension   . Fibromyalgia   . H/O cardiac catheterization    No significant CAD (only 20% LAD) 03/02/13 with normal LV function.  Marland Kitchen Headache   . High cholesterol   . Hypothyroidism   . Mental disorder   . OAB (overactive bladder) 05/21/2013  . Obstructive sleep apnea    Noncompliant with CPAP due to claustophobia.  . Panic attacks   . Rectocele 05/21/2013  . Seizures (HCC)    1 severe seizure 2015; has had "small ones" since including 9/18  . Type 2 diabetes mellitus (HCC)     PAST SURGICAL HISTORY: Past Surgical History:  Procedure Laterality Date  . ABDOMINAL HYSTERECTOMY    . ANKLE SURGERY    . BACK SURGERY    . CARPAL TUNNEL RELEASE    . knee replacements     bilateral  . LEFT HEART CATHETERIZATION WITH CORONARY ANGIOGRAM N/A 03/02/2013   Procedure: LEFT HEART CATHETERIZATION WITH CORONARY ANGIOGRAM;  Surgeon: Peter M Swaziland, MD;  Location: [facility] CATH LAB;  Service: Cardiovascular;  Laterality: N/A;  . TONSILLECTOMY    . TUBAL LIGATION      FAMILY HISTORY: Family History  Problem Relation Age of Onset  . Heart attack Mother   . Diabetes Mother   . Hypertension Mother   . Sick sinus syndrome Brother        Pacemaker  . Congenital heart disease Brother   . Stroke Brother   . Coronary artery disease Brother   . Breast cancer Maternal Aunt   . Cancer Cousin  leukemia    SOCIAL HISTORY:  Social History   Socioeconomic History  . Marital status: Married    Spouse name: Not on file  . Number of children: Not on file  . Years of education: Not on file  . Highest education level: Not on file  Occupational History  .  Occupation: disabled  Social Needs  . Financial resource strain: Not on file  . Food insecurity    Worry: Not on file    Inability: Not on file  . Transportation needs    Medical: Not on file    Non-medical: Not on file  Tobacco Use  . Smoking status: Former Smoker    Packs/day: 1.00    Years: 2.00    Pack years: 2.00    Types: Cigarettes    Quit date: 12/13/1974    Years since quitting: 44.5  . Smokeless tobacco: Never Used  Substance and Sexual Activity  . Alcohol use: No  . Drug use: No  . Sexual activity: Not Currently    Birth control/protection: Surgical    Comment: hyst  Lifestyle  . Physical activity    Days per week: Not on file    Minutes per session: Not on file  . Stress: Not on file  Relationships  . Social Musicianconnections    Talks on phone: Not on file    Gets together: Not on file    Attends religious service: Not on file    Active member of club or organization: Not on file    Attends meetings of clubs or organizations: Not on file    Relationship status: Not on file  . Intimate partner violence    Fear of current or ex partner: Not on file    Emotionally abused: Not on file    Physically abused: Not on file    Forced sexual activity: Not on file  Other Topics Concern  . Not on file  Social History Narrative  . Not on file     PHYSICAL EXAM  Vitals:   06/18/19 1257  BP: 139/84  Pulse: (!) 52  Temp: 98 F (36.7 C)  Weight: 261 lb 8 oz (118.6 kg)  Height: 5\' 7"  (1.702 m)    Body mass index is 40.96 kg/m.   General: The patient is an obese woman in no acute distress  Eyes:  Funduscopic exam shows normal optic disks..  Normal retinal vessels.   She has venous pulsations  Neck: The neck is supple, no carotid bruits are noted.  She has right > left occipital tenderness over nerves and splenius capitus muscle.    Neurologic Exam  Mental status: The patient is alert and oriented x 3 at the time of the examination. The patient has apparent  normal recent and remote memory, with an apparently normal attention span and concentration ability.   Speech is normal.  Cranial nerves: Extraocular movements are full. Pupils are equal, round, and reactive to light and accomodation.   Facial strength and sensation is normal. Trapezius strength is normal.    No obvious hearing deficits are noted.  Motor:  Muscle bulk is normal.   Tone is normal. Strength is  5 / 5 in all 4 extremities.   Sensory: Sensory testing is intact to touch and vibration in the hands.  Reduced vibration in toes.   Coordination: Cerebellar testing reveals good finger-nose-finger and heel-to-shin bilaterally.  Gait and station: Station is normal.   Gait is and Tandem gait are mildly wide.. Romberg is  negative.   Reflexes: Deep tendon reflexes are symmetric and normal bilaterally in arms and knees and absent ankle DTR.    \   DIAGNOSTIC DATA (LABS, IMAGING, TESTING) - I reviewed patient records, labs, notes, testing and imaging myself where available.  Lab Results  Component Value Date   WBC 4.8 06/07/2019   HGB 13.3 06/07/2019   HCT 42.7 06/07/2019   MCV 91.6 06/07/2019   PLT 173 06/07/2019      Component Value Date/Time   NA 139 06/07/2019 1452   K 4.8 06/07/2019 1452   CL 103 06/07/2019 1452   CO2 27 06/07/2019 1452   GLUCOSE 93 06/07/2019 1452   BUN 13 06/07/2019 1452   CREATININE 0.78 06/07/2019 1452   CALCIUM 9.0 06/07/2019 1452   PROT 6.8 06/07/2019 1452   ALBUMIN 3.9 06/07/2019 1452   AST 23 06/07/2019 1452   ALT 20 06/07/2019 1452   ALKPHOS 74 06/07/2019 1452   BILITOT 0.9 06/07/2019 1452   GFRNONAA >60 06/07/2019 1452   GFRAA >60 06/07/2019 1452   Lab Results  Component Value Date   CHOL 119 09/05/2017   HDL 40 (L) 09/05/2017   LDLCALC 65 09/05/2017   TRIG 69 09/05/2017   CHOLHDL 3.0 09/05/2017   Lab Results  Component Value Date   HGBA1C 5.3 09/04/2017      ASSESSMENT AND PLAN   1. Epilepsy, generalized, convulsive (HCC)    2. Idiopathic intracranial hypertension   3. Obstructive sleep apnea   4. Chronic intractable headache, unspecified headache type   5. Transient alteration of awareness   6. Occipital neuralgia of right side   7. Neck pain   8. Insomnia, unspecified type     1.   Keppra 500 mg p.o. twice daily for her seizures and headache.  Add zonisamide 100 mg that may help both.  Stop imipramine.   Check EEG 2.   Bilateral splenius capitis trigger point injection with 80 mg Depo-Medrol and 3 cc Marcaine using sterile technique.  She tolerated the procedure well with no complications. 3   try to wear CPAP nightly.  4.   she will return to see me in 6 months or sooner if there are new or worsening neurologic symptoms.   Kepler Mccabe A. Epimenio FootSater, MD, PhD 06/18/2019, 12:59 PM Certified in Neurology, Clinical Neurophysiology, Sleep Medicine, Pain Medicine and Neuroimaging  [facility] [address], [address] [place], [place] [address] [phone]

## 2019-06-22 ENCOUNTER — Other Ambulatory Visit: Payer: Self-pay | Admitting: Neurology

## 2019-06-27 ENCOUNTER — Other Ambulatory Visit (HOSPITAL_COMMUNITY): Payer: Self-pay | Admitting: Adult Health

## 2019-06-27 ENCOUNTER — Other Ambulatory Visit: Payer: Medicare Other

## 2019-06-27 DIAGNOSIS — Z1231 Encounter for screening mammogram for malignant neoplasm of breast: Secondary | ICD-10-CM

## 2019-07-02 ENCOUNTER — Encounter (HOSPITAL_COMMUNITY): Payer: Self-pay

## 2019-07-02 ENCOUNTER — Other Ambulatory Visit: Payer: Self-pay

## 2019-07-02 ENCOUNTER — Ambulatory Visit (HOSPITAL_COMMUNITY)
Admission: RE | Admit: 2019-07-02 | Discharge: 2019-07-02 | Disposition: A | Discharge status: Home / Self Care | Payer: Medicare Other | Source: Ambulatory Visit | Attending: Adult Health | Admitting: Adult Health

## 2019-07-02 DIAGNOSIS — Z1231 Encounter for screening mammogram for malignant neoplasm of breast: Secondary | ICD-10-CM | POA: Insufficient documentation

## 2019-08-13 DIAGNOSIS — E1165 Type 2 diabetes mellitus with hyperglycemia: Secondary | ICD-10-CM | POA: Diagnosis not present

## 2019-08-13 DIAGNOSIS — E1121 Type 2 diabetes mellitus with diabetic nephropathy: Secondary | ICD-10-CM | POA: Diagnosis not present

## 2019-08-13 DIAGNOSIS — I10 Essential (primary) hypertension: Secondary | ICD-10-CM | POA: Diagnosis not present

## 2019-08-13 DIAGNOSIS — E039 Hypothyroidism, unspecified: Secondary | ICD-10-CM | POA: Diagnosis not present

## 2019-08-21 ENCOUNTER — Telehealth: Payer: Self-pay

## 2019-08-21 DIAGNOSIS — E1165 Type 2 diabetes mellitus with hyperglycemia: Secondary | ICD-10-CM | POA: Diagnosis not present

## 2019-08-21 DIAGNOSIS — E1121 Type 2 diabetes mellitus with diabetic nephropathy: Secondary | ICD-10-CM | POA: Diagnosis not present

## 2019-08-21 DIAGNOSIS — E039 Hypothyroidism, unspecified: Secondary | ICD-10-CM | POA: Diagnosis not present

## 2019-08-21 DIAGNOSIS — I10 Essential (primary) hypertension: Secondary | ICD-10-CM | POA: Diagnosis not present

## 2019-08-21 MED ORDER — ZONISAMIDE 100 MG PO CAPS: 100.0000 mg | ORAL_CAPSULE | Freq: Every day | ORAL | 11 refills | Status: DC

## 2019-08-21 MED ORDER — LEVETIRACETAM 500 MG PO TABS: 500.0000 mg | ORAL_TABLET | Freq: Two times a day (BID) | ORAL | 5 refills | Status: DC

## 2019-08-21 NOTE — Telephone Encounter (Signed)
Received a call from Ria Comment stating that the patient has switched pharmacies and will need to have her prescription faxed over.   [facility]  Tel: [phone]  Fax: [phone]

## 2019-08-21 NOTE — Telephone Encounter (Signed)
E-scribed rx's to [facility]

## 2019-08-21 NOTE — Addendum Note (Signed)
Addended by: Hope Pigeon on: 08/21/2019 05:21 PM   Modules accepted: Orders

## 2019-08-21 NOTE — Telephone Encounter (Signed)
Messaged [place] to clarify which rx's: zonegran and keppra

## 2019-08-24 ENCOUNTER — Other Ambulatory Visit: Payer: Self-pay | Admitting: Obstetrics & Gynecology

## 2019-08-29 ENCOUNTER — Telehealth: Payer: Self-pay | Admitting: *Deleted

## 2019-08-29 NOTE — Telephone Encounter (Signed)
Opened in error

## 2019-09-05 ENCOUNTER — Other Ambulatory Visit: Payer: Self-pay

## 2019-09-05 ENCOUNTER — Emergency Department (HOSPITAL_COMMUNITY): Payer: Medicare Other

## 2019-09-05 ENCOUNTER — Encounter (HOSPITAL_COMMUNITY): Payer: Self-pay | Admitting: Emergency Medicine

## 2019-09-05 DIAGNOSIS — E119 Type 2 diabetes mellitus without complications: Secondary | ICD-10-CM | POA: Diagnosis not present

## 2019-09-05 DIAGNOSIS — Z87891 Personal history of nicotine dependence: Secondary | ICD-10-CM | POA: Diagnosis not present

## 2019-09-05 DIAGNOSIS — R0789 Other chest pain: Secondary | ICD-10-CM | POA: Insufficient documentation

## 2019-09-05 DIAGNOSIS — J45909 Unspecified asthma, uncomplicated: Secondary | ICD-10-CM | POA: Diagnosis not present

## 2019-09-05 DIAGNOSIS — R079 Chest pain, unspecified: Secondary | ICD-10-CM | POA: Diagnosis not present

## 2019-09-05 DIAGNOSIS — Z91040 Latex allergy status: Secondary | ICD-10-CM | POA: Insufficient documentation

## 2019-09-05 DIAGNOSIS — F439 Reaction to severe stress, unspecified: Secondary | ICD-10-CM | POA: Insufficient documentation

## 2019-09-05 DIAGNOSIS — R0602 Shortness of breath: Secondary | ICD-10-CM | POA: Diagnosis not present

## 2019-09-05 DIAGNOSIS — Z79899 Other long term (current) drug therapy: Secondary | ICD-10-CM | POA: Diagnosis not present

## 2019-09-05 DIAGNOSIS — E039 Hypothyroidism, unspecified: Secondary | ICD-10-CM | POA: Diagnosis not present

## 2019-09-05 DIAGNOSIS — I10 Essential (primary) hypertension: Secondary | ICD-10-CM | POA: Insufficient documentation

## 2019-09-05 DIAGNOSIS — Z733 Stress, not elsewhere classified: Secondary | ICD-10-CM | POA: Diagnosis not present

## 2019-09-05 MED ORDER — SODIUM CHLORIDE 0.9% FLUSH: 3.0000 mL | Freq: Once | INTRAVENOUS | Status: DC

## 2019-09-05 NOTE — ED Triage Notes (Signed)
Pt C/O chest pain that began 2 hours ago after an argument with family. Pt denies N/V and lightheadedness.

## 2019-09-06 ENCOUNTER — Emergency Department (HOSPITAL_COMMUNITY)
Admission: EM | Admit: 2019-09-06 | Discharge: 2019-09-06 | Disposition: A | Discharge status: Home / Self Care | Payer: Medicare Other | Attending: Emergency Medicine | Admitting: Emergency Medicine

## 2019-09-06 DIAGNOSIS — R0789 Other chest pain: Secondary | ICD-10-CM | POA: Diagnosis not present

## 2019-09-06 DIAGNOSIS — F439 Reaction to severe stress, unspecified: Secondary | ICD-10-CM

## 2019-09-06 LAB — BASIC METABOLIC PANEL
Anion gap: 14 (ref 5–15)
BUN: 17 mg/dL (ref 8–23)
CO2: 20 mmol/L — ABNORMAL LOW (ref 22–32)
Calcium: 9.6 mg/dL (ref 8.9–10.3)
Chloride: 105 mmol/L (ref 98–111)
Creatinine, Ser: 0.94 mg/dL (ref 0.44–1.00)
GFR calc Af Amer: >60 mL/min (ref >60)
GFR calc non Af Amer: >60 mL/min (ref >60)
Glucose, Bld: 96 mg/dL (ref 70–99)
Potassium: 4.4 mmol/L (ref 3.5–5.1)
Sodium: 139 mmol/L (ref 135–145)

## 2019-09-06 LAB — CBC
HCT: 47.1 % — ABNORMAL HIGH (ref 36.0–46.0)
Hemoglobin: 14.5 g/dL (ref 12.0–15.0)
MCH: 28.6 pg (ref 26.0–34.0)
MCHC: 30.8 g/dL (ref 30.0–36.0)
MCV: 92.9 fL (ref 80.0–100.0)
Platelets: 210 10*3/uL (ref 150–400)
RBC: 5.07 MIL/uL (ref 3.87–5.11)
RDW: 13.1 % (ref 11.5–15.5)
WBC: 7.9 10*3/uL (ref 4.0–10.5)
nRBC: 0 % (ref 0.0–0.2)

## 2019-09-06 LAB — TROPONIN I (HIGH SENSITIVITY)
Troponin I (High Sensitivity): <2 ng/L (ref <18)
Troponin I (High Sensitivity): <2 ng/L (ref <18)

## 2019-09-06 NOTE — ED Provider Notes (Signed)
[facility] EMERGENCY DEPARTMENT Provider Note   CSN: [id number] Arrival date & time: 09/05/19  2309     History   Chief Complaint Chief Complaint  Patient presents with  . Chest Pain    HPI [name] is a 63 y.o. female.  HPI: A 63 year old patient with a history of treated diabetes, hypertension and hypercholesterolemia presents for evaluation of chest pain. Initial onset of pain was approximately 1-3 hours ago. The patient's chest pain is sharp and is not worse with exertion. The patient's chest pain is not middle- or left-sided, is not well-localized, is not described as heaviness/pressure/tightness and does not radiate to the arms/jaw/neck. The patient does not complain of nausea and denies diaphoresis. The patient has no history of stroke, has no history of peripheral artery disease, has not smoked in the past 90 days, has no relevant family history of coronary artery disease (first degree relative at less than age 71) and does not have an elevated BMI (>=30).   Patient presents to the emergency department for evaluation of chest pain.  Patient reports that she was having an argument with a family member when chest pain developed.  She reports that it was a sharp pain in the left chest and it is easing off now.  She has had similar pains with emotional stress in the past but is concerned because her mother died of a heart attack.  She does not have any previous history of heart disease herself.     Past Medical History:  Diagnosis Date  . Asthma   . Chronic back pain   . Chronic leg pain    Bilateral  . Chronic pelvic pain in female   . Essential hypertension   . Fibromyalgia   . H/O cardiac catheterization    No significant CAD (only 20% LAD) 03/02/13 with normal LV function.  Marland Kitchen Headache   . High cholesterol   . Hypothyroidism   . Mental disorder   . OAB (overactive bladder) 05/21/2013  . Obstructive sleep apnea    Noncompliant with CPAP due to claustophobia.  .  Panic attacks   . Rectocele 05/21/2013  . Seizures (HCC)    1 severe seizure 2015; has had "small ones" since including 9/18  . Type 2 diabetes mellitus [facility])     Patient Active Problem List   Diagnosis Date Noted  . SUI (stress urinary incontinence, female) 05/05/2018  . Screening for colorectal cancer 05/05/2018  . Encounter for well woman exam with routine gynecological exam 05/05/2018  . Breast pain, right 05/05/2018  . Insomnia 09/13/2017  . Restless leg 09/13/2017  . Superficial fungus infection of skin 05/04/2017  . Bruising 10/14/2016  . Chest pain 10/04/2016  . Hypothyroidism 10/04/2016  . Idiopathic intracranial hypertension 11/19/2015  . Papilledema 09/08/2015  . Left facial numbness 08/22/2015  . Epilepsy, generalized, convulsive (HCC) 06/05/2015  . Obstructive sleep apnea 06/05/2015  . Headache, occipital 06/05/2015  . Neck pain 06/05/2015  . Occipital neuralgia of right side 06/05/2015  . Seizure (HCC) 07/21/2014  . Rectocele 05/21/2013  . OAB (overactive bladder) 05/21/2013  . Difficulty in walking(719.7) 04/11/2013  . Leg weakness, bilateral 04/11/2013  . Diabetes (HCC) 02/28/2013  . Morbid obesity (HCC) 02/28/2013  . Hyperlipidemia 02/28/2013  . CHRONIC OSTEOMYELITIS ANKLE AND FOOT 07/22/2009  . Hypertension 07/22/2009    Past Surgical History:  Procedure Laterality Date  . ABDOMINAL HYSTERECTOMY    . ANKLE SURGERY    . BACK SURGERY    . CARPAL  TUNNEL RELEASE    . knee replacements     bilateral  . LEFT HEART CATHETERIZATION WITH CORONARY ANGIOGRAM N/A 03/02/2013   Procedure: LEFT HEART CATHETERIZATION WITH CORONARY ANGIOGRAM;  Surgeon: Peter M Martinique, MD;  Location: [facility] CATH LAB;  Service: Cardiovascular;  Laterality: N/A;  . TONSILLECTOMY    . TUBAL LIGATION       OB History    Gravida  2   Para  2   Term  2   Preterm      AB      Living  2     SAB      TAB      Ectopic      Multiple      Live Births  2            Home  Medications    Prior to Admission medications   Medication Sig Start Date End Date Taking? Authorizing Provider  albuterol (PROVENTIL HFA;VENTOLIN HFA) 108 (90 Base) MCG/ACT inhaler Inhale 1-2 puffs into the lungs every 6 (six) hours as needed for wheezing or shortness of breath.    [provider]  ALPRAZolam Duanne Moron) 0.5 MG tablet Take 0.5 mg by mouth at bedtime. Prescribed twice daily but only take 1 tablet at bedtime 04/17/19   [provider]  atorvastatin (LIPITOR) 40 MG tablet Take 40 mg by mouth at bedtime.  10/15/14   [provider]  clobetasol (TEMOVATE) 0.05 % external solution Apply 1 application topically 2 (two) times daily as needed (to scalp for use at 1-2 weeks at one time).  04/04/18   [provider]  Fluocinolone Acetonide Scalp 0.01 % OIL Apply 1 application topically daily as needed (applied to scalp for psoriasis).     [provider]  gabapentin (NEURONTIN) 100 MG capsule Take 100 mg by mouth at bedtime. 04/17/19   [provider]  ibuprofen (ADVIL,MOTRIN) 200 MG tablet Take 200-400 mg by mouth daily as needed for mild pain or moderate pain.     [provider]  levETIRAcetam (KEPPRA) 500 MG tablet Take 1 tablet (500 mg total) by mouth 2 (two) times daily. 08/21/19   Sater, Nanine Means, MD  levothyroxine (SYNTHROID, LEVOTHROID) 100 MCG tablet Take 100 mcg by mouth every morning.  12/31/16   [provider]  metFORMIN (GLUCOPHAGE) 500 MG tablet Take 500 mg by mouth 2 (two) times daily with a meal.  03/26/14   [provider]  metoprolol (LOPRESSOR) 50 MG tablet Take 50 mg by mouth 2 (two) times daily.      [provider]  nystatin cream (MYCOSTATIN) APPLY TO AFFECTED AREA(S) TWICE DAILY Patient taking differently: Apply 1 application topically 2 (two) times daily as needed for dry skin.  09/27/18   Florian Buff, MD  oxybutynin (DITROPAN) 5 MG tablet TAKE 1 TABLET BY MOUTH TWICE DAILY FOR BLADDER.  FXD DR FOR NEW SCRIPT. NOT THE SCRIPT. 08/24/19   Florian Buff, MD  sertraline (ZOLOFT) 50 MG tablet Take 50 mg by mouth at bedtime.    [provider]  triamcinolone cream (KENALOG) 0.1 % APPLY TO AFFECTED AREA(S) TWICE DAILY Patient taking differently: Apply 1 application topically 2 (two) times daily as needed (for irritation).  09/27/18   Florian Buff, MD  zonisamide (ZONEGRAN) 100 MG capsule Take 1 capsule (100 mg total) by mouth daily. 08/21/19   Sater, Nanine Means, MD    Family History Family History  Problem Relation Age of Onset  .  Heart attack Mother   . Diabetes Mother   . Hypertension Mother   . Sick sinus syndrome Brother        Pacemaker  . Congenital heart disease Brother   . Stroke Brother   . Coronary artery disease Brother   . Breast cancer Maternal Aunt   . Cancer Cousin        leukemia    Social History Social History   Tobacco Use  . Smoking status: Former Smoker    Packs/day: 1.00    Years: 2.00    Pack years: 2.00    Types: Cigarettes    Quit date: 12/13/1974    Years since quitting: 44.7  . Smokeless tobacco: Never Used  Substance Use Topics  . Alcohol use: No  . Drug use: No     Allergies   Aspirin, Codeine, and Latex   Review of Systems Review of Systems  Cardiovascular: Positive for chest pain.  All other systems reviewed and are negative.    Physical Exam Updated Vital Signs BP (!) 143/73 (BP Location: Right Arm)   Pulse 69   Temp 97.9 F (36.6 C) (Oral)   Resp 17   Wt 113.4 kg   SpO2 99%   BMI 39.16 kg/m   Physical Exam Vitals signs and nursing note reviewed.  Constitutional:      General: She is not in acute distress.    Appearance: Normal appearance. She is well-developed.  HENT:     Head: Normocephalic and atraumatic.     Right Ear: Hearing normal.     Left Ear: Hearing normal.     Nose: Nose normal.  Eyes:     Conjunctiva/sclera: Conjunctivae normal.     Pupils: Pupils are equal, round, and reactive to  light.  Neck:     Musculoskeletal: Normal range of motion and neck supple.  Cardiovascular:     Rate and Rhythm: Regular rhythm.     Heart sounds: S1 normal and S2 normal. No murmur. No friction rub. No gallop.   Pulmonary:     Effort: Pulmonary effort is normal. No respiratory distress.     Breath sounds: Normal breath sounds.  Chest:     Chest wall: No tenderness.  Abdominal:     General: Bowel sounds are normal.     Palpations: Abdomen is soft.     Tenderness: There is no abdominal tenderness. There is no guarding or rebound. Negative signs include Murphy's sign and McBurney's sign.     Hernia: No hernia is present.  Musculoskeletal: Normal range of motion.  Skin:    General: Skin is warm and dry.     Findings: No rash.  Neurological:     Mental Status: She is alert and oriented to person, place, and time.     GCS: GCS eye subscore is 4. GCS verbal subscore is 5. GCS motor subscore is 6.     Cranial Nerves: No cranial nerve deficit.     Sensory: No sensory deficit.     Coordination: Coordination normal.  Psychiatric:        Speech: Speech normal.        Behavior: Behavior normal.        Thought Content: Thought content normal.      ED Treatments / Results  Labs (all labs ordered are listed, but only abnormal results are displayed) Labs Reviewed  BASIC METABOLIC PANEL - Abnormal; Notable for the following components:      Result Value   CO2 20 (*)  All other components within normal limits  CBC - Abnormal; Notable for the following components:   HCT 47.1 (*)    All other components within normal limits  TROPONIN I (HIGH SENSITIVITY)  TROPONIN I (HIGH SENSITIVITY)    EKG EKG Interpretation  Date/Time:  Wednesday September 05 2019 23:23:21 EDT Ventricular Rate:  64 PR Interval:  186 QRS Duration: 86 QT Interval:  424 QTC Calculation: 437 R Axis:   5 Text Interpretation:  Normal sinus rhythm Normal ECG Confirmed by Gilda Creaseollina,  J [phone]([id number]) on  09/05/2019 11:46:39 PM   Radiology Dg Chest 2 View  Result Date: 09/06/2019 CLINICAL DATA:  Chest pain. Shortness of breath. EXAM: CHEST - 2 VIEW COMPARISON:  12/21/2018 FINDINGS: The cardiomediastinal contours are normal. The lungs are clear. Pulmonary vasculature is normal. No consolidation, pleural effusion, or pneumothorax. No acute osseous abnormalities are seen. Degenerative change in the spine. IMPRESSION: No acute chest finding. Electronically Signed   By: Narda RutherfordMelanie  Sanford M.D.   On: 09/06/2019 00:12    Procedures Procedures (including critical care time)  Medications Ordered in ED Medications  sodium chloride flush (NS) 0.9 % injection 3 mL (has no administration in time range)     Initial Impression / Assessment and Plan / ED Course  I have reviewed the triage vital signs and the nursing notes.  Pertinent labs & imaging results that were available during my care of the patient were reviewed by me and considered in my medical decision making (see chart for details).     HEAR Score: 3 Patient presents to the emergency department for evaluation of chest pain.  Patient had onset of chest pain when she was arguing with family members.  No associated symptoms.  No history of cardiac disease but does have a family history.  Pain was sharp in nature and resolved without intervention.  This is atypical.  She has had a cardiac catheterization in 2014 that did not show any coronary artery disease.  This is reassuring as well. HEAR Score = 3, low risk.  2 troponins are negative, recommendation is discharge and routine follow-up.   Final Clinical Impressions(s) / ED Diagnoses   Final diagnoses:  Atypical chest pain  Stress    ED Discharge Orders    None       Gilda CreasePollina,  J, MD 09/06/19 [phone]

## 2019-09-12 DIAGNOSIS — M542 Cervicalgia: Secondary | ICD-10-CM | POA: Diagnosis not present

## 2019-09-17 DIAGNOSIS — E1169 Type 2 diabetes mellitus with other specified complication: Secondary | ICD-10-CM | POA: Diagnosis not present

## 2019-09-17 DIAGNOSIS — I10 Essential (primary) hypertension: Secondary | ICD-10-CM | POA: Diagnosis not present

## 2019-09-17 DIAGNOSIS — E039 Hypothyroidism, unspecified: Secondary | ICD-10-CM | POA: Diagnosis not present

## 2019-09-17 DIAGNOSIS — E1121 Type 2 diabetes mellitus with diabetic nephropathy: Secondary | ICD-10-CM | POA: Diagnosis not present

## 2019-09-17 DIAGNOSIS — E1165 Type 2 diabetes mellitus with hyperglycemia: Secondary | ICD-10-CM | POA: Diagnosis not present

## 2019-09-20 DIAGNOSIS — E1140 Type 2 diabetes mellitus with diabetic neuropathy, unspecified: Secondary | ICD-10-CM | POA: Diagnosis not present

## 2019-09-20 DIAGNOSIS — I10 Essential (primary) hypertension: Secondary | ICD-10-CM | POA: Diagnosis not present

## 2019-09-20 DIAGNOSIS — E782 Mixed hyperlipidemia: Secondary | ICD-10-CM | POA: Diagnosis not present

## 2019-09-20 DIAGNOSIS — R945 Abnormal results of liver function studies: Secondary | ICD-10-CM | POA: Diagnosis not present

## 2019-09-20 DIAGNOSIS — K760 Fatty (change of) liver, not elsewhere classified: Secondary | ICD-10-CM | POA: Diagnosis not present

## 2019-09-28 DIAGNOSIS — E039 Hypothyroidism, unspecified: Secondary | ICD-10-CM | POA: Diagnosis not present

## 2019-09-28 DIAGNOSIS — I10 Essential (primary) hypertension: Secondary | ICD-10-CM | POA: Diagnosis not present

## 2019-09-28 DIAGNOSIS — E1121 Type 2 diabetes mellitus with diabetic nephropathy: Secondary | ICD-10-CM | POA: Diagnosis not present

## 2019-09-28 DIAGNOSIS — E1165 Type 2 diabetes mellitus with hyperglycemia: Secondary | ICD-10-CM | POA: Diagnosis not present

## 2019-10-02 ENCOUNTER — Other Ambulatory Visit: Payer: Self-pay | Admitting: Obstetrics & Gynecology

## 2019-10-04 ENCOUNTER — Other Ambulatory Visit: Payer: Self-pay

## 2019-10-04 NOTE — Patient Outreach (Signed)
[facility]) Care Management  10/04/2019  ALICIANA RICCIARDI 1956-01-18 [id number]   Medication Adherence call to Mrs. Seabron Spates [facility] Identifiers Verify spoke with patient she is past due on Atorvastatin 40 mg and Metformin Er 500 mg patient explain she take 1 tablet daily,patient is now receiving it thru a new pharmacy every 29 th of the month on a pill pack,patient is expecting an order.Mrs. Dadamo is showing past due under [facility].   [facility] Management Direct Dial [phone]  Fax [phone] [email or web address]

## 2019-10-29 DIAGNOSIS — E1121 Type 2 diabetes mellitus with diabetic nephropathy: Secondary | ICD-10-CM | POA: Diagnosis not present

## 2019-10-29 DIAGNOSIS — I10 Essential (primary) hypertension: Secondary | ICD-10-CM | POA: Diagnosis not present

## 2019-11-13 IMAGING — MG DIGITAL SCREENING BILATERAL MAMMOGRAM WITH TOMO AND CAD
6 of 10 series · 6 of 30 positions shown · non-contrast
Comparison: Previous exam(s).

ACR Breast Density Category a: The breast tissue is almost entirely
fatty.

CLINICAL DATA: Screening.

EXAM:
DIGITAL SCREENING BILATERAL MAMMOGRAM WITH TOMO AND CAD

[R CC synth-2D (1 of 2)]
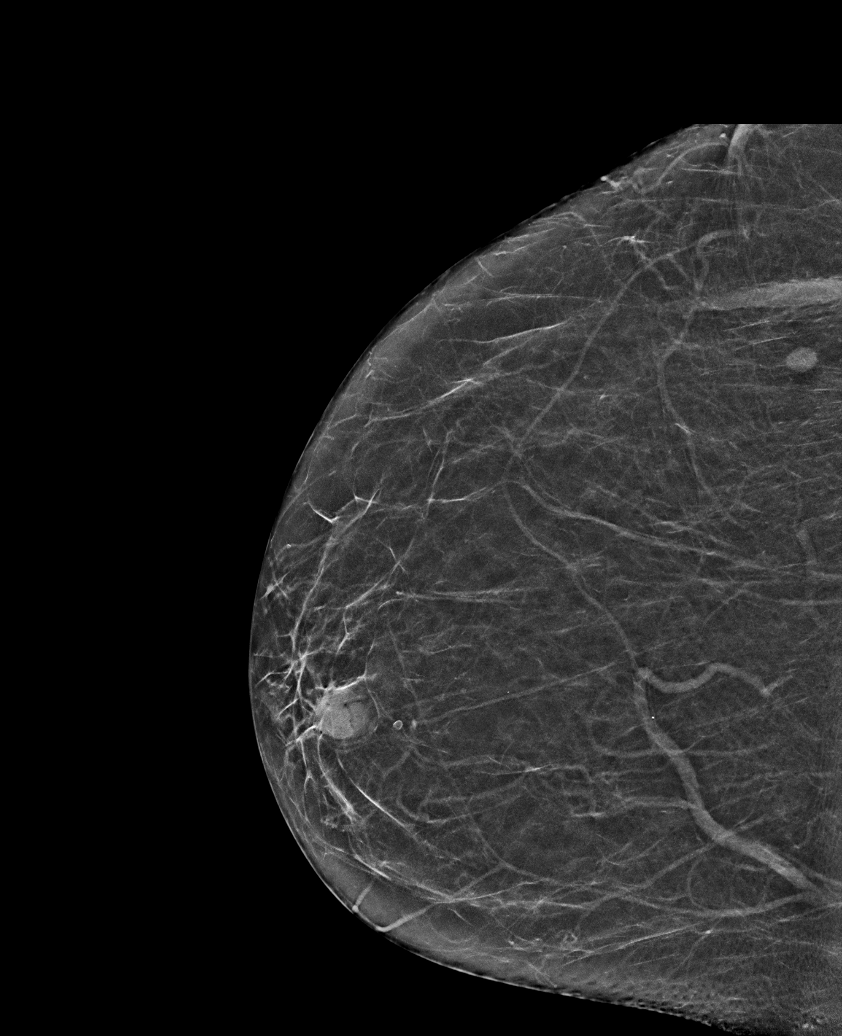

[R CC synth-2D (2 of 2)]
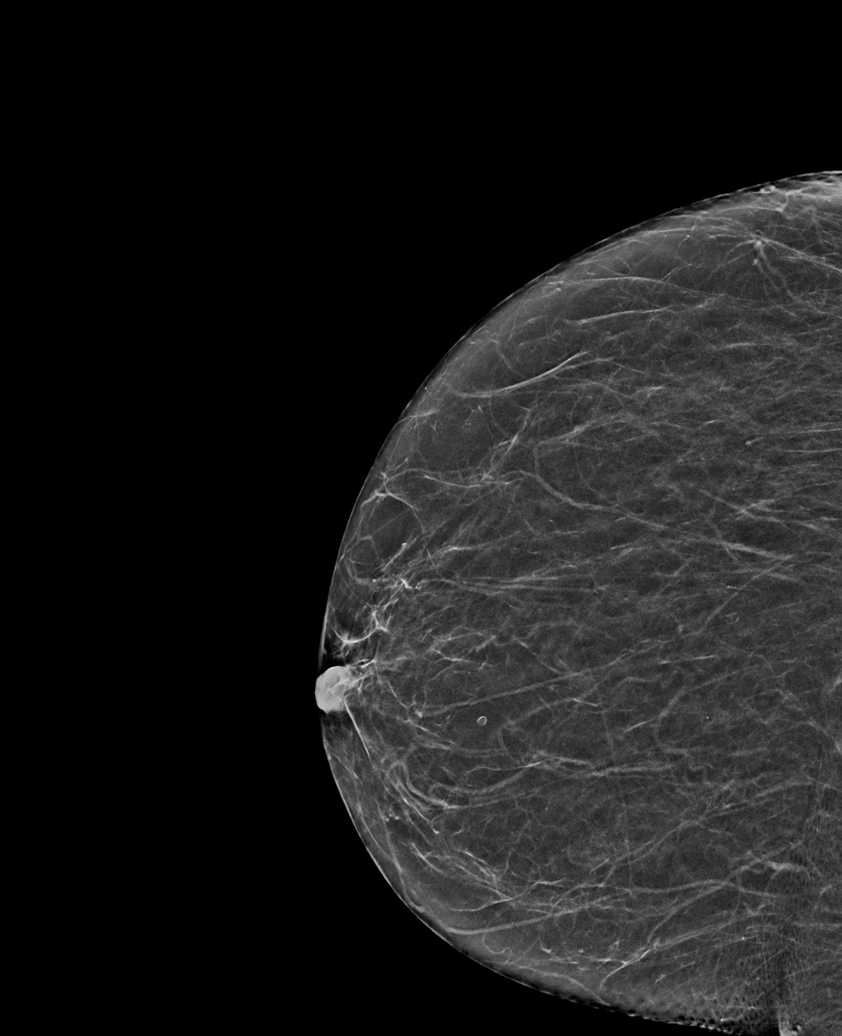

[L CC synth-2D]
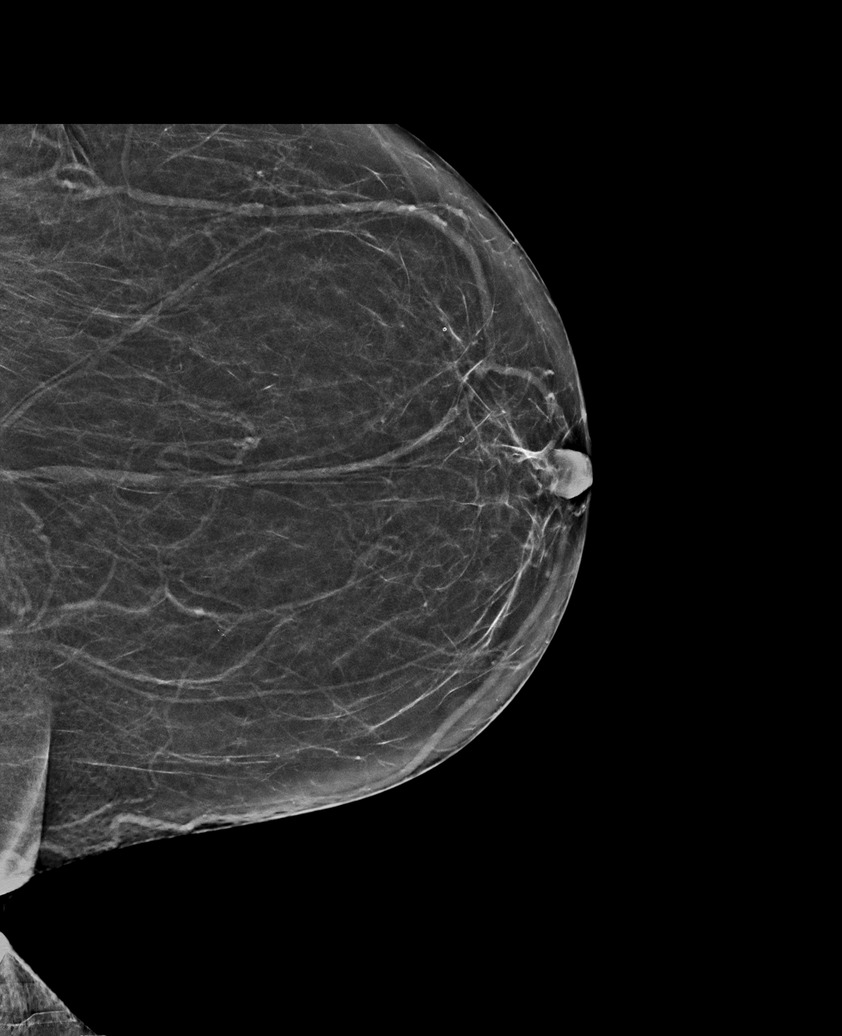

[R MLO synth-2D]
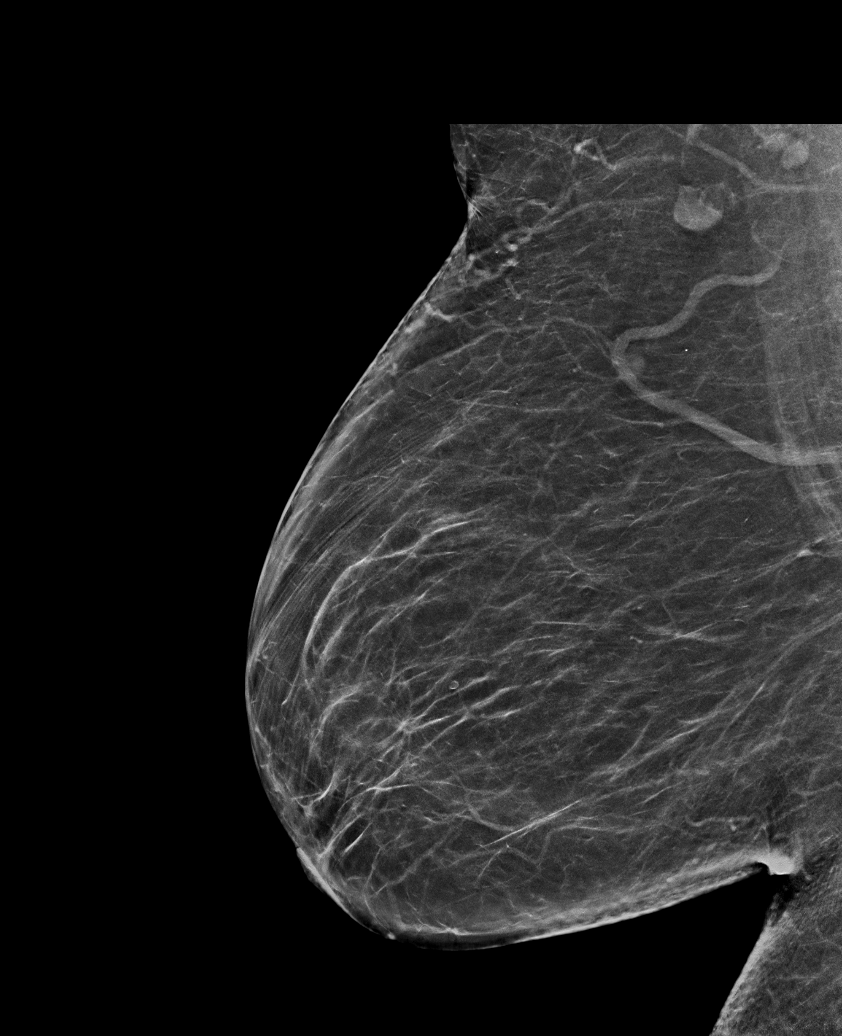

[L MLO synth-2D]
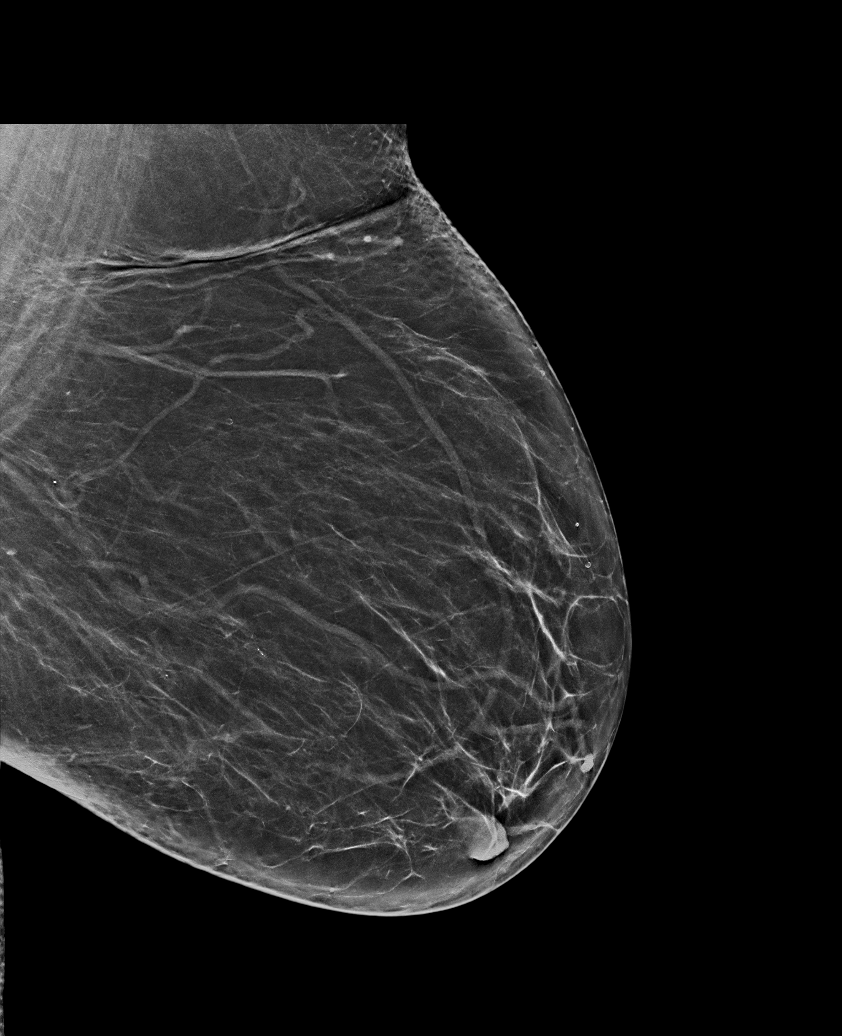

[R MLO tomo · tomo slice 37/73.0]
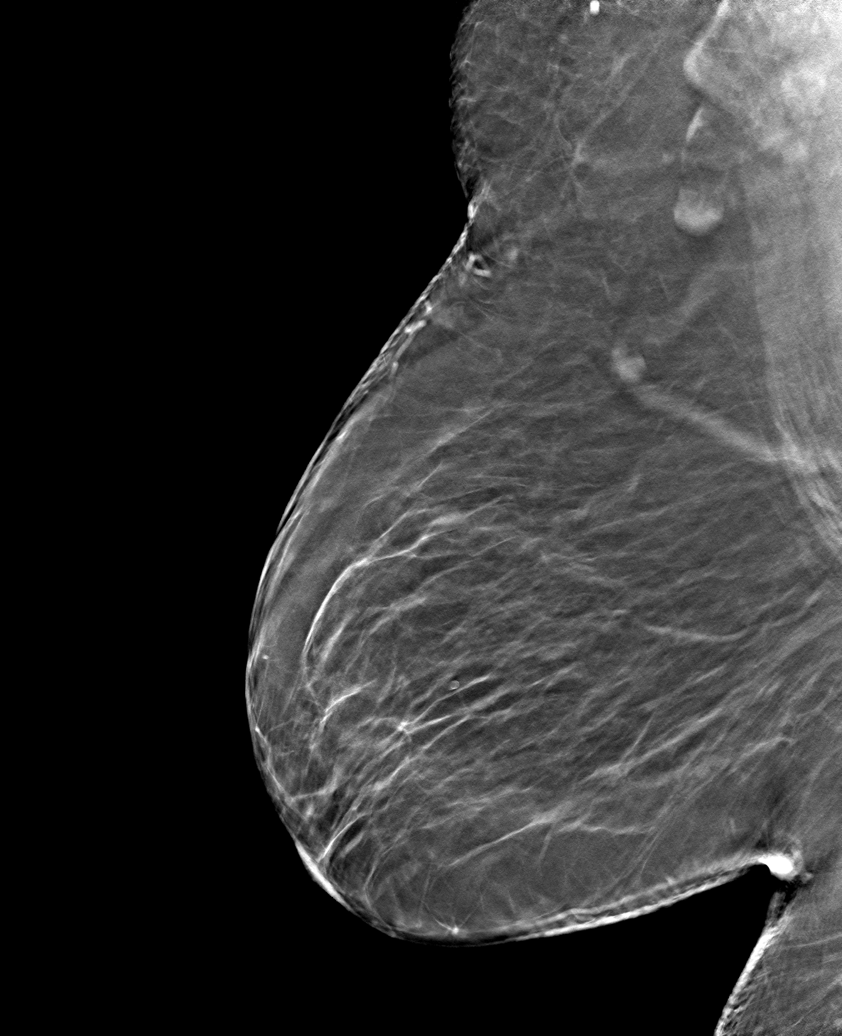

[6 of 30 positions shown; findings below may reference images not displayed]

FINDINGS: There are no findings suspicious for malignancy. Images were
processed with CAD.
IMPRESSION: No mammographic evidence of malignancy. A result letter of this
screening mammogram will be mailed directly to the patient.

RECOMMENDATION:
Screening mammogram in one year. (Code:[id number]-[id number])

BI-RADS CATEGORY  1: Negative.

## 2019-11-20 DIAGNOSIS — E1121 Type 2 diabetes mellitus with diabetic nephropathy: Secondary | ICD-10-CM | POA: Diagnosis not present

## 2019-11-20 DIAGNOSIS — E1165 Type 2 diabetes mellitus with hyperglycemia: Secondary | ICD-10-CM | POA: Diagnosis not present

## 2019-11-20 DIAGNOSIS — I10 Essential (primary) hypertension: Secondary | ICD-10-CM | POA: Diagnosis not present

## 2019-12-20 ENCOUNTER — Encounter: Payer: Self-pay | Admitting: Neurology

## 2019-12-20 ENCOUNTER — Ambulatory Visit (INDEPENDENT_AMBULATORY_CARE_PROVIDER_SITE_OTHER): Payer: Medicare Other | Admitting: Neurology

## 2019-12-20 ENCOUNTER — Other Ambulatory Visit: Payer: Self-pay

## 2019-12-20 VITALS — BP 110/78 | HR 120 | Temp 97.2°F | Ht 67.0 in | Wt 253.5 lb

## 2019-12-20 DIAGNOSIS — F321 Major depressive disorder, single episode, moderate: Secondary | ICD-10-CM

## 2019-12-20 DIAGNOSIS — G4733 Obstructive sleep apnea (adult) (pediatric): Secondary | ICD-10-CM

## 2019-12-20 DIAGNOSIS — R569 Unspecified convulsions: Secondary | ICD-10-CM

## 2019-12-20 DIAGNOSIS — R519 Headache, unspecified: Secondary | ICD-10-CM

## 2019-12-20 DIAGNOSIS — E1142 Type 2 diabetes mellitus with diabetic polyneuropathy: Secondary | ICD-10-CM | POA: Diagnosis not present

## 2019-12-20 DIAGNOSIS — M542 Cervicalgia: Secondary | ICD-10-CM | POA: Diagnosis not present

## 2019-12-20 DIAGNOSIS — E1140 Type 2 diabetes mellitus with diabetic neuropathy, unspecified: Secondary | ICD-10-CM | POA: Insufficient documentation

## 2019-12-20 DIAGNOSIS — M791 Myalgia, unspecified site: Secondary | ICD-10-CM

## 2019-12-20 MED ORDER — DULOXETINE HCL 60 MG PO CPEP: 60.0000 mg | ORAL_CAPSULE | Freq: Every day | ORAL | 11 refills | Status: DC

## 2019-12-20 NOTE — Progress Notes (Signed)
[facility]  PATIENT: [name] DOB: Aug 09, 1956  REFERRING DOCTOR OR PCP:  Shelva Majestic SOURCE: patient and records form EMR and Dr. [name]  _________________________________   HISTORICAL  CHIEF COMPLAINT:  Chief Complaint  Patient presents with  . Follow-up    RM 13, alone. Last seen 06/18/19. She is having more neck pain. Stays depressed all the time, having to help with her daughter kids (daughter's husband recently left her around Christmas). She is home a lot. Not able to do a lot for herself because she is looking out for everyone else. She is not seeing anyone for her depression.   . Seizures    Taking keppra, zonegran    HISTORY OF PRESENT ILLNESS:  [name] is a 64 year old woman with a h/o a generalized tonic-clonic seizure in 2016 and possible other seizures who also has neck pain,  headaches and obstructive sleep apnea.  Update 12/20/2019: She has not had any more seizures or other spells.    She thinks she tolerates the medications well.  She has left >> right neck pain.    Pain is less laying on her left side and worse when she holds head up.     Pain occasionally radiates into the left arm and with numbness in the same area.     In 2018, MRI cervical showed DJD at C6-C7 without spinal stenosis but there was no nerve root compression.   Splenius capitus TPI/GON injection did not help.  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  She notes much more depression.  She has a lot of family stress (takes care of 2 great grand-kids and a foster child and granddaughter lives with her).   She has never been on an antidepressant but has had depression a few times in the past.   Her mother had frequent depression.  Her granddaughter is bipolar.     She also has OSA and uses CPAP nightly.   She does not sleep well as her granddaughter works evenings until 1-2 am and she needs to get her 91 yo foster child to  school early in the morning.    Update 7/6/22020: On 06/07/19,  her family called 911 as she was not waking up well.    She had bradycardia in the ED.   CBC, CMP and Ammonia and drug screen were fine.   CT showed unchanged mild atrophy and white matter changes but no acute findings.   She has had only one GTC seizure in 2016 but has had some zoned out spells.   This event had no GTC or incontinence.   She is on Keppra and never misses a dose.  She has a chronic headache at the top of her head sometimes associated with numbness in her face.   No nausea but she has photophobia and phonophobia.   She has been on imipramine x 1 month   She has OSA but uses CPAP nightly.   She does not sleep well.    Update 05/24/2018: She is noting right sided occipital pain and headache.   Pain is better laying down with a pillow under her neck.    Pain is constant with superimposed needle-like sensation.    She denies N/V.  She has some photophobia and phonophobia.   Ibuprofen takes the edge off for a couple hours.   She had an MRI of the brain and MRI of the cervical spine that were read  as normal for age. Specifically the cervical MRI just shows a C6C7 bulge.     She has OSA and uses CPAP nightly.   Sometimes she takes it off if her grandson sleeps with her since he doesn't like her to wear it.  She will be getting permanent custody of her grandson.     She has not had any seizures since 2016.   She is on Keppra and tolerates it well.    Update 09/13/2017:   She has had a mil daily headache x one month or more but much more severe the last week.    HA is right sided and throbbing.  She has no N/V.   She has photophobia > phonophobia.    Moving the head worsens the pain when more intense.    Ibuprofen takes the edge off for a few hours and then HA returns more intensely.    She went to the ED 09/04/2017 with ingtense HA and chest pain.    NTG w as given which worsened the HA.  She was admitted x 1 day and was given  toradol, lorazepam, morphine and Zofran.  She had an MRI of the brain and MRI of the cervical spine that were read as normal for age. Specifically the cervical MRI just shows a C6C7 bulge.     She has only worn CPAP intermittently and we discussed trying to wear it the entire night.     She notes insomnia, sleep onset and sleep maintenance issues.   She laso has to move  She has not had any seizures since 2016   _____________________________________________________- From 05/19/2016 Seizure: She denies any seizures since the last visit.   In August 2015, she had generalized tonic-clonic seizure activity that was witnessed by her husband. She has had no prior seizures and none since.   She continues is on Keppra.  An MRI of the brain at that time was essentially normal. An EEG was performed and showed 2 runs of generalized spike wave activity, consistent with a generalized seizure disorder. She is unaware of any family history of seizures.    She has not had head trauma in the past. She had a normal childhood development. She has not had any meningitis or encephalitis.  Pseudotumor cerebri/headache and neck pain:   Due to headaches and papilledema, she underwent a LP.    CSF pressure was elevated At 26.5 cm..   She had a lot of bladder issues on Diamox and stopped.   Headaches are mostly occipital.  However, headaches never returned to the severity or frequency that they were before the lumbar puncture. The HA occurs 1 -2 times a week.  Ibuprofen often knocks the headache out, especially if she lays down.   Her blurry vision is better.    A recent eye exam reportedly just showed cataract.   Lightheadedness with standing up is also better.    She reports that her ophthalmologist told her that the optic nerve changes were due to cataracts.  Obstructive sleep apnea: About 4 or 5 years ago she was diagnosed with OSA with an overnight sleep study. She was started on CPAP +6 cm in the past.    However, she had  trouble tolerating the mask and stopped.   She is claustophobic so seldom made it through the night with CPAP.    She reports some fatigue and daytime sleepiness.     REVIEW OF SYSTEMS: Constitutional: No fevers, chills, sweats, or change in appetite.  She notes some fatigue and sleepiness. Eyes: cataracts.    Improved blurriness Ear, nose and throat: No hearing loss, ear pain, nasal congestion, sore throat Cardiovascular: No chest pain, palpitations Respiratory: No shortness of breath at rest or with exertion.   Some cough and wheezes GastrointestinaI: No nausea, vomiting, diarrhea, abdominal pain, fecal incontinence Genitourinary: No dysuria, urinary retention or frequency.  No nocturia. Musculoskeletal: No neck pain, back pain Integumentary: No rash, pruritus, skin lesions Neurological: as above Psychiatric: No depression at this time.  No anxiety.  Notes increased stress at times. Endocrine: No palpitations, diaphoresis, change in appetite, change in weigh or increased thirst Hematologic/Lymphatic: No anemia, purpura, petechiae. Allergic/Immunologic: No itchy/runny eyes, nasal congestion, recent allergic reactions, rashes  ALLERGIES: Allergies  Allergen Reactions  . Aspirin Nausea And Vomiting  . Codeine Rash  . Latex Rash    HOME MEDICATIONS:  Current Outpatient Medications:  .  atorvastatin (LIPITOR) 40 MG tablet, Take 40 mg by mouth at bedtime. , Disp: , Rfl:  .  clobetasol (TEMOVATE) 0.05 % external solution, Apply 1 application topically 2 (two) times daily as needed (to scalp for use at 1-2 weeks at one time). , Disp: , Rfl: 2 .  Fluocinolone Acetonide Scalp 0.01 % OIL, Apply 1 application topically daily as needed (applied to scalp for psoriasis). , Disp: , Rfl:  .  ibuprofen (ADVIL,MOTRIN) 200 MG tablet, Take 200-400 mg by mouth daily as needed for mild pain or moderate pain. , Disp: , Rfl:  .  levETIRAcetam (KEPPRA) 500 MG tablet, Take 1 tablet (500 mg total) by  mouth 2 (two) times daily., Disp: 60 tablet, Rfl: 5 .  levothyroxine (SYNTHROID, LEVOTHROID) 100 MCG tablet, Take 100 mcg by mouth every morning. , Disp: , Rfl:  .  metFORMIN (GLUCOPHAGE) 500 MG tablet, Take 500 mg by mouth 2 (two) times daily with a meal. , Disp: , Rfl:  .  metoprolol (LOPRESSOR) 50 MG tablet, Take 50 mg by mouth 2 (two) times daily.  , Disp: , Rfl:  .  nystatin cream (MYCOSTATIN), Apply 1 application topically 2 (two) times daily as needed for dry skin., Disp: 30 g, Rfl: 11 .  oxybutynin (DITROPAN) 5 MG tablet, TAKE 1 TABLET BY MOUTH TWICE DAILY FOR BLADDER. FXD DR FOR NEW SCRIPT. NOT THE SCRIPT., Disp: 60 tablet, Rfl: 11 .  triamcinolone cream (KENALOG) 0.1 %, Apply 1 application topically 2 (two) times daily as needed (for irritation)., Disp: 30 g, Rfl: 11 .  zonisamide (ZONEGRAN) 100 MG capsule, Take 1 capsule (100 mg total) by mouth daily., Disp: 30 capsule, Rfl: 11 .  albuterol (PROVENTIL HFA;VENTOLIN HFA) 108 (90 Base) MCG/ACT inhaler, Inhale 1-2 puffs into the lungs every 6 (six) hours as needed for wheezing or shortness of breath., Disp: , Rfl:  .  ALPRAZolam (XANAX) 0.5 MG tablet, Take 0.5 mg by mouth at bedtime. Prescribed twice daily but only take 1 tablet at bedtime, Disp: , Rfl:  .  DULoxetine (CYMBALTA) 60 MG capsule, Take 1 capsule (60 mg total) by mouth daily., Disp: 30 capsule, Rfl: 11  PAST MEDICAL HISTORY: Past Medical History:  Diagnosis Date  . Asthma   . Chronic back pain   . Chronic leg pain    Bilateral  . Chronic pelvic pain in female   . Essential hypertension   . Fibromyalgia   . H/O cardiac catheterization    No significant CAD (only 20% LAD) 03/02/13 with normal LV function.  Marland Kitchen Headache   . High cholesterol   .  Hypothyroidism   . Mental disorder   . OAB (overactive bladder) 05/21/2013  . Obstructive sleep apnea    Noncompliant with CPAP due to claustophobia.  . Panic attacks   . Rectocele 05/21/2013  . Seizures (HCC)    1 severe seizure  2015; has had "small ones" since including 9/18  . Type 2 diabetes mellitus (HCC)     PAST SURGICAL HISTORY: Past Surgical History:  Procedure Laterality Date  . ABDOMINAL HYSTERECTOMY    . ANKLE SURGERY    . BACK SURGERY    . CARPAL TUNNEL RELEASE    . knee replacements     bilateral  . LEFT HEART CATHETERIZATION WITH CORONARY ANGIOGRAM N/A 03/02/2013   Procedure: LEFT HEART CATHETERIZATION WITH CORONARY ANGIOGRAM;  Surgeon: Peter M Swaziland, MD;  Location: [facility] CATH LAB;  Service: Cardiovascular;  Laterality: N/A;  . TONSILLECTOMY    . TUBAL LIGATION      FAMILY HISTORY: Family History  Problem Relation Age of Onset  . Heart attack Mother   . Diabetes Mother   . Hypertension Mother   . Sick sinus syndrome Brother        Pacemaker  . Congenital heart disease Brother   . Stroke Brother   . Coronary artery disease Brother   . Breast cancer Maternal Aunt   . Cancer Cousin        leukemia    SOCIAL HISTORY:  Social History   Socioeconomic History  . Marital status: Married    Spouse name: Not on file  . Number of children: Not on file  . Years of education: Not on file  . Highest education level: Not on file  Occupational History  . Occupation: disabled  Tobacco Use  . Smoking status: Former Smoker    Packs/day: 1.00    Years: 2.00    Pack years: 2.00    Types: Cigarettes    Quit date: 12/13/1974    Years since quitting: 45.0  . Smokeless tobacco: Never Used  Substance and Sexual Activity  . Alcohol use: No  . Drug use: No  . Sexual activity: Not Currently    Birth control/protection: Surgical    Comment: hyst  Other Topics Concern  . Not on file  Social History Narrative  . Not on file   Social Determinants of Health   Financial Resource Strain:   . Difficulty of Paying Living Expenses: Not on file  Food Insecurity:   . Worried About Programme researcher, broadcasting/film/video in the Last Year: Not on file  . Ran Out of Food in the Last Year: Not on file  Transportation  Needs:   . Lack of Transportation (Medical): Not on file  . Lack of Transportation (Non-Medical): Not on file  Physical Activity:   . Days of Exercise per Week: Not on file  . Minutes of Exercise per Session: Not on file  Stress:   . Feeling of Stress : Not on file  Social Connections:   . Frequency of Communication with Friends and Family: Not on file  . Frequency of Social Gatherings with Friends and Family: Not on file  . Attends Religious Services: Not on file  . Active Member of Clubs or Organizations: Not on file  . Attends Banker Meetings: Not on file  . Marital Status: Not on file  Intimate Partner Violence:   . Fear of Current or Ex-Partner: Not on file  . Emotionally Abused: Not on file  . Physically Abused: Not on file  .  Sexually Abused: Not on file     PHYSICAL EXAM  Vitals:   12/20/19 0930  BP: 110/78  Pulse: (!) 120  Temp: (!) 97.2 F (36.2 C)  Weight: 253 lb 8 oz (115 kg)  Height: 5\' 7"  (1.702 m)    Body mass index is 39.7 kg/m.   General: The patient is an obese woman in no acute distress   Neck/Ext: The neck is supple, no carotid bruits are noted.  She has left > right occipital tenderness over splenius capitus muscle.   Also tender over left subacromial bursa.  Reduced ROM in neck and left shoulder.     Neurologic Exam  Mental status: The patient is alert and oriented x 3 at the time of the examination. The patient has apparent normal recent and remote memory, with an apparently normal attention span and concentration ability.   Speech is normal.  Cranial nerves: Extraocular movements are full.  Facial strength and sensation is normal. Trapezius strength is normal.    No obvious hearing deficits are noted.  Motor:  Muscle bulk is normal.   Tone is normal. Strength is  5 / 5 in all 4 extremities.   Sensory: Sensory testing is intact to touch and vibration in the hands.  She has reduced in her toes but normal at  ankles.  Coordination: Cerebellar testing reveals good finger-nose-finger and heel-to-shin bilaterally.  Gait and station: Station is normal.   Gait is and Tandem gait are mildly wide.. Romberg is negative.   Reflexes: Deep tendon reflexes are symmetric and normal bilaterally in arms and knees and absent ankle DTR.    \   DIAGNOSTIC DATA (LABS, IMAGING, TESTING) - I reviewed patient records, labs, notes, testing and imaging myself where available.  Lab Results  Component Value Date   WBC 7.9 09/06/2019   HGB 14.5 09/06/2019   HCT 47.1 (H) 09/06/2019   MCV 92.9 09/06/2019   PLT 210 09/06/2019      Component Value Date/Time   NA 139 09/06/2019 0027   K 4.4 09/06/2019 0027   CL 105 09/06/2019 0027   CO2 20 (L) 09/06/2019 0027   GLUCOSE 96 09/06/2019 0027   BUN 17 09/06/2019 0027   CREATININE 0.94 09/06/2019 0027   CALCIUM 9.6 09/06/2019 0027   PROT 6.8 06/07/2019 1452   ALBUMIN 3.9 06/07/2019 1452   AST 23 06/07/2019 1452   ALT 20 06/07/2019 1452   ALKPHOS 74 06/07/2019 1452   BILITOT 0.9 06/07/2019 1452   GFRNONAA >60 09/06/2019 0027   GFRAA >60 09/06/2019 0027   Lab Results  Component Value Date   CHOL 119 09/05/2017   HDL 40 (L) 09/05/2017   LDLCALC 65 09/05/2017   TRIG 69 09/05/2017   CHOLHDL 3.0 09/05/2017   Lab Results  Component Value Date   HGBA1C 5.3 09/04/2017      ASSESSMENT AND PLAN   1. Seizure ([place])   2. Headache, occipital   3. Neck pain   4. Obstructive sleep apnea   5. Diabetic polyneuropathy associated with type 2 diabetes mellitus ([place])   6. Current moderate episode of major depressive disorder without prior episode ([place])   7. Myalgia     1.   Continue Keppra 500 mg p.o. twice daily and zonisamide 100 mg  2.   Cymbalta 60 mg for mood and pain.   If not better she should see psychiatry/mental health.    3    Try to wear CPAP nightly.  4.  She prefers not to try another TPI or try left subAC bursa injection.   5.   She will return  to see me in 6 months or sooner if there are new or worsening neurologic symptoms.  40 minutes including face to face interaction counseling and coordinating care regarding depression, pain and seizure.      Jeris Roser A. Epimenio Foot, MD, PhD 12/20/2019, 10:06 AM Certified in Neurology, Clinical Neurophysiology, Sleep Medicine, Pain Medicine and Neuroimaging  [facility] [address] [place], [place] [address] [phone]

## 2020-01-02 DIAGNOSIS — E1121 Type 2 diabetes mellitus with diabetic nephropathy: Secondary | ICD-10-CM | POA: Diagnosis not present

## 2020-01-02 DIAGNOSIS — I10 Essential (primary) hypertension: Secondary | ICD-10-CM | POA: Diagnosis not present

## 2020-01-02 DIAGNOSIS — E1165 Type 2 diabetes mellitus with hyperglycemia: Secondary | ICD-10-CM | POA: Diagnosis not present

## 2020-01-14 DIAGNOSIS — I10 Essential (primary) hypertension: Secondary | ICD-10-CM | POA: Diagnosis not present

## 2020-01-14 DIAGNOSIS — E1121 Type 2 diabetes mellitus with diabetic nephropathy: Secondary | ICD-10-CM | POA: Diagnosis not present

## 2020-01-14 DIAGNOSIS — E1165 Type 2 diabetes mellitus with hyperglycemia: Secondary | ICD-10-CM | POA: Diagnosis not present

## 2020-01-30 DIAGNOSIS — J069 Acute upper respiratory infection, unspecified: Secondary | ICD-10-CM | POA: Diagnosis not present

## 2020-02-02 ENCOUNTER — Encounter (HOSPITAL_COMMUNITY): Payer: Self-pay | Admitting: Emergency Medicine

## 2020-02-02 ENCOUNTER — Emergency Department (HOSPITAL_COMMUNITY)
Admission: EM | Admit: 2020-02-02 | Discharge: 2020-02-02 | Disposition: A | Discharge status: Home / Self Care | Payer: Medicare Other | Attending: Emergency Medicine | Admitting: Emergency Medicine

## 2020-02-02 ENCOUNTER — Other Ambulatory Visit: Payer: Self-pay

## 2020-02-02 DIAGNOSIS — R438 Other disturbances of smell and taste: Secondary | ICD-10-CM | POA: Diagnosis not present

## 2020-02-02 DIAGNOSIS — M79602 Pain in left arm: Secondary | ICD-10-CM | POA: Diagnosis not present

## 2020-02-02 DIAGNOSIS — E119 Type 2 diabetes mellitus without complications: Secondary | ICD-10-CM | POA: Diagnosis not present

## 2020-02-02 DIAGNOSIS — Z79899 Other long term (current) drug therapy: Secondary | ICD-10-CM | POA: Insufficient documentation

## 2020-02-02 DIAGNOSIS — Z87891 Personal history of nicotine dependence: Secondary | ICD-10-CM | POA: Insufficient documentation

## 2020-02-02 DIAGNOSIS — E039 Hypothyroidism, unspecified: Secondary | ICD-10-CM | POA: Insufficient documentation

## 2020-02-02 DIAGNOSIS — I10 Essential (primary) hypertension: Secondary | ICD-10-CM | POA: Diagnosis not present

## 2020-02-02 DIAGNOSIS — R519 Headache, unspecified: Secondary | ICD-10-CM | POA: Insufficient documentation

## 2020-02-02 DIAGNOSIS — Z91040 Latex allergy status: Secondary | ICD-10-CM | POA: Diagnosis not present

## 2020-02-02 MED ORDER — FENTANYL CITRATE (PF) 100 MCG/2ML IJ SOLN
50.0000 ug | Freq: Once | INTRAMUSCULAR | Status: AC
  Administered 2020-02-02: 18:00:00 50 ug via INTRAVENOUS
  Filled 2020-02-02: qty 2

## 2020-02-02 MED ORDER — SODIUM CHLORIDE 0.9 % IV BOLUS
500.0000 mL | Freq: Once | INTRAVENOUS | Status: AC
  Administered 2020-02-02: 18:00:00 500 mL via INTRAVENOUS

## 2020-02-02 MED ORDER — DIPHENHYDRAMINE HCL 50 MG/ML IJ SOLN
12.5000 mg | Freq: Once | INTRAMUSCULAR | Status: AC
  Administered 2020-02-02: 18:00:00 12.5 mg via INTRAVENOUS
  Filled 2020-02-02: qty 1

## 2020-02-02 MED ORDER — METOCLOPRAMIDE HCL 5 MG/ML IJ SOLN
10.0000 mg | Freq: Once | INTRAMUSCULAR | Status: AC
  Administered 2020-02-02: 18:00:00 10 mg via INTRAVENOUS
  Filled 2020-02-02: qty 2

## 2020-02-02 NOTE — ED Provider Notes (Signed)
[facility] EMERGENCY DEPARTMENT Provider Note   CSN: [id number] Arrival date & time: 02/02/20  1612     History Chief Complaint  Patient presents with  . Headache    [name] is a 64 y.o. female.  Generalized headache for several days with radiation to the left arm with associated loss of taste in her tongue.  Review of systems positive for light and sound sensitivity with nausea, vomiting, cough, headache.  Recent negative Covid test.  Past medical history reviewed.        Past Medical History:  Diagnosis Date  . Asthma   . Chronic back pain   . Chronic leg pain    Bilateral  . Chronic pelvic pain in female   . Essential hypertension   . Fibromyalgia   . H/O cardiac catheterization    No significant CAD (only 20% LAD) 03/02/13 with normal LV function.  Marland Kitchen Headache   . High cholesterol   . Hypothyroidism   . Mental disorder   . OAB (overactive bladder) 05/21/2013  . Obstructive sleep apnea    Noncompliant with CPAP due to claustophobia.  . Panic attacks   . Rectocele 05/21/2013  . Seizures (HCC)    1 severe seizure 2015; has had "small ones" since including 9/18  . Type 2 diabetes mellitus [facility])     Patient Active Problem List   Diagnosis Date Noted  . Diabetic neuropathy (HCC) 12/20/2019  . Current moderate episode of major depressive disorder without prior episode (HCC) 12/20/2019  . Myalgia 12/20/2019  . SUI (stress urinary incontinence, female) 05/05/2018  . Screening for colorectal cancer 05/05/2018  . Encounter for well woman exam with routine gynecological exam 05/05/2018  . Breast pain, right 05/05/2018  . Insomnia 09/13/2017  . Restless leg 09/13/2017  . Superficial fungus infection of skin 05/04/2017  . Bruising 10/14/2016  . Chest pain 10/04/2016  . Hypothyroidism 10/04/2016  . Idiopathic intracranial hypertension 11/19/2015  . Papilledema 09/08/2015  . Left facial numbness 08/22/2015  . Epilepsy, generalized, convulsive (HCC) 06/05/2015   . Obstructive sleep apnea 06/05/2015  . Headache, occipital 06/05/2015  . Neck pain 06/05/2015  . Occipital neuralgia of right side 06/05/2015  . Seizure (HCC) 07/21/2014  . Rectocele 05/21/2013  . OAB (overactive bladder) 05/21/2013  . Difficulty in walking(719.7) 04/11/2013  . Leg weakness, bilateral 04/11/2013  . Diabetes (HCC) 02/28/2013  . Morbid obesity (HCC) 02/28/2013  . Hyperlipidemia 02/28/2013  . CHRONIC OSTEOMYELITIS ANKLE AND FOOT 07/22/2009  . Hypertension 07/22/2009    Past Surgical History:  Procedure Laterality Date  . ABDOMINAL HYSTERECTOMY    . ANKLE SURGERY    . BACK SURGERY    . CARPAL TUNNEL RELEASE    . knee replacements     bilateral  . LEFT HEART CATHETERIZATION WITH CORONARY ANGIOGRAM N/A 03/02/2013   Procedure: LEFT HEART CATHETERIZATION WITH CORONARY ANGIOGRAM;  Surgeon: Peter M Swaziland, MD;  Location: [facility] CATH LAB;  Service: Cardiovascular;  Laterality: N/A;  . TONSILLECTOMY    . TUBAL LIGATION       OB History    Gravida  2   Para  2   Term  2   Preterm      AB      Living  2     SAB      TAB      Ectopic      Multiple      Live Births  2  Family History  Problem Relation Age of Onset  . Heart attack Mother   . Diabetes Mother   . Hypertension Mother   . Sick sinus syndrome Brother        Pacemaker  . Congenital heart disease Brother   . Stroke Brother   . Coronary artery disease Brother   . Breast cancer Maternal Aunt   . Cancer Cousin        leukemia    Social History   Tobacco Use  . Smoking status: Former Smoker    Packs/day: 1.00    Years: 2.00    Pack years: 2.00    Types: Cigarettes    Quit date: 12/13/1974    Years since quitting: 45.1  . Smokeless tobacco: Never Used  Substance Use Topics  . Alcohol use: No  . Drug use: No    Home Medications Prior to Admission medications   Medication Sig Start Date End Date Taking? Authorizing Provider  albuterol (PROVENTIL HFA;VENTOLIN HFA) 108  (90 Base) MCG/ACT inhaler Inhale 1-2 puffs into the lungs every 6 (six) hours as needed for wheezing or shortness of breath.    [provider]  ALPRAZolam Duanne Moron) 0.5 MG tablet Take 0.5 mg by mouth at bedtime. Prescribed twice daily but only take 1 tablet at bedtime 04/17/19   [provider]  atorvastatin (LIPITOR) 40 MG tablet Take 40 mg by mouth at bedtime.  10/15/14   [provider]  clobetasol (TEMOVATE) 0.05 % external solution Apply 1 application topically 2 (two) times daily as needed (to scalp for use at 1-2 weeks at one time).  04/04/18   [provider]  DULoxetine (CYMBALTA) 60 MG capsule Take 1 capsule (60 mg total) by mouth daily. 12/20/19   Sater, Nanine Means, MD  Fluocinolone Acetonide Scalp 0.01 % OIL Apply 1 application topically daily as needed (applied to scalp for psoriasis).     [provider]  ibuprofen (ADVIL,MOTRIN) 200 MG tablet Take 200-400 mg by mouth daily as needed for mild pain or moderate pain.     [provider]  levETIRAcetam (KEPPRA) 500 MG tablet Take 1 tablet (500 mg total) by mouth 2 (two) times daily. 08/21/19   Sater, Nanine Means, MD  levothyroxine (SYNTHROID, LEVOTHROID) 100 MCG tablet Take 100 mcg by mouth every morning.  12/31/16   [provider]  metFORMIN (GLUCOPHAGE) 500 MG tablet Take 500 mg by mouth 2 (two) times daily with a meal.  03/26/14   [provider]  metoprolol (LOPRESSOR) 50 MG tablet Take 50 mg by mouth 2 (two) times daily.      [provider]  nystatin cream (MYCOSTATIN) Apply 1 application topically 2 (two) times daily as needed for dry skin. 10/07/19   Florian Buff, MD  oxybutynin (DITROPAN) 5 MG tablet TAKE 1 TABLET BY MOUTH TWICE DAILY FOR BLADDER. FXD DR FOR NEW SCRIPT. NOT THE SCRIPT. 08/24/19   Florian Buff, MD  triamcinolone cream (KENALOG) 0.1 % Apply 1 application topically 2 (two) times daily as needed (for irritation). 10/07/19   Florian Buff, MD   zonisamide (ZONEGRAN) 100 MG capsule Take 1 capsule (100 mg total) by mouth daily. 08/21/19   Sater, Nanine Means, MD    Allergies    Aspirin, Codeine, and Latex  Review of Systems   Review of Systems  All other systems reviewed and are negative.   Physical Exam Updated Vital Signs BP 99/79 (BP Location: Right Arm)   Pulse (!) 56  Temp 98.7 F (37.1 C) (Oral)   Resp 18   Ht 5\' 4"  (1.626 m)   Wt 114.3 kg   SpO2 97%   BMI 43.26 kg/m   Physical Exam Vitals and nursing note reviewed.  Constitutional:      Appearance: She is well-developed.     Comments: Conversant, no obvious neurological deficits.  HENT:     Head: Normocephalic and atraumatic.  Eyes:     Conjunctiva/sclera: Conjunctivae normal.  Cardiovascular:     Rate and Rhythm: Normal rate and regular rhythm.  Pulmonary:     Effort: Pulmonary effort is normal.     Breath sounds: Normal breath sounds.  Abdominal:     General: Bowel sounds are normal.     Palpations: Abdomen is soft.  Musculoskeletal:        General: Normal range of motion.     Cervical back: Neck supple.  Skin:    General: Skin is warm and dry.  Neurological:     General: No focal deficit present.     Mental Status: She is alert and oriented to person, place, and time.     Comments: Vision grossly normal, full range of motion of tongue; motor, sensory, cerebellar exam acceptable.  Psychiatric:        Behavior: Behavior normal.     ED Results / Procedures / Treatments   Labs (all labs ordered are listed, but only abnormal results are displayed) Labs Reviewed - No data to display  EKG None  Radiology No results found.  Procedures Procedures (including critical care time)  Medications Ordered in ED Medications  sodium chloride 0.9 % bolus 500 mL (500 mLs Intravenous New Bag/Given 02/02/20 1817)  metoCLOPramide (REGLAN) injection 10 mg (10 mg Intravenous Given 02/02/20 1816)  diphenhydrAMINE (BENADRYL) injection 12.5 mg (12.5 mg  Intravenous Given 02/02/20 1816)  fentaNYL (SUBLIMAZE) injection 50 mcg (50 mcg Intravenous Given 02/02/20 1816)    ED Course  I have reviewed the triage vital signs and the nursing notes.  Pertinent labs & imaging results that were available during my care of the patient were reviewed by me and considered in my medical decision making (see chart for details).    MDM Rules/Calculators/A&P                      Patient has normal neuro exam.  Rx IV fluids, IV fentanyl, IV Reglan, IV Benadryl.  2020: Patient rechecked prior to discharge.  Feeling much better.  Decreased headache.  No neuro deficits. Final Clinical Impression(s) / ED Diagnoses Final diagnoses:  Intractable headache, unspecified chronicity pattern, unspecified headache type    Rx / DC Orders ED Discharge Orders    None       02/04/20, MD 02/02/20 2039

## 2020-02-02 NOTE — ED Triage Notes (Signed)
Patient c/o headache x5 days that is progressively getting worse. Per patient sensitivity to light and sound with nausea and vomiting. Patient called PCP had diarrhea and cough with headache. Patient had negative covid test and was told to take imodium, ibuprofen, robitussin DM. Per patient other symptoms have cleared but today see started having neck pain, left arm pain, and mouth numbness. Patient states hx of seizures, MI, and stroke.

## 2020-02-02 NOTE — Discharge Instructions (Addendum)
Follow-up with your primary care doctor or return if worse. °

## 2020-02-05 ENCOUNTER — Telehealth: Payer: Self-pay | Admitting: Neurology

## 2020-02-05 DIAGNOSIS — R200 Anesthesia of skin: Secondary | ICD-10-CM

## 2020-02-05 DIAGNOSIS — Z87898 Personal history of other specified conditions: Secondary | ICD-10-CM

## 2020-02-05 NOTE — Telephone Encounter (Signed)
Tried calling pt back, went to VM. LVM for her to call office

## 2020-02-05 NOTE — Telephone Encounter (Signed)
Pt called stating that her R side of her face and in the mouth are numb due to a really bad headache she is having and she would like to discuss with RN what is advised to do since the medications are not working for her. Please advise.

## 2020-02-05 NOTE — Telephone Encounter (Signed)
Called pt back. She went to ER 02/02/20 and received IV cocktail for headache. It helped decrease severity of HA but it did not resolve. She is still having numbness on R side of face. They did not do any imaging. In middle of call, her line went silent. I tried calling back but it went to VM.

## 2020-02-07 ENCOUNTER — Other Ambulatory Visit: Payer: Self-pay | Admitting: *Deleted

## 2020-02-07 MED ORDER — ALPRAZOLAM 0.5 MG PO TABS: ORAL_TABLET | ORAL | 0 refills | Status: DC

## 2020-02-07 NOTE — Telephone Encounter (Signed)
Patient called back in regards to missed call states she can also be reached at the following number  [phone]

## 2020-02-07 NOTE — Telephone Encounter (Signed)
I called pt back. Relayed Dr. Bonnita Hollow recommendations. She is agreeable to MRI. Placed order.  She is claustorphoic and requesting something to take prior to MRI. MD approved rx xanax. Sent to MD to e-scribe to [facility].

## 2020-02-07 NOTE — Telephone Encounter (Signed)
Called pt back at [phone]. She is feeling worse, unable to focus. She is still having facial numbness. Cannot wake up, very fatigued. Has not been in contact with anyone with covid-19. Tested last week and negative. Verified she is still taking duloxetine 60mg  po qd, levetiracetam 500mg  po BID and zonisamide 100mg  po qd. Advised I will discuss with MD and call her back with recommendation.  Best call back number [phone].

## 2020-02-07 NOTE — Addendum Note (Signed)
Addended byYetta Barre, Clement Deneault L on: 02/07/2020 05:00 PM   Modules accepted: Orders

## 2020-02-07 NOTE — Telephone Encounter (Signed)
I don't know what is causing her symptoms.    Lets set up MRI brain with/without "facial numbness and h/o seizures"

## 2020-02-11 ENCOUNTER — Telehealth: Payer: Self-pay | Admitting: Neurology

## 2020-02-11 NOTE — Telephone Encounter (Signed)
Patient is scheduled at [facility] for 02/18/20.

## 2020-02-11 NOTE — Telephone Encounter (Signed)
[employer] medicare/medicaid order faxed to [facility]. they will reach out to the patient to schedule.

## 2020-02-13 DIAGNOSIS — I10 Essential (primary) hypertension: Secondary | ICD-10-CM | POA: Diagnosis not present

## 2020-02-13 DIAGNOSIS — R945 Abnormal results of liver function studies: Secondary | ICD-10-CM | POA: Diagnosis not present

## 2020-02-13 DIAGNOSIS — E782 Mixed hyperlipidemia: Secondary | ICD-10-CM | POA: Diagnosis not present

## 2020-02-13 DIAGNOSIS — E1140 Type 2 diabetes mellitus with diabetic neuropathy, unspecified: Secondary | ICD-10-CM | POA: Diagnosis not present

## 2020-02-18 DIAGNOSIS — R200 Anesthesia of skin: Secondary | ICD-10-CM | POA: Diagnosis not present

## 2020-02-22 ENCOUNTER — Telehealth: Payer: Self-pay | Admitting: Neurology

## 2020-02-22 NOTE — Telephone Encounter (Signed)
Pt is asking for a call with results to MRI as soon as results are available

## 2020-02-24 ENCOUNTER — Telehealth: Payer: Self-pay | Admitting: Neurology

## 2020-02-24 NOTE — Telephone Encounter (Signed)
Please let her know that the MRI of the brain was normal for her age.

## 2020-02-25 MED ORDER — ZONISAMIDE 100 MG PO CAPS: 200.0000 mg | ORAL_CAPSULE | Freq: Every day | ORAL | 5 refills | Status: DC

## 2020-02-25 NOTE — Telephone Encounter (Signed)
Zonisamide sometimes helps chronic headache.  Lets have her increase the dose from 1 pill to 2 pills nightly.  We can send in a prescription for #60 with 5 refills

## 2020-02-25 NOTE — Telephone Encounter (Signed)
Called and spoke with pt about MRI results per Dr. Epimenio Foot note. She verbalized understanding. She states her head still hurts. Mostly at night. She is taking medication as prescribed. Denies any active infections. She wants to know what Dr. Epimenio Foot would recommend at this point. Advised I will send him message and call back once I hear back from him

## 2020-02-25 NOTE — Telephone Encounter (Signed)
Per Dr. Epimenio Foot: "Please let her know that the MRI of the brain was normal for her age."

## 2020-02-25 NOTE — Telephone Encounter (Signed)
Called and spoke with pt. Relayed Dr. Bonnita Hollow recommendation. She is agreeable to try this. Escribed new rx to [facility] per pt request. Advised it can take a couple weeks to build up in her system to reach max benefit. She should take daily as prescribed. She will call back if any further questions or concerns.

## 2020-02-25 NOTE — Addendum Note (Signed)
Addended by: Arther Abbott on: 02/25/2020 05:42 PM   Modules accepted: Orders

## 2020-03-18 DIAGNOSIS — E1140 Type 2 diabetes mellitus with diabetic neuropathy, unspecified: Secondary | ICD-10-CM | POA: Diagnosis not present

## 2020-03-18 DIAGNOSIS — I10 Essential (primary) hypertension: Secondary | ICD-10-CM | POA: Diagnosis not present

## 2020-03-18 DIAGNOSIS — E782 Mixed hyperlipidemia: Secondary | ICD-10-CM | POA: Diagnosis not present

## 2020-03-18 DIAGNOSIS — K760 Fatty (change of) liver, not elsewhere classified: Secondary | ICD-10-CM | POA: Diagnosis not present

## 2020-03-24 ENCOUNTER — Emergency Department (HOSPITAL_COMMUNITY): Payer: Medicare Other

## 2020-03-24 ENCOUNTER — Encounter (HOSPITAL_COMMUNITY): Payer: Self-pay | Admitting: Emergency Medicine

## 2020-03-24 ENCOUNTER — Emergency Department (HOSPITAL_COMMUNITY)
Admission: EM | Admit: 2020-03-24 | Discharge: 2020-03-24 | Disposition: A | Discharge status: Home / Self Care | Payer: Medicare Other | Attending: Emergency Medicine | Admitting: Emergency Medicine

## 2020-03-24 ENCOUNTER — Other Ambulatory Visit: Payer: Self-pay

## 2020-03-24 DIAGNOSIS — S8991XA Unspecified injury of right lower leg, initial encounter: Secondary | ICD-10-CM | POA: Diagnosis present

## 2020-03-24 DIAGNOSIS — Z79899 Other long term (current) drug therapy: Secondary | ICD-10-CM | POA: Insufficient documentation

## 2020-03-24 DIAGNOSIS — E119 Type 2 diabetes mellitus without complications: Secondary | ICD-10-CM | POA: Diagnosis not present

## 2020-03-24 DIAGNOSIS — S9001XA Contusion of right ankle, initial encounter: Secondary | ICD-10-CM | POA: Insufficient documentation

## 2020-03-24 DIAGNOSIS — W208XXA Other cause of strike by thrown, projected or falling object, initial encounter: Secondary | ICD-10-CM | POA: Diagnosis not present

## 2020-03-24 DIAGNOSIS — S8011XA Contusion of right lower leg, initial encounter: Secondary | ICD-10-CM | POA: Insufficient documentation

## 2020-03-24 DIAGNOSIS — Y939 Activity, unspecified: Secondary | ICD-10-CM | POA: Diagnosis not present

## 2020-03-24 DIAGNOSIS — Z7984 Long term (current) use of oral hypoglycemic drugs: Secondary | ICD-10-CM | POA: Insufficient documentation

## 2020-03-24 DIAGNOSIS — Z87891 Personal history of nicotine dependence: Secondary | ICD-10-CM | POA: Diagnosis not present

## 2020-03-24 DIAGNOSIS — E039 Hypothyroidism, unspecified: Secondary | ICD-10-CM | POA: Insufficient documentation

## 2020-03-24 DIAGNOSIS — Y999 Unspecified external cause status: Secondary | ICD-10-CM | POA: Insufficient documentation

## 2020-03-24 DIAGNOSIS — M7989 Other specified soft tissue disorders: Secondary | ICD-10-CM | POA: Diagnosis not present

## 2020-03-24 DIAGNOSIS — S99911A Unspecified injury of right ankle, initial encounter: Secondary | ICD-10-CM | POA: Diagnosis not present

## 2020-03-24 DIAGNOSIS — Y929 Unspecified place or not applicable: Secondary | ICD-10-CM | POA: Insufficient documentation

## 2020-03-24 NOTE — Discharge Instructions (Addendum)
Elevate and apply ice packs on/off to your lower leg and ankle.  Wear the brace as needed for support.  You may remove at bedtime and for bathing.  Follow-up with your primary provider for recheck in one week if needed

## 2020-03-24 NOTE — ED Triage Notes (Signed)
Pt states she dropped the top of a cement bird bath on her right leg. Pt has abrasion noted to the side of right leg.

## 2020-03-25 NOTE — ED Provider Notes (Signed)
[facility] EMERGENCY DEPARTMENT Provider Note   CSN: [id number] Arrival date & time: 03/24/20  1856     History Chief Complaint  Patient presents with  . Leg Injury    [name] is a 64 y.o. female.  HPI      [name] is a 65 y.o. female who presents to the Emergency Department complaining of right lower leg and ankle pain secondary to a direct blow that occurred one day prior to arrival.  [place] that she dropped a cement bird bath that fell against her right leg.  She describes a throbbing pain to her lower leg near her ankle that worsens with weight bearing and improves at rest.  She also reports having an abrasion to her lower leg.  She denies fall, numbness or weakness of her foot or leg, swelling, calf or knee pain.  Last Td is up to date.    Past Medical History:  Diagnosis Date  . Asthma   . Chronic back pain   . Chronic leg pain    Bilateral  . Chronic pelvic pain in female   . Essential hypertension   . Fibromyalgia   . H/O cardiac catheterization    No significant CAD (only 20% LAD) 03/02/13 with normal LV function.  Marland Kitchen Headache   . High cholesterol   . Hypothyroidism   . Mental disorder   . OAB (overactive bladder) 05/21/2013  . Obstructive sleep apnea    Noncompliant with CPAP due to claustophobia.  . Panic attacks   . Rectocele 05/21/2013  . Seizures ([place])    1 severe seizure 2015; has had "small ones" since including 9/18  . Type 2 diabetes mellitus [facility])     Patient Active Problem List   Diagnosis Date Noted  . Diabetic neuropathy ([place]) 12/20/2019  . Current moderate episode of major depressive disorder without prior episode ([place]) 12/20/2019  . Myalgia 12/20/2019  . SUI (stress urinary incontinence, female) 05/05/2018  . Screening for colorectal cancer 05/05/2018  . Encounter for well woman exam with routine gynecological exam 05/05/2018  . Breast pain, right 05/05/2018  . Insomnia 09/13/2017  . Restless leg 09/13/2017  . Superficial  fungus infection of skin 05/04/2017  . Bruising 10/14/2016  . Chest pain 10/04/2016  . Hypothyroidism 10/04/2016  . Idiopathic intracranial hypertension 11/19/2015  . Papilledema 09/08/2015  . Left facial numbness 08/22/2015  . Epilepsy, generalized, convulsive ([place]) 06/05/2015  . Obstructive sleep apnea 06/05/2015  . Headache, occipital 06/05/2015  . Neck pain 06/05/2015  . Occipital neuralgia of right side 06/05/2015  . Seizure ([place]) 07/21/2014  . Rectocele 05/21/2013  . OAB (overactive bladder) 05/21/2013  . Difficulty in walking(719.7) 04/11/2013  . Leg weakness, bilateral 04/11/2013  . Diabetes ([place]) 02/28/2013  . Morbid obesity ([place]) 02/28/2013  . Hyperlipidemia 02/28/2013  . CHRONIC OSTEOMYELITIS ANKLE AND FOOT 07/22/2009  . Hypertension 07/22/2009    Past Surgical History:  Procedure Laterality Date  . ABDOMINAL HYSTERECTOMY    . ANKLE SURGERY    . BACK SURGERY    . CARPAL TUNNEL RELEASE    . knee replacements     bilateral  . LEFT HEART CATHETERIZATION WITH CORONARY ANGIOGRAM N/A 03/02/2013   Procedure: LEFT HEART CATHETERIZATION WITH CORONARY ANGIOGRAM;  Surgeon: Peter M Martinique, MD;  Location: [facility] CATH LAB;  Service: Cardiovascular;  Laterality: N/A;  . TONSILLECTOMY    . TUBAL LIGATION       OB History    Gravida  2   Para  2   Term  2   Preterm      AB      Living  2     SAB      TAB      Ectopic      Multiple      Live Births  2           Family History  Problem Relation Age of Onset  . Heart attack Mother   . Diabetes Mother   . Hypertension Mother   . Sick sinus syndrome Brother        Pacemaker  . Congenital heart disease Brother   . Stroke Brother   . Coronary artery disease Brother   . Breast cancer Maternal Aunt   . Cancer Cousin        leukemia    Social History   Tobacco Use  . Smoking status: Former Smoker    Packs/day: 1.00    Years: 2.00    Pack years: 2.00    Types: Cigarettes    Quit date: 12/13/1974     Years since quitting: 45.3  . Smokeless tobacco: Never Used  Substance Use Topics  . Alcohol use: No  . Drug use: No    Home Medications Prior to Admission medications   Medication Sig Start Date End Date Taking? Authorizing Provider  albuterol (PROVENTIL HFA;VENTOLIN HFA) 108 (90 Base) MCG/ACT inhaler Inhale 1-2 puffs into the lungs every 6 (six) hours as needed for wheezing or shortness of breath.    [provider]  ALPRAZolam Prudy Feeler) 0.5 MG tablet Take 1-2 tablets 30-60 min prior to MRI. Can take additional tablet at time of time if needed. Must have driver, can cause drowsiness. 02/07/20   Sater, Pearletha Furl, MD  atorvastatin (LIPITOR) 40 MG tablet Take 40 mg by mouth at bedtime.  10/15/14   [provider]  clobetasol (TEMOVATE) 0.05 % external solution Apply 1 application topically 2 (two) times daily as needed (to scalp for use at 1-2 weeks at one time).  04/04/18   [provider]  DULoxetine (CYMBALTA) 60 MG capsule Take 1 capsule (60 mg total) by mouth daily. 12/20/19   Sater, Pearletha Furl, MD  Fluocinolone Acetonide Scalp 0.01 % OIL Apply 1 application topically daily as needed (applied to scalp for psoriasis).     [provider]  ibuprofen (ADVIL,MOTRIN) 200 MG tablet Take 200-400 mg by mouth daily as needed for mild pain or moderate pain.     [provider]  levETIRAcetam (KEPPRA) 500 MG tablet Take 1 tablet (500 mg total) by mouth 2 (two) times daily. 08/21/19   Sater, Pearletha Furl, MD  levothyroxine (SYNTHROID, LEVOTHROID) 100 MCG tablet Take 100 mcg by mouth every morning.  12/31/16   [provider]  metFORMIN (GLUCOPHAGE) 500 MG tablet Take 500 mg by mouth 2 (two) times daily with a meal.  03/26/14   [provider]  metoprolol (LOPRESSOR) 50 MG tablet Take 50 mg by mouth 2 (two) times daily.      [provider]  nystatin cream (MYCOSTATIN) Apply 1 application topically 2 (two) times daily as needed for dry skin.  10/07/19   Lazaro Arms, MD  oxybutynin (DITROPAN) 5 MG tablet TAKE 1 TABLET BY MOUTH TWICE DAILY FOR BLADDER. FXD DR FOR NEW SCRIPT. NOT THE SCRIPT. 08/24/19   Lazaro Arms, MD  triamcinolone cream (KENALOG) 0.1 % Apply 1 application topically 2 (two) times daily as needed (for irritation). 10/07/19  Lazaro Arms, MD  zonisamide (ZONEGRAN) 100 MG capsule Take 2 capsules (200 mg total) by mouth at bedtime. 02/25/20   Sater, Pearletha Furl, MD    Allergies    Aspirin, Codeine, and Latex  Review of Systems   Review of Systems  Constitutional: Negative for chills and fever.  Respiratory: Negative for shortness of breath.   Cardiovascular: Negative for chest pain.  Musculoskeletal: Positive for arthralgias (right lower leg and ankle pain). Negative for back pain and joint swelling.  Skin: Negative for wound (abrasion lower leg).  Neurological: Negative for syncope, weakness and numbness.    Physical Exam Updated Vital Signs BP 134/69 (BP Location: Right Arm)   Pulse 65   Temp 98.4 F (36.9 C) (Oral)   Resp 19   Ht 5\' 6"  (1.676 m)   Wt 117.9 kg   SpO2 99%   BMI 41.97 kg/m   Physical Exam Vitals and nursing note reviewed.  Constitutional:      General: She is not in acute distress.    Appearance: Normal appearance. She is well-developed.  HENT:     Head: Atraumatic.  Cardiovascular:     Rate and Rhythm: Normal rate and regular rhythm.     Pulses: Normal pulses.  Pulmonary:     Effort: Pulmonary effort is normal.  Musculoskeletal:        General: Tenderness and signs of injury present. Normal range of motion.     Comments: ttp of the anterior right lower leg and lateral right ankle.  No bony deformity.  Compartments are soft.  No posterior lower leg pain , no edema or hematomas.  Abrasion noted to anterior lower leg.    Skin:    General: Skin is warm and dry.  Neurological:     Mental Status: She is alert and oriented to person, place, and time.     Motor: No abnormal  muscle tone.     Coordination: Coordination normal.     ED Results / Procedures / Treatments   Labs (all labs ordered are listed, but only abnormal results are displayed) Labs Reviewed - No data to display  EKG None  Radiology DG Tibia/Fibula Right  Result Date: 03/24/2020 CLINICAL DATA:  Cement bird bath fell on her lower leg. EXAM: RIGHT TIBIA AND FIBULA - 2 VIEW COMPARISON:  Right knee x-rays dated January 01, 2009. FINDINGS: There is no evidence of fracture or other focal bone lesions. Prior right total knee arthroplasty. No evidence of hardware failure or loosening. Soft tissue swelling of the lower leg. IMPRESSION: 1. Soft tissue swelling of the lower leg. No acute osseous abnormality. 2. Right total knee arthroplasty without hardware complication. Electronically Signed   By: January 03, 2009 M.D.   On: 03/24/2020 20:39   DG Ankle Complete Right  Result Date: 03/24/2020 CLINICAL DATA:  Cement bird bath fell on to her right lower leg. EXAM: RIGHT ANKLE - COMPLETE 3+ VIEW COMPARISON:  Right foot x-rays dated May 18, 2019. FINDINGS: No acute fracture or dislocation. Old healed fracture of the fifth metatarsal. The ankle mortise is symmetric. The talar dome is intact. No tibiotalar joint effusion. Joint spaces are preserved. Bone mineralization is normal. Diffuse soft tissue swelling, more prominent laterally. IMPRESSION: Soft tissue swelling.  No acute osseous abnormality. Electronically Signed   By: May 20, 2019 M.D.   On: 03/24/2020 20:41    Procedures Procedures (including critical care time)  Medications Ordered in ED Medications - No data to display  ED Course  I have reviewed the triage vital signs and the nursing notes.  Pertinent labs & imaging results that were available during my care of the patient were reviewed by me and considered in my medical decision making (see chart for details).    MDM Rules/Calculators/A&P                      Pt with mechanical injury  of the right lower leg and ankle.  NV intact.  Compartments soft.  Abrasion present w/o evidence of infection.  XR's are neg for acute bony injury.    ASO brace applied, pt has crutches and walker at home.  Agrees to symptomatic tx and prefers local ortho f/u   Final Clinical Impression(s) / ED Diagnoses Final diagnoses:  Contusion of right lower leg, initial encounter  Contusion of right ankle, initial encounter    Rx / DC Orders ED Discharge Orders    None       Rosey Bath 03/25/20 2314    Bethann Berkshire, MD 03/26/20 1032

## 2020-04-07 ENCOUNTER — Other Ambulatory Visit: Payer: Self-pay | Admitting: Neurology

## 2020-04-13 ENCOUNTER — Encounter (HOSPITAL_COMMUNITY): Payer: Self-pay | Admitting: *Deleted

## 2020-04-13 ENCOUNTER — Other Ambulatory Visit: Payer: Self-pay

## 2020-04-13 ENCOUNTER — Emergency Department (HOSPITAL_COMMUNITY)
Admission: EM | Admit: 2020-04-13 | Discharge: 2020-04-13 | Disposition: A | Discharge status: Home / Self Care | Payer: Medicare Other | Attending: Emergency Medicine | Admitting: Emergency Medicine

## 2020-04-13 DIAGNOSIS — M545 Low back pain, unspecified: Secondary | ICD-10-CM

## 2020-04-13 DIAGNOSIS — E119 Type 2 diabetes mellitus without complications: Secondary | ICD-10-CM | POA: Insufficient documentation

## 2020-04-13 DIAGNOSIS — I10 Essential (primary) hypertension: Secondary | ICD-10-CM | POA: Diagnosis not present

## 2020-04-13 DIAGNOSIS — E039 Hypothyroidism, unspecified: Secondary | ICD-10-CM | POA: Diagnosis not present

## 2020-04-13 DIAGNOSIS — Z79899 Other long term (current) drug therapy: Secondary | ICD-10-CM | POA: Insufficient documentation

## 2020-04-13 DIAGNOSIS — Z7984 Long term (current) use of oral hypoglycemic drugs: Secondary | ICD-10-CM | POA: Diagnosis not present

## 2020-04-13 DIAGNOSIS — Z91040 Latex allergy status: Secondary | ICD-10-CM | POA: Diagnosis not present

## 2020-04-13 MED ORDER — METHOCARBAMOL 500 MG PO TABS: 500.0000 mg | ORAL_TABLET | Freq: Two times a day (BID) | ORAL | 0 refills | Status: DC

## 2020-04-13 NOTE — ED Provider Notes (Signed)
[facility] EMERGENCY DEPARTMENT Provider Note   CSN: [id number] Arrival date & time: 04/13/20  1111     History Chief Complaint  Patient presents with  . Back Pain    [name] is a 64 y.o. female.  HPI Patient is a 64 year old female with a history of chronic back pain. She has had multiple/2 surgeries in the past. She states that both of these were over 20 years ago. She states that she was doing some manual labor several days ago and then started having crampy/achy left-sided back pain that is 7/10, nonradiating, no associated fevers, chills, nausea, vomiting, saddle anesthesia, bowel or bladder incontinence or weakness of the legs. She states that she has radiation of her back pain into her legs however this has been ongoing for some time and is not associated for new back pain symptoms.    Patient takes no medications for her back pain although she does occasionally take ibuprofen she has not taken any for this most recent episode.  Patient has issues with chronic urinary incontinence however she takes medication for this and states that she has had no issues for years as long as she takes her medications.    Past Medical History:  Diagnosis Date  . Asthma   . Chronic back pain   . Chronic leg pain    Bilateral  . Chronic pelvic pain in female   . Essential hypertension   . Fibromyalgia   . H/O cardiac catheterization    No significant CAD (only 20% LAD) 03/02/13 with normal LV function.  Marland Kitchen Headache   . High cholesterol   . Hypothyroidism   . Mental disorder   . OAB (overactive bladder) 05/21/2013  . Obstructive sleep apnea    Noncompliant with CPAP due to claustophobia.  . Panic attacks   . Rectocele 05/21/2013  . Seizures ([place])    1 severe seizure 2015; has had "small ones" since including 9/18  . Type 2 diabetes mellitus [facility])     Patient Active Problem List   Diagnosis Date Noted  . Diabetic neuropathy ([place]) 12/20/2019  . Current moderate episode of  major depressive disorder without prior episode ([place]) 12/20/2019  . Myalgia 12/20/2019  . SUI (stress urinary incontinence, female) 05/05/2018  . Screening for colorectal cancer 05/05/2018  . Encounter for well woman exam with routine gynecological exam 05/05/2018  . Breast pain, right 05/05/2018  . Insomnia 09/13/2017  . Restless leg 09/13/2017  . Superficial fungus infection of skin 05/04/2017  . Bruising 10/14/2016  . Chest pain 10/04/2016  . Hypothyroidism 10/04/2016  . Idiopathic intracranial hypertension 11/19/2015  . Papilledema 09/08/2015  . Left facial numbness 08/22/2015  . Epilepsy, generalized, convulsive ([place]) 06/05/2015  . Obstructive sleep apnea 06/05/2015  . Headache, occipital 06/05/2015  . Neck pain 06/05/2015  . Occipital neuralgia of right side 06/05/2015  . Seizure ([place]) 07/21/2014  . Rectocele 05/21/2013  . OAB (overactive bladder) 05/21/2013  . Difficulty in walking(719.7) 04/11/2013  . Leg weakness, bilateral 04/11/2013  . Diabetes ([place]) 02/28/2013  . Morbid obesity ([place]) 02/28/2013  . Hyperlipidemia 02/28/2013  . CHRONIC OSTEOMYELITIS ANKLE AND FOOT 07/22/2009  . Hypertension 07/22/2009    Past Surgical History:  Procedure Laterality Date  . ABDOMINAL HYSTERECTOMY    . ANKLE SURGERY    . BACK SURGERY    . CARPAL TUNNEL RELEASE    . knee replacements     bilateral  . LEFT HEART CATHETERIZATION WITH CORONARY ANGIOGRAM N/A 03/02/2013   Procedure:  LEFT HEART CATHETERIZATION WITH CORONARY ANGIOGRAM;  Surgeon: Peter M Swaziland, MD;  Location: [facility] CATH LAB;  Service: Cardiovascular;  Laterality: N/A;  . TONSILLECTOMY    . TUBAL LIGATION       OB History    Gravida  2   Para  2   Term  2   Preterm      AB      Living  2     SAB      TAB      Ectopic      Multiple      Live Births  2           Family History  Problem Relation Age of Onset  . Heart attack Mother   . Diabetes Mother   . Hypertension Mother   . Sick sinus  syndrome Brother        Pacemaker  . Congenital heart disease Brother   . Stroke Brother   . Coronary artery disease Brother   . Breast cancer Maternal Aunt   . Cancer Cousin        leukemia    Social History   Tobacco Use  . Smoking status: Former Smoker    Packs/day: 1.00    Years: 2.00    Pack years: 2.00    Types: Cigarettes    Quit date: 12/13/1974    Years since quitting: 45.3  . Smokeless tobacco: Never Used  Substance Use Topics  . Alcohol use: No  . Drug use: No    Home Medications Prior to Admission medications   Medication Sig Start Date End Date Taking? Authorizing Provider  albuterol (PROVENTIL HFA;VENTOLIN HFA) 108 (90 Base) MCG/ACT inhaler Inhale 1-2 puffs into the lungs every 6 (six) hours as needed for wheezing or shortness of breath.    [provider]  ALPRAZolam Prudy Feeler) 0.5 MG tablet Take 1-2 tablets 30-60 min prior to MRI. Can take additional tablet at time of time if needed. Must have driver, can cause drowsiness. 02/07/20   Sater, Pearletha Furl, MD  atorvastatin (LIPITOR) 40 MG tablet Take 40 mg by mouth at bedtime.  10/15/14   [provider]  clobetasol (TEMOVATE) 0.05 % external solution Apply 1 application topically 2 (two) times daily as needed (to scalp for use at 1-2 weeks at one time).  04/04/18   [provider]  DULoxetine (CYMBALTA) 60 MG capsule Take 1 capsule (60 mg total) by mouth daily. 12/20/19   Sater, Pearletha Furl, MD  Fluocinolone Acetonide Scalp 0.01 % OIL Apply 1 application topically daily as needed (applied to scalp for psoriasis).     [provider]  ibuprofen (ADVIL,MOTRIN) 200 MG tablet Take 200-400 mg by mouth daily as needed for mild pain or moderate pain.     [provider]  levETIRAcetam (KEPPRA) 500 MG tablet TAKE ONE TABLET BY MOUTH AT [facility] AND AT BEDTIME 04/07/20   Sater, Pearletha Furl, MD  levothyroxine (SYNTHROID, LEVOTHROID) 100 MCG tablet Take 100 mcg by mouth every morning.  12/31/16    [provider]  metFORMIN (GLUCOPHAGE) 500 MG tablet Take 500 mg by mouth 2 (two) times daily with a meal.  03/26/14   [provider]  methocarbamol (ROBAXIN) 500 MG tablet Take 1 tablet (500 mg total) by mouth 2 (two) times daily. 04/13/20   Gailen Shelter, PA  metoprolol (LOPRESSOR) 50 MG tablet Take 50 mg by mouth 2 (two) times daily.      [provider]  nystatin cream (MYCOSTATIN) Apply 1 application topically 2 (two) times daily as needed for dry skin. 10/07/19   Lazaro Arms, MD  oxybutynin (DITROPAN) 5 MG tablet TAKE 1 TABLET BY MOUTH TWICE DAILY FOR BLADDER. FXD DR FOR NEW SCRIPT. NOT THE SCRIPT. 08/24/19   Lazaro Arms, MD  triamcinolone cream (KENALOG) 0.1 % Apply 1 application topically 2 (two) times daily as needed (for irritation). 10/07/19   Lazaro Arms, MD  zonisamide (ZONEGRAN) 100 MG capsule Take 2 capsules (200 mg total) by mouth at bedtime. 02/25/20   Sater, Pearletha Furl, MD    Allergies    Aspirin, Codeine, and Latex  Review of Systems   Review of Systems  Constitutional: Negative for fever.  HENT: Negative for congestion.   Respiratory: Negative for shortness of breath.   Cardiovascular: Negative for chest pain.  Gastrointestinal: Negative for abdominal distention.  Musculoskeletal: Positive for back pain.  Neurological: Negative for dizziness and headaches.    Physical Exam Updated Vital Signs BP (!) 154/95   Pulse (!) 56   Temp 98.4 F (36.9 C) (Oral)   Ht 5\' 6"  (1.676 m)   Wt 108.9 kg   SpO2 97%   BMI 38.74 kg/m   Physical Exam Vitals and nursing note reviewed.  Constitutional:      General: She is not in acute distress. HENT:     Head: Normocephalic and atraumatic.     Nose: Nose normal.  Eyes:     General: No scleral icterus. Cardiovascular:     Rate and Rhythm: Normal rate and regular rhythm.     Pulses: Normal pulses.     Heart sounds: Normal heart sounds.  Pulmonary:     Effort: Pulmonary effort is normal.  No respiratory distress.     Breath sounds: No wheezing.  Abdominal:     Palpations: Abdomen is soft.     Tenderness: There is no abdominal tenderness.  Musculoskeletal:     Cervical back: Normal range of motion.     Right lower leg: No edema.     Left lower leg: No edema.     Comments: Tenderness to palpation of left lower back at the paralumbar muscles. There are multiple trigger points here. She has very mild palpation of the midline spine over the surgical scar which is well-healed. Full range of motion of spine. No redness, erythema, fluctuance or bruising of the low back.  Strength 5/5 with hip flexion extension, knee flexion extension, hip abduction and abduction,   Skin:    General: Skin is warm and dry.     Capillary Refill: Capillary refill takes less than 2 seconds.  Neurological:     Mental Status: She is alert. Mental status is at baseline.     Comments: Sensation intact bilateral lower extremities.  patellar reflexes symmetric.  Psychiatric:        Mood and Affect: Mood normal.        Behavior: Behavior normal.     ED Results / Procedures / Treatments   Labs (all labs ordered are listed, but only abnormal results are displayed) Labs Reviewed - No data to display  EKG None  Radiology No results found.  Procedures Procedures (including critical care time)  Medications Ordered in ED Medications - No data to display  ED Course  I have reviewed the triage vital signs and the nursing notes.  Pertinent labs & imaging results that were available during my care of the patient were reviewed by me and  considered in my medical decision making (see chart for details).    MDM Rules/Calculators/A&P                       Patient is 64 year old female with past medical history detailed above. Presented today with back pain. She has history of chronic back pain however this appears to be a new type of back pain. She also history of spinal fusion of low  back.  Physical exam is notable for muscular tenderness of the low back. No significant midline tenderness.  Broad differential for back pain considered includes malignancy, disc herniation, spinal epidural abscess, spinal fracture, cauda equina, pyelonephritis, kidney stone, AAA, AD, pancreatitis, PE and PTX.   History without symptoms of urinary or stool retention or incontinence, neurologic changes such as sensation change or weakness lower extremities, coagulopathy or blood thinner use, is not elderly or with history of osteoporosis, denies any history of cancer, fever, IV drug use, weight changes (unexplained), or prolonged steroid use.   Physical exam most consistent with muscular strain. Doubt cauda equina or disc herniation d/t lack of saddle anesthesia/bowel or bladder incontinence or urinary retention, normal gait and reassuring physical examination without neurologic deficits.   History is not supportive of kidney stone, AAA, AD, pancreatitis, PE or PTX. Patient has no CVA tenderness or urinary sx to suggest pyelonephritis or kidney stone.   Will manage patient conservatively at this time. NSAIDs, back exercises/stretches, heat therapy and follow up with PCP if symptoms do not resolve in 3-4 weeks. Patient offered muscle relaxer for comfort at night. Counseled on need to return to ED for fever, worsening or concerning symptoms. Patient agreeable to plan and states understanding of follow up plans and return precautions.    Vitals WNL at time of discharge and patient is no acute distress is ambulatory without difficulty.  We'll discharge patient with conservative therapy and close follow-up with her PCP. Will use Robaxin for pain. As well as Tylenol and iburofen.   Final Clinical Impression(s) / ED Diagnoses Final diagnoses:  Left-sided low back pain without sciatica, unspecified chronicity    Rx / DC Orders ED Discharge Orders         Ordered    methocarbamol (ROBAXIN) 500 MG  tablet  2 times daily,   Status:  Discontinued     04/13/20 1203    methocarbamol (ROBAXIN) 500 MG tablet  2 times daily     04/13/20 [address] [place], [place] 04/13/20 1210    Vanetta Mulders, MD 04/17/20 2012

## 2020-04-13 NOTE — Discharge Instructions (Addendum)
Please use muscle relaxer Robaxin for pain. Please use ibuprofen 600 mg every 6 hours while awake. You may also use 500 mg of Tylenol every 6 hours. Please follow-up with primary care doctor. Please return to ED for any new or concerning symptom such as as we discussed  Please use the muscle relaxer I have prescribed you for pain. Warm salt water/Epson salt soaks, massage, icy hot/Biofreeze/BenGay and other similar products can help with symptoms.  Please return to the emergency department for reevaluation if you denies any new or concerning symptoms

## 2020-04-13 NOTE — ED Triage Notes (Signed)
Pt with lower back pain that radiates down bilateral legs per pt.  Denies loss of control of bladder or bowels. Pt with hx of chronic back pain.

## 2020-04-26 DIAGNOSIS — R569 Unspecified convulsions: Secondary | ICD-10-CM | POA: Diagnosis not present

## 2020-04-28 DIAGNOSIS — E782 Mixed hyperlipidemia: Secondary | ICD-10-CM | POA: Diagnosis not present

## 2020-04-28 DIAGNOSIS — E1140 Type 2 diabetes mellitus with diabetic neuropathy, unspecified: Secondary | ICD-10-CM | POA: Diagnosis not present

## 2020-04-28 DIAGNOSIS — E039 Hypothyroidism, unspecified: Secondary | ICD-10-CM | POA: Diagnosis not present

## 2020-04-28 DIAGNOSIS — I10 Essential (primary) hypertension: Secondary | ICD-10-CM | POA: Diagnosis not present

## 2020-04-28 DIAGNOSIS — R945 Abnormal results of liver function studies: Secondary | ICD-10-CM | POA: Diagnosis not present

## 2020-05-14 DIAGNOSIS — E1140 Type 2 diabetes mellitus with diabetic neuropathy, unspecified: Secondary | ICD-10-CM | POA: Diagnosis not present

## 2020-05-14 DIAGNOSIS — E039 Hypothyroidism, unspecified: Secondary | ICD-10-CM | POA: Diagnosis not present

## 2020-05-14 DIAGNOSIS — R945 Abnormal results of liver function studies: Secondary | ICD-10-CM | POA: Diagnosis not present

## 2020-05-14 DIAGNOSIS — I10 Essential (primary) hypertension: Secondary | ICD-10-CM | POA: Diagnosis not present

## 2020-05-14 DIAGNOSIS — E782 Mixed hyperlipidemia: Secondary | ICD-10-CM | POA: Diagnosis not present

## 2020-05-19 ENCOUNTER — Encounter: Payer: Self-pay | Admitting: Family Medicine

## 2020-05-19 ENCOUNTER — Other Ambulatory Visit: Payer: Self-pay

## 2020-05-19 ENCOUNTER — Ambulatory Visit (INDEPENDENT_AMBULATORY_CARE_PROVIDER_SITE_OTHER): Payer: Medicare Other | Admitting: Family Medicine

## 2020-05-19 VITALS — BP 102/76 | HR 48 | Ht 66.0 in | Wt 250.4 lb

## 2020-05-19 DIAGNOSIS — R519 Headache, unspecified: Secondary | ICD-10-CM | POA: Diagnosis not present

## 2020-05-19 DIAGNOSIS — R569 Unspecified convulsions: Secondary | ICD-10-CM | POA: Diagnosis not present

## 2020-05-19 DIAGNOSIS — G4733 Obstructive sleep apnea (adult) (pediatric): Secondary | ICD-10-CM | POA: Diagnosis not present

## 2020-05-19 DIAGNOSIS — F321 Major depressive disorder, single episode, moderate: Secondary | ICD-10-CM

## 2020-05-19 NOTE — Progress Notes (Signed)
I have read the note, and I agree with the clinical assessment and plan.  Ronald Vinsant A. Gabriell Casimir, MD, PhD, FAAN Certified in Neurology, Clinical Neurophysiology, Sleep Medicine, Pain Medicine and Neuroimaging  [facility] [address], [address] [place], [place] [address] [phone]  

## 2020-05-19 NOTE — Patient Instructions (Addendum)
We will continue levetiracetam 500mg  twice daily zonsamide 200mg  at bedtime   Increase duloxetine 60mg  twice daily   Try to avoid regular use of ibuprofen. Stay well hydrated (50 ounces daily). Healthy diet and regular exercise advised.   We will see if we can repeat your sleep study to readdress need for CPAP  Get updated eye exam.   Follow up with Dr in 6 months, sooner if needed     According to [facility] law, you can not drive unless you are seizure / syncope free for at least 6 months and under physician's care.  Please maintain precautions. Do not participate in activities where a loss of awareness could harm you or someone else. No swimming alone, no tub bathing, no hot tubs, no driving, no operating motorized vehicles (cars, ATVs, motocycles, etc), lawnmowers, power tools or firearms. No standing at heights, such as rooftops, ladders or stairs. Avoid hot objects such as stoves, heaters, open fires. Wear a helmet when riding a bicycle, scooter, skateboard, etc. and avoid areas of traffic. Set your water heater to 120 degrees or less.    Migraine Headache A migraine headache is a very strong throbbing pain on one side or both sides of your head. This type of headache can also cause other symptoms. It can last from 4 hours to 3 days. Talk with your doctor about what things may bring on (trigger) this condition. What are the causes? The exact cause of this condition is not known. This condition may be triggered or caused by:  Drinking alcohol.  Smoking.  Taking medicines, such as: ? Medicine used to treat chest pain (nitroglycerin). ? Birth control pills. ? Estrogen. ? Some blood pressure medicines.  Eating or drinking certain products.  Doing physical activity. Other things that may trigger a migraine headache include:  Having a menstrual period.  Pregnancy.  Hunger.  Stress.  Not getting enough sleep or getting too much sleep.  Weather changes.  Tiredness  (fatigue). What increases the risk?  Being 32-66 years old.  Being female.  Having a family history of migraine headaches.  Being Caucasian.  Having depression or anxiety.  Being very overweight. What are the signs or symptoms?  A throbbing pain. This pain may: ? Happen in any area of the head, such as on one side or both sides. ? Make it hard to do daily activities. ? Get worse with physical activity. ? Get worse around bright lights or loud noises.  Other symptoms may include: ? Feeling sick to your stomach (nauseous). ? Vomiting. ? Dizziness. ? Being sensitive to bright lights, loud noises, or smells.  Before you get a migraine headache, you may get warning signs (an aura). An aura may include: ? Seeing flashing lights or having blind spots. ? Seeing bright spots, halos, or zigzag lines. ? Having tunnel vision or blurred vision. ? Having numbness or a tingling feeling. ? Having trouble talking. ? Having weak muscles.  Some people have symptoms after a migraine headache (postdromal phase), such as: ? Tiredness. ? Trouble thinking (concentrating). How is this treated?  Taking medicines that: ? Relieve pain. ? Relieve the feeling of being sick to your stomach. ? Prevent migraine headaches.  Treatment may also include: ? Having acupuncture. ? Avoiding foods that bring on migraine headaches. ? Learning ways to control your body functions (biofeedback). ? Therapy to help you know and deal with negative thoughts (cognitive behavioral therapy). Follow these instructions at home: Medicines  Take over-the-counter and prescription  medicines only as told by your doctor.  Ask your doctor if the medicine prescribed to you: ? Requires you to avoid driving or using heavy machinery. ? Can cause trouble pooping (constipation). You may need to take these steps to prevent or treat trouble pooping:  Drink enough fluid to keep your pee (urine) pale yellow.  Take  over-the-counter or prescription medicines.  Eat foods that are high in fiber. These include beans, whole grains, and fresh fruits and vegetables.  Limit foods that are high in fat and sugar. These include fried or sweet foods. Lifestyle  Do not drink alcohol.  Do not use any products that contain nicotine or tobacco, such as cigarettes, e-cigarettes, and chewing tobacco. If you need help quitting, ask your doctor.  Get at least 8 hours of sleep every night.  Limit and deal with stress. General instructions      Keep a journal to find out what may bring on your migraine headaches. For example, write down: ? What you eat and drink. ? How much sleep you get. ? Any change in what you eat or drink. ? Any change in your medicines.  If you have a migraine headache: ? Avoid things that make your symptoms worse, such as bright lights. ? It may help to lie down in a dark, quiet room. ? Do not drive or use heavy machinery. ? Ask your doctor what activities are safe for you.  Keep all follow-up visits as told by your doctor. This is important. Contact a doctor if:  You get a migraine headache that is different or worse than others you have had.  You have more than 15 headache days in one month. Get help right away if:  Your migraine headache gets very bad.  Your migraine headache lasts longer than 72 hours.  You have a fever.  You have a stiff neck.  You have trouble seeing.  Your muscles feel weak or like you cannot control them.  You start to lose your balance a lot.  You start to have trouble walking.  You pass out (faint).  You have a seizure. Summary  A migraine headache is a very strong throbbing pain on one side or both sides of your head. These headaches can also cause other symptoms.  This condition may be treated with medicines and changes to your lifestyle.  Keep a journal to find out what may bring on your migraine headaches.  Contact a doctor if you  get a migraine headache that is different or worse than others you have had.  Contact your doctor if you have more than 15 headache days in a month. This information is not intended to replace advice given to you by your health care provider. Make sure you discuss any questions you have with your health care provider. Document Revised: 03/23/2019 Document Reviewed: 01/11/2019 Elsevier Patient Education  [place].   Analgesic Rebound Headache An analgesic rebound headache, sometimes called a medication overuse headache, is a headache that comes after pain medicine (analgesic) taken to treat the original (primary) headache has worn off. Any type of primary headache can return as a rebound headache if a person regularly takes analgesics more than three times a week to treat it. The types of primary headaches that are commonly associated with rebound headaches include:  Migraines.  Headaches that arise from tense muscles in the head and neck area (tension headaches).  Headaches that develop and happen again (recur) on one side of the head  and around the eye (cluster headaches). If rebound headaches continue, they become chronic daily headaches. What are the causes? This condition may be caused by frequent use of:  Over-the-counter medicines such as aspirin, ibuprofen, and acetaminophen.  Sinus relief medicines and other medicines that contain caffeine.  Narcotic pain medicines such as codeine and oxycodone. What are the signs or symptoms? The symptoms of a rebound headache are the same as the symptoms of the original headache. Some of the symptoms of specific types of headaches include: Migraine headache  Pulsing or throbbing pain on one or both sides of the head.  Severe pain that interferes with daily activities.  Pain that is worsened by physical activity.  Nausea, vomiting, or both.  Pain with exposure to bright light, loud noises, or strong smells.  General  sensitivity to bright light, loud noises, or strong smells.  Visual changes.  Numbness of one or both arms. Tension headache  Pressure around the head.  Dull, aching head pain.  Pain felt over the front and sides of the head.  Tenderness in the muscles of the head, neck, and shoulders. Cluster headache  Severe pain that begins in or around one eye or temple.  Redness and tearing in the eye on the same side as the pain.  Droopy or swollen eyelid.  One-sided head pain.  Nausea.  Runny nose.  Sweaty, pale facial skin.  Restlessness. How is this diagnosed? This condition is diagnosed by:  Reviewing your medical history. This includes the nature of your primary headaches.  Reviewing the types of pain medicines that you have been using to treat your headaches and how often you take them. How is this treated? This condition may be treated or managed by:  Discontinuing frequent use of the analgesic medicine. Doing this may worsen your headaches at first, but the pain should eventually become more manageable, less frequent, and less severe.  Seeing a headache specialist. He or she may be able to help you manage your headaches and help make sure there is not another cause of the headaches.  Using methods of stress relief, such as acupuncture, counseling, biofeedback, and massage. Talk with your health care provider about which methods might be good for you. Follow these instructions at home:  Take over-the-counter and prescription medicines only as told by your health care provider.  Stop the repeated use of pain medicine as told by your health care provider. Stopping can be difficult. Carefully follow instructions from your health care provider.  Avoid triggers that are known to cause your primary headaches.  Keep all follow-up visits as told by your health care provider. This is important. Contact a health care provider if:  You continue to experience headaches after  following treatments that your health care provider recommended. Get help right away if:  You develop new headache pain.  You develop headache pain that is different than what you have experienced in the past.  You develop numbness or tingling in your arms or legs.  You develop changes in your speech or vision. This information is not intended to replace advice given to you by your health care provider. Make sure you discuss any questions you have with your health care provider. Document Revised: 11/11/2017 Document Reviewed: 05/03/2016 Elsevier Patient Education  2020 [employer] Inc[employer]   Myofascial Pain Syndrome and Fibromyalgia Myofascial pain syndrome and fibromyalgia are both pain disorders. This pain may be felt mainly in your muscles.  Myofascial pain syndrome: ? Always has tender points in  the muscle that will cause pain when pressed (trigger points). The pain may come and go. ? Usually affects your neck, upper back, and shoulder areas. The pain often radiates into your arms and hands.  Fibromyalgia: ? Has muscle pains and tenderness that come and go. ? Is often associated with fatigue and sleep problems. ? Has trigger points. ? Tends to be long-lasting (chronic), but is not life-threatening. Fibromyalgia and myofascial pain syndrome are not the same. However, they often occur together. If you have both conditions, each can make the other worse. Both are common and can cause enough pain and fatigue to make day-to-day activities difficult. Both can be hard to diagnose because their symptoms are common in many other conditions. What are the causes? The exact causes of these conditions are not known. What increases the risk? You are more likely to develop this condition if:  You have a family history of the condition.  You have certain triggers, such as: ? Spine disorders. ? An injury (trauma) or other physical stressors. ? Being under a lot of stress. ? Medical conditions  such as osteoarthritis, rheumatoid arthritis, or lupus. What are the signs or symptoms? Fibromyalgia The main symptom of fibromyalgia is widespread pain and tenderness in your muscles. Pain is sometimes described as stabbing, shooting, or burning. You may also have:  Tingling or numbness.  Sleep problems and fatigue.  Problems with attention and concentration (fibro fog). Other symptoms may include:  Bowel and bladder problems.  Headaches.  Visual problems.  Problems with odors and noises.  Depression or mood changes.  Painful menstrual periods (dysmenorrhea).  Dry skin or eyes. These symptoms can vary over time. Myofascial pain syndrome Symptoms of myofascial pain syndrome include:  Tight, ropy bands of muscle.  Uncomfortable sensations in muscle areas. These may include aching, cramping, burning, numbness, tingling, and weakness.  Difficulty moving certain parts of the body freely (poor range of motion). How is this diagnosed? This condition may be diagnosed by your symptoms and medical history. You will also have a physical exam. In general:  Fibromyalgia is diagnosed if you have pain, fatigue, and other symptoms for more than 3 months, and symptoms cannot be explained by another condition.  Myofascial pain syndrome is diagnosed if you have trigger points in your muscles, and those trigger points are tender and cause pain elsewhere in your body (referred pain). How is this treated? Treatment for these conditions depends on the type that you have.  For fibromyalgia: ? Pain medicines, such as NSAIDs. ? Medicines for treating depression. ? Medicines for treating seizures. ? Medicines that relax the muscles.  For myofascial pain: ? Pain medicines, such as NSAIDs. ? Cooling and stretching of muscles. ? Trigger point injections. ? Sound wave (ultrasound) treatments to stimulate muscles. Treating these conditions often requires a team of health care providers.  These may include:  Your primary care provider.  Physical therapist.  Complementary health care providers, such as massage therapists or acupuncturists.  Psychiatrist for cognitive behavioral therapy. Follow these instructions at home: Medicines  Take over-the-counter and prescription medicines only as told by your health care provider.  Do not drive or use heavy machinery while taking prescription pain medicine.  If you are taking prescription pain medicine, take actions to prevent or treat constipation. Your health care provider may recommend that you: ? Drink enough fluid to keep your urine pale yellow. ? Eat foods that are high in fiber, such as fresh fruits and vegetables, whole grains, and  beans. ? Limit foods that are high in fat and processed sugars, such as fried or sweet foods. ? Take an over-the-counter or prescription medicine for constipation. Lifestyle   Exercise as directed by your health care provider or physical therapist.  Practice relaxation techniques to control your stress. You may want to try: ? Biofeedback. ? Visual imagery. ? Hypnosis. ? Muscle relaxation. ? Yoga. ? Meditation.  Maintain a healthy lifestyle. This includes eating a healthy diet and getting enough sleep.  Do not use any products that contain nicotine or tobacco, such as cigarettes and e-cigarettes. If you need help quitting, ask your health care provider. General instructions  Talk to your health care provider about complementary treatments, such as acupuncture or massage.  Consider joining a support group with others who are diagnosed with this condition.  Do not do activities that stress or strain your muscles. This includes repetitive motions and heavy lifting.  Keep all follow-up visits as told by your health care provider. This is important. Where to find more information  National Fibromyalgia Association: www.fmaware.org  Arthritis Foundation:  www.arthritis.org  American Chronic Pain Association: www.theacpa.org Contact a health care provider if:  You have new symptoms.  Your symptoms get worse or your pain is severe.  You have side effects from your medicines.  You have trouble sleeping.  Your condition is causing depression or anxiety. Summary  Myofascial pain syndrome and fibromyalgia are pain disorders.  Myofascial pain syndrome has tender points in the muscle that will cause pain when pressed (trigger points). Fibromyalgia also has muscle pains and tenderness that come and go, but this condition is often associated with fatigue and sleep disturbances.  Fibromyalgia and myofascial pain syndrome are not the same but often occur together, causing pain and fatigue that make day-to-day activities difficult.  Treatment for fibromyalgia includes taking medicines to relax the muscles and medicines for pain, depression, or seizures. Treatment for myofascial pain syndrome includes taking medicines for pain, cooling and stretching of muscles, and injecting medicines into trigger points.  Follow your health care provider's instructions for taking medicines and maintaining a healthy lifestyle. This information is not intended to replace advice given to you by your health care provider. Make sure you discuss any questions you have with your health care provider. Document Revised: 03/23/2019 Document Reviewed: 12/14/2017 Elsevier Patient Education  2020 [employer].   Seizure, Adult A seizure is a sudden burst of abnormal electrical activity in the brain. Seizures usually last from 30 seconds to 2 minutes. They can cause many different symptoms. Usually, seizures are not harmful unless they last a long time. What are the causes? Common causes of this condition include:  Fever or infection.  Conditions that affect the brain, such as: ? A brain abnormality that you were born with. ? A brain or head injury. ? Bleeding in the  brain. ? A tumor. ? Stroke. ? Brain disorders such as autism or cerebral palsy.  Low blood sugar.  Conditions that are passed from parent to child (are inherited).  Problems with substances, such as: ? Having a reaction to a drug or a medicine. ? Suddenly stopping the use of a substance (withdrawal). In some cases, the cause may not be known. A person who has repeated seizures over time without a clear cause has a condition called epilepsy. What increases the risk? You are more likely to get this condition if you have:  A family history of epilepsy.  Had a seizure in the past.  A brain disorder.  A history of head injury, lack of oxygen at birth, or strokes. What are the signs or symptoms? There are many types of seizures. The symptoms vary depending on the type of seizure you have. Examples of symptoms during a seizure include:  Shaking (convulsions).  Stiffness in the body.  Passing out (losing consciousness).  Head nodding.  Staring.  Not responding to sound or touch.  Loss of bladder control and bowel control. Some people have symptoms right before and right after a seizure happens. Symptoms before a seizure may include:  Fear.  Worry (anxiety).  Feeling like you may vomit (nauseous).  Feeling like the room is spinning (vertigo).  Feeling like you saw or heard something before (dj vu).  Odd tastes or smells.  Changes in how you see. You may see flashing lights or spots. Symptoms after a seizure happens can include:  Confusion.  Sleepiness.  Headache.  Weakness on one side of the body. How is this treated? Most seizures will stop on their own in under 5 minutes. In these cases, no treatment is needed. Seizures that last longer than 5 minutes will usually need treatment. Treatment can include:  Medicines given through an IV tube.  Avoiding things that are known to cause your seizures. These can include medicines that you take for another  condition.  Medicines to treat epilepsy.  Surgery to stop the seizures. This may be needed if medicines do not help. Follow these instructions at home: Medicines  Take over-the-counter and prescription medicines only as told by your doctor.  Do not eat or drink anything that may keep your medicine from working, such as alcohol. Activity  Do not do any activities that would be dangerous if you had another seizure, like driving or swimming. Wait until your doctor says it is safe for you to do them.  If you live in the [place] ask your local DMV (department of motor vehicles) when you can drive.  Get plenty of rest. Teaching others Teach friends and family what to do when you have a seizure. They should:  Lay you on the ground.  Protect your head and body.  Loosen any tight clothing around your neck.  Turn you on your side.  Not hold you down.  Not put anything into your mouth.  Know whether or not you need emergency care.  Stay with you until you are better.  General instructions  Contact your doctor each time you have a seizure.  Avoid anything that gives you seizures.  Keep a seizure diary. Write down: ? What you think caused each seizure. ? What you remember about each seizure.  Keep all follow-up visits as told by your doctor. This is important. Contact a doctor if:  You have another seizure.  You have seizures more often.  There is any change in what happens during your seizures.  You keep having seizures with treatment.  You have symptoms of being sick or having an infection. Get help right away if:  You have a seizure that: ? Lasts longer than 5 minutes. ? Is different than seizures you had before. ? Makes it harder to breathe. ? Happens after you hurt your head.  You have any of these symptoms after a seizure: ? Not being able to speak. ? Not being able to use a part of your body. ? Confusion. ? A bad headache.  You have two or more  seizures in a row.  You do not wake up  right after a seizure.  You get hurt during a seizure. These symptoms may be an emergency. Do not wait to see if the symptoms will go away. Get medical help right away. Call your local emergency services (911 in the [place] Do not drive yourself to the hospital. Summary  Seizures usually last from 30 seconds to 2 minutes. Usually, they are not harmful unless they last a long time.  Do not eat or drink anything that may keep your medicine from working, such as alcohol.  Teach friends and family what to do when you have a seizure.  Contact your doctor each time you have a seizure. This information is not intended to replace advice given to you by your health care provider. Make sure you discuss any questions you have with your health care provider. Document Revised: 02/16/2019 Document Reviewed: 02/16/2019 Elsevier Patient Education  2020 [employer].

## 2020-05-19 NOTE — Progress Notes (Signed)
PATIENT: [name] DOB: 1956/04/03  REASON FOR VISIT: follow up HISTORY FROM: patient  Chief Complaint  Patient presents with  . Follow-up    Rm 2, has dizziness/headaches.  has had 2 falls, bruised knees.  Zonegran not helping with headaches.  She states does not want to be bothered, sleeps when has headaches.   Uses cpap, not aware of DME. Had do ESS.  . Seizures/ Heaches     HISTORY OF PRESENT ILLNESS: Today 05/19/20 [name] is a 64 y.o. female here today for follow up for dizziness/headaches/seizures.   She continues levetiracetam 500mg  BID and zonisamide 200mg  at bedtime.  She reports that "she fell out" a couple of weeks ago. Her grandson heard noises and came to help. He reports that she was laying on her stomach, "shaking all over". Patient reports that she was calling out to her momma who is deceased. No incontinence or tongue biting. EMS was called but she refused treatment. She reports that EMS told her it sounded like she had a panic attack. She had been out of "all of her medication" for at least a week.   She has a headache daily. She wakes with a headache every day. She reports that headaches are in the back of her head on both side and radiates to the top of her head. Described as pressure. She feels that her head is going to explode. Occasional nausea, light or sound sensitivity. Dizziness comes and goes. She will take 400mg  ibuprofen in the mornings and again at night. She reports using CPAP every night. She has not received new supplies or been seen for follow up "in many years". He machine is over 62 years old. Original sleep study performed "many years ago".   Duloxetine 60mg  was added in January. She feels that this has helped with depression and sleep. She feels that her pain is a little better as well. She continues to have fatigue. She wants to take naps during the day if she gets a chance. She takes care of two great grandchildren (2 yr old and 1 yr  old) and a foster child (97yr). Her granddaughter is expecting her third child. She is on "complete bed rest" at [redacted] weeks gestation. Patient's husband helps her at home.    HISTORY: (copied from Dr 9 note on 12/20/2019)  [name] is a 64 year old woman with a h/o a generalized tonic-clonic seizure in 2016 and possible other seizures who also has neck pain,  headaches and obstructive sleep apnea.  Update 12/20/2019: She has not had any more seizures or other spells.    She thinks she tolerates the medications well.  She has left >> right neck pain.    Pain is less laying on her left side and worse when she holds head up.     Pain occasionally radiates into the left arm and with numbness in the same area.     In 2018, MRI cervical showed DJD at C6-C7 without spinal stenosis but there was no nerve root compression.   Splenius capitus TPI/GON injection did not help.  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  She notes much more depression.  She has a lot of family stress (takes care of 2 great grand-kids and a foster child and granddaughter lives with her).   She has never been on an antidepressant but has had depression a few times in the past.   Her mother had  frequent depression.  Her granddaughter is bipolar.     She also has OSA and uses CPAP nightly.   She does not sleep well as her granddaughter works evenings until 1-2 am and she needs to get her 61 yo foster child to school early in the morning.    Update 7/6/22020: On 06/07/19,  her family called 911 as she was not waking up well.    She had bradycardia in the ED.   CBC, CMP and Ammonia and drug screen were fine.   CT showed unchanged mild atrophy and white matter changes but no acute findings.   She has had only one GTC seizure in 2016 but has had some zoned out spells.   This event had no GTC or incontinence.   She is on Keppra and never misses a dose.  She has a chronic headache at  the top of her head sometimes associated with numbness in her face.   No nausea but she has photophobia and phonophobia.   She has been on imipramine x 1 month   She has OSA but uses CPAP nightly.   She does not sleep well.    Update 05/24/2018: She is noting right sided occipital pain and headache.   Pain is better laying down with a pillow under her neck.    Pain is constant with superimposed needle-like sensation.    She denies N/V.  She has some photophobia and phonophobia.   Ibuprofen takes the edge off for a couple hours.   She had an MRI of the brain and MRI of the cervical spine that were read as normal for age. Specifically the cervical MRI just shows a C6C7 bulge.     She has OSA and uses CPAP nightly.   Sometimes she takes it off if her grandson sleeps with her since he doesn't like her to wear it.  She will be getting permanent custody of her grandson.     She has not had any seizures since 2016.   She is on Keppra and tolerates it well.    Update 09/13/2017:   She has had a mil daily headache x one month or more but much more severe the last week.    HA is right sided and throbbing.  She has no N/V.   She has photophobia > phonophobia.    Moving the head worsens the pain when more intense.    Ibuprofen takes the edge off for a few hours and then HA returns more intensely.    She went to the ED 09/04/2017 with ingtense HA and chest pain.    NTG w as given which worsened the HA.  She was admitted x 1 day and was given toradol, lorazepam, morphine and Zofran.  She had an MRI of the brain and MRI of the cervical spine that were read as normal for age. Specifically the cervical MRI just shows a C6C7 bulge.     She has only worn CPAP intermittently and we discussed trying to wear it the entire night.     She notes insomnia, sleep onset and sleep maintenance issues.   She laso has to move  She has not had any seizures since 2016    _____________________________________________________- From 05/19/2016 Seizure: She denies any seizures since the last visit.   In August 2015, she had generalized tonic-clonic seizure activity that was witnessed by her husband. She has had no prior seizures and none since.   She continues is on Keppra.  An MRI of the brain at that time was essentially normal. An EEG was performed and showed 2 runs of generalized spike wave activity, consistent with a generalized seizure disorder. She is unaware of any family history of seizures.    She has not had head trauma in the past. She had a normal childhood development. She has not had any meningitis or encephalitis.  Pseudotumor cerebri/headache and neck pain:   Due to headaches and papilledema, she underwent a LP.    CSF pressure was elevated At 26.5 cm..   She had a lot of bladder issues on Diamox and stopped.   Headaches are mostly occipital.  However, headaches never returned to the severity or frequency that they were before the lumbar puncture. The HA occurs 1 -2 times a week.  Ibuprofen often knocks the headache out, especially if she lays down.   Her blurry vision is better.    A recent eye exam reportedly just showed cataract.   Lightheadedness with standing up is also better.    She reports that her ophthalmologist told her that the optic nerve changes were due to cataracts.  Obstructive sleep apnea: About 4 or 5 years ago she was diagnosed with OSA with an overnight sleep study. She was started on CPAP +6 cm in the past.    However, she had trouble tolerating the mask and stopped.   She is claustophobic so seldom made it through the night with CPAP.    She reports some fatigue and daytime sleepiness.     REVIEW OF SYSTEMS: Out of a complete 14 system review of symptoms, the patient complains only of the following symptoms, headaches, seizure, dizziness, chronic pain, depression, all other reviewed systems are negative.  ALLERGIES: Allergies   Allergen Reactions  . Aspirin Nausea And Vomiting  . Codeine Rash  . Latex Rash    HOME MEDICATIONS: Outpatient Medications Prior to Visit  Medication Sig Dispense Refill  . atorvastatin (LIPITOR) 40 MG tablet Take 40 mg by mouth at bedtime.     . clobetasol (TEMOVATE) 0.05 % external solution Apply 1 application topically 2 (two) times daily as needed (to scalp for use at 1-2 weeks at one time).   2  . DULoxetine (CYMBALTA) 60 MG capsule Take 1 capsule (60 mg total) by mouth daily. 30 capsule 11  . Fluocinolone Acetonide Scalp 0.01 % OIL Apply 1 application topically daily as needed (applied to scalp for psoriasis).     Marland Kitchen ibuprofen (ADVIL,MOTRIN) 200 MG tablet Take 200-400 mg by mouth daily as needed for mild pain or moderate pain.     Marland Kitchen levETIRAcetam (KEPPRA) 500 MG tablet TAKE ONE TABLET BY MOUTH AT BREAKFAST AND AT BEDTIME 60 tablet 5  . levothyroxine (SYNTHROID, LEVOTHROID) 100 MCG tablet Take 100 mcg by mouth every morning.     . metFORMIN (GLUCOPHAGE) 500 MG tablet Take 500 mg by mouth 2 (two) times daily with a meal.     . methocarbamol (ROBAXIN) 500 MG tablet Take 1 tablet (500 mg total) by mouth 2 (two) times daily. 20 tablet 0  . metoprolol (LOPRESSOR) 50 MG tablet Take 50 mg by mouth 2 (two) times daily.      Marland Kitchen nystatin cream (MYCOSTATIN) Apply 1 application topically 2 (two) times daily as needed for dry skin. 30 g 11  . oxybutynin (DITROPAN) 5 MG tablet TAKE 1 TABLET BY MOUTH TWICE DAILY FOR BLADDER. FXD DR FOR NEW SCRIPT. NOT THE SCRIPT. 60 tablet 11  . triamcinolone cream (  KENALOG) 0.1 % Apply 1 application topically 2 (two) times daily as needed (for irritation). 30 g 11  . zonisamide (ZONEGRAN) 100 MG capsule Take 2 capsules (200 mg total) by mouth at bedtime. 60 capsule 5  . albuterol (PROVENTIL HFA;VENTOLIN HFA) 108 (90 Base) MCG/ACT inhaler Inhale 1-2 puffs into the lungs every 6 (six) hours as needed for wheezing or shortness of breath.    . ALPRAZolam (XANAX) 0.5 MG  tablet Take 1-2 tablets 30-60 min prior to MRI. Can take additional tablet at time of time if needed. Must have driver, can cause drowsiness. 3 tablet 0   No facility-administered medications prior to visit.    PAST MEDICAL HISTORY: Past Medical History:  Diagnosis Date  . Asthma   . Chronic back pain   . Chronic leg pain    Bilateral  . Chronic pelvic pain in female   . Essential hypertension   . Fibromyalgia   . H/O cardiac catheterization    No significant CAD (only 20% LAD) 03/02/13 with normal LV function.  Marland Kitchen Headache   . High cholesterol   . Hypothyroidism   . Mental disorder   . OAB (overactive bladder) 05/21/2013  . Obstructive sleep apnea    Noncompliant with CPAP due to claustophobia.  . Panic attacks   . Rectocele 05/21/2013  . Seizures (HCC)    1 severe seizure 2015; has had "small ones" since including 9/18  . Type 2 diabetes mellitus (HCC)     PAST SURGICAL HISTORY: Past Surgical History:  Procedure Laterality Date  . ABDOMINAL HYSTERECTOMY    . ANKLE SURGERY    . BACK SURGERY    . CARPAL TUNNEL RELEASE    . knee replacements     bilateral  . LEFT HEART CATHETERIZATION WITH CORONARY ANGIOGRAM N/A 03/02/2013   Procedure: LEFT HEART CATHETERIZATION WITH CORONARY ANGIOGRAM;  Surgeon: Peter M [place], MD;  Location: [facility] CATH LAB;  Service: Cardiovascular;  Laterality: N/A;  . TONSILLECTOMY    . TUBAL LIGATION      FAMILY HISTORY: Family History  Problem Relation Age of Onset  . Heart attack Mother   . Diabetes Mother   . Hypertension Mother   . Sick sinus syndrome Brother        Pacemaker  . Congenital heart disease Brother   . Stroke Brother   . Coronary artery disease Brother   . Breast cancer Maternal Aunt   . Cancer Cousin        leukemia    SOCIAL HISTORY: Social History   Socioeconomic History  . Marital status: Married    Spouse name: Not on file  . Number of children: Not on file  . Years of education: Not on file  . Highest education  level: Not on file  Occupational History  . Occupation: disabled  Tobacco Use  . Smoking status: Former Smoker    Packs/day: 1.00    Years: 2.00    Pack years: 2.00    Types: Cigarettes    Quit date: 12/13/1974    Years since quitting: 45.4  . Smokeless tobacco: Never Used  Substance and Sexual Activity  . Alcohol use: No  . Drug use: No  . Sexual activity: Not Currently    Birth control/protection: Surgical    Comment: hyst  Other Topics Concern  . Not on file  Social History Narrative  . Not on file   Social Determinants of Health   Financial Resource Strain:   . Difficulty of Paying Living  Expenses:   Food Insecurity:   . Worried About Charity fundraiser in the Last Year:   . Arboriculturist in the Last Year:   Transportation Needs:   . Film/video editor (Medical):   Marland Kitchen Lack of Transportation (Non-Medical):   Physical Activity:   . Days of Exercise per Week:   . Minutes of Exercise per Session:   Stress:   . Feeling of Stress :   Social Connections:   . Frequency of Communication with Friends and Family:   . Frequency of Social Gatherings with Friends and Family:   . Attends Religious Services:   . Active Member of Clubs or Organizations:   . Attends Archivist Meetings:   Marland Kitchen Marital Status:   Intimate Partner Violence:   . Fear of Current or Ex-Partner:   . Emotionally Abused:   Marland Kitchen Physically Abused:   . Sexually Abused:       PHYSICAL EXAM  Vitals:   05/19/20 0707  BP: 102/76  Pulse: (!) 48  SpO2: 95%  Weight: 250 lb 6.4 oz (113.6 kg)  Height: 5\' 6"  (1.676 m)   Body mass index is 40.42 kg/m.  Generalized: Well developed, in no acute distress  Cardiology: normal rate and rhythm, no murmur noted Respiratory: clear to auscultation bilaterally  Neurological examination  Mentation: Alert oriented to time, place, history taking. Follows all commands speech and language fluent Cranial nerve II-XII: Pupils were equal round reactive to  light. Extraocular movements were full, visual field were full on confrontational test. Facial sensation and strength were normal. Head turning and shoulder shrug  were normal and symmetric. Motor: The motor testing reveals 5 over 5 strength of all 4 extremities. Good symmetric motor tone is noted throughout.  Sensory: Sensory testing is intact to soft touch on all 4 extremities. No evidence of extinction is noted.  Coordination: Cerebellar testing reveals good finger-nose-finger and heel-to-shin bilaterally.  Gait and station: Gait is wide,   DIAGNOSTIC DATA (LABS, IMAGING, TESTING) - I reviewed patient records, labs, notes, testing and imaging myself where available.  No flowsheet data found.   Lab Results  Component Value Date   WBC 7.9 09/06/2019   HGB 14.5 09/06/2019   HCT 47.1 (H) 09/06/2019   MCV 92.9 09/06/2019   PLT 210 09/06/2019      Component Value Date/Time   NA 139 09/06/2019 0027   K 4.4 09/06/2019 0027   CL 105 09/06/2019 0027   CO2 20 (L) 09/06/2019 0027   GLUCOSE 96 09/06/2019 0027   BUN 17 09/06/2019 0027   CREATININE 0.94 09/06/2019 0027   CALCIUM 9.6 09/06/2019 0027   PROT 6.8 06/07/2019 1452   ALBUMIN 3.9 06/07/2019 1452   AST 23 06/07/2019 1452   ALT 20 06/07/2019 1452   ALKPHOS 74 06/07/2019 1452   BILITOT 0.9 06/07/2019 1452   GFRNONAA >60 09/06/2019 0027   GFRAA >60 09/06/2019 0027   Lab Results  Component Value Date   CHOL 119 09/05/2017   HDL 40 (L) 09/05/2017   LDLCALC 65 09/05/2017   TRIG 69 09/05/2017   CHOLHDL 3.0 09/05/2017   Lab Results  Component Value Date   HGBA1C 5.3 09/04/2017   Lab Results  Component Value Date   VITAMINB12 221 08/22/2015   Lab Results  Component Value Date   TSH 0.109 (L) 09/04/2017     ASSESSMENT AND PLAN 64 y.o. year old female  has a past medical history of Asthma, Chronic back pain,  Chronic leg pain, Chronic pelvic pain in female, Essential hypertension, Fibromyalgia, H/O cardiac  catheterization, Headache, High cholesterol, Hypothyroidism, Mental disorder, OAB (overactive bladder) (05/21/2013), Obstructive sleep apnea, Panic attacks, Rectocele (05/21/2013), Seizures (HCC), and Type 2 diabetes mellitus (HCC). here with     ICD-10-CM   1. Seizure (HCC)  R56.9   2. Obstructive sleep apnea  G47.33   3. Headache, occipital  R51.9   4. Current moderate episode of major depressive disorder without prior episode (HCC)  F32.1     Fabiha is doing fairly well today.  Depression, fibromyalgia pain and sleep have improved with addition of duloxetine 60 mg daily.  I would like to increase this dose to 60 mg twice daily.  We will continue levetiracetam 500 mg twice daily and zonisamide 200 mg at bedtime.  I am uncertain if the event that happened on Mother's Day was truly after activity.  There were concerns for event being a panic attack by EMS.  She had also been out of her medication for over a week.  I have advised no driving for 6 months out of an abundance of precaution.  She was advised to avoid missed doses of antibiotic medications.  Seizure precautions reviewed.  We have discussed concerns of sleep apnea in detail.  She reports that sleep study was done many years ago and has not had new supplies in at least 5 years.  She is unsure who was previously managing her CPAP.  I will place orders today for a new sleep study.  We will plan to update her CPAP machine and supplies pending sleep study.  We have reviewed risk of untreated sleep apnea.  I am concerned that this may be contributing to her chronic headaches, chronic pain and intermittent dizziness.  She was encouraged to work on healthy lifestyle habits with regular exercise and well-balanced diet.  She will return to see [place] in 3-6 months, sooner if needed.  She verbalizes understanding and agreement with this plan.   No orders of the defined types were placed in this encounter.    No orders of the defined types were placed in this  encounter.     I spent 30 minutes with the patient. 50% of this time was spent counseling and educating patient on plan of care and medications.     Shawnie Dapper, FNP-C 05/19/2020, 9:57 AM [facility] [address] [place], [place] [address] [phone]

## 2020-05-21 ENCOUNTER — Other Ambulatory Visit: Payer: Self-pay | Admitting: *Deleted

## 2020-05-21 DIAGNOSIS — G4733 Obstructive sleep apnea (adult) (pediatric): Secondary | ICD-10-CM

## 2020-05-27 ENCOUNTER — Telehealth: Payer: Self-pay

## 2020-05-27 NOTE — Telephone Encounter (Signed)
LVM for pt to call me back to schedule sleep study  

## 2020-06-10 ENCOUNTER — Telehealth: Payer: Self-pay

## 2020-06-10 NOTE — Telephone Encounter (Signed)
LVM for pt to call me back to schedule sleep study  We have attempted to call the patient two times to schedule sleep study.  Patient has been unavailable at the phone numbers we have on file and has not returned our calls.  If patient calls back we will schedule them for their sleep study.  

## 2020-06-26 DIAGNOSIS — E039 Hypothyroidism, unspecified: Secondary | ICD-10-CM | POA: Diagnosis not present

## 2020-06-26 DIAGNOSIS — E1140 Type 2 diabetes mellitus with diabetic neuropathy, unspecified: Secondary | ICD-10-CM | POA: Diagnosis not present

## 2020-06-26 DIAGNOSIS — E782 Mixed hyperlipidemia: Secondary | ICD-10-CM | POA: Diagnosis not present

## 2020-06-26 DIAGNOSIS — I10 Essential (primary) hypertension: Secondary | ICD-10-CM | POA: Diagnosis not present

## 2020-06-26 DIAGNOSIS — R945 Abnormal results of liver function studies: Secondary | ICD-10-CM | POA: Diagnosis not present

## 2020-07-16 DIAGNOSIS — E782 Mixed hyperlipidemia: Secondary | ICD-10-CM | POA: Diagnosis not present

## 2020-07-16 DIAGNOSIS — I10 Essential (primary) hypertension: Secondary | ICD-10-CM | POA: Diagnosis not present

## 2020-07-16 DIAGNOSIS — E1140 Type 2 diabetes mellitus with diabetic neuropathy, unspecified: Secondary | ICD-10-CM | POA: Diagnosis not present

## 2020-07-16 DIAGNOSIS — R945 Abnormal results of liver function studies: Secondary | ICD-10-CM | POA: Diagnosis not present

## 2020-07-16 DIAGNOSIS — E039 Hypothyroidism, unspecified: Secondary | ICD-10-CM | POA: Diagnosis not present

## 2020-07-18 DIAGNOSIS — E1121 Type 2 diabetes mellitus with diabetic nephropathy: Secondary | ICD-10-CM | POA: Diagnosis not present

## 2020-07-18 DIAGNOSIS — L84 Corns and callosities: Secondary | ICD-10-CM | POA: Diagnosis not present

## 2020-07-18 DIAGNOSIS — R252 Cramp and spasm: Secondary | ICD-10-CM | POA: Diagnosis not present

## 2020-08-25 ENCOUNTER — Other Ambulatory Visit: Payer: Self-pay | Admitting: Neurology

## 2020-08-26 DIAGNOSIS — E039 Hypothyroidism, unspecified: Secondary | ICD-10-CM | POA: Diagnosis not present

## 2020-08-26 DIAGNOSIS — R945 Abnormal results of liver function studies: Secondary | ICD-10-CM | POA: Diagnosis not present

## 2020-08-26 DIAGNOSIS — E782 Mixed hyperlipidemia: Secondary | ICD-10-CM | POA: Diagnosis not present

## 2020-08-26 DIAGNOSIS — I10 Essential (primary) hypertension: Secondary | ICD-10-CM | POA: Diagnosis not present

## 2020-08-26 DIAGNOSIS — E1140 Type 2 diabetes mellitus with diabetic neuropathy, unspecified: Secondary | ICD-10-CM | POA: Diagnosis not present

## 2020-09-03 ENCOUNTER — Other Ambulatory Visit: Payer: Self-pay | Admitting: Obstetrics & Gynecology

## 2020-09-23 ENCOUNTER — Other Ambulatory Visit: Payer: Self-pay | Admitting: Neurology

## 2020-09-26 DIAGNOSIS — I10 Essential (primary) hypertension: Secondary | ICD-10-CM | POA: Diagnosis not present

## 2020-09-26 DIAGNOSIS — E1140 Type 2 diabetes mellitus with diabetic neuropathy, unspecified: Secondary | ICD-10-CM | POA: Diagnosis not present

## 2020-09-26 DIAGNOSIS — R945 Abnormal results of liver function studies: Secondary | ICD-10-CM | POA: Diagnosis not present

## 2020-09-26 DIAGNOSIS — E782 Mixed hyperlipidemia: Secondary | ICD-10-CM | POA: Diagnosis not present

## 2020-09-26 DIAGNOSIS — E039 Hypothyroidism, unspecified: Secondary | ICD-10-CM | POA: Diagnosis not present

## 2020-10-20 ENCOUNTER — Other Ambulatory Visit: Payer: Self-pay | Admitting: Neurology

## 2020-10-22 ENCOUNTER — Emergency Department (HOSPITAL_COMMUNITY)
Admission: EM | Admit: 2020-10-22 | Discharge: 2020-10-22 | Disposition: A | Discharge status: Home / Self Care | Payer: Medicare Other | Attending: Emergency Medicine | Admitting: Emergency Medicine

## 2020-10-22 ENCOUNTER — Encounter (HOSPITAL_COMMUNITY): Payer: Self-pay | Admitting: Emergency Medicine

## 2020-10-22 ENCOUNTER — Other Ambulatory Visit: Payer: Self-pay

## 2020-10-22 DIAGNOSIS — E1140 Type 2 diabetes mellitus with diabetic neuropathy, unspecified: Secondary | ICD-10-CM | POA: Diagnosis not present

## 2020-10-22 DIAGNOSIS — I10 Essential (primary) hypertension: Secondary | ICD-10-CM | POA: Insufficient documentation

## 2020-10-22 DIAGNOSIS — J019 Acute sinusitis, unspecified: Secondary | ICD-10-CM | POA: Insufficient documentation

## 2020-10-22 DIAGNOSIS — Z87891 Personal history of nicotine dependence: Secondary | ICD-10-CM | POA: Diagnosis not present

## 2020-10-22 DIAGNOSIS — J45909 Unspecified asthma, uncomplicated: Secondary | ICD-10-CM | POA: Diagnosis not present

## 2020-10-22 DIAGNOSIS — J029 Acute pharyngitis, unspecified: Secondary | ICD-10-CM | POA: Diagnosis present

## 2020-10-22 DIAGNOSIS — Z79899 Other long term (current) drug therapy: Secondary | ICD-10-CM | POA: Insufficient documentation

## 2020-10-22 DIAGNOSIS — Z20822 Contact with and (suspected) exposure to covid-19: Secondary | ICD-10-CM | POA: Insufficient documentation

## 2020-10-22 DIAGNOSIS — R0789 Other chest pain: Secondary | ICD-10-CM | POA: Insufficient documentation

## 2020-10-22 DIAGNOSIS — E039 Hypothyroidism, unspecified: Secondary | ICD-10-CM | POA: Insufficient documentation

## 2020-10-22 DIAGNOSIS — J069 Acute upper respiratory infection, unspecified: Secondary | ICD-10-CM | POA: Insufficient documentation

## 2020-10-22 DIAGNOSIS — Z96653 Presence of artificial knee joint, bilateral: Secondary | ICD-10-CM | POA: Diagnosis not present

## 2020-10-22 DIAGNOSIS — B9789 Other viral agents as the cause of diseases classified elsewhere: Secondary | ICD-10-CM | POA: Diagnosis not present

## 2020-10-22 LAB — RESPIRATORY PANEL BY RT PCR (FLU A&B, COVID)
Influenza A by PCR: NEGATIVE
Influenza B by PCR: NEGATIVE
SARS Coronavirus 2 by RT PCR: NEGATIVE

## 2020-10-22 LAB — GROUP A STREP BY PCR: Group A Strep by PCR: NOT DETECTED

## 2020-10-22 MED ORDER — KETOROLAC TROMETHAMINE 60 MG/2ML IM SOLN
60.0000 mg | Freq: Once | INTRAMUSCULAR | Status: AC
  Administered 2020-10-22: 60 mg via INTRAMUSCULAR
  Filled 2020-10-22: qty 2

## 2020-10-22 MED ORDER — METOCLOPRAMIDE HCL 10 MG PO TABS
10.0000 mg | ORAL_TABLET | Freq: Once | ORAL | Status: AC
  Administered 2020-10-22: 10 mg via ORAL
  Filled 2020-10-22: qty 1

## 2020-10-22 MED ORDER — ACETAMINOPHEN 500 MG PO TABS
1000.0000 mg | ORAL_TABLET | Freq: Once | ORAL | Status: AC
  Administered 2020-10-22: 1000 mg via ORAL
  Filled 2020-10-22: qty 2

## 2020-10-22 NOTE — Discharge Instructions (Addendum)
You were seen in the ED for congestion in your nose, sore throat, dry cough, wheezing, ear pain and chest tightness  Respiratory panel is negative for Covid, influenza.  Strep test is negative.  I think your symptoms are due to an upper respiratory infection/virus.  You have a lot of symptoms consistent with sinusitis.  At this time your symptoms have been present for 2 to 3 days and you do not need antibiotics.  Please use 1 spray of Flonase in each nose morning and night.  Take an over-the-counter antihistamine like Allegra, Zyrtec, Claritin once daily.  You can use Sudafed twice a day for no more than 2 to 3 days to help with sinus congestion as well.  You were given medicines here to help with your migraine.  I suspect that your upper respiratory infection/sinusitis has flared a migraine.  Continue the medicine regimen as above for the next 5 to 7 days.  You may need antibiotics if you have worsening sinus congestion, drainage, fevers.  If your symptoms are not improvement after a week you need to be reevaluated.  Return to the ED for worsening or new symptoms.

## 2020-10-22 NOTE — ED Triage Notes (Signed)
Sore throat and ear ache for 2-3 days.  Chest congestion.  Headache 10/10.

## 2020-10-22 NOTE — ED Provider Notes (Signed)
[facility] EMERGENCY DEPARTMENT Provider Note   CSN: [id number] Arrival date & time: 10/22/20  1639     History Chief Complaint  Patient presents with  . Sore Throat    LAKEISHIA MALANEY is a 64 y.o. female with history of asthma, chronic migraines, hypertension, hypothyroidism, seizures on Keppra, diabetes presents to the ED for evaluation of several symptoms including headache, nasal congestion, bilateral ear pain, sore throat, dry cough, chest tightness and wheezing.  Onset yesterday.  States she feels like she is getting a cold.  She lives with 3 grandchildren under the age of 37 but none of them are sick.  Fully vaccinated for COVID last month.  Has been taking Mucinex without relief.  Reports long history of headaches typically associated with neck pain, her headache today feels like her usual migraine.  States she does not take many over-the-counter medicines for her headaches other than ibuprofen because of her high blood pressure she was told not to take anything else.  Typically her headaches are relieved with ibuprofen.  Denies fever but reports intermittent chills and sweats.  No shortness of breath.  No nausea, vomiting, abdominal pain, diarrhea.  No loss of taste or smell.  No head trauma. No OAC. States she feels like she just has a cold but states she wants to make sure it is nothing else because she lives with all 3 grandchildren. States her one year old grandchild already had RSV many months ago and needed to be admitted to the hospital. Afraid to spread any viruses to them.   HPI     Past Medical History:  Diagnosis Date  . Asthma   . Chronic back pain   . Chronic leg pain    Bilateral  . Chronic pelvic pain in female   . Essential hypertension   . Fibromyalgia   . H/O cardiac catheterization    No significant CAD (only 20% LAD) 03/02/13 with normal LV function.  Marland Kitchen Headache   . High cholesterol   . Hypothyroidism   . Mental disorder   . OAB (overactive bladder)  05/21/2013  . Obstructive sleep apnea    Noncompliant with CPAP due to claustophobia.  . Panic attacks   . Rectocele 05/21/2013  . Seizures (HCC)    1 severe seizure 2015; has had "small ones" since including 9/18  . Type 2 diabetes mellitus [facility])     Patient Active Problem List   Diagnosis Date Noted  . Diabetic neuropathy (HCC) 12/20/2019  . Current moderate episode of major depressive disorder without prior episode (HCC) 12/20/2019  . Myalgia 12/20/2019  . SUI (stress urinary incontinence, female) 05/05/2018  . Screening for colorectal cancer 05/05/2018  . Encounter for well woman exam with routine gynecological exam 05/05/2018  . Breast pain, right 05/05/2018  . Insomnia 09/13/2017  . Restless leg 09/13/2017  . Superficial fungus infection of skin 05/04/2017  . Bruising 10/14/2016  . Chest pain 10/04/2016  . Hypothyroidism 10/04/2016  . Idiopathic intracranial hypertension 11/19/2015  . Papilledema 09/08/2015  . Left facial numbness 08/22/2015  . Epilepsy, generalized, convulsive (HCC) 06/05/2015  . Obstructive sleep apnea 06/05/2015  . Headache, occipital 06/05/2015  . Neck pain 06/05/2015  . Occipital neuralgia of right side 06/05/2015  . Seizure (HCC) 07/21/2014  . Rectocele 05/21/2013  . OAB (overactive bladder) 05/21/2013  . Difficulty in walking(719.7) 04/11/2013  . Leg weakness, bilateral 04/11/2013  . Diabetes (HCC) 02/28/2013  . Morbid obesity (HCC) 02/28/2013  . Hyperlipidemia 02/28/2013  . CHRONIC  OSTEOMYELITIS ANKLE AND FOOT 07/22/2009  . Hypertension 07/22/2009    Past Surgical History:  Procedure Laterality Date  . ABDOMINAL HYSTERECTOMY    . ANKLE SURGERY    . BACK SURGERY    . CARPAL TUNNEL RELEASE    . knee replacements     bilateral  . LEFT HEART CATHETERIZATION WITH CORONARY ANGIOGRAM N/A 03/02/2013   Procedure: LEFT HEART CATHETERIZATION WITH CORONARY ANGIOGRAM;  Surgeon: Peter M Swaziland, MD;  Location: [facility] CATH LAB;  Service: Cardiovascular;   Laterality: N/A;  . TONSILLECTOMY    . TUBAL LIGATION       OB History    Gravida  2   Para  2   Term  2   Preterm      AB      Living  2     SAB      TAB      Ectopic      Multiple      Live Births  2           Family History  Problem Relation Age of Onset  . Heart attack Mother   . Diabetes Mother   . Hypertension Mother   . Sick sinus syndrome Brother        Pacemaker  . Congenital heart disease Brother   . Stroke Brother   . Coronary artery disease Brother   . Breast cancer Maternal Aunt   . Cancer Cousin        leukemia    Social History   Tobacco Use  . Smoking status: Former Smoker    Packs/day: 1.00    Years: 2.00    Pack years: 2.00    Types: Cigarettes    Quit date: 12/13/1974    Years since quitting: 45.8  . Smokeless tobacco: Never Used  Vaping Use  . Vaping Use: Never used  Substance Use Topics  . Alcohol use: No  . Drug use: No    Home Medications Prior to Admission medications   Medication Sig Start Date End Date Taking? Authorizing Provider  atorvastatin (LIPITOR) 40 MG tablet Take 40 mg by mouth at bedtime.  10/15/14   [provider]  clobetasol (TEMOVATE) 0.05 % external solution Apply 1 application topically 2 (two) times daily as needed (to scalp for use at 1-2 weeks at one time).  04/04/18   [provider]  DULoxetine (CYMBALTA) 60 MG capsule TAKE ONE CAPSULE BY MOUTH EVERY MORNING and TAKE ONE TABLET BY MOUTH EVERY EVENING 08/25/20   Lomax, [name], NP  Fluocinolone Acetonide Scalp 0.01 % OIL Apply 1 application topically daily as needed (applied to scalp for psoriasis).     [provider]  ibuprofen (ADVIL,MOTRIN) 200 MG tablet Take 200-400 mg by mouth daily as needed for mild pain or moderate pain.     [provider]  levETIRAcetam (KEPPRA) 500 MG tablet TAKE ONE TABLET BY MOUTH AT BREAKFAST AND AT BEDTIME 10/20/20   Lomax, [name], NP  levothyroxine (SYNTHROID, LEVOTHROID) 100 MCG tablet  Take 100 mcg by mouth every morning.  12/31/16   [provider]  metFORMIN (GLUCOPHAGE) 500 MG tablet Take 500 mg by mouth 2 (two) times daily with a meal.  03/26/14   [provider]  methocarbamol (ROBAXIN) 500 MG tablet Take 1 tablet (500 mg total) by mouth 2 (two) times daily. 04/13/20   Gailen Shelter, PA  metoprolol (LOPRESSOR) 50 MG tablet Take 50 mg by mouth 2 (two) times daily.  [provider]  nystatin cream (MYCOSTATIN) Apply 1 application topically 2 (two) times daily as needed for dry skin. 10/07/19   Lazaro Arms, MD  oxybutynin (DITROPAN) 5 MG tablet TAKE ONE TABLET BY MOUTH AT [facility] AND AT BEDTIME 09/09/20   Lazaro Arms, MD  triamcinolone cream (KENALOG) 0.1 % Apply 1 application topically 2 (two) times daily as needed (for irritation). 10/07/19   Lazaro Arms, MD  zonisamide (ZONEGRAN) 100 MG capsule TAKE TWO CAPSULES BY MOUTH EVERYDAY AT BEDTIME 09/23/20   Lomax, [name], NP    Allergies    Aspirin, Codeine, and Latex  Review of Systems   Review of Systems  HENT: Positive for congestion, ear pain (pressure), sinus pain and sore throat.   Respiratory: Positive for cough, chest tightness and wheezing.   Neurological: Positive for headaches.  All other systems reviewed and are negative.   Physical Exam Updated Vital Signs BP 130/73 (BP Location: Right Arm)   Pulse (!) 51   Temp 98.4 F (36.9 C) (Oral)   Resp 20   Ht 5\' 7"  (1.702 m)   Wt 113.4 kg   SpO2 99%   BMI 39.16 kg/m   Physical Exam Vitals and nursing note reviewed.  Constitutional:      General: She is not in acute distress.    Appearance: She is well-developed.     Comments: NAD.  HENT:     Head: Normocephalic and atraumatic.     Right Ear: External ear normal. A middle ear effusion is present.     Left Ear: External ear normal. A middle ear effusion is present.     Ears:     Comments: Bilateral white middle ear effusions right greater than left.  No TM bulging,  erythema, perforation.  No mastoid tenderness.    Nose: Rhinorrhea present.     Comments: Clear rhinorrhea.  Mild nasal mucosal erythema but no significant edema.  No sinus tenderness    Mouth/Throat:     Pharynx: Posterior oropharyngeal erythema present.     Comments: Mild diffuse erythema of the uvula and oropharynx.  No significant hypertrophy of the tonsils.  No exudates, vesicular lesions, trismus.  Tolerating secretions.  Normal sublingual space.  Normal phonation.  No stridor. Eyes:     General: No scleral icterus.    Conjunctiva/sclera: Conjunctivae normal.  Neck:     Comments: No cervical lymphadenopathy.  No asymmetric A.P.L. edema or tenderness.  Trachea midline. Cardiovascular:     Rate and Rhythm: Normal rate and regular rhythm.     Heart sounds: Normal heart sounds. No murmur heard.   Pulmonary:     Effort: Pulmonary effort is normal.     Breath sounds: Normal breath sounds.  Musculoskeletal:        General: No deformity. Normal range of motion.     Cervical back: Normal range of motion and neck supple.  Skin:    General: Skin is warm and dry.     Capillary Refill: Capillary refill takes less than 2 seconds.  Neurological:     Mental Status: She is alert and oriented to person, place, and time.  Psychiatric:        Behavior: Behavior normal.        Thought Content: Thought content normal.        Judgment: Judgment normal.     ED Results / Procedures / Treatments   Labs (all labs ordered are listed, but only abnormal results are displayed) Labs Reviewed  GROUP A STREP BY PCR  RESPIRATORY PANEL BY RT PCR (FLU A&B, COVID)    EKG None  Radiology No results found.  Procedures Procedures (including critical care time)  Medications Ordered in ED Medications  ketorolac (TORADOL) injection 60 mg (60 mg Intramuscular Given 10/22/20 1826)  metoCLOPramide (REGLAN) tablet 10 mg (10 mg Oral Given 10/22/20 1826)  acetaminophen (TYLENOL) tablet 1,000 mg (1,000 mg  Oral Given 10/22/20 1826)    ED Course  I have reviewed the triage vital signs and the nursing notes.  Pertinent labs & imaging results that were available during my care of the patient were reviewed by me and considered in my medical decision making (see chart for details).    MDM Rules/Calculators/A&P                          64 year old female with history of chronic migraines, seizures here for sinus congestion, sore throat, ear pain, dry cough, wheezing and chest tightness.  History of asthma.  Reports symptoms began yesterday and headache began this morning.  On exam she is well-appearing.  Afebrile.  She has clear rhinorrhea, nasal mucosal erythema, bilateral middle ear effusion.s, oropharyngeal erythema.  Normal lung exam.  No hypoxia, tachypnea.  Respiratory panel and strep test negative.  Clinical presentation is most consistent with URI versus sinusitis likely viral given onset last 2 days.  Suspect sinus pressure has caused her to have a migraine.  She denies thunderclap, severe headache, neuro deficits and clinically is not consistent with CVA, ICH.  Will discharge with Afrin/Flonase, antihistamine, Sudafed.  No indication for antibiotics at this time. She was given IM Toradol and p.o. migraine cocktail here for headache.  Do not think further emergent lab work, imaging is necessary.  Return precautions discussed.  She is comfortable with this plan. Final Clinical Impression(s) / ED Diagnoses Final diagnoses:  Viral URI with cough  Acute non-recurrent sinusitis, unspecified location    Rx / DC Orders ED Discharge Orders    None       Liberty Handy, PA-C 10/22/20 1846    Pricilla Loveless, MD 10/24/20 0028

## 2020-10-27 DIAGNOSIS — R945 Abnormal results of liver function studies: Secondary | ICD-10-CM | POA: Diagnosis not present

## 2020-10-27 DIAGNOSIS — E1140 Type 2 diabetes mellitus with diabetic neuropathy, unspecified: Secondary | ICD-10-CM | POA: Diagnosis not present

## 2020-10-27 DIAGNOSIS — E039 Hypothyroidism, unspecified: Secondary | ICD-10-CM | POA: Diagnosis not present

## 2020-10-27 DIAGNOSIS — E782 Mixed hyperlipidemia: Secondary | ICD-10-CM | POA: Diagnosis not present

## 2020-10-27 DIAGNOSIS — I10 Essential (primary) hypertension: Secondary | ICD-10-CM | POA: Diagnosis not present

## 2020-11-12 ENCOUNTER — Emergency Department (HOSPITAL_COMMUNITY): Payer: Medicare Other

## 2020-11-12 ENCOUNTER — Other Ambulatory Visit: Payer: Self-pay

## 2020-11-12 ENCOUNTER — Encounter (HOSPITAL_COMMUNITY): Payer: Self-pay

## 2020-11-12 ENCOUNTER — Emergency Department (HOSPITAL_COMMUNITY)
Admission: EM | Admit: 2020-11-12 | Discharge: 2020-11-12 | Disposition: A | Discharge status: Home / Self Care | Payer: Medicare Other | Attending: Emergency Medicine | Admitting: Emergency Medicine

## 2020-11-12 DIAGNOSIS — Z79899 Other long term (current) drug therapy: Secondary | ICD-10-CM | POA: Insufficient documentation

## 2020-11-12 DIAGNOSIS — Z91040 Latex allergy status: Secondary | ICD-10-CM | POA: Insufficient documentation

## 2020-11-12 DIAGNOSIS — E039 Hypothyroidism, unspecified: Secondary | ICD-10-CM | POA: Insufficient documentation

## 2020-11-12 DIAGNOSIS — Z743 Need for continuous supervision: Secondary | ICD-10-CM | POA: Diagnosis not present

## 2020-11-12 DIAGNOSIS — R569 Unspecified convulsions: Secondary | ICD-10-CM

## 2020-11-12 DIAGNOSIS — Z7984 Long term (current) use of oral hypoglycemic drugs: Secondary | ICD-10-CM | POA: Diagnosis not present

## 2020-11-12 DIAGNOSIS — E1165 Type 2 diabetes mellitus with hyperglycemia: Secondary | ICD-10-CM | POA: Diagnosis not present

## 2020-11-12 DIAGNOSIS — E1140 Type 2 diabetes mellitus with diabetic neuropathy, unspecified: Secondary | ICD-10-CM | POA: Insufficient documentation

## 2020-11-12 DIAGNOSIS — R0689 Other abnormalities of breathing: Secondary | ICD-10-CM | POA: Diagnosis not present

## 2020-11-12 DIAGNOSIS — R519 Headache, unspecified: Secondary | ICD-10-CM | POA: Diagnosis not present

## 2020-11-12 DIAGNOSIS — E1169 Type 2 diabetes mellitus with other specified complication: Secondary | ICD-10-CM | POA: Insufficient documentation

## 2020-11-12 DIAGNOSIS — I10 Essential (primary) hypertension: Secondary | ICD-10-CM | POA: Diagnosis not present

## 2020-11-12 DIAGNOSIS — R42 Dizziness and giddiness: Secondary | ICD-10-CM | POA: Insufficient documentation

## 2020-11-12 DIAGNOSIS — E785 Hyperlipidemia, unspecified: Secondary | ICD-10-CM | POA: Insufficient documentation

## 2020-11-12 LAB — CBC WITH DIFFERENTIAL/PLATELET
Abs Immature Granulocytes: 0.02 10*3/uL (ref 0.00–0.07)
Basophils Absolute: 0.1 10*3/uL (ref 0.0–0.1)
Basophils Relative: 1 %
Eosinophils Absolute: 0.2 10*3/uL (ref 0.0–0.5)
Eosinophils Relative: 3 %
HCT: 42.3 % (ref 36.0–46.0)
Hemoglobin: 13.4 g/dL (ref 12.0–15.0)
Immature Granulocytes: 0 %
Lymphocytes Relative: 32 %
Lymphs Abs: 2 10*3/uL (ref 0.7–4.0)
MCH: 29.2 pg (ref 26.0–34.0)
MCHC: 31.7 g/dL (ref 30.0–36.0)
MCV: 92.2 fL (ref 80.0–100.0)
Monocytes Absolute: 0.7 10*3/uL (ref 0.1–1.0)
Monocytes Relative: 11 %
Neutro Abs: 3.5 10*3/uL (ref 1.7–7.7)
Neutrophils Relative %: 53 %
Platelets: 222 10*3/uL (ref 150–400)
RBC: 4.59 MIL/uL (ref 3.87–5.11)
RDW: 12.5 % (ref 11.5–15.5)
WBC: 6.4 10*3/uL (ref 4.0–10.5)
nRBC: 0 % (ref 0.0–0.2)

## 2020-11-12 LAB — URINALYSIS, ROUTINE W REFLEX MICROSCOPIC
Bilirubin Urine: NEGATIVE
Glucose, UA: NEGATIVE mg/dL
Hgb urine dipstick: NEGATIVE
Ketones, ur: NEGATIVE mg/dL
Leukocytes,Ua: NEGATIVE
Nitrite: NEGATIVE
Protein, ur: NEGATIVE mg/dL
Specific Gravity, Urine: 1.017 (ref 1.005–1.030)
pH: 6 (ref 5.0–8.0)

## 2020-11-12 LAB — COMPREHENSIVE METABOLIC PANEL
ALT: 22 U/L (ref 0–44)
AST: 29 U/L (ref 15–41)
Albumin: 3.9 g/dL (ref 3.5–5.0)
Alkaline Phosphatase: 71 U/L (ref 38–126)
Anion gap: 8 (ref 5–15)
BUN: 15 mg/dL (ref 8–23)
CO2: 25 mmol/L (ref 22–32)
Calcium: 9 mg/dL (ref 8.9–10.3)
Chloride: 103 mmol/L (ref 98–111)
Creatinine, Ser: 0.78 mg/dL (ref 0.44–1.00)
GFR, Estimated: >60 mL/min (ref >60)
Glucose, Bld: 93 mg/dL (ref 70–99)
Potassium: 3.9 mmol/L (ref 3.5–5.1)
Sodium: 136 mmol/L (ref 135–145)
Total Bilirubin: 0.5 mg/dL (ref 0.3–1.2)
Total Protein: 6.8 g/dL (ref 6.5–8.1)

## 2020-11-12 LAB — MAGNESIUM: Magnesium: 2.2 mg/dL (ref 1.7–2.4)

## 2020-11-12 MED ORDER — LEVETIRACETAM IN NACL 500 MG/100ML IV SOLN
500.0000 mg | Freq: Once | INTRAVENOUS | Status: AC
  Administered 2020-11-12: 500 mg via INTRAVENOUS
  Filled 2020-11-12: qty 100

## 2020-11-12 MED ORDER — DIPHENHYDRAMINE HCL 25 MG PO CAPS
25.0000 mg | ORAL_CAPSULE | Freq: Once | ORAL | Status: AC
  Administered 2020-11-12: 25 mg via ORAL
  Filled 2020-11-12: qty 1

## 2020-11-12 MED ORDER — KETOROLAC TROMETHAMINE 15 MG/ML IJ SOLN
15.0000 mg | Freq: Once | INTRAMUSCULAR | Status: AC
  Administered 2020-11-12: 15 mg via INTRAVENOUS
  Filled 2020-11-12: qty 1

## 2020-11-12 MED ORDER — METOCLOPRAMIDE HCL 5 MG/ML IJ SOLN
10.0000 mg | Freq: Once | INTRAMUSCULAR | Status: AC
  Administered 2020-11-12: 10 mg via INTRAVENOUS
  Filled 2020-11-12: qty 2

## 2020-11-12 NOTE — ED Triage Notes (Signed)
Pt brought to ED via [facility] for possible seizure. Per family, pt was sitting in chair, started shaking and slid out of the chair. Pt with hx seizures. Pt denies missing seizure meds. Pt alert and oriented x 4.

## 2020-11-12 NOTE — ED Provider Notes (Signed)
[facility] EMERGENCY DEPARTMENT Provider Note   CSN: [id number] Arrival date & time: 11/12/20  1727     History Chief Complaint  Patient presents with  . Seizures    [name] is a 64 y.o. female presenting for evaluation of seizure.  Patient states last remembers arguing with her family. The next thing she knows, EMS was with her.  She reports she did lose control of her bladder.  This is consistent with her normal seizures.  She does have a history of seizures, takes Keppra for this.  Last dose was this morning.  She has not missed any doses.  She follows with [facility] neurology.  Last seizure was several years ago, induced by stress and arguing with her family.  She denies recent fevers, chills, cough, chest pain, shortness of breath, nausea, vomiting abdominal pain, urinary symptoms, normal bowel movements.  She denies recent change in medications.  Patient states earlier today she did have a headache, feels different than her typical headache.  She continues to have this diffuse head pain as well as a dizziness that she describes as seeing the room spinning.  She has not taken anything for this.  She had normal p.o. intake today.  Additional history obtained from chart review.  Patient with a history of chronic pain, hypertension, fibromyalgia, hypothyroidism, OAB, panic attacks, seizures, diabetes  HPI     Past Medical History:  Diagnosis Date  . Asthma   . Chronic back pain   . Chronic leg pain    Bilateral  . Chronic pelvic pain in female   . Essential hypertension   . Fibromyalgia   . H/O cardiac catheterization    No significant CAD (only 20% LAD) 03/02/13 with normal LV function.  Marland Kitchen Headache   . High cholesterol   . Hypothyroidism   . Mental disorder   . OAB (overactive bladder) 05/21/2013  . Obstructive sleep apnea    Noncompliant with CPAP due to claustophobia.  . Panic attacks   . Rectocele 05/21/2013  . Seizures (HCC)    1 severe seizure 2015; has had  "small ones" since including 9/18  . Type 2 diabetes mellitus [facility])     Patient Active Problem List   Diagnosis Date Noted  . Diabetic neuropathy (HCC) 12/20/2019  . Current moderate episode of major depressive disorder without prior episode (HCC) 12/20/2019  . Myalgia 12/20/2019  . SUI (stress urinary incontinence, female) 05/05/2018  . Screening for colorectal cancer 05/05/2018  . Encounter for well woman exam with routine gynecological exam 05/05/2018  . Breast pain, right 05/05/2018  . Insomnia 09/13/2017  . Restless leg 09/13/2017  . Superficial fungus infection of skin 05/04/2017  . Bruising 10/14/2016  . Chest pain 10/04/2016  . Hypothyroidism 10/04/2016  . Idiopathic intracranial hypertension 11/19/2015  . Papilledema 09/08/2015  . Left facial numbness 08/22/2015  . Epilepsy, generalized, convulsive (HCC) 06/05/2015  . Obstructive sleep apnea 06/05/2015  . Headache, occipital 06/05/2015  . Neck pain 06/05/2015  . Occipital neuralgia of right side 06/05/2015  . Seizure (HCC) 07/21/2014  . Rectocele 05/21/2013  . OAB (overactive bladder) 05/21/2013  . Difficulty in walking(719.7) 04/11/2013  . Leg weakness, bilateral 04/11/2013  . Diabetes (HCC) 02/28/2013  . Morbid obesity (HCC) 02/28/2013  . Hyperlipidemia 02/28/2013  . CHRONIC OSTEOMYELITIS ANKLE AND FOOT 07/22/2009  . Hypertension 07/22/2009    Past Surgical History:  Procedure Laterality Date  . ABDOMINAL HYSTERECTOMY    . ANKLE SURGERY    . BACK SURGERY    .  CARPAL TUNNEL RELEASE    . knee replacements     bilateral  . LEFT HEART CATHETERIZATION WITH CORONARY ANGIOGRAM N/A 03/02/2013   Procedure: LEFT HEART CATHETERIZATION WITH CORONARY ANGIOGRAM;  Surgeon: Peter M Swaziland, MD;  Location: [facility] CATH LAB;  Service: Cardiovascular;  Laterality: N/A;  . TONSILLECTOMY    . TUBAL LIGATION       OB History    Gravida  2   Para  2   Term  2   Preterm      AB      Living  2     SAB      TAB       Ectopic      Multiple      Live Births  2           Family History  Problem Relation Age of Onset  . Heart attack Mother   . Diabetes Mother   . Hypertension Mother   . Sick sinus syndrome Brother        Pacemaker  . Congenital heart disease Brother   . Stroke Brother   . Coronary artery disease Brother   . Breast cancer Maternal Aunt   . Cancer Cousin        leukemia    Social History   Tobacco Use  . Smoking status: Former Smoker    Packs/day: 1.00    Years: 2.00    Pack years: 2.00    Types: Cigarettes    Quit date: 12/13/1974    Years since quitting: 45.9  . Smokeless tobacco: Never Used  Vaping Use  . Vaping Use: Never used  Substance Use Topics  . Alcohol use: No  . Drug use: No    Home Medications Prior to Admission medications   Medication Sig Start Date End Date Taking? Authorizing Provider  Ascorbic Acid (VITAMIN C GUMMIES PO) Take 1 tablet by mouth daily.   Yes [provider]  atorvastatin (LIPITOR) 40 MG tablet Take 40 mg by mouth at bedtime.  10/15/14  Yes [provider]  clobetasol (TEMOVATE) 0.05 % external solution Apply 1 application topically 2 (two) times daily as needed (to scalp for use at 1-2 weeks at one time).  04/04/18  Yes [provider]  DULoxetine (CYMBALTA) 60 MG capsule TAKE ONE CAPSULE BY MOUTH EVERY MORNING and TAKE ONE TABLET BY MOUTH EVERY EVENING 08/25/20  Yes Lomax, [name], NP  Fluocinolone Acetonide Scalp 0.01 % OIL Apply 1 application topically daily as needed (applied to scalp for psoriasis).    Yes [provider]  ibuprofen (ADVIL,MOTRIN) 200 MG tablet Take 200-400 mg by mouth daily as needed for mild pain or moderate pain.    Yes [provider]  levETIRAcetam (KEPPRA) 500 MG tablet TAKE ONE TABLET BY MOUTH AT BREAKFAST AND AT BEDTIME 10/20/20  Yes Lomax, [name], NP  levothyroxine (SYNTHROID, LEVOTHROID) 100 MCG tablet Take 100 mcg by mouth every morning.  12/31/16  Yes [provider]  metFORMIN (GLUCOPHAGE) 500 MG tablet Take 500 mg by mouth 2 (two) times daily with a meal.  03/26/14  Yes [provider]  metoprolol (LOPRESSOR) 50 MG tablet Take 50 mg by mouth 2 (two) times daily.     Yes [provider]  nystatin cream (MYCOSTATIN) Apply 1 application topically 2 (two) times daily as needed for dry skin. 10/07/19  Yes Lazaro Arms, MD  oxybutynin (DITROPAN) 5 MG tablet TAKE ONE TABLET BY MOUTH AT [facility] AND  AT BEDTIME 09/09/20  Yes Lazaro Arms, MD  triamcinolone cream (KENALOG) 0.1 % Apply 1 application topically 2 (two) times daily as needed (for irritation). 10/07/19  Yes Lazaro Arms, MD  vitamin B-12 (CYANOCOBALAMIN) 100 MCG tablet Take 100 mcg by mouth daily.   Yes [provider]  zonisamide (ZONEGRAN) 100 MG capsule TAKE TWO CAPSULES BY MOUTH EVERYDAY AT BEDTIME 09/23/20  Yes Lomax, [name], NP  methocarbamol (ROBAXIN) 500 MG tablet Take 1 tablet (500 mg total) by mouth 2 (two) times daily. Patient not taking: Reported on 11/12/2020 04/13/20   Gailen Shelter, PA    Allergies    Aspirin, Codeine, and Latex  Review of Systems   Review of Systems  Neurological: Positive for dizziness, seizures and headaches.  All other systems reviewed and are negative.   Physical Exam Updated Vital Signs BP 129/64   Pulse (!) 57   Temp 98.7 F (37.1 C) (Oral)   Resp 16   Ht 5\' 7"  (1.702 m)   Wt 113.4 kg   SpO2 96%   BMI 39.16 kg/m   Physical Exam Vitals and nursing note reviewed.  Constitutional:      General: She is not in acute distress.    Appearance: She is well-developed.     Comments: Appears nontoxic  HENT:     Head: Normocephalic and atraumatic.  Eyes:     Extraocular Movements: Extraocular movements intact.     Conjunctiva/sclera: Conjunctivae normal.     Pupils: Pupils are equal, round, and reactive to light.  Cardiovascular:     Rate and Rhythm: Normal rate and regular rhythm.     Pulses: Normal pulses.   Pulmonary:     Effort: Pulmonary effort is normal. No respiratory distress.     Breath sounds: Normal breath sounds. No wheezing.  Abdominal:     General: There is no distension.     Palpations: Abdomen is soft. There is no mass.     Tenderness: There is no abdominal tenderness. There is no guarding or rebound.  Musculoskeletal:        General: Normal range of motion.     Cervical back: Normal range of motion and neck supple.  Skin:    General: Skin is warm and dry.     Capillary Refill: Capillary refill takes less than 2 seconds.  Neurological:     Mental Status: She is alert and oriented to person, place, and time.     GCS: GCS eye subscore is 4. GCS verbal subscore is 5. GCS motor subscore is 6.     Comments: Strength and sensation intact x4.  Negative pronator drift.  Nose to finger intact.  Fine movement and coordination intact. Patient reports slightly decreased sensation of the entire right side of the face.  No droop is noted or facial asymmetry.     ED Results / Procedures / Treatments   Labs (all labs ordered are listed, but only abnormal results are displayed) Labs Reviewed  CBC WITH DIFFERENTIAL/PLATELET  COMPREHENSIVE METABOLIC PANEL  MAGNESIUM  URINALYSIS, ROUTINE W REFLEX MICROSCOPIC    EKG None  Radiology No results found.  Procedures Procedures (including critical care time)  Medications Ordered in ED Medications  levETIRAcetam (KEPPRA) IVPB 500 mg/100 mL premix (500 mg Intravenous New Bag/Given 11/12/20 1843)    ED Course  I have reviewed the triage vital signs and the nursing notes.  Pertinent labs & imaging results that were available during my care of the patient were reviewed  by me and considered in my medical decision making (see chart for details).    MDM Rules/Calculators/A&P                          Patient presenting for evaluation after seizure.  On exam, patient appears nontoxic.  No objective neurologic deficits, she does report  decrease sensation of the right side of her face.  Does not know when this began.  In the setting of associated headache, favor complex migraine.  However as she did have a seizure, will obtain a CT head and labs to ensure no abnormality.  Will continue to monitor.  On reassessment after headache cocktail, headache, dizziness, and decreased sensation on one side of face have resolved.  As such, likely complex migraine.  CT head negative for acute findings.  Labs pending.  Pt signed out to Lajuana Carry, PA-C for f/u on labs. If reassuring, plan for d/c.   Final Clinical Impression(s) / ED Diagnoses Final diagnoses:  None    Rx / DC Orders ED Discharge Orders    None       Alveria Apley, PA-C 11/12/20 2047    Sabas Sous, MD 11/12/20 2135

## 2020-11-12 NOTE — Discharge Instructions (Addendum)
Continue taking her medications as prescribed, including her Keppra. Follow-up with the neurologist for recheck. You will need to follow seizure precautions currently set at 6 months or until cleared by neurology.  This includes no driving, swimming, or participating in any activity that could cause from to self or others if you have a seizure. Return to the emergency room if you have recurrent seizures, fever, severe worsening headache, any new, worsening, or concerning symptoms.

## 2020-11-20 ENCOUNTER — Ambulatory Visit (INDEPENDENT_AMBULATORY_CARE_PROVIDER_SITE_OTHER): Payer: Medicare Other | Admitting: Neurology

## 2020-11-20 ENCOUNTER — Encounter: Payer: Self-pay | Admitting: Neurology

## 2020-11-20 ENCOUNTER — Telehealth: Payer: Self-pay | Admitting: *Deleted

## 2020-11-20 VITALS — BP 121/78 | HR 61 | Ht 67.0 in | Wt 237.0 lb

## 2020-11-20 DIAGNOSIS — R519 Headache, unspecified: Secondary | ICD-10-CM

## 2020-11-20 DIAGNOSIS — G43709 Chronic migraine without aura, not intractable, without status migrainosus: Secondary | ICD-10-CM | POA: Diagnosis not present

## 2020-11-20 DIAGNOSIS — R569 Unspecified convulsions: Secondary | ICD-10-CM | POA: Diagnosis not present

## 2020-11-20 DIAGNOSIS — M542 Cervicalgia: Secondary | ICD-10-CM

## 2020-11-20 DIAGNOSIS — G4733 Obstructive sleep apnea (adult) (pediatric): Secondary | ICD-10-CM

## 2020-11-20 DIAGNOSIS — E1142 Type 2 diabetes mellitus with diabetic polyneuropathy: Secondary | ICD-10-CM

## 2020-11-20 MED ORDER — ZONISAMIDE 100 MG PO CAPS
300.0000 mg | ORAL_CAPSULE | Freq: Every day | ORAL | 12 refills | Status: DC
Start: 2020-11-20 — End: 2021-11-26

## 2020-11-20 MED ORDER — AJOVY 225 MG/1.5ML ~~LOC~~ SOSY: 1.0000 | PREFILLED_SYRINGE | SUBCUTANEOUS | 4 refills | Status: DC

## 2020-11-20 MED ORDER — EMGALITY 120 MG/ML ~~LOC~~ SOAJ: 1.0000 "pen " | SUBCUTANEOUS | 11 refills | Status: DC

## 2020-11-20 NOTE — Addendum Note (Signed)
Addended by: Arther Abbott on: 11/20/2020 04:52 PM   Modules accepted: Orders

## 2020-11-20 NOTE — Telephone Encounter (Addendum)
Submitted PA Ajovy on CMM. Key: [id number] - PA Case ID: [id number] - Rx #: [id number]. Waiting on determination from [employer] Part D.

## 2020-11-20 NOTE — Progress Notes (Signed)
[facility]  PATIENT: [name] DOB: 1955-12-28  REFERRING DOCTOR OR PCP:  Shelva Majestic SOURCE: patient and records form EMR and Dr. [name]  _________________________________   HISTORICAL  CHIEF COMPLAINT:  Chief Complaint  Patient presents with  . Follow-up    RM 12, alone. Last seen 05/19/20 by AL,NP. On keppra and zonegran for seizures. Had a seizure a couple weeks ago. Went to the hospital [facility]).   OSA- sleep test was cx by sleep lab d/t being unable to contact pt. Pt states she was never contacted.     HISTORY OF PRESENT ILLNESS:  [name] is a 64 year old woman with a h/o a generalized tonic-clonic seizure in 2016 and possible other seizures who also has neck pain,  headaches and obstructive sleep apnea.  Update 11/20/2020: She has not had any definite seizure but had a spell with altered awareness and went to the hospital. She had incontinence but no GTC. She was arguing with family t the time. .    She thinks she tolerates the medications well.     She has some headache 28/30 days a month and many have migrainous features.  She has more headacehh pain, mostly on the top of the head and the occiput.   Holding head up and bright lights worsen the pain.   Sleep helps.   [facility] rooms and staying still helps.    NSAIDs don't help much and pain returns.   She has had the headache x 1 week.  In the past, ONB/TPI only helped that day.  She takes zonisamide 200 mg nightly and Keppra 500 mg po bid for HA and seizure  In 2018, MRI cervical showed DJD at C6-C7 without spinal stenosis but there was no nerve root compression.   Splenius capitus TPI/GON injection did not help  - She notes improved depression until HA returned.  She has a lot of family stress (takes care of 2 great grand-kids and a foster child and granddaughter lives with her).     She also has OSA and uses CPAP nightly.   She does not sleep well as her granddaughter works evenings until  1-2 am and she needs to get her 61 yo foster child to school early in the morning.    She has diabetes and notes some numbness in her toes.  This is less troublesome than her other issues.  Seizure history In August 2015, she had generalized tonic-clonic seizure activity that was witnessed by her husband. She had no prior seizures and  An EEG was performed and showed 2 runs of generalized spike wave activity, consistent with a generalized seizure disorder. She is unaware of any family history of seizures.    She has not had head trauma in the past. She had a normal childhood development. She has not had any meningitis or encephalitis.  Pseudotumor cerebri/headache and neck pain:   Due to headaches and papilledema, she underwent a LP.    CSF pressure was elevated At 26.5 cm.. She reports that her ophthalmologist told her that the optic nerve changes were due to cataracts.  Obstructive sleep apnea: She was diagnosed with OSA around 2015 with an overnight sleep study. She was started on CPAP +6 cm in the past.    However, she had trouble tolerating the mask and stopped.   She is claustophobic so seldom made it through the night with CPAP.    REVIEW OF SYSTEMS: Constitutional: No fevers, chills, sweats, or change in  appetite.   She notes some fatigue and sleepiness. Eyes: cataracts.    Improved blurriness Ear, nose and throat: No hearing loss, ear pain, nasal congestion, sore throat Cardiovascular: No chest pain, palpitations Respiratory: No shortness of breath at rest or with exertion.   Some cough and wheezes GastrointestinaI: No nausea, vomiting, diarrhea, abdominal pain, fecal incontinence Genitourinary: No dysuria, urinary retention or frequency.  No nocturia. Musculoskeletal: No neck pain, back pain Integumentary: No rash, pruritus, skin lesions Neurological: as above Psychiatric: No depression at this time.  No anxiety.  Notes increased stress at times. Endocrine: No palpitations,  diaphoresis, change in appetite, change in weigh or increased thirst Hematologic/Lymphatic: No anemia, purpura, petechiae. Allergic/Immunologic: No itchy/runny eyes, nasal congestion, recent allergic reactions, rashes  ALLERGIES: Allergies  Allergen Reactions  . Aspirin Nausea And Vomiting  . Codeine Rash  . Latex Rash    HOME MEDICATIONS:  Current Outpatient Medications:  .  Ascorbic Acid (VITAMIN C GUMMIES PO), Take 1 tablet by mouth daily., Disp: , Rfl:  .  atorvastatin (LIPITOR) 40 MG tablet, Take 40 mg by mouth at bedtime. , Disp: , Rfl:  .  clobetasol (TEMOVATE) 0.05 % external solution, Apply 1 application topically 2 (two) times daily as needed (to scalp for use at 1-2 weeks at one time). , Disp: , Rfl: 2 .  DULoxetine (CYMBALTA) 60 MG capsule, TAKE ONE CAPSULE BY MOUTH EVERY MORNING and TAKE ONE TABLET BY MOUTH EVERY EVENING (Patient taking differently: 60 mg 2 (two) times daily. Take 1 capsule by mouth every morning and 1 capsule every evening for depression), Disp: 60 capsule, Rfl: 11 .  Fluocinolone Acetonide Scalp 0.01 % OIL, Apply 1 application topically daily as needed (applied to scalp for psoriasis). , Disp: , Rfl:  .  ibuprofen (ADVIL,MOTRIN) 200 MG tablet, Take 200-400 mg by mouth daily as needed for mild pain or moderate pain. , Disp: , Rfl:  .  levETIRAcetam (KEPPRA) 500 MG tablet, TAKE ONE TABLET BY MOUTH AT BREAKFAST AND AT BEDTIME (Patient taking differently: Take 500 mg by mouth 2 (two) times daily.), Disp: 60 tablet, Rfl: 5 .  levothyroxine (SYNTHROID, LEVOTHROID) 100 MCG tablet, Take 100 mcg by mouth every morning. , Disp: , Rfl:  .  metFORMIN (GLUCOPHAGE) 500 MG tablet, Take 500 mg by mouth 2 (two) times daily with a meal. , Disp: , Rfl:  .  methocarbamol (ROBAXIN) 500 MG tablet, Take 1 tablet (500 mg total) by mouth 2 (two) times daily., Disp: 20 tablet, Rfl: 0 .  metoprolol (LOPRESSOR) 50 MG tablet, Take 50 mg by mouth 2 (two) times daily., Disp: , Rfl:  .   nystatin cream (MYCOSTATIN), Apply 1 application topically 2 (two) times daily as needed for dry skin., Disp: 30 g, Rfl: 11 .  oxybutynin (DITROPAN) 5 MG tablet, TAKE ONE TABLET BY MOUTH AT BREAKFAST AND AT BEDTIME, Disp: 60 tablet, Rfl: 11 .  triamcinolone cream (KENALOG) 0.1 %, Apply 1 application topically 2 (two) times daily as needed (for irritation)., Disp: 30 g, Rfl: 11 .  vitamin B-12 (CYANOCOBALAMIN) 100 MCG tablet, Take 100 mcg by mouth daily., Disp: , Rfl:  .  Fremanezumab-vfrm (AJOVY) 225 MG/1.5ML SOSY, Inject 1 Syringe into the skin every 28 (twenty-eight) days., Disp: 4.5 mL, Rfl: 4 .  zonisamide (ZONEGRAN) 100 MG capsule, Take 3 capsules (300 mg total) by mouth at bedtime., Disp: 90 capsule, Rfl: 12  PAST MEDICAL HISTORY: Past Medical History:  Diagnosis Date  . Asthma   . Chronic  back pain   . Chronic leg pain    Bilateral  . Chronic pelvic pain in female   . Essential hypertension   . Fibromyalgia   . H/O cardiac catheterization    No significant CAD (only 20% LAD) 03/02/13 with normal LV function.  Marland Kitchen Headache   . High cholesterol   . Hypothyroidism   . Mental disorder   . OAB (overactive bladder) 05/21/2013  . Obstructive sleep apnea    Noncompliant with CPAP due to claustophobia.  . Panic attacks   . Rectocele 05/21/2013  . Seizures (HCC)    1 severe seizure 2015; has had "small ones" since including 9/18  . Type 2 diabetes mellitus (HCC)     PAST SURGICAL HISTORY: Past Surgical History:  Procedure Laterality Date  . ABDOMINAL HYSTERECTOMY    . ANKLE SURGERY    . BACK SURGERY    . CARPAL TUNNEL RELEASE    . knee replacements     bilateral  . LEFT HEART CATHETERIZATION WITH CORONARY ANGIOGRAM N/A 03/02/2013   Procedure: LEFT HEART CATHETERIZATION WITH CORONARY ANGIOGRAM;  Surgeon: Peter M Swaziland, MD;  [facility];  Service: Cardiovascular;  Laterality: N/A;  . TONSILLECTOMY    . TUBAL LIGATION      FAMILY HISTORY: Family History  Problem  Relation Age of Onset  . Heart attack Mother   . Diabetes Mother   . Hypertension Mother   . Sick sinus syndrome Brother        Pacemaker  . Congenital heart disease Brother   . Stroke Brother   . Coronary artery disease Brother   . Breast cancer Maternal Aunt   . Cancer Cousin        leukemia    SOCIAL HISTORY:  Social History   Socioeconomic History  . Marital status: Married    Spouse name: Not on file  . Number of children: Not on file  . Years of education: Not on file  . Highest education level: Not on file  Occupational History  . Occupation: disabled  Tobacco Use  . Smoking status: Former Smoker    Packs/day: 1.00    Years: 2.00    Pack years: 2.00    Types: Cigarettes    Quit date: 12/13/1974    Years since quitting: 45.9  . Smokeless tobacco: Never Used  Vaping Use  . Vaping Use: Never used  Substance and Sexual Activity  . Alcohol use: No  . Drug use: No  . Sexual activity: Not Currently    Birth control/protection: Surgical    Comment: hyst  Other Topics Concern  . Not on file  Social History Narrative  . Not on file   Social Determinants of Health   Financial Resource Strain: Not on file  Food Insecurity: Not on file  Transportation Needs: Not on file  Physical Activity: Not on file  Stress: Not on file  Social Connections: Not on file  Intimate Partner Violence: Not on file     PHYSICAL EXAM  Vitals:   11/20/20 0745  BP: 121/78  Pulse: 61  SpO2: 97%  Weight: 237 lb (107.5 kg)  Height:  (1.702 m)    Body mass index is 37.12 kg/m.   General: The patient is an obese woman in no acute distress   Neck/Ext: The neck is supple  She has left > right occipital tenderness over splenius capitus muscle.   Also tender over left subacromial bursa.  Reduced ROM in neck and left  shoulder.     Neurologic Exam  Mental status: The patient is alert and oriented x 3 at the time of the examination. The patient has apparent normal recent and  remote memory, with an apparently normal attention span and concentration ability.   Speech is normal.  Cranial nerves: Extraocular movements are full.  Facial strength and sensation is normal. Trapezius strength is normal.    No obvious hearing deficits are noted.  Motor:  Muscle bulk is normal.   Tone is normal. Strength is  5 / 5 in all 4 extremities.   Sensory: Sensory testing is intact to touch and vibration in the hands.  She has reduced in her toes but normal at ankles.  Coordination: Cerebellar testing reveals good finger-nose-finger and heel-to-shin bilaterally.  Gait and station: Station is normal.  Gait and tandem gait are mildly wide.  Romberg is negative.  Reflexes: Deep tendon reflexes are symmetric and normal bilaterally in arms and knees and absent ankle DTR.       DIAGNOSTIC DATA (LABS, IMAGING, TESTING) - I reviewed patient records, labs, notes, testing and imaging myself where available.  Lab Results  Component Value Date   WBC 6.4 11/12/2020   HGB 13.4 11/12/2020   HCT 42.3 11/12/2020   MCV 92.2 11/12/2020   PLT 222 11/12/2020      Component Value Date/Time   NA 136 11/12/2020 2018   K 3.9 11/12/2020 2018   CL 103 11/12/2020 2018   CO2 25 11/12/2020 2018   GLUCOSE 93 11/12/2020 2018   BUN 15 11/12/2020 2018   CREATININE 0.78 11/12/2020 2018   CALCIUM 9.0 11/12/2020 2018   PROT 6.8 11/12/2020 2018   ALBUMIN 3.9 11/12/2020 2018   AST 29 11/12/2020 2018   ALT 22 11/12/2020 2018   ALKPHOS 71 11/12/2020 2018   BILITOT 0.5 11/12/2020 2018   GFRNONAA >60 11/12/2020 2018   GFRAA >60 09/06/2019 0027   Lab Results  Component Value Date   CHOL 119 09/05/2017   HDL 40 (L) 09/05/2017   LDLCALC 65 09/05/2017   TRIG 69 09/05/2017   CHOLHDL 3.0 09/05/2017   Lab Results  Component Value Date   HGBA1C 5.3 09/04/2017      ASSESSMENT AND PLAN   1. Seizure (HCC)   2. Chronic migraine w/o aura, not intractable, w/o stat migr   3. OSA (obstructive sleep  apnea)   4. Headache, occipital   5. Neck pain   6. Diabetic polyneuropathy associated with type 2 diabetes mellitus (HCC)     1.   Continue Keppra 500 mg p.o. twice daily and zonisamide increased to 300 mg nightly for seizures and headaches 2.   Cymbalta 60 mg for mood and pain.   If not better she should see psychiatry/mental health.    3    Try to wear CPAP nightly.  4.   Ajovy for chronic migraine.  She prefers not to try another TPI 5.   She will return to see me in 6 months or sooner if there are new or worsening neurologic symptoms.  45-minute office visit with the majority of the time spent face-to-face for history and physical, discussion/counseling and decision-making.  Additional time with record review and documentation.  Elder Davidian A. Epimenio Foot, MD, PhD 11/20/2020, 1:14 PM Certified in Neurology, Clinical Neurophysiology, Sleep Medicine, Pain Medicine and Neuroimaging  [facility] [address], [address] [place], [place] [address] [phone]

## 2020-11-20 NOTE — Telephone Encounter (Addendum)
Received fax from [facility] that PA denied. Pt must try/fail emgality first. Per Dr. Epimenio Foot, ok to cx Ajovy and call in emgality instead. I called and left detailed message for pt letting her know about denial and switch of medication. Advised per Dr. Epimenio Foot that she can take Ajovy sample if she has not already and then next month start with emagality. We have sent in new rx to pharmacy for her. Advised her to call back if she has any further questions/concerns.

## 2020-11-24 NOTE — Telephone Encounter (Signed)
Submitted PA emgality on CMM. KeyCynda Familia - PA Case ID: [id number] - Rx #: [id number]. Waiting on determination from [employer] Medicare Part D.

## 2020-11-24 NOTE — Telephone Encounter (Signed)
Request Reference Number: [id number]. EMGALITY INJ 120MG /ML is approved through 12/12/2021. Your patient may now fill this prescription and it will be covered.

## 2020-11-30 ENCOUNTER — Emergency Department (HOSPITAL_COMMUNITY)
Admission: EM | Admit: 2020-11-30 | Discharge: 2020-12-01 | Disposition: A | Discharge status: Home / Self Care | Payer: Medicare Other | Attending: Emergency Medicine | Admitting: Emergency Medicine

## 2020-11-30 ENCOUNTER — Encounter (HOSPITAL_COMMUNITY): Payer: Self-pay | Admitting: Emergency Medicine

## 2020-11-30 ENCOUNTER — Other Ambulatory Visit: Payer: Self-pay

## 2020-11-30 DIAGNOSIS — E039 Hypothyroidism, unspecified: Secondary | ICD-10-CM | POA: Insufficient documentation

## 2020-11-30 DIAGNOSIS — Z91040 Latex allergy status: Secondary | ICD-10-CM | POA: Insufficient documentation

## 2020-11-30 DIAGNOSIS — E1140 Type 2 diabetes mellitus with diabetic neuropathy, unspecified: Secondary | ICD-10-CM | POA: Diagnosis not present

## 2020-11-30 DIAGNOSIS — H00015 Hordeolum externum left lower eyelid: Secondary | ICD-10-CM | POA: Diagnosis not present

## 2020-11-30 DIAGNOSIS — H5711 Ocular pain, right eye: Secondary | ICD-10-CM | POA: Insufficient documentation

## 2020-11-30 DIAGNOSIS — Z79899 Other long term (current) drug therapy: Secondary | ICD-10-CM | POA: Insufficient documentation

## 2020-11-30 DIAGNOSIS — I10 Essential (primary) hypertension: Secondary | ICD-10-CM | POA: Diagnosis not present

## 2020-11-30 DIAGNOSIS — J45909 Unspecified asthma, uncomplicated: Secondary | ICD-10-CM | POA: Insufficient documentation

## 2020-11-30 DIAGNOSIS — Z7984 Long term (current) use of oral hypoglycemic drugs: Secondary | ICD-10-CM | POA: Diagnosis not present

## 2020-11-30 DIAGNOSIS — H00025 Hordeolum internum left lower eyelid: Secondary | ICD-10-CM | POA: Diagnosis not present

## 2020-11-30 DIAGNOSIS — Z87891 Personal history of nicotine dependence: Secondary | ICD-10-CM | POA: Diagnosis not present

## 2020-11-30 DIAGNOSIS — H5712 Ocular pain, left eye: Secondary | ICD-10-CM | POA: Diagnosis present

## 2020-11-30 NOTE — ED Triage Notes (Signed)
Pt c/o bilateral eye pain that began when she woke up this morning.  Pt denies numbness and tingling, weakness from one side otr the other and has no facial droop.

## 2020-12-01 DIAGNOSIS — H00015 Hordeolum externum left lower eyelid: Secondary | ICD-10-CM | POA: Diagnosis not present

## 2020-12-01 LAB — CBG MONITORING, ED: Glucose-Capillary: 72 mg/dL (ref 70–99)

## 2020-12-01 MED ORDER — TOBRAMYCIN 0.3 % OP SOLN
2.0000 [drp] | OPHTHALMIC | Status: DC
  Administered 2020-12-01: 01:00:00 2 [drp] via OPHTHALMIC
  Filled 2020-12-01: qty 5

## 2020-12-01 NOTE — ED Provider Notes (Signed)
[facility] EMERGENCY DEPARTMENT Provider Note   CSN: [id number] Arrival date & time: 11/30/20  1834   Time seen 11:24 PM  History Chief Complaint  Patient presents with  . Eye Pain    [name] is a 64 y.o. female.  HPI   Patient states she woke up this morning with pain in both her eyes, left worse than the right. She states there is some blurred vision on the left and she describes the pain is sharp and jabbing pain. She states it is localized in the corner on the medial aspect of her eyelids. The left is worse than the right. She denies itching and she feels like her eyes draining but it is not. She denies fever, nausea, vomiting, or headache. Patient has diabetes and and she is followed at [facility]. She states she wears glasses for reading. She states she has 5 children under the age of 31 in her household.  PCP Benita Stabile, MD   Past Medical History:  Diagnosis Date  . Asthma   . Chronic back pain   . Chronic leg pain    Bilateral  . Chronic pelvic pain in female   . Essential hypertension   . Fibromyalgia   . H/O cardiac catheterization    No significant CAD (only 20% LAD) 03/02/13 with normal LV function.  Marland Kitchen Headache   . High cholesterol   . Hypothyroidism   . Mental disorder   . OAB (overactive bladder) 05/21/2013  . Obstructive sleep apnea    Noncompliant with CPAP due to claustophobia.  . Panic attacks   . Rectocele 05/21/2013  . Seizures (HCC)    1 severe seizure 2015; has had "small ones" since including 9/18  . Type 2 diabetes mellitus [facility])     Patient Active Problem List   Diagnosis Date Noted  . Chronic migraine w/o aura, not intractable, w/o stat migr 11/20/2020  . Diabetic neuropathy (HCC) 12/20/2019  . Current moderate episode of major depressive disorder without prior episode (HCC) 12/20/2019  . Myalgia 12/20/2019  . SUI (stress urinary incontinence, female) 05/05/2018  . Screening for colorectal cancer 05/05/2018  . Encounter for  well woman exam with routine gynecological exam 05/05/2018  . Breast pain, right 05/05/2018  . Insomnia 09/13/2017  . Restless leg 09/13/2017  . Superficial fungus infection of skin 05/04/2017  . Bruising 10/14/2016  . Chest pain 10/04/2016  . Hypothyroidism 10/04/2016  . Idiopathic intracranial hypertension 11/19/2015  . Papilledema 09/08/2015  . Left facial numbness 08/22/2015  . Epilepsy, generalized, convulsive (HCC) 06/05/2015  . Obstructive sleep apnea 06/05/2015  . Headache, occipital 06/05/2015  . Neck pain 06/05/2015  . Occipital neuralgia of right side 06/05/2015  . Seizure (HCC) 07/21/2014  . Rectocele 05/21/2013  . OAB (overactive bladder) 05/21/2013  . Difficulty in walking(719.7) 04/11/2013  . Leg weakness, bilateral 04/11/2013  . Diabetes (HCC) 02/28/2013  . Morbid obesity (HCC) 02/28/2013  . Hyperlipidemia 02/28/2013  . CHRONIC OSTEOMYELITIS ANKLE AND FOOT 07/22/2009  . Hypertension 07/22/2009    Past Surgical History:  Procedure Laterality Date  . ABDOMINAL HYSTERECTOMY    . ANKLE SURGERY    . BACK SURGERY    . CARPAL TUNNEL RELEASE    . knee replacements     bilateral  . LEFT HEART CATHETERIZATION WITH CORONARY ANGIOGRAM N/A 03/02/2013   Procedure: LEFT HEART CATHETERIZATION WITH CORONARY ANGIOGRAM;  Surgeon: Peter M Swaziland, MD;  Location: [facility] CATH LAB;  Service: Cardiovascular;  Laterality: N/A;  .  TONSILLECTOMY    . TUBAL LIGATION       OB History    Gravida  2   Para  2   Term  2   Preterm      AB      Living  2     SAB      IAB      Ectopic      Multiple      Live Births  2           Family History  Problem Relation Age of Onset  . Heart attack Mother   . Diabetes Mother   . Hypertension Mother   . Sick sinus syndrome Brother        Pacemaker  . Congenital heart disease Brother   . Stroke Brother   . Coronary artery disease Brother   . Breast cancer Maternal Aunt   . Cancer Cousin        leukemia    Social  History   Tobacco Use  . Smoking status: Former Smoker    Packs/day: 1.00    Years: 2.00    Pack years: 2.00    Types: Cigarettes    Quit date: 12/13/1974    Years since quitting: 46.0  . Smokeless tobacco: Never Used  Vaping Use  . Vaping Use: Never used  Substance Use Topics  . Alcohol use: No  . Drug use: No    Home Medications Prior to Admission medications   Medication Sig Start Date End Date Taking? Authorizing Provider  Ascorbic Acid (VITAMIN C GUMMIES PO) Take 1 tablet by mouth daily.   Yes [provider]  atorvastatin (LIPITOR) 40 MG tablet Take 40 mg by mouth at bedtime.  10/15/14  Yes [provider]  clobetasol (TEMOVATE) 0.05 % external solution Apply 1 application topically 2 (two) times daily as needed (to scalp for use at 1-2 weeks at one time).  04/04/18  Yes [provider]  DULoxetine (CYMBALTA) 60 MG capsule TAKE ONE CAPSULE BY MOUTH EVERY MORNING and TAKE ONE TABLET BY MOUTH EVERY EVENING Patient taking differently: 60 mg 2 (two) times daily. Take 1 capsule by mouth every morning and 1 capsule every evening for depression 08/25/20  Yes Lomax, [name], NP  Fluocinolone Acetonide Scalp 0.01 % OIL Apply 1 application topically daily as needed (applied to scalp for psoriasis).    Yes [provider]  Galcanezumab-gnlm (EMGALITY) 120 MG/ML SOAJ Inject 1 pen into the skin every 30 (thirty) days. 11/20/20  Yes Sater, Pearletha Furl, MD  ibuprofen (ADVIL,MOTRIN) 200 MG tablet Take 200-400 mg by mouth daily as needed for mild pain or moderate pain.    Yes [provider]  levETIRAcetam (KEPPRA) 500 MG tablet TAKE ONE TABLET BY MOUTH AT BREAKFAST AND AT BEDTIME Patient taking differently: Take 500 mg by mouth 2 (two) times daily. 10/20/20  Yes Lomax, [name], NP  levothyroxine (SYNTHROID, LEVOTHROID) 100 MCG tablet Take 100 mcg by mouth every morning.  12/31/16  Yes [provider]  metFORMIN (GLUCOPHAGE) 500 MG tablet Take 500 mg by mouth  2 (two) times daily with a meal.  03/26/14  Yes [provider]  metoprolol (LOPRESSOR) 50 MG tablet Take 50 mg by mouth 2 (two) times daily.   Yes [provider]  nystatin cream (MYCOSTATIN) Apply 1 application topically 2 (two) times daily as needed for dry skin. 10/07/19  Yes Lazaro Arms, MD  oxybutynin (DITROPAN) 5 MG tablet TAKE ONE TABLET BY  MOUTH AT [facility] AND AT BEDTIME 09/09/20  Yes Lazaro Arms, MD  triamcinolone cream (KENALOG) 0.1 % Apply 1 application topically 2 (two) times daily as needed (for irritation). 10/07/19  Yes Lazaro Arms, MD  vitamin B-12 (CYANOCOBALAMIN) 100 MCG tablet Take 100 mcg by mouth daily.   Yes [provider]  zonisamide (ZONEGRAN) 100 MG capsule Take 3 capsules (300 mg total) by mouth at bedtime. 11/20/20  Yes Sater, Pearletha Furl, MD  methocarbamol (ROBAXIN) 500 MG tablet Take 1 tablet (500 mg total) by mouth 2 (two) times daily. Patient not taking: Reported on 11/30/2020 04/13/20   Gailen Shelter, PA    Allergies    Aspirin, Codeine, and Latex  Review of Systems   Review of Systems  All other systems reviewed and are negative.   Physical Exam Updated Vital Signs BP 129/77   Pulse (!) 52   Temp 98.5 F (36.9 C) (Oral)   Resp 18   Ht 5\' 7"  (1.702 m)   Wt 104.3 kg   SpO2 97%   BMI 36.02 kg/m   Physical Exam Vitals and nursing note reviewed.  Constitutional:      Appearance: Normal appearance. She is obese.  HENT:     Head: Normocephalic and atraumatic.     Right Ear: External ear normal.     Left Ear: External ear normal.     Nose: Nose normal.  Eyes:     General: No scleral icterus.       Right eye: No discharge.        Left eye: No discharge.     Extraocular Movements: Extraocular movements intact.     Conjunctiva/sclera: Conjunctivae normal.     Right eye: Right conjunctiva is not injected. No chemosis or exudate.    Left eye: Left conjunctiva is not injected. No chemosis or exudate.    Pupils:  Pupils are equal, round, and reactive to light.     Comments: Patient has some redness on the medial aspect of her right lower eyelid consistent with a very early stye. There is minimal swelling of the right lower medial eyelid with no redness yet.  Cardiovascular:     Rate and Rhythm: Normal rate.  Pulmonary:     Effort: Pulmonary effort is normal. No respiratory distress.  Musculoskeletal:        General: Normal range of motion.     Cervical back: Normal range of motion.  Skin:    General: Skin is warm and dry.  Neurological:     General: No focal deficit present.     Mental Status: She is alert and oriented to person, place, and time.     Cranial Nerves: No cranial nerve deficit.     Coordination: Abnormal coordination:   Psychiatric:        Mood and Affect: Mood normal.        Behavior: Behavior normal.        Thought Content: Thought content normal.      Visual Acuity  Right Eye Distance: 20/40 Left Eye Distance: 20/50 Bilateral Distance: 20/40  Right Eye Near:   Left Eye Near:    Bilateral Near:      ED Results / Procedures / Treatments   Labs (all labs ordered are listed, but only abnormal results are displayed) Results for orders placed or performed during the hospital encounter of 11/30/20  CBG monitoring, ED  Result Value Ref Range   Glucose-Capillary 72 70 - 99 mg/dL  Laboratory interpretation all normal     EKG None  Radiology No results found.  Procedures Procedures (including critical care time)  Medications Ordered in ED Medications  tobramycin (TOBREX) 0.3 % ophthalmic solution 2 drop (2 drops Both Eyes Given 12/01/20 0030)    ED Course  I have reviewed the triage vital signs and the nursing notes.  Pertinent labs & imaging results that were available during my care of the patient were reviewed by me and considered in my medical decision making (see chart for details).    MDM Rules/Calculators/A&P                          Patient  presents with pain on her medial lower eyelids bilaterally, left worse than the right with redness and more swelling on the left than the right. There is no evidence of conjunctivitis however she does appear to have a early stye    Final Clinical Impression(s) / ED Diagnoses Final diagnoses:  Hordeolum externum of left lower eyelid    Rx / DC Orders ED Discharge Orders    None     Plan discharge  Devoria Albe, MD, Concha Pyo, MD 12/01/20 [id number]

## 2020-12-01 NOTE — Discharge Instructions (Signed)
Use warm compresses for comfort. You can take ibuprofen or acetaminophen for pain. Use the eyedrops every 4 hours while awake. If it is not improving over the next couple days, please have your ophthalmologist recheck your eye.

## 2020-12-12 DIAGNOSIS — E039 Hypothyroidism, unspecified: Secondary | ICD-10-CM | POA: Diagnosis not present

## 2020-12-12 DIAGNOSIS — I10 Essential (primary) hypertension: Secondary | ICD-10-CM | POA: Diagnosis not present

## 2020-12-12 DIAGNOSIS — E1140 Type 2 diabetes mellitus with diabetic neuropathy, unspecified: Secondary | ICD-10-CM | POA: Diagnosis not present

## 2020-12-12 DIAGNOSIS — E782 Mixed hyperlipidemia: Secondary | ICD-10-CM | POA: Diagnosis not present

## 2020-12-12 DIAGNOSIS — R945 Abnormal results of liver function studies: Secondary | ICD-10-CM | POA: Diagnosis not present

## 2020-12-17 ENCOUNTER — Emergency Department (HOSPITAL_COMMUNITY)
Admission: EM | Admit: 2020-12-17 | Discharge: 2020-12-17 | Disposition: A | Discharge status: Home / Self Care | Payer: Medicare Other | Attending: Emergency Medicine | Admitting: Emergency Medicine

## 2020-12-17 ENCOUNTER — Encounter (HOSPITAL_COMMUNITY): Payer: Self-pay | Admitting: Emergency Medicine

## 2020-12-17 ENCOUNTER — Other Ambulatory Visit: Payer: Self-pay

## 2020-12-17 DIAGNOSIS — J45909 Unspecified asthma, uncomplicated: Secondary | ICD-10-CM | POA: Insufficient documentation

## 2020-12-17 DIAGNOSIS — E1140 Type 2 diabetes mellitus with diabetic neuropathy, unspecified: Secondary | ICD-10-CM | POA: Insufficient documentation

## 2020-12-17 DIAGNOSIS — U071 COVID-19: Secondary | ICD-10-CM | POA: Insufficient documentation

## 2020-12-17 DIAGNOSIS — Z7984 Long term (current) use of oral hypoglycemic drugs: Secondary | ICD-10-CM | POA: Diagnosis not present

## 2020-12-17 DIAGNOSIS — Z85038 Personal history of other malignant neoplasm of large intestine: Secondary | ICD-10-CM | POA: Diagnosis not present

## 2020-12-17 DIAGNOSIS — Z91040 Latex allergy status: Secondary | ICD-10-CM | POA: Insufficient documentation

## 2020-12-17 DIAGNOSIS — I10 Essential (primary) hypertension: Secondary | ICD-10-CM | POA: Diagnosis not present

## 2020-12-17 DIAGNOSIS — E039 Hypothyroidism, unspecified: Secondary | ICD-10-CM | POA: Insufficient documentation

## 2020-12-17 DIAGNOSIS — Z79899 Other long term (current) drug therapy: Secondary | ICD-10-CM | POA: Diagnosis not present

## 2020-12-17 DIAGNOSIS — Z87891 Personal history of nicotine dependence: Secondary | ICD-10-CM | POA: Diagnosis not present

## 2020-12-17 DIAGNOSIS — Z1152 Encounter for screening for COVID-19: Secondary | ICD-10-CM

## 2020-12-17 DIAGNOSIS — R059 Cough, unspecified: Secondary | ICD-10-CM | POA: Diagnosis present

## 2020-12-17 NOTE — ED Provider Notes (Signed)
[facility] EMERGENCY DEPARTMENT Provider Note   CSN: [id number] Arrival date & time: 12/17/20  1949     History Chief Complaint  Patient presents with  . Cough    [name] is a 65 y.o. female.  HPI      [name] is a 65 y.o. female with past medical history of asthma, fibromyalgia, hypertension, and type 2 diabetes who presents to the Emergency Department requesting to be tested for Covid.  She states that she was informed that multiple household members also tested positive for Covid earlier today.  She notes having a mild cough, nasal congestion, chest tightness associated with cough, and diarrhea.  Symptoms have been present for 3 days.  She describes her cough is mostly nonproductive.  No known fever, shortness of breath, abdominal pain, vomiting or dysuria.  Nothing makes her symptoms better or worse.  She states she has been vaccinated x2 for Covid, but no booster   Past Medical History:  Diagnosis Date  . Asthma   . Chronic back pain   . Chronic leg pain    Bilateral  . Chronic pelvic pain in female   . Essential hypertension   . Fibromyalgia   . H/O cardiac catheterization    No significant CAD (only 20% LAD) 03/02/13 with normal LV function.  Marland Kitchen Headache   . High cholesterol   . Hypothyroidism   . Mental disorder   . OAB (overactive bladder) 05/21/2013  . Obstructive sleep apnea    Noncompliant with CPAP due to claustophobia.  . Panic attacks   . Rectocele 05/21/2013  . Seizures (HCC)    1 severe seizure 2015; has had "small ones" since including 9/18  . Type 2 diabetes mellitus [facility])     Patient Active Problem List   Diagnosis Date Noted  . Chronic migraine w/o aura, not intractable, w/o stat migr 11/20/2020  . Diabetic neuropathy (HCC) 12/20/2019  . Current moderate episode of major depressive disorder without prior episode (HCC) 12/20/2019  . Myalgia 12/20/2019  . SUI (stress urinary incontinence, female) 05/05/2018  . Screening for  colorectal cancer 05/05/2018  . Encounter for well woman exam with routine gynecological exam 05/05/2018  . Breast pain, right 05/05/2018  . Insomnia 09/13/2017  . Restless leg 09/13/2017  . Superficial fungus infection of skin 05/04/2017  . Bruising 10/14/2016  . Chest pain 10/04/2016  . Hypothyroidism 10/04/2016  . Idiopathic intracranial hypertension 11/19/2015  . Papilledema 09/08/2015  . Left facial numbness 08/22/2015  . Epilepsy, generalized, convulsive (HCC) 06/05/2015  . Obstructive sleep apnea 06/05/2015  . Headache, occipital 06/05/2015  . Neck pain 06/05/2015  . Occipital neuralgia of right side 06/05/2015  . Seizure (HCC) 07/21/2014  . Rectocele 05/21/2013  . OAB (overactive bladder) 05/21/2013  . Difficulty in walking(719.7) 04/11/2013  . Leg weakness, bilateral 04/11/2013  . Diabetes (HCC) 02/28/2013  . Morbid obesity (HCC) 02/28/2013  . Hyperlipidemia 02/28/2013  . CHRONIC OSTEOMYELITIS ANKLE AND FOOT 07/22/2009  . Hypertension 07/22/2009    Past Surgical History:  Procedure Laterality Date  . ABDOMINAL HYSTERECTOMY    . ANKLE SURGERY    . BACK SURGERY    . CARPAL TUNNEL RELEASE    . knee replacements     bilateral  . LEFT HEART CATHETERIZATION WITH CORONARY ANGIOGRAM N/A 03/02/2013   Procedure: LEFT HEART CATHETERIZATION WITH CORONARY ANGIOGRAM;  Surgeon: Peter M Swaziland, MD;  Location: [facility] CATH LAB;  Service: Cardiovascular;  Laterality: N/A;  . TONSILLECTOMY    .  TUBAL LIGATION       OB History    Gravida  2   Para  2   Term  2   Preterm      AB      Living  2     SAB      IAB      Ectopic      Multiple      Live Births  2           Family History  Problem Relation Age of Onset  . Heart attack Mother   . Diabetes Mother   . Hypertension Mother   . Sick sinus syndrome Brother        Pacemaker  . Congenital heart disease Brother   . Stroke Brother   . Coronary artery disease Brother   . Breast cancer Maternal Aunt   .  Cancer Cousin        leukemia    Social History   Tobacco Use  . Smoking status: Former Smoker    Packs/day: 1.00    Years: 2.00    Pack years: 2.00    Types: Cigarettes    Quit date: 12/13/1974    Years since quitting: 46.0  . Smokeless tobacco: Never Used  Vaping Use  . Vaping Use: Never used  Substance Use Topics  . Alcohol use: No  . Drug use: No    Home Medications Prior to Admission medications   Medication Sig Start Date End Date Taking? Authorizing Provider  Ascorbic Acid (VITAMIN C GUMMIES PO) Take 1 tablet by mouth daily.    [provider]  atorvastatin (LIPITOR) 40 MG tablet Take 40 mg by mouth at bedtime.  10/15/14   [provider]  clobetasol (TEMOVATE) 0.05 % external solution Apply 1 application topically 2 (two) times daily as needed (to scalp for use at 1-2 weeks at one time).  04/04/18   [provider]  DULoxetine (CYMBALTA) 60 MG capsule TAKE ONE CAPSULE BY MOUTH EVERY MORNING and TAKE ONE TABLET BY MOUTH EVERY EVENING Patient taking differently: 60 mg 2 (two) times daily. Take 1 capsule by mouth every morning and 1 capsule every evening for depression 08/25/20   Lomax, [name], NP  Fluocinolone Acetonide Scalp 0.01 % OIL Apply 1 application topically daily as needed (applied to scalp for psoriasis).     [provider]  Galcanezumab-gnlm (EMGALITY) 120 MG/ML SOAJ Inject 1 pen into the skin every 30 (thirty) days. 11/20/20   Sater, Pearletha Furl, MD  ibuprofen (ADVIL,MOTRIN) 200 MG tablet Take 200-400 mg by mouth daily as needed for mild pain or moderate pain.     [provider]  levETIRAcetam (KEPPRA) 500 MG tablet TAKE ONE TABLET BY MOUTH AT BREAKFAST AND AT BEDTIME Patient taking differently: Take 500 mg by mouth 2 (two) times daily. 10/20/20   Lomax, [name], NP  levothyroxine (SYNTHROID, LEVOTHROID) 100 MCG tablet Take 100 mcg by mouth every morning.  12/31/16   [provider]  metFORMIN (GLUCOPHAGE) 500 MG tablet Take  500 mg by mouth 2 (two) times daily with a meal.  03/26/14   [provider]  methocarbamol (ROBAXIN) 500 MG tablet Take 1 tablet (500 mg total) by mouth 2 (two) times daily. Patient not taking: Reported on 11/30/2020 04/13/20   Gailen Shelter, PA  metoprolol (LOPRESSOR) 50 MG tablet Take 50 mg by mouth 2 (two) times daily.    [provider]  nystatin cream (MYCOSTATIN) Apply 1 application topically  2 (two) times daily as needed for dry skin. 10/07/19   Lazaro Arms, MD  oxybutynin (DITROPAN) 5 MG tablet TAKE ONE TABLET BY MOUTH AT [facility] AND AT BEDTIME 09/09/20   Lazaro Arms, MD  triamcinolone cream (KENALOG) 0.1 % Apply 1 application topically 2 (two) times daily as needed (for irritation). 10/07/19   Lazaro Arms, MD  vitamin B-12 (CYANOCOBALAMIN) 100 MCG tablet Take 100 mcg by mouth daily.    [provider]  zonisamide (ZONEGRAN) 100 MG capsule Take 3 capsules (300 mg total) by mouth at bedtime. 11/20/20   Sater, Pearletha Furl, MD    Allergies    Aspirin, Codeine, and Latex  Review of Systems   Review of Systems  Constitutional: Negative for appetite change, chills and fever.  HENT: Positive for congestion and rhinorrhea. Negative for sore throat and trouble swallowing.   Respiratory: Positive for cough and chest tightness. Negative for shortness of breath and wheezing.   Cardiovascular: Negative for chest pain.  Gastrointestinal: Positive for diarrhea. Negative for abdominal pain, nausea and vomiting.  Genitourinary: Negative for difficulty urinating and dysuria.  Musculoskeletal: Negative for arthralgias and myalgias.  Skin: Negative for rash.  Neurological: Negative for dizziness, weakness, numbness and headaches.  Hematological: Negative for adenopathy.    Physical Exam Updated Vital Signs BP 114/79 (BP Location: Right Arm)   Pulse (!) 56   Temp 98.2 F (36.8 C) (Oral)   Resp 18   Ht 5\' 7"  (1.702 m)   Wt 104.3 kg   SpO2 97%   BMI 36.02  kg/m   Physical Exam Vitals and nursing note reviewed.  Constitutional:      General: She is not in acute distress.    Appearance: Normal appearance. She is not ill-appearing.  Cardiovascular:     Rate and Rhythm: Normal rate and regular rhythm.     Pulses: Normal pulses.  Pulmonary:     Effort: Pulmonary effort is normal. No respiratory distress.     Breath sounds: Normal breath sounds. No wheezing.  Chest:     Chest wall: No tenderness.  Abdominal:     Palpations: Abdomen is soft.     Tenderness: There is no abdominal tenderness.  Musculoskeletal:        General: Normal range of motion.     Right lower leg: No edema.     Left lower leg: No edema.  Skin:    General: Skin is warm.     Capillary Refill: Capillary refill takes less than 2 seconds.     Findings: No rash.  Neurological:     General: No focal deficit present.     Mental Status: She is alert.     Sensory: No sensory deficit.     Motor: No weakness.     ED Results / Procedures / Treatments   Labs (all labs ordered are listed, but only abnormal results are displayed) Labs Reviewed  SARS CORONAVIRUS 2 (TAT 6-24 HRS)    EKG None  Radiology No results found.  Procedures Procedures (including critical care time)  Medications Ordered in ED Medications - No data to display  ED Course  I have reviewed the triage vital signs and the nursing notes.  Pertinent labs & imaging results that were available during my care of the patient were reviewed by me and considered in my medical decision making (see chart for details).    MDM Rules/Calculators/A&P  Patient here requesting to be tested for COVID-19.  Reports multiple household members have tested positive earlier today.  She does endorse having a 3-day history of symptoms suggestive of Covid.  On exam, she is well-appearing nontoxic.  No tachycardia hypoxia or tachypnea.  No increased work of breathing.  Covid test is pending.  I  feel that she is appropriate for discharge home at this time.  No symptoms concerning for emergent respiratory process.  Discussed isolation guidelines and patient verbalized understanding agrees to treatment at home with Tylenol   Return precautions discussed.  [name] was evaluated in Emergency Department on 12/17/2020 for the symptoms described in the history of present illness. She was evaluated in the context of the global COVID-19 pandemic, which necessitated consideration that the patient might be at risk for infection with the SARS-CoV-2 virus that causes COVID-19. Institutional protocols and algorithms that pertain to the evaluation of patients at risk for COVID-19 are in a state of rapid change based on information released by regulatory bodies including the CDC and federal and state organizations. These policies and algorithms were followed during the patient's care in the ED.    Final Clinical Impression(s) / ED Diagnoses Final diagnoses:  Encounter for screening for COVID-19    Rx / DC Orders ED Discharge Orders    None       Kem Parkinson, PA-C 12/17/20 2246    Lajean Saver, MD 12/17/20 2356

## 2020-12-17 NOTE — Discharge Instructions (Addendum)
Your covid test is pending.  Your results should be available in 24-48 hours.  You can review your results on Mychart.  Please isolate at home for 5 days.  On day 5, if you are feeling better on day 5 and you have no fever without taking any fever reducers you may return to normal activities but will need to wear a mask for 5 additional days.  If you are still feeling bad you will need to quarantine for the full 10 days.  Follow-up with your primary doctor for recheck.

## 2020-12-17 NOTE — ED Notes (Signed)
Runny nose, diarrhea at times, cough dry, loss of taste and smell, decrease appetite

## 2020-12-17 NOTE — ED Triage Notes (Signed)
Pt c/o cough and congestion for 2 days and lives with a covid many covid positive people who were tested here today.

## 2020-12-18 LAB — SARS CORONAVIRUS 2 (TAT 6-24 HRS): SARS Coronavirus 2: POSITIVE — AB

## 2021-01-10 DIAGNOSIS — R945 Abnormal results of liver function studies: Secondary | ICD-10-CM | POA: Diagnosis not present

## 2021-01-10 DIAGNOSIS — I10 Essential (primary) hypertension: Secondary | ICD-10-CM | POA: Diagnosis not present

## 2021-01-10 DIAGNOSIS — G44229 Chronic tension-type headache, not intractable: Secondary | ICD-10-CM | POA: Diagnosis not present

## 2021-01-10 DIAGNOSIS — K760 Fatty (change of) liver, not elsewhere classified: Secondary | ICD-10-CM | POA: Diagnosis not present

## 2021-01-10 DIAGNOSIS — G4733 Obstructive sleep apnea (adult) (pediatric): Secondary | ICD-10-CM | POA: Diagnosis not present

## 2021-01-10 DIAGNOSIS — G4700 Insomnia, unspecified: Secondary | ICD-10-CM | POA: Diagnosis not present

## 2021-01-10 DIAGNOSIS — E1140 Type 2 diabetes mellitus with diabetic neuropathy, unspecified: Secondary | ICD-10-CM | POA: Diagnosis not present

## 2021-01-10 DIAGNOSIS — E782 Mixed hyperlipidemia: Secondary | ICD-10-CM | POA: Diagnosis not present

## 2021-01-10 DIAGNOSIS — E039 Hypothyroidism, unspecified: Secondary | ICD-10-CM | POA: Diagnosis not present

## 2021-01-10 DIAGNOSIS — G9009 Other idiopathic peripheral autonomic neuropathy: Secondary | ICD-10-CM | POA: Diagnosis not present

## 2021-02-21 ENCOUNTER — Other Ambulatory Visit: Payer: Self-pay

## 2021-02-21 ENCOUNTER — Encounter (HOSPITAL_COMMUNITY): Payer: Self-pay | Admitting: Emergency Medicine

## 2021-02-21 ENCOUNTER — Emergency Department (HOSPITAL_COMMUNITY)
Admission: EM | Admit: 2021-02-21 | Discharge: 2021-02-21 | Disposition: A | Discharge status: Home / Self Care | Payer: 59 | Attending: Emergency Medicine | Admitting: Emergency Medicine

## 2021-02-21 DIAGNOSIS — Z91040 Latex allergy status: Secondary | ICD-10-CM | POA: Insufficient documentation

## 2021-02-21 DIAGNOSIS — E119 Type 2 diabetes mellitus without complications: Secondary | ICD-10-CM | POA: Insufficient documentation

## 2021-02-21 DIAGNOSIS — Z87891 Personal history of nicotine dependence: Secondary | ICD-10-CM | POA: Diagnosis not present

## 2021-02-21 DIAGNOSIS — I10 Essential (primary) hypertension: Secondary | ICD-10-CM | POA: Diagnosis not present

## 2021-02-21 DIAGNOSIS — Z79899 Other long term (current) drug therapy: Secondary | ICD-10-CM | POA: Diagnosis not present

## 2021-02-21 DIAGNOSIS — H5712 Ocular pain, left eye: Secondary | ICD-10-CM | POA: Diagnosis present

## 2021-02-21 DIAGNOSIS — J45909 Unspecified asthma, uncomplicated: Secondary | ICD-10-CM | POA: Insufficient documentation

## 2021-02-21 DIAGNOSIS — E039 Hypothyroidism, unspecified: Secondary | ICD-10-CM | POA: Diagnosis not present

## 2021-02-21 DIAGNOSIS — Z7984 Long term (current) use of oral hypoglycemic drugs: Secondary | ICD-10-CM | POA: Diagnosis not present

## 2021-02-21 MED ORDER — PREDNISOLONE ACETATE 0.12 % OP SUSP
1.0000 [drp] | Freq: Four times a day (QID) | OPHTHALMIC | Status: DC
  Filled 2021-02-21: qty 5

## 2021-02-21 MED ORDER — DORZOLAMIDE HCL-TIMOLOL MAL 2-0.5 % OP SOLN
1.0000 [drp] | Freq: Two times a day (BID) | OPHTHALMIC | Status: DC
  Administered 2021-02-21: 1 [drp] via OPHTHALMIC
  Filled 2021-02-21: qty 10

## 2021-02-21 MED ORDER — TETRACAINE HCL 0.5 % OP SOLN
2.0000 [drp] | Freq: Once | OPHTHALMIC | Status: AC
  Administered 2021-02-21: 2 [drp] via OPHTHALMIC
  Filled 2021-02-21: qty 4

## 2021-02-21 MED ORDER — DORZOLAMIDE HCL-TIMOLOL MAL 2-0.5 % OP SOLN
1.0000 [drp] | Freq: Two times a day (BID) | OPHTHALMIC | Status: DC
  Filled 2021-02-21: qty 10

## 2021-02-21 MED ORDER — FLUORESCEIN SODIUM 1 MG OP STRP
1.0000 | ORAL_STRIP | Freq: Once | OPHTHALMIC | Status: AC
  Administered 2021-02-21: 1 via OPHTHALMIC
  Filled 2021-02-21: qty 1

## 2021-02-21 MED ORDER — ATROPINE SULFATE 1 % OP SOLN
1.0000 [drp] | Freq: Once | OPHTHALMIC | Status: AC
  Administered 2021-02-21: 1 [drp] via OPHTHALMIC
  Filled 2021-02-21: qty 2

## 2021-02-21 MED ORDER — PREDNISOLONE ACETATE 1 % OP SUSP
1.0000 [drp] | Freq: Once | OPHTHALMIC | Status: DC
  Administered 2021-02-21: 1 [drp] via OPHTHALMIC
  Filled 2021-02-21: qty 1

## 2021-02-21 NOTE — ED Notes (Signed)
   02/21/21 1335  Visual Acuity  Bilateral Distance 20/25  R Distance 20/25  L Distance (S)  Pt reports being unable to read any measurement on chart.

## 2021-02-21 NOTE — ED Notes (Signed)
ED Provider at bedside. 

## 2021-02-21 NOTE — ED Provider Notes (Addendum)
[facility] EMERGENCY DEPARTMENT Provider Note   CSN: [id number] Arrival date & time: 02/21/21  1135     History Chief Complaint  Patient presents with  . Eye Pain    [name] is a 65 y.o. female.  The history is provided by the patient. No language interpreter was used.  Eye Pain This is a new problem. The current episode started 2 days ago. The problem occurs constantly. The problem has been gradually worsening. Nothing aggravates the symptoms. Nothing relieves the symptoms. She has tried nothing for the symptoms. The treatment provided no relief.  Pt reports she has had eye pain for 2 days.  Pt reports her eye is red and painful.  Pt complains of decreased vision. Pt denies any injury.  No drainage, no infection exposure      Past Medical History:  Diagnosis Date  . Asthma   . Chronic back pain   . Chronic leg pain    Bilateral  . Chronic pelvic pain in female   . Essential hypertension   . Fibromyalgia   . H/O cardiac catheterization    No significant CAD (only 20% LAD) 03/02/13 with normal LV function.  Marland Kitchen Headache   . High cholesterol   . Hypothyroidism   . Mental disorder   . OAB (overactive bladder) 05/21/2013  . Obstructive sleep apnea    Noncompliant with CPAP due to claustophobia.  . Panic attacks   . Rectocele 05/21/2013  . Seizures (HCC)    1 severe seizure 2015; has had "small ones" since including 9/18  . Type 2 diabetes mellitus [facility])     Patient Active Problem List   Diagnosis Date Noted  . Chronic migraine w/o aura, not intractable, w/o stat migr 11/20/2020  . Diabetic neuropathy (HCC) 12/20/2019  . Current moderate episode of major depressive disorder without prior episode (HCC) 12/20/2019  . Myalgia 12/20/2019  . SUI (stress urinary incontinence, female) 05/05/2018  . Screening for colorectal cancer 05/05/2018  . Encounter for well woman exam with routine gynecological exam 05/05/2018  . Breast pain, right 05/05/2018  . Insomnia  09/13/2017  . Restless leg 09/13/2017  . Superficial fungus infection of skin 05/04/2017  . Bruising 10/14/2016  . Chest pain 10/04/2016  . Hypothyroidism 10/04/2016  . Idiopathic intracranial hypertension 11/19/2015  . Papilledema 09/08/2015  . Left facial numbness 08/22/2015  . Epilepsy, generalized, convulsive (HCC) 06/05/2015  . Obstructive sleep apnea 06/05/2015  . Headache, occipital 06/05/2015  . Neck pain 06/05/2015  . Occipital neuralgia of right side 06/05/2015  . Seizure (HCC) 07/21/2014  . Rectocele 05/21/2013  . OAB (overactive bladder) 05/21/2013  . Difficulty in walking(719.7) 04/11/2013  . Leg weakness, bilateral 04/11/2013  . Diabetes (HCC) 02/28/2013  . Morbid obesity (HCC) 02/28/2013  . Hyperlipidemia 02/28/2013  . CHRONIC OSTEOMYELITIS ANKLE AND FOOT 07/22/2009  . Hypertension 07/22/2009    Past Surgical History:  Procedure Laterality Date  . ABDOMINAL HYSTERECTOMY    . ANKLE SURGERY    . BACK SURGERY    . CARPAL TUNNEL RELEASE    . knee replacements     bilateral  . LEFT HEART CATHETERIZATION WITH CORONARY ANGIOGRAM N/A 03/02/2013   Procedure: LEFT HEART CATHETERIZATION WITH CORONARY ANGIOGRAM;  Surgeon: Peter M Swaziland, MD;  Location: [facility] CATH LAB;  Service: Cardiovascular;  Laterality: N/A;  . TONSILLECTOMY    . TUBAL LIGATION       OB History    Gravida  2   Para  2   Term  2   Preterm      AB      Living  2     SAB      IAB      Ectopic      Multiple      Live Births  2           Family History  Problem Relation Age of Onset  . Heart attack Mother   . Diabetes Mother   . Hypertension Mother   . Sick sinus syndrome Brother        Pacemaker  . Congenital heart disease Brother   . Stroke Brother   . Coronary artery disease Brother   . Breast cancer Maternal Aunt   . Cancer Cousin        leukemia    Social History   Tobacco Use  . Smoking status: Former Smoker    Packs/day: 1.00    Years: 2.00    Pack years:  2.00    Types: Cigarettes    Quit date: 12/13/1974    Years since quitting: 46.2  . Smokeless tobacco: Never Used  Vaping Use  . Vaping Use: Never used  Substance Use Topics  . Alcohol use: No  . Drug use: No    Home Medications Prior to Admission medications   Medication Sig Start Date End Date Taking? Authorizing Provider  Ascorbic Acid (VITAMIN C GUMMIES PO) Take 1 tablet by mouth daily.    [provider]  atorvastatin (LIPITOR) 40 MG tablet Take 40 mg by mouth at bedtime.  10/15/14   [provider]  clobetasol (TEMOVATE) 0.05 % external solution Apply 1 application topically 2 (two) times daily as needed (to scalp for use at 1-2 weeks at one time).  04/04/18   [provider]  DULoxetine (CYMBALTA) 60 MG capsule TAKE ONE CAPSULE BY MOUTH EVERY MORNING and TAKE ONE TABLET BY MOUTH EVERY EVENING Patient taking differently: 60 mg 2 (two) times daily. Take 1 capsule by mouth every morning and 1 capsule every evening for depression 08/25/20   Lomax, [name], NP  Fluocinolone Acetonide Scalp 0.01 % OIL Apply 1 application topically daily as needed (applied to scalp for psoriasis).     [provider]  Galcanezumab-gnlm (EMGALITY) 120 MG/ML SOAJ Inject 1 pen into the skin every 30 (thirty) days. 11/20/20   Sater, Pearletha Furl, MD  ibuprofen (ADVIL,MOTRIN) 200 MG tablet Take 200-400 mg by mouth daily as needed for mild pain or moderate pain.     [provider]  levETIRAcetam (KEPPRA) 500 MG tablet TAKE ONE TABLET BY MOUTH AT BREAKFAST AND AT BEDTIME Patient taking differently: Take 500 mg by mouth 2 (two) times daily. 10/20/20   Lomax, [name], NP  levothyroxine (SYNTHROID, LEVOTHROID) 100 MCG tablet Take 100 mcg by mouth every morning.  12/31/16   [provider]  metFORMIN (GLUCOPHAGE) 500 MG tablet Take 500 mg by mouth 2 (two) times daily with a meal.  03/26/14   [provider]  methocarbamol (ROBAXIN) 500 MG tablet Take 1 tablet (500 mg total)  by mouth 2 (two) times daily. Patient not taking: Reported on 11/30/2020 04/13/20   Gailen Shelter, PA  metoprolol (LOPRESSOR) 50 MG tablet Take 50 mg by mouth 2 (two) times daily.    [provider]  nystatin cream (MYCOSTATIN) Apply 1 application topically 2 (two) times daily as needed for dry skin. 10/07/19   Lazaro Arms, MD  oxybutynin (DITROPAN) 5 MG tablet TAKE ONE TABLET  BY MOUTH AT [facility] AND AT BEDTIME 09/09/20   Lazaro Arms, MD  triamcinolone cream (KENALOG) 0.1 % Apply 1 application topically 2 (two) times daily as needed (for irritation). 10/07/19   Lazaro Arms, MD  vitamin B-12 (CYANOCOBALAMIN) 100 MCG tablet Take 100 mcg by mouth daily.    [provider]  zonisamide (ZONEGRAN) 100 MG capsule Take 3 capsules (300 mg total) by mouth at bedtime. 11/20/20   Sater, Pearletha Furl, MD    Allergies    Aspirin, Codeine, and Latex  Review of Systems   Review of Systems  Eyes: Positive for pain, redness and visual disturbance.  All other systems reviewed and are negative.   Physical Exam Updated Vital Signs BP 137/62 (BP Location: Right Arm)   Pulse (!) 52   Temp 97.9 F (36.6 C) (Oral)   Resp 17   Ht 5\' 7"  (1.702 m)   Wt 104.3 kg   SpO2 99%   BMI 36.02 kg/m   Physical Exam Vitals and nursing note reviewed.  Constitutional:      Appearance: She is well-developed.  HENT:     Head: Normocephalic.  Eyes:     Comments: Injected conjunctiva, erythematous, tearing  Tetracaine provided slight relief, fluorescein no uptake.  Tononpen pressure 35.  Right eye 11.   Cardiovascular:     Rate and Rhythm: Normal rate.  Pulmonary:     Effort: Pulmonary effort is normal.  Abdominal:     General: There is no distension.  Musculoskeletal:     Cervical back: Normal range of motion.  Skin:    General: Skin is warm.  Neurological:     Mental Status: She is alert and oriented to person, place, and time.     ED Results / Procedures / Treatments    Labs (all labs ordered are listed, but only abnormal results are displayed) Labs Reviewed - No data to display  EKG None  Radiology No results found.  Procedures Procedures   Medications Ordered in ED Medications  dorzolamide-timolol (COSOPT) 22.3-6.8 MG/ML ophthalmic solution 1 drop (has no administration in time range)  prednisoLONE acetate (PRED MILD) 0.12 % ophthalmic suspension 1 drop (has no administration in time range)  fluorescein ophthalmic strip 1 strip (1 strip Left Eye Given by Other 02/21/21 1256)  tetracaine (PONTOCAINE) 0.5 % ophthalmic solution 2 drop (2 drops Left Eye Given 02/21/21 1255)  atropine 1 % ophthalmic solution 1 drop (1 drop Left Eye Given 02/21/21 1601)    ED Course  I have reviewed the triage vital signs and the nursing notes.  Pertinent labs & imaging results that were available during my care of the patient were reviewed by me and considered in my medical decision making (see chart for details).    MDM Rules/Calculators/A&P                          MDM:  I spoke to Dr. 04/23/21 who advised prednisolone one drop qid, atropine one drop now. Cosopt one drop bid.  He advised recheck pressure in 20 minutes.  Repeat pressure 34 and 33.  Dr. Randon Goldsmith advised send pt to his office to be seen at 6pm.  (eye drops bagged and sent with pt)   Final Clinical Impression(s) / ED Diagnoses Final diagnoses:  Left eye pain    Rx / DC Orders ED Discharge Orders    None    An After Visit Summary was printed and given to the  patient.    Elson Areas, PA-C 02/21/21 1650    [address], [place] 02/21/21 1654    Rozelle Logan, DO 02/22/21 [phone]

## 2021-02-21 NOTE — ED Notes (Signed)
Entered room and introduced self to patient. Pt appears to be resting in bed, respirations are even and unlabored with equal chest rise and fall. Bed is locked in the lowest position, side rails x2, call bell within reach. Pt educated on call light use and hourly rounding, verbalized understanding and in agreement at this time. All questions and concerns voiced addressed. Refreshments offered and provided per patient request.  

## 2021-02-21 NOTE — ED Triage Notes (Signed)
Pt to the ED with Left eye pain for the last couple of days.  Pt has been using OTC eye drops without relief and had an appt with her eye doctor on Monday.

## 2021-02-21 NOTE — Discharge Instructions (Signed)
Go to [facility] Ophthalmology to be seen at [facility],

## 2021-02-21 NOTE — ED Notes (Signed)
Pt discharged by POV to Opthalmology office.

## 2021-03-03 ENCOUNTER — Telehealth: Payer: Self-pay | Admitting: *Deleted

## 2021-03-03 NOTE — Telephone Encounter (Signed)
Request Reference Number: [id number]. EMGALITY INJ 120MG /ML is approved through 12/12/2021. Your patient may now fill this prescription and it will be covered.

## 2021-03-03 NOTE — Telephone Encounter (Signed)
Submitted PA Emgality on CMM. Key: [id number]. PA Case ID: [id number] - Rx #: [id number]. Waiting on determination from [employer] D.

## 2021-04-20 ENCOUNTER — Other Ambulatory Visit: Payer: Self-pay

## 2021-04-20 MED ORDER — LEVETIRACETAM 500 MG PO TABS
ORAL_TABLET | ORAL | 5 refills | Status: DC
Start: 2021-04-20 — End: 2021-10-14

## 2021-05-19 ENCOUNTER — Emergency Department (HOSPITAL_COMMUNITY)
Admission: EM | Admit: 2021-05-19 | Discharge: 2021-05-19 | Disposition: A | Discharge status: Home / Self Care | Payer: 59 | Attending: Emergency Medicine | Admitting: Emergency Medicine

## 2021-05-19 ENCOUNTER — Emergency Department (HOSPITAL_COMMUNITY): Payer: 59

## 2021-05-19 ENCOUNTER — Encounter (HOSPITAL_COMMUNITY): Payer: Self-pay | Admitting: Emergency Medicine

## 2021-05-19 ENCOUNTER — Other Ambulatory Visit: Payer: Self-pay

## 2021-05-19 DIAGNOSIS — I10 Essential (primary) hypertension: Secondary | ICD-10-CM | POA: Insufficient documentation

## 2021-05-19 DIAGNOSIS — R200 Anesthesia of skin: Secondary | ICD-10-CM | POA: Insufficient documentation

## 2021-05-19 DIAGNOSIS — J45909 Unspecified asthma, uncomplicated: Secondary | ICD-10-CM | POA: Diagnosis not present

## 2021-05-19 DIAGNOSIS — Z87891 Personal history of nicotine dependence: Secondary | ICD-10-CM | POA: Diagnosis not present

## 2021-05-19 DIAGNOSIS — M542 Cervicalgia: Secondary | ICD-10-CM | POA: Diagnosis present

## 2021-05-19 DIAGNOSIS — R519 Headache, unspecified: Secondary | ICD-10-CM | POA: Diagnosis not present

## 2021-05-19 DIAGNOSIS — E039 Hypothyroidism, unspecified: Secondary | ICD-10-CM | POA: Insufficient documentation

## 2021-05-19 DIAGNOSIS — Z7984 Long term (current) use of oral hypoglycemic drugs: Secondary | ICD-10-CM | POA: Diagnosis not present

## 2021-05-19 DIAGNOSIS — Z9861 Coronary angioplasty status: Secondary | ICD-10-CM | POA: Diagnosis not present

## 2021-05-19 DIAGNOSIS — Z91040 Latex allergy status: Secondary | ICD-10-CM | POA: Diagnosis not present

## 2021-05-19 DIAGNOSIS — R030 Elevated blood-pressure reading, without diagnosis of hypertension: Secondary | ICD-10-CM

## 2021-05-19 DIAGNOSIS — Z20822 Contact with and (suspected) exposure to covid-19: Secondary | ICD-10-CM | POA: Insufficient documentation

## 2021-05-19 DIAGNOSIS — E1140 Type 2 diabetes mellitus with diabetic neuropathy, unspecified: Secondary | ICD-10-CM | POA: Diagnosis not present

## 2021-05-19 DIAGNOSIS — Z79899 Other long term (current) drug therapy: Secondary | ICD-10-CM | POA: Diagnosis not present

## 2021-05-19 LAB — DIFFERENTIAL
Abs Immature Granulocytes: 0.03 10*3/uL (ref 0.00–0.07)
Basophils Absolute: 0.1 10*3/uL (ref 0.0–0.1)
Basophils Relative: 1 %
Eosinophils Absolute: 0.2 10*3/uL (ref 0.0–0.5)
Eosinophils Relative: 2 %
Immature Granulocytes: 0 %
Lymphocytes Relative: 22 %
Lymphs Abs: 1.7 10*3/uL (ref 0.7–4.0)
Monocytes Absolute: 0.8 10*3/uL (ref 0.1–1.0)
Monocytes Relative: 11 %
Neutro Abs: 4.9 10*3/uL (ref 1.7–7.7)
Neutrophils Relative %: 64 %

## 2021-05-19 LAB — COMPREHENSIVE METABOLIC PANEL
ALT: 20 U/L (ref 0–44)
AST: 27 U/L (ref 15–41)
Albumin: 4.3 g/dL (ref 3.5–5.0)
Alkaline Phosphatase: 87 U/L (ref 38–126)
Anion gap: 4 — ABNORMAL LOW (ref 5–15)
BUN: 14 mg/dL (ref 8–23)
CO2: 28 mmol/L (ref 22–32)
Calcium: 9.3 mg/dL (ref 8.9–10.3)
Chloride: 108 mmol/L (ref 98–111)
Creatinine, Ser: 0.75 mg/dL (ref 0.44–1.00)
GFR, Estimated: >60 mL/min (ref >60)
Glucose, Bld: 114 mg/dL — ABNORMAL HIGH (ref 70–99)
Potassium: 4.2 mmol/L (ref 3.5–5.1)
Sodium: 140 mmol/L (ref 135–145)
Total Bilirubin: 0.9 mg/dL (ref 0.3–1.2)
Total Protein: 7.6 g/dL (ref 6.5–8.1)

## 2021-05-19 LAB — CBC
HCT: 44.7 % (ref 36.0–46.0)
Hemoglobin: 14.5 g/dL (ref 12.0–15.0)
MCH: 30.2 pg (ref 26.0–34.0)
MCHC: 32.4 g/dL (ref 30.0–36.0)
MCV: 93.1 fL (ref 80.0–100.0)
Platelets: 219 10*3/uL (ref 150–400)
RBC: 4.8 MIL/uL (ref 3.87–5.11)
RDW: 12.1 % (ref 11.5–15.5)
WBC: 7.6 10*3/uL (ref 4.0–10.5)
nRBC: 0 % (ref 0.0–0.2)

## 2021-05-19 LAB — PROTIME-INR
INR: 1.1 (ref 0.8–1.2)
Prothrombin Time: 13.8 seconds (ref 11.4–15.2)

## 2021-05-19 LAB — RAPID URINE DRUG SCREEN, HOSP PERFORMED
Amphetamines: NOT DETECTED
Barbiturates: NOT DETECTED
Benzodiazepines: NOT DETECTED
Cocaine: NOT DETECTED
Opiates: NOT DETECTED
Tetrahydrocannabinol: NOT DETECTED

## 2021-05-19 LAB — URINALYSIS, ROUTINE W REFLEX MICROSCOPIC
Bilirubin Urine: NEGATIVE
Glucose, UA: NEGATIVE mg/dL
Hgb urine dipstick: NEGATIVE
Ketones, ur: NEGATIVE mg/dL
Leukocytes,Ua: NEGATIVE
Nitrite: NEGATIVE
Protein, ur: NEGATIVE mg/dL
Specific Gravity, Urine: 1.011 (ref 1.005–1.030)
pH: 8 (ref 5.0–8.0)

## 2021-05-19 LAB — RESP PANEL BY RT-PCR (FLU A&B, COVID) ARPGX2
Influenza A by PCR: NEGATIVE
Influenza B by PCR: NEGATIVE
SARS Coronavirus 2 by RT PCR: NEGATIVE

## 2021-05-19 LAB — APTT: aPTT: 29 seconds (ref 24–36)

## 2021-05-19 LAB — ETHANOL: Alcohol, Ethyl (B): <10 mg/dL (ref <10)

## 2021-05-19 MED ORDER — ACETAMINOPHEN 500 MG PO TABS
1000.0000 mg | ORAL_TABLET | Freq: Once | ORAL | Status: AC
  Administered 2021-05-19: 1000 mg via ORAL
  Filled 2021-05-19: qty 2

## 2021-05-19 MED ORDER — METHOCARBAMOL 750 MG PO TABS: 750.0000 mg | ORAL_TABLET | Freq: Three times a day (TID) | ORAL | 0 refills | Status: DC | PRN

## 2021-05-19 MED ORDER — IOHEXOL 350 MG/ML SOLN
100.0000 mL | Freq: Once | INTRAVENOUS | Status: AC | PRN
  Administered 2021-05-19: 75 mL via INTRAVENOUS

## 2021-05-19 MED ORDER — TRAMADOL HCL 50 MG PO TABS: 50.0000 mg | ORAL_TABLET | Freq: Four times a day (QID) | ORAL | 0 refills | Status: DC | PRN

## 2021-05-19 MED ORDER — ONDANSETRON HCL 4 MG/2ML IJ SOLN
4.0000 mg | Freq: Once | INTRAMUSCULAR | Status: AC
  Administered 2021-05-19: 4 mg via INTRAVENOUS
  Filled 2021-05-19: qty 2

## 2021-05-19 MED ORDER — METHOCARBAMOL 500 MG PO TABS
750.0000 mg | ORAL_TABLET | Freq: Once | ORAL | Status: AC
  Administered 2021-05-19: 750 mg via ORAL
  Filled 2021-05-19: qty 2

## 2021-05-19 MED ORDER — HYDROMORPHONE HCL 1 MG/ML IJ SOLN
1.0000 mg | Freq: Once | INTRAMUSCULAR | Status: AC
  Administered 2021-05-19: 1 mg via INTRAVENOUS
  Filled 2021-05-19: qty 1

## 2021-05-19 MED ORDER — PREDNISONE 50 MG PO TABS
60.0000 mg | ORAL_TABLET | Freq: Once | ORAL | Status: AC
  Administered 2021-05-19: 60 mg via ORAL
  Filled 2021-05-19: qty 1

## 2021-05-19 NOTE — ED Provider Notes (Signed)
Patient signed out to d/c to home if/when CTAs neg for acute process.   CTA neg for acute process. Patient notes pain on side of posterior neck that shoots upwards and occasionally towards left shoulder w certain movements/positional changes. No numbness/weakness. No trigger point felt. C spine without midline/bony tenderness. No neck stiffness/rigidity. Afebrile. No fever,chills or sweats.   Prednisone po, acetaminophen po, robaxin po.   Rec pcp f/u.  Return precautions provided.    Cathren Laine, MD 05/19/21 [phone]

## 2021-05-19 NOTE — Discharge Instructions (Addendum)
It was our pleasure to provide your ER care today - we hope that you feel better.  Try heat therapy and/or very gentle massage to sore area.   Take prednisone as prescribed. Take acetaminophen as need for pain. You may also try ultram as need for pain, and robaxin as need for muscle pain/spasm - no driving when taking these meds.   Your CT scan was read as follows: IMPRESSION: No emergent finding. Atherosclerosis in the neck with likely high-grade ostial narrowing of the left vertebral artery.  Have your doctor review CT findings and discuss with them.   Follow up with primary care doctor in one week - call office to arrange appointment. Also have your blood pressure rechecked then, as it is high today.   Return to ER if worse, new symptoms, fevers, new, severe or intractable pain, numbness/weakness, or other concern.   You were given pain medication in the ER - no driving for the next 8 hours.

## 2021-05-19 NOTE — ED Notes (Signed)
Pt back from CT

## 2021-05-19 NOTE — ED Triage Notes (Signed)
Pt c/o neck pain that started 2 days ago and now the pain is more severe and radiates to the back of the head.

## 2021-05-19 NOTE — ED Provider Notes (Signed)
[facility] EMERGENCY DEPARTMENT Provider Note   CSN: [id number] Arrival date & time: 05/19/21  0344     History Chief Complaint  Patient presents with  . Neck Pain    [name] is a 65 y.o. female.  The history is provided by the patient.  Neck Pain Pain location:  L side Quality:  Aching and shooting Pain radiates to:  Head Pain severity:  Severe Onset quality:  Gradual Duration:  2 days Timing:  Constant Progression:  Worsening Chronicity:  New Context: not fall and not lifting a heavy object   Relieved by:  Nothing Worsened by:  Position Associated symptoms: headaches and numbness   Associated symptoms: no chest pain, no fever and no weakness    Patient with history of obesity, chronic pain presents with neck pain.  Patient reports about 2 days ago she began having gradual onset of left-sided neck pain.  Denies any falls or traumas.  She then reports that over the past day the pain is radiating to her left head and is worsening.  It was not sudden onset.  She also reports pain is radiating from her neck into her left arm with some numbness.  No weakness.  No visual changes.    Past Medical History:  Diagnosis Date  . Asthma   . Chronic back pain   . Chronic leg pain    Bilateral  . Chronic pelvic pain in female   . Essential hypertension   . Fibromyalgia   . H/O cardiac catheterization    No significant CAD (only 20% LAD) 03/02/13 with normal LV function.  Marland Kitchen Headache   . High cholesterol   . Hypothyroidism   . Mental disorder   . OAB (overactive bladder) 05/21/2013  . Obstructive sleep apnea    Noncompliant with CPAP due to claustophobia.  . Panic attacks   . Rectocele 05/21/2013  . Seizures (HCC)    1 severe seizure 2015; has had "small ones" since including 9/18  . Type 2 diabetes mellitus [facility])     Patient Active Problem List   Diagnosis Date Noted  . Chronic migraine w/o aura, not intractable, w/o stat migr 11/20/2020  . Diabetic neuropathy  (HCC) 12/20/2019  . Current moderate episode of major depressive disorder without prior episode (HCC) 12/20/2019  . Myalgia 12/20/2019  . SUI (stress urinary incontinence, female) 05/05/2018  . Screening for colorectal cancer 05/05/2018  . Encounter for well woman exam with routine gynecological exam 05/05/2018  . Breast pain, right 05/05/2018  . Insomnia 09/13/2017  . Restless leg 09/13/2017  . Superficial fungus infection of skin 05/04/2017  . Bruising 10/14/2016  . Chest pain 10/04/2016  . Hypothyroidism 10/04/2016  . Idiopathic intracranial hypertension 11/19/2015  . Papilledema 09/08/2015  . Left facial numbness 08/22/2015  . Epilepsy, generalized, convulsive (HCC) 06/05/2015  . Obstructive sleep apnea 06/05/2015  . Headache, occipital 06/05/2015  . Neck pain 06/05/2015  . Occipital neuralgia of right side 06/05/2015  . Seizure (HCC) 07/21/2014  . Rectocele 05/21/2013  . OAB (overactive bladder) 05/21/2013  . Difficulty in walking(719.7) 04/11/2013  . Leg weakness, bilateral 04/11/2013  . Diabetes (HCC) 02/28/2013  . Morbid obesity (HCC) 02/28/2013  . Hyperlipidemia 02/28/2013  . CHRONIC OSTEOMYELITIS ANKLE AND FOOT 07/22/2009  . Hypertension 07/22/2009    Past Surgical History:  Procedure Laterality Date  . ABDOMINAL HYSTERECTOMY    . ANKLE SURGERY    . BACK SURGERY    . CARPAL TUNNEL RELEASE    . knee  replacements     bilateral  . LEFT HEART CATHETERIZATION WITH CORONARY ANGIOGRAM N/A 03/02/2013   Procedure: LEFT HEART CATHETERIZATION WITH CORONARY ANGIOGRAM;  Surgeon: Peter M Swaziland, MD;  Location: [facility] CATH LAB;  Service: Cardiovascular;  Laterality: N/A;  . TONSILLECTOMY    . TUBAL LIGATION       OB History    Gravida  2   Para  2   Term  2   Preterm      AB      Living  2     SAB      IAB      Ectopic      Multiple      Live Births  2           Family History  Problem Relation Age of Onset  . Heart attack Mother   . Diabetes  Mother   . Hypertension Mother   . Sick sinus syndrome Brother        Pacemaker  . Congenital heart disease Brother   . Stroke Brother   . Coronary artery disease Brother   . Breast cancer Maternal Aunt   . Cancer Cousin        leukemia    Social History   Tobacco Use  . Smoking status: Former Smoker    Packs/day: 1.00    Years: 2.00    Pack years: 2.00    Types: Cigarettes    Quit date: 12/13/1974    Years since quitting: 46.4  . Smokeless tobacco: Never Used  Vaping Use  . Vaping Use: Never used  Substance Use Topics  . Alcohol use: No  . Drug use: No    Home Medications Prior to Admission medications   Medication Sig Start Date End Date Taking? Authorizing Provider  Ascorbic Acid (VITAMIN C GUMMIES PO) Take 1 tablet by mouth daily.    [provider]  atorvastatin (LIPITOR) 40 MG tablet Take 40 mg by mouth at bedtime.  10/15/14   [provider]  clobetasol (TEMOVATE) 0.05 % external solution Apply 1 application topically 2 (two) times daily as needed (to scalp for use at 1-2 weeks at one time).  04/04/18   [provider]  DULoxetine (CYMBALTA) 60 MG capsule TAKE ONE CAPSULE BY MOUTH EVERY MORNING and TAKE ONE TABLET BY MOUTH EVERY EVENING Patient taking differently: 60 mg 2 (two) times daily. Take 1 capsule by mouth every morning and 1 capsule every evening for depression 08/25/20   Lomax, [name], NP  Fluocinolone Acetonide Scalp 0.01 % OIL Apply 1 application topically daily as needed (applied to scalp for psoriasis).     [provider]  Galcanezumab-gnlm (EMGALITY) 120 MG/ML SOAJ Inject 1 pen into the skin every 30 (thirty) days. 11/20/20   Sater, Pearletha Furl, MD  ibuprofen (ADVIL,MOTRIN) 200 MG tablet Take 200-400 mg by mouth daily as needed for mild pain or moderate pain.     [provider]  levETIRAcetam (KEPPRA) 500 MG tablet TAKE ONE TABLET BY MOUTH AT BREAKFAST AND AT BEDTIME 04/20/21   Lomax, [name], NP  levothyroxine (SYNTHROID,  LEVOTHROID) 100 MCG tablet Take 100 mcg by mouth every morning.  12/31/16   [provider]  metFORMIN (GLUCOPHAGE) 500 MG tablet Take 500 mg by mouth 2 (two) times daily with a meal.  03/26/14   [provider]  methocarbamol (ROBAXIN) 500 MG tablet Take 1 tablet (500 mg total) by mouth 2 (two) times daily. Patient not taking: Reported on  11/30/2020 04/13/20   Gailen Shelter, PA  metoprolol (LOPRESSOR) 50 MG tablet Take 50 mg by mouth 2 (two) times daily.    [provider]  nystatin cream (MYCOSTATIN) Apply 1 application topically 2 (two) times daily as needed for dry skin. 10/07/19   Lazaro Arms, MD  oxybutynin (DITROPAN) 5 MG tablet TAKE ONE TABLET BY MOUTH AT [facility] AND AT BEDTIME 09/09/20   Lazaro Arms, MD  triamcinolone cream (KENALOG) 0.1 % Apply 1 application topically 2 (two) times daily as needed (for irritation). 10/07/19   Lazaro Arms, MD  vitamin B-12 (CYANOCOBALAMIN) 100 MCG tablet Take 100 mcg by mouth daily.    [provider]  zonisamide (ZONEGRAN) 100 MG capsule Take 3 capsules (300 mg total) by mouth at bedtime. 11/20/20   Sater, Pearletha Furl, MD    Allergies    Aspirin, Codeine, and Latex  Review of Systems   Review of Systems  Constitutional: Negative for fever.  Eyes: Negative for visual disturbance.  Cardiovascular: Negative for chest pain.  Musculoskeletal: Positive for neck pain.  Neurological: Positive for numbness and headaches. Negative for weakness.  All other systems reviewed and are negative.   Physical Exam Updated Vital Signs BP (!) 145/74   Pulse 65   Temp 98.1 F (36.7 C) (Oral)   Resp 16   Ht 1.702 m (5\' 7" )   Wt 104.3 kg   SpO2 99%   BMI 36.02 kg/m   Physical Exam CONSTITUTIONAL: Chronically ill-appearing, anxious HEAD: Normocephalic/atraumatic, no step-offs, no edema, no erythema, no rash EYES: EOMI/PERRL, no nystagmus,  no ptosis ENMT: Mucous membranes moist NECK: Tenderness along the left side  of her neck, no erythema, no mass, no bruits CV: S1/S2 noted, no murmurs/rubs/gallops noted LUNGS: Lungs are clear to auscultation bilaterally, no apparent distress ABDOMEN: soft, nontender, no rebound or guarding GU:no cva tenderness NEURO:Awake/alert, face symmetric, no arm or leg drift is noted Equal 5/5 strength with shoulder abduction, elbow flex/extension, wrist flex/extension in upper extremities Equal 5/5 strength with hip flexion,knee flex/extension, foot dorsi/plantar flexion Cranial nerves 3/4/5/6/06/20/09/11/12 tested and intact Patient reports subjective numbness from her left shoulder and her left arm EXTREMITIES: pulses normalx4, full ROM SKIN: warm, color normal PSYCH: no abnormalities of mood noted  ED Results / Procedures / Treatments   Labs (all labs ordered are listed, but only abnormal results are displayed) Labs Reviewed  COMPREHENSIVE METABOLIC PANEL - Abnormal; Notable for the following components:      Result Value   Glucose, Bld 114 (*)    Anion gap 4 (*)    All other components within normal limits  URINALYSIS, ROUTINE W REFLEX MICROSCOPIC - Abnormal; Notable for the following components:   APPearance HAZY (*)    All other components within normal limits  RESP PANEL BY RT-PCR (FLU A&B, COVID) ARPGX2  ETHANOL  PROTIME-INR  APTT  CBC  DIFFERENTIAL  RAPID URINE DRUG SCREEN, HOSP PERFORMED    EKG ED ECG REPORT   Date: 05/19/2021 0536am  Rate: 65  Rhythm: normal sinus rhythm  QRS Axis: normal  Intervals: normal  ST/T Wave abnormalities: nonspecific ST changes  Conduction Disutrbances:none Artifact noted  I have personally reviewed the EKG tracing and agree with the computerized printout as noted.  Radiology No results found.  Procedures Procedures   Medications Ordered in ED Medications  iohexol (OMNIPAQUE) 350 MG/ML injection 100 mL (has no administration in time range)  HYDROmorphone (DILAUDID) injection 1 mg (1 mg Intravenous Given  05/19/21  0534)  ondansetron (ZOFRAN) injection 4 mg (4 mg Intravenous Given 05/19/21 0534)    ED Course  I have reviewed the triage vital signs and the nursing notes.  Pertinent labs & imaging results that were available during my care of the patient were reviewed by me and considered in my medical decision making (see chart for details).    MDM Rules/Calculators/A&P                          5:52 AM Patient presents with ongoing left neck pain now radiating into her head.  She also reports pain and numbness radiates from her neck into her arm.  We will treat pain and reassess No gross neurodeficits to suggest acute stroke, symptoms have been present for up to 48 hrs 7:02 AM Per neurology records, pt has had neck pain with numbness in left arm previously, and also has h/o migraines Overall she is improving However, will proceed with CT angio head/neck to r/o any acute vascular occlusion/dissection If imaging is otherwise unremarkable and pt improved she can be discharged to f/u with neurology Signed out to Dr. Denton Lank at shift change Final Clinical Impression(s) / ED Diagnoses Final diagnoses:  None    Rx / DC Orders ED Discharge Orders    None       Zadie Rhine, MD 05/19/21 [phone]

## 2021-08-19 ENCOUNTER — Other Ambulatory Visit: Payer: Self-pay

## 2021-08-19 ENCOUNTER — Other Ambulatory Visit (HOSPITAL_COMMUNITY): Payer: Self-pay | Admitting: Family Medicine

## 2021-08-19 DIAGNOSIS — Z13820 Encounter for screening for osteoporosis: Secondary | ICD-10-CM

## 2021-08-19 MED ORDER — DULOXETINE HCL 60 MG PO CPEP: 60.0000 mg | ORAL_CAPSULE | Freq: Two times a day (BID) | ORAL | 11 refills | Status: DC

## 2021-08-20 ENCOUNTER — Encounter (INDEPENDENT_AMBULATORY_CARE_PROVIDER_SITE_OTHER): Payer: Self-pay | Admitting: *Deleted

## 2021-08-27 ENCOUNTER — Other Ambulatory Visit: Payer: Self-pay

## 2021-08-27 ENCOUNTER — Ambulatory Visit (HOSPITAL_COMMUNITY)
Admission: RE | Admit: 2021-08-27 | Discharge: 2021-08-27 | Disposition: A | Discharge status: Home / Self Care | Payer: Medicare Other | Source: Ambulatory Visit | Attending: Family Medicine | Admitting: Family Medicine

## 2021-08-27 DIAGNOSIS — Z780 Asymptomatic menopausal state: Secondary | ICD-10-CM | POA: Diagnosis not present

## 2021-08-27 DIAGNOSIS — Z13820 Encounter for screening for osteoporosis: Secondary | ICD-10-CM | POA: Diagnosis present

## 2021-09-14 ENCOUNTER — Other Ambulatory Visit: Payer: Self-pay | Admitting: Obstetrics & Gynecology

## 2021-10-12 DIAGNOSIS — E1165 Type 2 diabetes mellitus with hyperglycemia: Secondary | ICD-10-CM | POA: Diagnosis not present

## 2021-10-12 DIAGNOSIS — I10 Essential (primary) hypertension: Secondary | ICD-10-CM | POA: Diagnosis not present

## 2021-10-14 ENCOUNTER — Other Ambulatory Visit: Payer: Self-pay

## 2021-10-14 MED ORDER — LEVETIRACETAM 500 MG PO TABS: ORAL_TABLET | ORAL | 3 refills | Status: DC

## 2021-10-16 ENCOUNTER — Encounter: Payer: Self-pay | Admitting: Emergency Medicine

## 2021-10-16 ENCOUNTER — Other Ambulatory Visit: Payer: Self-pay

## 2021-10-16 ENCOUNTER — Ambulatory Visit
Admission: EM | Admit: 2021-10-16 | Discharge: 2021-10-16 | Disposition: A | Discharge status: Home / Self Care | Payer: Medicare Other | Attending: Family Medicine | Admitting: Family Medicine

## 2021-10-16 DIAGNOSIS — Z20822 Contact with and (suspected) exposure to covid-19: Secondary | ICD-10-CM | POA: Diagnosis not present

## 2021-10-16 NOTE — ED Triage Notes (Signed)
Exposure to covid.  Only want covid test, no symptoms

## 2021-10-17 LAB — NOVEL CORONAVIRUS, NAA: SARS-CoV-2, NAA: NOT DETECTED

## 2021-10-17 LAB — SARS-COV-2, NAA 2 DAY TAT

## 2021-11-08 ENCOUNTER — Emergency Department (HOSPITAL_COMMUNITY)
Admission: EM | Admit: 2021-11-08 | Discharge: 2021-11-08 | Disposition: A | Discharge status: Home / Self Care | Payer: Medicare Other | Attending: Emergency Medicine | Admitting: Emergency Medicine

## 2021-11-08 ENCOUNTER — Emergency Department (HOSPITAL_COMMUNITY): Payer: Medicare Other

## 2021-11-08 ENCOUNTER — Other Ambulatory Visit: Payer: Self-pay

## 2021-11-08 ENCOUNTER — Encounter (HOSPITAL_COMMUNITY): Payer: Self-pay

## 2021-11-08 DIAGNOSIS — Z87891 Personal history of nicotine dependence: Secondary | ICD-10-CM | POA: Insufficient documentation

## 2021-11-08 DIAGNOSIS — R0789 Other chest pain: Secondary | ICD-10-CM | POA: Diagnosis not present

## 2021-11-08 DIAGNOSIS — Z91040 Latex allergy status: Secondary | ICD-10-CM | POA: Insufficient documentation

## 2021-11-08 DIAGNOSIS — R079 Chest pain, unspecified: Secondary | ICD-10-CM | POA: Diagnosis not present

## 2021-11-08 DIAGNOSIS — Z7984 Long term (current) use of oral hypoglycemic drugs: Secondary | ICD-10-CM | POA: Insufficient documentation

## 2021-11-08 DIAGNOSIS — I10 Essential (primary) hypertension: Secondary | ICD-10-CM | POA: Insufficient documentation

## 2021-11-08 DIAGNOSIS — Z20822 Contact with and (suspected) exposure to covid-19: Secondary | ICD-10-CM | POA: Diagnosis not present

## 2021-11-08 DIAGNOSIS — E1140 Type 2 diabetes mellitus with diabetic neuropathy, unspecified: Secondary | ICD-10-CM | POA: Diagnosis not present

## 2021-11-08 DIAGNOSIS — R059 Cough, unspecified: Secondary | ICD-10-CM | POA: Diagnosis present

## 2021-11-08 DIAGNOSIS — Z79899 Other long term (current) drug therapy: Secondary | ICD-10-CM | POA: Diagnosis not present

## 2021-11-08 DIAGNOSIS — E039 Hypothyroidism, unspecified: Secondary | ICD-10-CM | POA: Diagnosis not present

## 2021-11-08 DIAGNOSIS — J069 Acute upper respiratory infection, unspecified: Secondary | ICD-10-CM | POA: Diagnosis not present

## 2021-11-08 DIAGNOSIS — J45909 Unspecified asthma, uncomplicated: Secondary | ICD-10-CM | POA: Diagnosis not present

## 2021-11-08 DIAGNOSIS — B9789 Other viral agents as the cause of diseases classified elsewhere: Secondary | ICD-10-CM | POA: Diagnosis not present

## 2021-11-08 LAB — CBC
HCT: 46.9 % — ABNORMAL HIGH (ref 36.0–46.0)
Hemoglobin: 14.8 g/dL (ref 12.0–15.0)
MCH: 29.1 pg (ref 26.0–34.0)
MCHC: 31.6 g/dL (ref 30.0–36.0)
MCV: 92.3 fL (ref 80.0–100.0)
Platelets: 219 10*3/uL (ref 150–400)
RBC: 5.08 MIL/uL (ref 3.87–5.11)
RDW: 12.4 % (ref 11.5–15.5)
WBC: 7.1 10*3/uL (ref 4.0–10.5)
nRBC: 0 % (ref 0.0–0.2)

## 2021-11-08 LAB — BASIC METABOLIC PANEL
Anion gap: 8 (ref 5–15)
BUN: 16 mg/dL (ref 8–23)
CO2: 24 mmol/L (ref 22–32)
Calcium: 9.5 mg/dL (ref 8.9–10.3)
Chloride: 105 mmol/L (ref 98–111)
Creatinine, Ser: 0.92 mg/dL (ref 0.44–1.00)
GFR, Estimated: >60 mL/min (ref >60)
Glucose, Bld: 80 mg/dL (ref 70–99)
Potassium: 3.7 mmol/L (ref 3.5–5.1)
Sodium: 137 mmol/L (ref 135–145)

## 2021-11-08 LAB — RESP PANEL BY RT-PCR (FLU A&B, COVID) ARPGX2
Influenza A by PCR: NEGATIVE
Influenza B by PCR: NEGATIVE
SARS Coronavirus 2 by RT PCR: NEGATIVE

## 2021-11-08 LAB — TROPONIN I (HIGH SENSITIVITY)
Troponin I (High Sensitivity): 2 ng/L (ref <18)
Troponin I (High Sensitivity): 2 ng/L (ref <18)

## 2021-11-08 MED ORDER — HYDROCOD POLST-CPM POLST ER 10-8 MG/5ML PO SUER
5.0000 mL | Freq: Once | ORAL | Status: AC
  Administered 2021-11-08: 19:00:00 5 mL via ORAL
  Filled 2021-11-08: qty 5

## 2021-11-08 MED ORDER — HYDROCOD POLST-CPM POLST ER 10-8 MG/5ML PO SUER: 5.0000 mL | Freq: Two times a day (BID) | ORAL | 0 refills | Status: DC | PRN

## 2021-11-08 MED ORDER — ACETAMINOPHEN 500 MG PO TABS
1000.0000 mg | ORAL_TABLET | Freq: Once | ORAL | Status: AC
  Administered 2021-11-08: 17:00:00 1000 mg via ORAL
  Filled 2021-11-08: qty 2

## 2021-11-08 MED ORDER — SODIUM CHLORIDE 0.9 % IV BOLUS
1000.0000 mL | Freq: Once | INTRAVENOUS | Status: AC
  Administered 2021-11-08: 17:00:00 1000 mL via INTRAVENOUS

## 2021-11-08 NOTE — ED Triage Notes (Signed)
PT triaged in room. Pt placed on cardiac monitor with BP to set cycle every 30 minutes. Continuous pulse oximeter applied. EKG completed.

## 2021-11-08 NOTE — ED Triage Notes (Signed)
Left anterior chest pain that radiates to left arm since 0200, 10/10 accompanied by nausea and headache, co chills has not checked temp by thermometer.

## 2021-11-08 NOTE — ED Provider Notes (Signed)
[facility] EMERGENCY DEPARTMENT Provider Note   CSN: [id number] Arrival date & time: 11/08/21  1639     History Chief Complaint  Patient presents with   Chest Pain   Cough    [name] is a 65 y.o. female.  Pt presents to the ED today with cp, chills, cough.  Pt said she started feeling ill this morning.  "All the grandkids" have colds.  Pt has not been vaccinated against the flu.  She was vaccinated against Covid.  She has not taken anything for her sx today.      Past Medical History:  Diagnosis Date   Asthma    Chronic back pain    Chronic leg pain    Bilateral   Chronic pelvic pain in female    Essential hypertension    Fibromyalgia    H/O cardiac catheterization    No significant CAD (only 20% LAD) 03/02/13 with normal LV function.   Headache    High cholesterol    Hypothyroidism    Mental disorder    OAB (overactive bladder) 05/21/2013   Obstructive sleep apnea    Noncompliant with CPAP due to claustophobia.   Panic attacks    Rectocele 05/21/2013   Seizures (HCC)    1 severe seizure 2015; has had "small ones" since including 9/18   Type 2 diabetes mellitus [facility])     Patient Active Problem List   Diagnosis Date Noted   Chronic migraine w/o aura, not intractable, w/o stat migr 11/20/2020   Diabetic neuropathy (HCC) 12/20/2019   Current moderate episode of major depressive disorder without prior episode (HCC) 12/20/2019   Myalgia 12/20/2019   SUI (stress urinary incontinence, female) 05/05/2018   Screening for colorectal cancer 05/05/2018   Encounter for well woman exam with routine gynecological exam 05/05/2018   Breast pain, right 05/05/2018   Insomnia 09/13/2017   Restless leg 09/13/2017   Superficial fungus infection of skin 05/04/2017   Bruising 10/14/2016   Chest pain 10/04/2016   Hypothyroidism 10/04/2016   Idiopathic intracranial hypertension 11/19/2015   Papilledema 09/08/2015   Left facial numbness 08/22/2015   Epilepsy, generalized,  convulsive (HCC) 06/05/2015   Obstructive sleep apnea 06/05/2015   Headache, occipital 06/05/2015   Neck pain 06/05/2015   Occipital neuralgia of right side 06/05/2015   Seizure (HCC) 07/21/2014   Rectocele 05/21/2013   OAB (overactive bladder) 05/21/2013   Difficulty in walking(719.7) 04/11/2013   Leg weakness, bilateral 04/11/2013   Diabetes (HCC) 02/28/2013   Morbid obesity (HCC) 02/28/2013   Hyperlipidemia 02/28/2013   CHRONIC OSTEOMYELITIS ANKLE AND FOOT 07/22/2009   Hypertension 07/22/2009    Past Surgical History:  Procedure Laterality Date   ABDOMINAL HYSTERECTOMY     ANKLE SURGERY     BACK SURGERY     CARPAL TUNNEL RELEASE     knee replacements     bilateral   LEFT HEART CATHETERIZATION WITH CORONARY ANGIOGRAM N/A 03/02/2013   Procedure: LEFT HEART CATHETERIZATION WITH CORONARY ANGIOGRAM;  Surgeon: Peter M Swaziland, MD;  Location: [facility] CATH LAB;  Service: Cardiovascular;  Laterality: N/A;   TONSILLECTOMY     TUBAL LIGATION       OB History     Gravida  2   Para  2   Term  2   Preterm      AB      Living  2      SAB      IAB      Ectopic  Multiple      Live Births  2           Family History  Problem Relation Age of Onset   Heart attack Mother    Diabetes Mother    Hypertension Mother    Sick sinus syndrome Brother        Pacemaker   Congenital heart disease Brother    Stroke Brother    Coronary artery disease Brother    Breast cancer Maternal Aunt    Cancer Cousin        leukemia    Social History   Tobacco Use   Smoking status: Former    Packs/day: 1.00    Years: 2.00    Pack years: 2.00    Types: Cigarettes    Quit date: 12/13/1974    Years since quitting: 46.9   Smokeless tobacco: Never  Vaping Use   Vaping Use: Never used  Substance Use Topics   Alcohol use: No   Drug use: No    Home Medications Prior to Admission medications   Medication Sig Start Date End Date Taking? Authorizing Provider  Ascorbic Acid  (VITAMIN C GUMMIES PO) Take 1 tablet by mouth daily.   Yes [provider]  atorvastatin (LIPITOR) 40 MG tablet Take 40 mg by mouth at bedtime.  10/15/14  Yes [provider]  chlorpheniramine-HYDROcodone (TUSSIONEX PENNKINETIC ER) 10-8 MG/5ML SUER Take 5 mLs by mouth every 12 (twelve) hours as needed for cough. 11/08/21  Yes Jacalyn Lefevre, MD  clobetasol (TEMOVATE) 0.05 % external solution Apply 1 application topically 2 (two) times daily as needed (to scalp for use at 1-2 weeks at one time).  04/04/18  Yes [provider]  dorzolamide-timolol (COSOPT) 22.3-6.8 MG/ML ophthalmic solution Place 1 drop into the left eye 2 (two) times daily. 03/12/21  Yes [provider]  DULoxetine (CYMBALTA) 60 MG capsule Take 1 capsule (60 mg total) by mouth 2 (two) times daily. TAKE ONE CAPSULE BY MOUTH EVERY MORNING and TAKE ONE TABLET BY MOUTH EVERY EVENING 08/19/21  Yes Lomax, [name], NP  Fluocinolone Acetonide Scalp 0.01 % OIL Apply 1 application topically daily as needed (applied to scalp for psoriasis).    Yes [provider]  Galcanezumab-gnlm (EMGALITY) 120 MG/ML SOAJ Inject 1 pen into the skin every 30 (thirty) days. 11/20/20  Yes Sater, Pearletha Furl, MD  ibuprofen (ADVIL,MOTRIN) 200 MG tablet Take 200-400 mg by mouth daily as needed for mild pain or moderate pain.    Yes [provider]  levETIRAcetam (KEPPRA) 500 MG tablet TAKE ONE TABLET BY MOUTH AT BREAKFAST AND AT BEDTIME 10/14/21  Yes Lomax, [name], NP  levothyroxine (SYNTHROID, LEVOTHROID) 100 MCG tablet Take 100 mcg by mouth every morning.  12/31/16  Yes [provider]  metFORMIN (GLUCOPHAGE-XR) 500 MG 24 hr tablet Take 500 mg by mouth 2 (two) times daily. 04/29/21  Yes [provider]  methocarbamol (ROBAXIN) 750 MG tablet Take 1 tablet (750 mg total) by mouth 3 (three) times daily as needed (muscle spasm/pain). 05/19/21  Yes Cathren Laine, MD  metoprolol (LOPRESSOR) 50 MG tablet Take 50 mg by  mouth 2 (two) times daily.   Yes [provider]  oxybutynin (DITROPAN) 5 MG tablet Take 1 tablet (5 mg total) by mouth 2 (two) times daily. 09/14/21  Yes Lazaro Arms, MD  vitamin B-12 (CYANOCOBALAMIN) 100 MCG tablet Take 100 mcg by mouth daily.   Yes [provider]  zonisamide (ZONEGRAN) 100 MG capsule Take 3  capsules (300 mg total) by mouth at bedtime. 11/20/20  Yes Sater, Pearletha Furl, MD  nystatin cream (MYCOSTATIN) Apply 1 application topically 2 (two) times daily as needed for dry skin. Patient not taking: Reported on 05/19/2021 10/07/19   Lazaro Arms, MD  prednisoLONE acetate (PRED FORTE) 1 % ophthalmic suspension Place 1 drop into the left eye 2 (two) times daily. Patient not taking: Reported on 11/08/2021 03/12/21   [provider]  traMADol (ULTRAM) 50 MG tablet Take 1 tablet (50 mg total) by mouth every 6 (six) hours as needed. Patient not taking: Reported on 11/08/2021 05/19/21   Cathren Laine, MD  triamcinolone cream (KENALOG) 0.1 % Apply 1 application topically 2 (two) times daily as needed (for irritation). Patient not taking: Reported on 05/19/2021 10/07/19   Lazaro Arms, MD    Allergies    Aspirin, Codeine, and Latex  Review of Systems   Review of Systems  Constitutional:  Positive for chills, fatigue and fever.  Respiratory:  Positive for cough.   Cardiovascular:  Positive for chest pain.  All other systems reviewed and are negative.  Physical Exam Updated Vital Signs BP 130/70   Pulse 66   Temp 99.2 F (37.3 C) (Oral)   Resp (!) 26   Ht 5\' 6"  (1.676 m)   Wt 99.8 kg   SpO2 98%   BMI 35.51 kg/m   Physical Exam Vitals and nursing note reviewed.  Constitutional:      Appearance: She is well-developed.  HENT:     Head: Normocephalic and atraumatic.  Eyes:     Extraocular Movements: Extraocular movements intact.     Pupils: Pupils are equal, round, and reactive to light.  Cardiovascular:     Rate and Rhythm: Normal rate and regular  rhythm.     Heart sounds: Normal heart sounds.  Pulmonary:     Effort: Pulmonary effort is normal.     Breath sounds: Normal breath sounds.  Abdominal:     General: Bowel sounds are normal.     Palpations: Abdomen is soft.  Musculoskeletal:        General: Normal range of motion.     Cervical back: Normal range of motion and neck supple.  Skin:    General: Skin is warm.     Capillary Refill: Capillary refill takes less than 2 seconds.  Neurological:     General: No focal deficit present.     Mental Status: She is alert and oriented to person, place, and time.  Psychiatric:        Mood and Affect: Mood normal.        Behavior: Behavior normal.    ED Results / Procedures / Treatments   Labs (all labs ordered are listed, but only abnormal results are displayed) Labs Reviewed  CBC - Abnormal; Notable for the following components:      Result Value   HCT 46.9 (*)    All other components within normal limits  RESP PANEL BY RT-PCR (FLU A&B, COVID) ARPGX2  BASIC METABOLIC PANEL  TROPONIN I (HIGH SENSITIVITY)  TROPONIN I (HIGH SENSITIVITY)    EKG EKG Interpretation  Date/Time:  Sunday November 08 2021 16:47:54 EST Ventricular Rate:  65 PR Interval:  179 QRS Duration: 88 QT Interval:  388 QTC Calculation: 404 R Axis:   0 Text Interpretation: Sinus rhythm Low voltage, precordial leads No significant change since last tracing Confirmed by Jacalyn Lefevre [phone]) on 11/08/2021 4:52:01 PM  Radiology DG Chest Portable 1 View  Result Date: 11/08/2021 CLINICAL DATA:  Left anterior chest pain radiating to the left arm. EXAM: PORTABLE CHEST 1 VIEW COMPARISON:  09/05/2019 FINDINGS: Cardiac silhouette is normal in size. No mediastinal or hilar masses. Prominent bronchial markings in the medial lung bases, chronic and stable. Lungs otherwise clear. No pleural effusion or pneumothorax. Skeletal structures are grossly intact. IMPRESSION: No active disease. Electronically Signed   By:  Amie Portland M.D.   On: 11/08/2021 17:28    Procedures Procedures   Medications Ordered in ED Medications  sodium chloride 0.9 % bolus 1,000 mL (0 mLs Intravenous Stopped 11/08/21 1831)  acetaminophen (TYLENOL) tablet 1,000 mg (1,000 mg Oral Given 11/08/21 1705)  chlorpheniramine-HYDROcodone (TUSSIONEX) 10-8 MG/5ML suspension 5 mL (5 mLs Oral Given 11/08/21 1835)    ED Course  I have reviewed the triage vital signs and the nursing notes.  Pertinent labs & imaging results that were available during my care of the patient were reviewed by me and considered in my medical decision making (see chart for details).    MDM Rules/Calculators/A&P                           Covid/flu neg.  I suspect pt either has a false negative flu or that she's caught RSV from her grand kids.  Pt's cardiac eval is neg.  CXR is clear.  Pt is nontoxic in appearance.  She is stable for d/c.  Return if worse.  F/u with pcp. Final Clinical Impression(s) / ED Diagnoses Final diagnoses:  Viral upper respiratory tract infection  Atypical chest pain    Rx / DC Orders ED Discharge Orders          Ordered    chlorpheniramine-HYDROcodone (TUSSIONEX PENNKINETIC ER) 10-8 MG/5ML SUER  Every 12 hours PRN        11/08/21 1947             Jacalyn Lefevre, MD 11/08/21 1947

## 2021-11-11 ENCOUNTER — Other Ambulatory Visit: Payer: Self-pay | Admitting: Neurology

## 2021-11-11 DIAGNOSIS — I10 Essential (primary) hypertension: Secondary | ICD-10-CM | POA: Diagnosis not present

## 2021-11-11 DIAGNOSIS — E1165 Type 2 diabetes mellitus with hyperglycemia: Secondary | ICD-10-CM | POA: Diagnosis not present

## 2021-11-11 DIAGNOSIS — G43709 Chronic migraine without aura, not intractable, without status migrainosus: Secondary | ICD-10-CM

## 2021-11-26 ENCOUNTER — Other Ambulatory Visit: Payer: Self-pay | Admitting: Neurology

## 2021-12-11 DIAGNOSIS — E1165 Type 2 diabetes mellitus with hyperglycemia: Secondary | ICD-10-CM | POA: Diagnosis not present

## 2021-12-11 DIAGNOSIS — I10 Essential (primary) hypertension: Secondary | ICD-10-CM | POA: Diagnosis not present

## 2021-12-15 ENCOUNTER — Emergency Department (HOSPITAL_COMMUNITY): Payer: Medicare Other

## 2021-12-15 ENCOUNTER — Emergency Department (HOSPITAL_COMMUNITY)
Admission: EM | Admit: 2021-12-15 | Discharge: 2021-12-15 | Disposition: A | Discharge status: Home / Self Care | Payer: Medicare Other | Attending: Emergency Medicine | Admitting: Emergency Medicine

## 2021-12-15 ENCOUNTER — Encounter (HOSPITAL_COMMUNITY): Payer: Self-pay | Admitting: Emergency Medicine

## 2021-12-15 DIAGNOSIS — E039 Hypothyroidism, unspecified: Secondary | ICD-10-CM | POA: Insufficient documentation

## 2021-12-15 DIAGNOSIS — I10 Essential (primary) hypertension: Secondary | ICD-10-CM | POA: Insufficient documentation

## 2021-12-15 DIAGNOSIS — Z79899 Other long term (current) drug therapy: Secondary | ICD-10-CM | POA: Diagnosis not present

## 2021-12-15 DIAGNOSIS — S20211A Contusion of right front wall of thorax, initial encounter: Secondary | ICD-10-CM | POA: Diagnosis not present

## 2021-12-15 DIAGNOSIS — S299XXA Unspecified injury of thorax, initial encounter: Secondary | ICD-10-CM | POA: Diagnosis present

## 2021-12-15 DIAGNOSIS — W010XXA Fall on same level from slipping, tripping and stumbling without subsequent striking against object, initial encounter: Secondary | ICD-10-CM | POA: Diagnosis not present

## 2021-12-15 DIAGNOSIS — R0781 Pleurodynia: Secondary | ICD-10-CM | POA: Diagnosis not present

## 2021-12-15 DIAGNOSIS — Z7984 Long term (current) use of oral hypoglycemic drugs: Secondary | ICD-10-CM | POA: Diagnosis not present

## 2021-12-15 DIAGNOSIS — Z91040 Latex allergy status: Secondary | ICD-10-CM | POA: Diagnosis not present

## 2021-12-15 DIAGNOSIS — E1140 Type 2 diabetes mellitus with diabetic neuropathy, unspecified: Secondary | ICD-10-CM | POA: Insufficient documentation

## 2021-12-15 MED ORDER — LIDOCAINE 5 % EX PTCH: 1.0000 | MEDICATED_PATCH | Freq: Every day | CUTANEOUS | 0 refills | Status: DC | PRN

## 2021-12-15 MED ORDER — ACETAMINOPHEN 325 MG PO TABS: 650.0000 mg | ORAL_TABLET | Freq: Four times a day (QID) | ORAL | 0 refills | Status: DC | PRN

## 2021-12-15 MED ORDER — HYDROCODONE-ACETAMINOPHEN 5-325 MG PO TABS
1.0000 | ORAL_TABLET | Freq: Once | ORAL | Status: AC
  Administered 2021-12-15: 1 via ORAL
  Filled 2021-12-15: qty 1

## 2021-12-15 MED ORDER — LIDOCAINE 5 % EX PTCH
1.0000 | MEDICATED_PATCH | CUTANEOUS | Status: DC
  Administered 2021-12-15: 1 via TRANSDERMAL
  Filled 2021-12-15: qty 1

## 2021-12-15 MED ORDER — METHOCARBAMOL 500 MG PO TABS: 1000.0000 mg | ORAL_TABLET | Freq: Two times a day (BID) | ORAL | 0 refills | Status: AC

## 2021-12-15 NOTE — ED Provider Notes (Signed)
[facility] EMERGENCY DEPARTMENT Provider Note   CSN: [id number] Arrival date & time: 12/15/21  1408     History  Chief Complaint  Patient presents with   [name] is a 66 y.o. female.  This is a 66 y.o. female with significant medical history as below, including hypothyroidism, DM MDD who presents to the ED with complaint of fall   Patient with mechanical fall 2 days ago, she tripped over her grandchild's shoe.  Fell onto her right side.  No head injury.  No LOC, no thinners.  Pain to right side of her chest wall since the fall.  Gradually worsening.  Worse with twisting or movement.  No pain to her back or abdomen.  No nausea or vomiting.  No  dyspnea.  Has not been taking medications for symptoms over the past few days.  Pain described as aching, sharp.  Relieved with lying on the right side.  No fevers or chills  The history is provided by the patient. No language interpreter was used.  Fall Associated symptoms include chest pain. Pertinent negatives include no abdominal pain, no headaches and no shortness of breath.  Patient Active Problem List   Diagnosis Date Noted   Chronic migraine w/o aura, not intractable, w/o stat migr 11/20/2020   Diabetic neuropathy (HCC) 12/20/2019   Current moderate episode of major depressive disorder without prior episode (HCC) 12/20/2019   Myalgia 12/20/2019   SUI (stress urinary incontinence, female) 05/05/2018   Screening for colorectal cancer 05/05/2018   Encounter for well woman exam with routine gynecological exam 05/05/2018   Breast pain, right 05/05/2018   Insomnia 09/13/2017   Restless leg 09/13/2017   Superficial fungus infection of skin 05/04/2017   Bruising 10/14/2016   Chest pain 10/04/2016   Hypothyroidism 10/04/2016   Idiopathic intracranial hypertension 11/19/2015   Papilledema 09/08/2015   Left facial numbness 08/22/2015   Epilepsy, generalized, convulsive (HCC) 06/05/2015   Obstructive sleep apnea 06/05/2015    Headache, occipital 06/05/2015   Neck pain 06/05/2015   Occipital neuralgia of right side 06/05/2015   Seizure (HCC) 07/21/2014   Rectocele 05/21/2013   OAB (overactive bladder) 05/21/2013   Difficulty in walking(719.7) 04/11/2013   Leg weakness, bilateral 04/11/2013   Diabetes (HCC) 02/28/2013   Morbid obesity (HCC) 02/28/2013   Hyperlipidemia 02/28/2013   CHRONIC OSTEOMYELITIS ANKLE AND FOOT 07/22/2009   Hypertension 07/22/2009       Home Medications Prior to Admission medications   Medication Sig Start Date End Date Taking? Authorizing Provider  acetaminophen (TYLENOL) 325 MG tablet Take 2 tablets (650 mg total) by mouth every 6 (six) hours as needed. 12/15/21  Yes Tanda Rockers A, DO  lidocaine (LIDODERM) 5 % Place 1 patch onto the skin daily as needed. Remove & Discard patch within 12 hours or as directed by MD 12/15/21  Yes Sloan Leiter, DO  methocarbamol (ROBAXIN) 500 MG tablet Take 2 tablets (1,000 mg total) by mouth 2 (two) times daily for 5 days. 12/15/21 12/20/21 Yes Sloan Leiter, DO  Ascorbic Acid (VITAMIN C GUMMIES PO) Take 1 tablet by mouth daily.    [provider]  atorvastatin (LIPITOR) 40 MG tablet Take 40 mg by mouth at bedtime.  10/15/14   [provider]  chlorpheniramine-HYDROcodone (TUSSIONEX PENNKINETIC ER) 10-8 MG/5ML SUER Take 5 mLs by mouth every 12 (twelve) hours as needed for cough. 11/08/21   Jacalyn Lefevre, MD  clobetasol (TEMOVATE) 0.05 % external solution Apply 1 application  topically 2 (two) times daily as needed (to scalp for use at 1-2 weeks at one time).  04/04/18   [provider]  dorzolamide-timolol (COSOPT) 22.3-6.8 MG/ML ophthalmic solution Place 1 drop into the left eye 2 (two) times daily. 03/12/21   [provider]  DULoxetine (CYMBALTA) 60 MG capsule Take 1 capsule (60 mg total) by mouth 2 (two) times daily. TAKE ONE CAPSULE BY MOUTH EVERY MORNING and TAKE ONE TABLET BY MOUTH EVERY EVENING 08/19/21   Lomax, [name], NP   EMGALITY 120 MG/ML SOAJ INJECT THE contents of ONE pen into THE SKIN every 30 DAYS AS DIRECTED 11/11/21   Sater, Pearletha Furl, MD  Fluocinolone Acetonide Scalp 0.01 % OIL Apply 1 application topically daily as needed (applied to scalp for psoriasis).     [provider]  ibuprofen (ADVIL,MOTRIN) 200 MG tablet Take 200-400 mg by mouth daily as needed for mild pain or moderate pain.     [provider]  levETIRAcetam (KEPPRA) 500 MG tablet TAKE ONE TABLET BY MOUTH AT BREAKFAST AND AT BEDTIME 10/14/21   Lomax, [name], NP  levothyroxine (SYNTHROID, LEVOTHROID) 100 MCG tablet Take 100 mcg by mouth every morning.  12/31/16   [provider]  metFORMIN (GLUCOPHAGE-XR) 500 MG 24 hr tablet Take 500 mg by mouth 2 (two) times daily. 04/29/21   [provider]  methocarbamol (ROBAXIN) 750 MG tablet Take 1 tablet (750 mg total) by mouth 3 (three) times daily as needed (muscle spasm/pain). 05/19/21   Cathren Laine, MD  metoprolol (LOPRESSOR) 50 MG tablet Take 50 mg by mouth 2 (two) times daily.    [provider]  nystatin cream (MYCOSTATIN) Apply 1 application topically 2 (two) times daily as needed for dry skin. Patient not taking: Reported on 05/19/2021 10/07/19   Lazaro Arms, MD  oxybutynin (DITROPAN) 5 MG tablet Take 1 tablet (5 mg total) by mouth 2 (two) times daily. 09/14/21   Lazaro Arms, MD  prednisoLONE acetate (PRED FORTE) 1 % ophthalmic suspension Place 1 drop into the left eye 2 (two) times daily. Patient not taking: Reported on 11/08/2021 03/12/21   [provider]  traMADol (ULTRAM) 50 MG tablet Take 1 tablet (50 mg total) by mouth every 6 (six) hours as needed. Patient not taking: Reported on 11/08/2021 05/19/21   Cathren Laine, MD  triamcinolone cream (KENALOG) 0.1 % Apply 1 application topically 2 (two) times daily as needed (for irritation). Patient not taking: Reported on 05/19/2021 10/07/19   Lazaro Arms, MD  vitamin B-12 (CYANOCOBALAMIN) 100 MCG  tablet Take 100 mcg by mouth daily.    [provider]  zonisamide (ZONEGRAN) 100 MG capsule Take 3 capsules (300 mg total) by mouth at bedtime. Must keep follow up on 01/25/22 for ongoing refills 11/26/21   Asa Lente, MD      Allergies    Aspirin, Codeine, and Latex    Review of Systems   Review of Systems  Constitutional:  Negative for activity change and fever.  HENT:  Negative for facial swelling and trouble swallowing.   Eyes:  Negative for discharge and redness.  Respiratory:  Negative for cough and shortness of breath.   Cardiovascular:  Positive for chest pain. Negative for palpitations.  Gastrointestinal:  Negative for abdominal pain and nausea.  Genitourinary:  Negative for dysuria and flank pain.  Musculoskeletal:  Positive for arthralgias. Negative for back pain and gait problem.  Skin:  Negative for pallor and rash.  Neurological:  Negative  for syncope and headaches.   Physical Exam Updated Vital Signs BP 121/82 (BP Location: Left Arm)    Pulse 81    Temp 98.4 F (36.9 C) (Oral)    Resp 17    SpO2 98%  Physical Exam Vitals and nursing note reviewed.  Constitutional:      General: She is not in acute distress.    Appearance: Normal appearance. She is obese.  HENT:     Head: Normocephalic and atraumatic.     Comments: Poor dentition    Right Ear: External ear normal.     Left Ear: External ear normal.     Nose: Nose normal.     Mouth/Throat:     Mouth: Mucous membranes are moist.  Eyes:     General: No scleral icterus.       Right eye: No discharge.        Left eye: No discharge.  Cardiovascular:     Rate and Rhythm: Normal rate and regular rhythm.     Pulses: Normal pulses.     Heart sounds: Normal heart sounds.  Pulmonary:     Effort: Pulmonary effort is normal. No respiratory distress.     Breath sounds: Normal breath sounds.  Chest:       Comments: No crepitus Abdominal:     General: Abdomen is flat.     Tenderness: There is no  abdominal tenderness.  Musculoskeletal:        General: Normal range of motion.     Cervical back: Normal range of motion.     Right lower leg: No edema.     Left lower leg: No edema.  Skin:    General: Skin is warm and dry.     Capillary Refill: Capillary refill takes less than 2 seconds.  Neurological:     Mental Status: She is alert and oriented to person, place, and time.     GCS: GCS eye subscore is 4. GCS verbal subscore is 5. GCS motor subscore is 6.  Psychiatric:        Mood and Affect: Mood normal.        Behavior: Behavior normal.    ED Results / Procedures / Treatments   Labs (all labs ordered are listed, but only abnormal results are displayed) Labs Reviewed - No data to display  EKG None  Radiology DG Ribs Unilateral W/Chest Right  Result Date: 12/15/2021 CLINICAL DATA:  Right rib pain after fall. EXAM: RIGHT RIBS AND CHEST - 3+ VIEW COMPARISON:  November 08, 2021. FINDINGS: No fracture or other bone lesions are seen involving the ribs. There is no evidence of pneumothorax or pleural effusion. Both lungs are clear. Heart size and mediastinal contours are within normal limits. IMPRESSION: Negative. Electronically Signed   By: Lupita Raider M.D.   On: 12/15/2021 16:27    Procedures Procedures    Medications Ordered in ED Medications  lidocaine (LIDODERM) 5 % 1 patch (1 patch Transdermal Patch Applied 12/15/21 1912)  HYDROcodone-acetaminophen (NORCO/VICODIN) 5-325 MG per tablet 1 tablet (1 tablet Oral Given 12/15/21 1910)    ED Course/ Medical Decision Making/ A&P                           Medical Decision Making   CC: Mechanical fall, right-sided pain  This patient complains of above; this involves an extensive number of treatment options and is a complaint that carries with it a high risk of complications  and morbidity. Vital signs were reviewed. Serious etiologies considered.  Record review:   Previous records obtained and reviewed    Work up as above,  notable for:  imaging results that were available during my care of the patient were reviewed by me and considered in my medical decision making.   I ordered imaging studies which included rib series x-ray and I independently visualized and interpreted imaging which showed no acute fracture  Management: Patient given analgesics, incentive spirometer  Reassessment:  Patient reports she is feeling better.  Patient w/ likely rib contusion on the right side.  No evidence acute fracture.  No pneumothorax.  Respiratory status is stable.  Feeling better after intervention.  Recommend discharge home with close outpatient PCP follow-up for likely rib contusion   The patient improved significantly and was discharged in stable condition. Detailed discussions were had with the patient regarding current findings, and need for close f/u with PCP or on call doctor. The patient has been instructed to return immediately if the symptoms worsen in any way for re-evaluation. Patient verbalized understanding and is in agreement with current care plan. All questions answered prior to discharge.           This chart was dictated using voice recognition software.  Despite best efforts to proofread,  errors can occur which can change the documentation meaning.         Final Clinical Impression(s) / ED Diagnoses Final diagnoses:  Contusion of rib on right side, initial encounter    Rx / DC Orders ED Discharge Orders          Ordered    methocarbamol (ROBAXIN) 500 MG tablet  2 times daily        12/15/21 1857    acetaminophen (TYLENOL) 325 MG tablet  Every 6 hours PRN        12/15/21 1857    lidocaine (LIDODERM) 5 %  Daily PRN        12/15/21 1857              Sloan Leiter, DO 12/16/21 1610

## 2021-12-15 NOTE — ED Triage Notes (Signed)
Fell 2 days ago, pain in right rib cage °

## 2021-12-15 NOTE — Discharge Instructions (Addendum)
Return to ED for any worsening or worrisome symptoms.  °

## 2021-12-15 NOTE — ED Notes (Signed)
Pt verbalized understanding of discharge instructions. Signature pad not currently working.

## 2021-12-17 ENCOUNTER — Telehealth: Payer: Self-pay | Admitting: *Deleted

## 2021-12-17 DIAGNOSIS — S2020XD Contusion of thorax, unspecified, subsequent encounter: Secondary | ICD-10-CM | POA: Diagnosis not present

## 2021-12-17 DIAGNOSIS — W19XXXD Unspecified fall, subsequent encounter: Secondary | ICD-10-CM | POA: Diagnosis not present

## 2021-12-17 NOTE — Telephone Encounter (Signed)
Received PA Emgality request on CMM. Key: [id number]  Tried submitting but received the following response: "This medication or product was previously approved on [id number] from 2021-12-13 to 2022-12-12. **Please note: This request was submitted electronically. Formulary lowering, tiering exception, cost reduction and/or pre-benefit determination review (including prospective Medicare hospice reviews) requests cannot be requested using this method of submission. Providers contact us at [phone] for further assistance."

## 2021-12-30 ENCOUNTER — Telehealth: Payer: Self-pay | Admitting: Neurology

## 2021-12-30 NOTE — Telephone Encounter (Signed)
LVM and sent mychart msg informing pt of r/s needed for 2/13 appt- Dr. Felecia Shelling out. If pt calls back, they can be offered 2/3.

## 2022-01-12 DIAGNOSIS — E1165 Type 2 diabetes mellitus with hyperglycemia: Secondary | ICD-10-CM | POA: Diagnosis not present

## 2022-01-12 DIAGNOSIS — I10 Essential (primary) hypertension: Secondary | ICD-10-CM | POA: Diagnosis not present

## 2022-01-13 ENCOUNTER — Other Ambulatory Visit: Payer: Self-pay | Admitting: Neurology

## 2022-01-13 DIAGNOSIS — E039 Hypothyroidism, unspecified: Secondary | ICD-10-CM | POA: Diagnosis not present

## 2022-01-13 DIAGNOSIS — I10 Essential (primary) hypertension: Secondary | ICD-10-CM | POA: Diagnosis not present

## 2022-01-13 DIAGNOSIS — E119 Type 2 diabetes mellitus without complications: Secondary | ICD-10-CM | POA: Diagnosis not present

## 2022-01-13 MED ORDER — ZONISAMIDE 100 MG PO CAPS: 300.0000 mg | ORAL_CAPSULE | Freq: Every day | ORAL | 0 refills | Status: DC

## 2022-01-13 NOTE — Addendum Note (Signed)
Addended by: Melanee Spry on: 01/13/2022 09:19 AM   Modules accepted: Orders

## 2022-01-18 ENCOUNTER — Other Ambulatory Visit (HOSPITAL_COMMUNITY): Payer: Self-pay | Admitting: Family Medicine

## 2022-01-18 DIAGNOSIS — Z1231 Encounter for screening mammogram for malignant neoplasm of breast: Secondary | ICD-10-CM

## 2022-01-18 DIAGNOSIS — I10 Essential (primary) hypertension: Secondary | ICD-10-CM | POA: Diagnosis not present

## 2022-01-18 DIAGNOSIS — R569 Unspecified convulsions: Secondary | ICD-10-CM | POA: Diagnosis not present

## 2022-01-18 DIAGNOSIS — Z1239 Encounter for other screening for malignant neoplasm of breast: Secondary | ICD-10-CM | POA: Diagnosis not present

## 2022-01-18 DIAGNOSIS — E782 Mixed hyperlipidemia: Secondary | ICD-10-CM | POA: Diagnosis not present

## 2022-01-18 DIAGNOSIS — E785 Hyperlipidemia, unspecified: Secondary | ICD-10-CM | POA: Diagnosis not present

## 2022-01-18 DIAGNOSIS — G629 Polyneuropathy, unspecified: Secondary | ICD-10-CM | POA: Diagnosis not present

## 2022-01-18 DIAGNOSIS — E039 Hypothyroidism, unspecified: Secondary | ICD-10-CM | POA: Diagnosis not present

## 2022-01-18 DIAGNOSIS — G4733 Obstructive sleep apnea (adult) (pediatric): Secondary | ICD-10-CM | POA: Diagnosis not present

## 2022-01-18 DIAGNOSIS — Z1211 Encounter for screening for malignant neoplasm of colon: Secondary | ICD-10-CM | POA: Diagnosis not present

## 2022-01-18 DIAGNOSIS — Z13820 Encounter for screening for osteoporosis: Secondary | ICD-10-CM

## 2022-01-18 DIAGNOSIS — E119 Type 2 diabetes mellitus without complications: Secondary | ICD-10-CM | POA: Diagnosis not present

## 2022-01-20 ENCOUNTER — Encounter (INDEPENDENT_AMBULATORY_CARE_PROVIDER_SITE_OTHER): Payer: Self-pay | Admitting: *Deleted

## 2022-01-25 ENCOUNTER — Ambulatory Visit: Payer: Medicare Other | Admitting: Neurology

## 2022-02-01 ENCOUNTER — Ambulatory Visit (HOSPITAL_COMMUNITY): Payer: Commercial Managed Care - HMO

## 2022-02-04 ENCOUNTER — Ambulatory Visit (HOSPITAL_COMMUNITY): Payer: Commercial Managed Care - HMO

## 2022-02-09 DIAGNOSIS — I10 Essential (primary) hypertension: Secondary | ICD-10-CM | POA: Diagnosis not present

## 2022-02-09 DIAGNOSIS — E1165 Type 2 diabetes mellitus with hyperglycemia: Secondary | ICD-10-CM | POA: Diagnosis not present

## 2022-02-16 ENCOUNTER — Other Ambulatory Visit: Payer: Self-pay

## 2022-02-16 MED ORDER — LEVETIRACETAM 500 MG PO TABS: ORAL_TABLET | ORAL | 0 refills | Status: DC

## 2022-02-22 ENCOUNTER — Other Ambulatory Visit: Payer: Self-pay | Admitting: Neurology

## 2022-02-23 ENCOUNTER — Ambulatory Visit: Payer: Medicare Other | Admitting: Neurology

## 2022-03-01 DIAGNOSIS — M810 Age-related osteoporosis without current pathological fracture: Secondary | ICD-10-CM | POA: Diagnosis not present

## 2022-03-02 ENCOUNTER — Ambulatory Visit: Payer: Medicare Other | Admitting: Neurology

## 2022-03-12 DIAGNOSIS — E1165 Type 2 diabetes mellitus with hyperglycemia: Secondary | ICD-10-CM | POA: Diagnosis not present

## 2022-03-12 DIAGNOSIS — I10 Essential (primary) hypertension: Secondary | ICD-10-CM | POA: Diagnosis not present

## 2022-03-25 ENCOUNTER — Other Ambulatory Visit: Payer: Self-pay | Admitting: Neurology

## 2022-04-03 ENCOUNTER — Other Ambulatory Visit: Payer: Self-pay

## 2022-04-03 ENCOUNTER — Emergency Department (HOSPITAL_COMMUNITY)
Admission: EM | Admit: 2022-04-03 | Discharge: 2022-04-03 | Disposition: A | Discharge status: Home / Self Care | Payer: Medicare Other | Attending: Emergency Medicine | Admitting: Emergency Medicine

## 2022-04-03 ENCOUNTER — Encounter (HOSPITAL_COMMUNITY): Payer: Self-pay

## 2022-04-03 DIAGNOSIS — Z91040 Latex allergy status: Secondary | ICD-10-CM | POA: Insufficient documentation

## 2022-04-03 DIAGNOSIS — W268XXA Contact with other sharp object(s), not elsewhere classified, initial encounter: Secondary | ICD-10-CM | POA: Insufficient documentation

## 2022-04-03 DIAGNOSIS — Z23 Encounter for immunization: Secondary | ICD-10-CM | POA: Diagnosis not present

## 2022-04-03 DIAGNOSIS — S61411A Laceration without foreign body of right hand, initial encounter: Secondary | ICD-10-CM | POA: Insufficient documentation

## 2022-04-03 DIAGNOSIS — S6991XA Unspecified injury of right wrist, hand and finger(s), initial encounter: Secondary | ICD-10-CM | POA: Diagnosis present

## 2022-04-03 MED ORDER — LIDOCAINE HCL (PF) 1 % IJ SOLN
5.0000 mL | Freq: Once | INTRAMUSCULAR | Status: AC
  Administered 2022-04-03: 5 mL
  Filled 2022-04-03: qty 5

## 2022-04-03 MED ORDER — BACITRACIN ZINC 500 UNIT/GM EX OINT
TOPICAL_OINTMENT | CUTANEOUS | Status: AC
  Filled 2022-04-03: qty 0.9

## 2022-04-03 MED ORDER — TETANUS-DIPHTH-ACELL PERTUSSIS 5-2.5-18.5 LF-MCG/0.5 IM SUSY
0.5000 mL | PREFILLED_SYRINGE | Freq: Once | INTRAMUSCULAR | Status: AC
  Administered 2022-04-03: 0.5 mL via INTRAMUSCULAR
  Filled 2022-04-03: qty 0.5

## 2022-04-03 NOTE — Discharge Instructions (Signed)
Tylenol or ibuprofen for pain ? ?You will need to have the 4 stitches removed within the next 10 days ? ?Emergency department for severe worsening pain swelling drainage or fever ?

## 2022-04-03 NOTE — ED Provider Notes (Signed)
?[facility] EMERGENCY DEPARTMENT ?Provider Note ? ? ?CSN: [id number] ?Arrival date & time: 04/03/22  1426 ? ?  ? ?History ? ?Chief Complaint  ?Patient presents with  ? Laceration  ? ? ?[name] is a 66 y.o. female. ? ? ?Laceration ? ?This patient is a very pleasant 66 year old female presenting to the hospital today with a complaint of having a laceration to the ulnar aspect of the right hand over the hypothenar eminence.  This occurred when she was reaching under her bed to clean out some toys and found a razor blade that accidentally sliced her hand.  She is not sure about her last tetanus update, this was irrigated copiously on arrival.  This occurred just prior to arrival. ? ?Home Medications ?Prior to Admission medications   ?Medication Sig Start Date End Date Taking? Authorizing Provider  ?acetaminophen (TYLENOL) 325 MG tablet Take 2 tablets (650 mg total) by mouth every 6 (six) hours as needed. 12/15/21   Sloan Leiter, DO  ?Ascorbic Acid (VITAMIN C GUMMIES PO) Take 1 tablet by mouth daily.    [provider]  ?atorvastatin (LIPITOR) 40 MG tablet Take 40 mg by mouth at bedtime.  10/15/14   [provider]  ?chlorpheniramine-HYDROcodone (TUSSIONEX PENNKINETIC ER) 10-8 MG/5ML SUER Take 5 mLs by mouth every 12 (twelve) hours as needed for cough. 11/08/21   Jacalyn Lefevre, MD  ?clobetasol (TEMOVATE) 0.05 % external solution Apply 1 application topically 2 (two) times daily as needed (to scalp for use at 1-2 weeks at one time).  04/04/18   [provider]  ?dorzolamide-timolol (COSOPT) 22.3-6.8 MG/ML ophthalmic solution Place 1 drop into the left eye 2 (two) times daily. 03/12/21   [provider]  ?DULoxetine (CYMBALTA) 60 MG capsule Take 1 capsule (60 mg total) by mouth 2 (two) times daily. TAKE ONE CAPSULE BY MOUTH EVERY MORNING and TAKE ONE TABLET BY MOUTH EVERY EVENING 08/19/21   Lomax, [name], NP  ?EMGALITY 120 MG/ML SOAJ INJECT THE contents of ONE pen into THE SKIN every 30  DAYS AS DIRECTED 11/11/21   Sater, Pearletha Furl, MD  ?Fluocinolone Acetonide Scalp 0.01 % OIL Apply 1 application topically daily as needed (applied to scalp for psoriasis).     [provider]  ?ibuprofen (ADVIL,MOTRIN) 200 MG tablet Take 200-400 mg by mouth daily as needed for mild pain or moderate pain.     [provider]  ?levETIRAcetam (KEPPRA) 500 MG tablet TAKE ONE TABLET BY MOUTH AT [facility] AND AT BEDTIME 02/16/22   Sater, Pearletha Furl, MD  ?levothyroxine (SYNTHROID, LEVOTHROID) 100 MCG tablet Take 100 mcg by mouth every morning.  12/31/16   [provider]  ?lidocaine (LIDODERM) 5 % Place 1 patch onto the skin daily as needed. Remove & Discard patch within 12 hours or as directed by MD 12/15/21   Sloan Leiter, DO  ?metFORMIN (GLUCOPHAGE-XR) 500 MG 24 hr tablet Take 500 mg by mouth 2 (two) times daily. 04/29/21   [provider]  ?methocarbamol (ROBAXIN) 750 MG tablet Take 1 tablet (750 mg total) by mouth 3 (three) times daily as needed (muscle spasm/pain). 05/19/21   Cathren Laine, MD  ?metoprolol (LOPRESSOR) 50 MG tablet Take 50 mg by mouth 2 (two) times daily.    [provider]  ?nystatin cream (MYCOSTATIN) Apply 1 application topically 2 (two) times daily as needed for dry skin. ?Patient not taking: Reported on 05/19/2021 10/07/19   Lazaro Arms, MD  ?oxybutynin (DITROPAN) 5 MG tablet  Take 1 tablet (5 mg total) by mouth 2 (two) times daily. 09/14/21   Lazaro Arms, MD  ?prednisoLONE acetate (PRED FORTE) 1 % ophthalmic suspension Place 1 drop into the left eye 2 (two) times daily. ?Patient not taking: Reported on 11/08/2021 03/12/21   [provider]  ?traMADol (ULTRAM) 50 MG tablet Take 1 tablet (50 mg total) by mouth every 6 (six) hours as needed. ?Patient not taking: Reported on 11/08/2021 05/19/21   Cathren Laine, MD  ?triamcinolone cream (KENALOG) 0.1 % Apply 1 application topically 2 (two) times daily as needed (for irritation). ?Patient not taking:  Reported on 05/19/2021 10/07/19   Lazaro Arms, MD  ?vitamin B-12 (CYANOCOBALAMIN) 100 MCG tablet Take 100 mcg by mouth daily.    [provider]  ?zonisamide (ZONEGRAN) 100 MG capsule TAKE THREE CAPSULES BY MOUTH EVERYDAY AT BEDTIME 02/24/22   Sater, Pearletha Furl, MD  ?   ? ?Allergies    ?Aspirin, Codeine, and Latex   ? ?Review of Systems   ?Review of Systems  ?Skin:  Positive for wound.  ?Neurological:  Negative for numbness.  ? ?Physical Exam ?Updated Vital Signs ?BP 119/62 (BP Location: Left Arm)   Pulse (!) 58   Temp 97.8 ?F (36.6 ?C) (Oral)   Resp 20   Ht 1.676 m ( )   Wt 99.8 kg   SpO2 100%   BMI 35.51 kg/m?  ?Physical Exam ?Vitals and nursing note reviewed.  ?Constitutional:   ?   Appearance: She is well-developed. She is not diaphoretic.  ?HENT:  ?   Head: Normocephalic and atraumatic.  ?Eyes:  ?   General:     ?   Right eye: No discharge.     ?   Left eye: No discharge.  ?   Conjunctiva/sclera: Conjunctivae normal.  ?Pulmonary:  ?   Effort: Pulmonary effort is normal. No respiratory distress.  ?Musculoskeletal:     ?   General: Tenderness present.  ?   Right lower leg: No edema.  ?   Left lower leg: No edema.  ?   Comments: There is a 3 cm laceration to the right lateral hand, linear, mild bleeding, no foreign bodies on inspection  ?Skin: ?   General: Skin is warm and dry.  ?   Findings: No erythema or rash.  ?Neurological:  ?   Mental Status: She is alert.  ?   Coordination: Coordination normal.  ? ? ?ED Results / Procedures / Treatments   ?Labs ?(all labs ordered are listed, but only abnormal results are displayed) ?Labs Reviewed - No data to display ? ?EKG ?None ? ?Radiology ?No results found. ? ?Procedures ?Marland Kitchen.Laceration Repair ? ?Date/Time: 04/03/2022 2:44 PM ?Performed by: Eber Hong, MD ?Authorized by: Eber Hong, MD  ? ?Consent:  ?  Consent obtained:  Verbal ?  Consent given by:  Patient ?  Risks discussed:  Infection, pain, need for additional repair, poor cosmetic result and  poor wound healing ?  Alternatives discussed:  No treatment and delayed treatment ?Universal protocol:  ?  Immediately prior to procedure, a time out was called: yes   ?  Patient identity confirmed:  Verbally with patient ?Anesthesia:  ?  Anesthesia method:  Local infiltration ?  Local anesthetic:  Lidocaine 1% w/o epi ?Laceration details:  ?  Location:  Hand ?  Hand location: Right hand over lateral surface. ?  Length (cm):  3 ?  Depth (mm):  3 ?Pre-procedure details:  ?  Preparation:  Patient was prepped and draped in usual sterile fashion and imaging obtained to evaluate for foreign bodies ?Exploration:  ?  Limited defect created (wound extended): no   ?  Hemostasis achieved with:  Direct pressure ?  Wound exploration: wound explored through full range of motion   ?  Wound extent: no fascia violation noted, no foreign bodies/material noted, no muscle damage noted, no nerve damage noted, no tendon damage noted, no underlying fracture noted and no vascular damage noted   ?  Contaminated: no   ?Treatment:  ?  Area cleansed with:  Betadine ?  Amount of cleaning:  Standard ?  Irrigation solution:  Sterile saline ?  Irrigation method:  Syringe ?  Debridement:  None ?  Undermining:  None ?  Scar revision: no   ?Skin repair:  ?  Repair method:  Sutures ?  Suture size:  5-0 ?  Suture material:  Nylon ?  Suture technique:  Simple interrupted ?  Number of sutures:  4 ?Approximation:  ?  Approximation:  Close ?Repair type:  ?  Repair type:  Simple ?Post-procedure details:  ?  Dressing:  Antibiotic ointment and sterile dressing ?  Procedure completion:  Tolerated well, no immediate complications ?Comments:  ?    ?  ? ? ?Medications Ordered in ED ?Medications  ?Tdap (BOOSTRIX) injection 0.5 mL (has no administration in time range)  ?lidocaine (PF) (XYLOCAINE) 1 % injection 5 mL (has no administration in time range)  ? ? ?ED Course/ Medical Decision Making/ A&P ?  ?                        ?Medical Decision  Making ?Risk ?Prescription drug management. ? ? ?Laceration, simple repair ?Tetanus updated ?Explored with no signs of foreign body ?See exploration and wound note above ?Sterile dressing placed ?Stable for discharge ? ? ? ? ? ? ? ?Fi

## 2022-04-03 NOTE — ED Triage Notes (Signed)
Reports cut right hand with razor blade. Was cleaning toys out from under a stand and didn't know razor blade was under the stand.  ?

## 2022-04-11 DIAGNOSIS — I10 Essential (primary) hypertension: Secondary | ICD-10-CM | POA: Diagnosis not present

## 2022-04-11 DIAGNOSIS — E119 Type 2 diabetes mellitus without complications: Secondary | ICD-10-CM | POA: Diagnosis not present

## 2022-04-11 DIAGNOSIS — E039 Hypothyroidism, unspecified: Secondary | ICD-10-CM | POA: Diagnosis not present

## 2022-04-11 DIAGNOSIS — E782 Mixed hyperlipidemia: Secondary | ICD-10-CM | POA: Diagnosis not present

## 2022-04-14 DIAGNOSIS — T1490XD Injury, unspecified, subsequent encounter: Secondary | ICD-10-CM | POA: Diagnosis not present

## 2022-04-14 DIAGNOSIS — M069 Rheumatoid arthritis, unspecified: Secondary | ICD-10-CM | POA: Diagnosis not present

## 2022-04-14 DIAGNOSIS — Z4802 Encounter for removal of sutures: Secondary | ICD-10-CM | POA: Diagnosis not present

## 2022-04-16 ENCOUNTER — Emergency Department (HOSPITAL_COMMUNITY): Payer: Medicare Other

## 2022-04-16 ENCOUNTER — Emergency Department (HOSPITAL_COMMUNITY)
Admission: EM | Admit: 2022-04-16 | Discharge: 2022-04-16 | Disposition: A | Discharge status: Home / Self Care | Payer: Medicare Other | Attending: Emergency Medicine | Admitting: Emergency Medicine

## 2022-04-16 ENCOUNTER — Other Ambulatory Visit: Payer: Self-pay

## 2022-04-16 ENCOUNTER — Encounter (HOSPITAL_COMMUNITY): Payer: Self-pay

## 2022-04-16 DIAGNOSIS — Z91040 Latex allergy status: Secondary | ICD-10-CM | POA: Insufficient documentation

## 2022-04-16 DIAGNOSIS — W208XXA Other cause of strike by thrown, projected or falling object, initial encounter: Secondary | ICD-10-CM | POA: Insufficient documentation

## 2022-04-16 DIAGNOSIS — M7989 Other specified soft tissue disorders: Secondary | ICD-10-CM | POA: Diagnosis not present

## 2022-04-16 DIAGNOSIS — M79672 Pain in left foot: Secondary | ICD-10-CM

## 2022-04-16 MED ORDER — KETOROLAC TROMETHAMINE 60 MG/2ML IM SOLN
15.0000 mg | Freq: Once | INTRAMUSCULAR | Status: AC
  Administered 2022-04-16: 15 mg via INTRAMUSCULAR
  Filled 2022-04-16: qty 2

## 2022-04-16 MED ORDER — OXYCODONE-ACETAMINOPHEN 5-325 MG PO TABS
1.0000 | ORAL_TABLET | Freq: Once | ORAL | Status: DC
  Filled 2022-04-16: qty 1

## 2022-04-16 NOTE — ED Triage Notes (Signed)
Pov from  home. Cc of left foot pain after dropping a box on it earlier in the night. Has bruising and swelling.  ?

## 2022-04-16 NOTE — ED Provider Notes (Signed)
?[facility] EMERGENCY DEPARTMENT ?Provider Note ? ? ?CSN: [id number] ?Arrival date & time: 04/16/22  0032 ? ?  ? ?History ? ?Chief Complaint  ?Patient presents with  ? Foot Pain  ? ? ?[name] is a 66 y.o. female. ? ?Dropped a box on her left foot with pain in her big toe and associated metatarsal. No pain elsewhere. Developed bruising but no other symptoms.  ? ? ?Foot Pain ? ? ?  ? ?Home Medications ?Prior to Admission medications   ?Medication Sig Start Date End Date Taking? Authorizing Provider  ?acetaminophen (TYLENOL) 325 MG tablet Take 2 tablets (650 mg total) by mouth every 6 (six) hours as needed. 12/15/21   Sloan Leiter, DO  ?Ascorbic Acid (VITAMIN C GUMMIES PO) Take 1 tablet by mouth daily.    [provider]  ?atorvastatin (LIPITOR) 40 MG tablet Take 40 mg by mouth at bedtime.  10/15/14   [provider]  ?chlorpheniramine-HYDROcodone (TUSSIONEX PENNKINETIC ER) 10-8 MG/5ML SUER Take 5 mLs by mouth every 12 (twelve) hours as needed for cough. 11/08/21   Jacalyn Lefevre, MD  ?clobetasol (TEMOVATE) 0.05 % external solution Apply 1 application topically 2 (two) times daily as needed (to scalp for use at 1-2 weeks at one time).  04/04/18   [provider]  ?dorzolamide-timolol (COSOPT) 22.3-6.8 MG/ML ophthalmic solution Place 1 drop into the left eye 2 (two) times daily. 03/12/21   [provider]  ?DULoxetine (CYMBALTA) 60 MG capsule Take 1 capsule (60 mg total) by mouth 2 (two) times daily. TAKE ONE CAPSULE BY MOUTH EVERY MORNING and TAKE ONE TABLET BY MOUTH EVERY EVENING 08/19/21   Lomax, [name], NP  ?EMGALITY 120 MG/ML SOAJ INJECT THE contents of ONE pen into THE SKIN every 30 DAYS AS DIRECTED 11/11/21   Sater, Pearletha Furl, MD  ?Fluocinolone Acetonide Scalp 0.01 % OIL Apply 1 application topically daily as needed (applied to scalp for psoriasis).     [provider]  ?ibuprofen (ADVIL,MOTRIN) 200 MG tablet Take 200-400 mg by mouth daily as needed for mild pain or  moderate pain.     [provider]  ?levETIRAcetam (KEPPRA) 500 MG tablet TAKE ONE TABLET BY MOUTH AT [facility] AND AT BEDTIME 02/16/22   Sater, Pearletha Furl, MD  ?levothyroxine (SYNTHROID, LEVOTHROID) 100 MCG tablet Take 100 mcg by mouth every morning.  12/31/16   [provider]  ?lidocaine (LIDODERM) 5 % Place 1 patch onto the skin daily as needed. Remove & Discard patch within 12 hours or as directed by MD 12/15/21   Sloan Leiter, DO  ?metFORMIN (GLUCOPHAGE-XR) 500 MG 24 hr tablet Take 500 mg by mouth 2 (two) times daily. 04/29/21   [provider]  ?methocarbamol (ROBAXIN) 750 MG tablet Take 1 tablet (750 mg total) by mouth 3 (three) times daily as needed (muscle spasm/pain). 05/19/21   Cathren Laine, MD  ?metoprolol (LOPRESSOR) 50 MG tablet Take 50 mg by mouth 2 (two) times daily.    [provider]  ?nystatin cream (MYCOSTATIN) Apply 1 application topically 2 (two) times daily as needed for dry skin. ?Patient not taking: Reported on 05/19/2021 10/07/19   Lazaro Arms, MD  ?oxybutynin (DITROPAN) 5 MG tablet Take 1 tablet (5 mg total) by mouth 2 (two) times daily. 09/14/21   Lazaro Arms, MD  ?prednisoLONE acetate (PRED FORTE) 1 % ophthalmic suspension Place 1 drop into the left eye 2 (two) times daily. ?Patient not taking: Reported on 11/08/2021 03/12/21  [provider]  ?traMADol (ULTRAM) 50 MG tablet Take 1 tablet (50 mg total) by mouth every 6 (six) hours as needed. ?Patient not taking: Reported on 11/08/2021 05/19/21   Cathren LaineSteinl, Kevin, MD  ?triamcinolone cream (KENALOG) 0.1 % Apply 1 application topically 2 (two) times daily as needed (for irritation). ?Patient not taking: Reported on 05/19/2021 10/07/19   Lazaro ArmsEure, Luther H, MD  ?vitamin B-12 (CYANOCOBALAMIN) 100 MCG tablet Take 100 mcg by mouth daily.    [provider]  ?zonisamide (ZONEGRAN) 100 MG capsule TAKE THREE CAPSULES BY MOUTH EVERYDAY AT BEDTIME 02/24/22   Sater, Pearletha Furlichard A, MD  ?   ? ?Allergies    ?Aspirin,  Codeine, and Latex   ? ?Review of Systems   ?Review of Systems ? ?Physical Exam ?Updated Vital Signs ?BP 133/79   Pulse (!) 56   Temp 98.3 ?F (36.8 ?C)   Resp 20   Ht 5\' 6"  (1.676 m)   Wt 99.8 kg   BMI 35.51 kg/m?  ?Physical Exam ?Vitals and nursing note reviewed.  ?Constitutional:   ?   Appearance: She is well-developed.  ?HENT:  ?   Head: Normocephalic and atraumatic.  ?   Nose: Nose normal. No congestion or rhinorrhea.  ?   Mouth/Throat:  ?   Mouth: Mucous membranes are moist.  ?   Pharynx: Oropharynx is clear.  ?Eyes:  ?   Pupils: Pupils are equal, round, and reactive to light.  ?Cardiovascular:  ?   Rate and Rhythm: Normal rate and regular rhythm.  ?   Comments: Decreased pulses and cap refill symmetrically ?Pulmonary:  ?   Effort: No respiratory distress.  ?   Breath sounds: No stridor.  ?Abdominal:  ?   General: Abdomen is flat. There is no distension.  ?Musculoskeletal:     ?   General: Swelling and tenderness (left big toe associated with ecchymosis and ttp) present. Normal range of motion.  ?   Cervical back: Normal range of motion.  ?Skin: ?   General: Skin is warm and dry.  ?Neurological:  ?   General: No focal deficit present.  ?   Mental Status: She is alert.  ? ? ?ED Results / Procedures / Treatments   ?Labs ?(all labs ordered are listed, but only abnormal results are displayed) ?Labs Reviewed - No data to display ? ?EKG ?None ? ?Radiology ?DG Foot Complete Left ? ?Result Date: 04/16/2022 ?CLINICAL DATA:  Left foot trauma.  Bruising and swelling. EXAM: LEFT FOOT - COMPLETE 3+ VIEW COMPARISON:  Study of 08/01/2016. FINDINGS: There is no evidence of fracture or dislocation. There is no evidence of arthropathy or acute bone abnormality. There is mild soft swelling. Osseous alignment is unremarkable apart from mild hallux valgus. There is minimal plantar and posterior calcaneal enthesopathic spurring, and 2 orthopedic screws entering from anteriorly into the distal fibular diametaphysis. IMPRESSION:  Soft tissue swelling without evidence of fractures. Comparison to the prior study reveals no significant interval change. Electronically Signed   By: Almira BarKeith  Chesser M.D.   On: 04/16/2022 01:55   ? ?Procedures ?Procedures  ? ? ?Medications Ordered in ED ?Medications  ?oxyCODONE-acetaminophen (PERCOCET/ROXICET) 5-325 MG per tablet 1 tablet (has no administration in time range)  ?ketorolac (TORADOL) injection 15 mg (15 mg Intramuscular Given 04/16/22 0223)  ? ? ?ED Course/ Medical Decision Making/ A&P ?  ?                        ?Medical  Decision Making ?Amount and/or Complexity of Data Reviewed ?Radiology: ordered. ? ?Risk ?Prescription drug management. ? ? ?Bruising to toe and foot doubt acute circulatory problem. Xr negative. Post op shoe provided. Stable for discharge.  ? ? ?Final Clinical Impression(s) / ED Diagnoses ?Final diagnoses:  ?None  ? ? ?Rx / DC Orders ?ED Discharge Orders   ? ? None  ? ?  ? ? ?  ?Phuong Hillary, Barbara Cower, MD ?04/16/22 0225 ? ?

## 2022-04-22 ENCOUNTER — Other Ambulatory Visit: Payer: Self-pay | Admitting: Neurology

## 2022-04-23 ENCOUNTER — Other Ambulatory Visit: Payer: Self-pay | Admitting: Neurology

## 2022-04-28 DIAGNOSIS — H04122 Dry eye syndrome of left lacrimal gland: Secondary | ICD-10-CM | POA: Diagnosis not present

## 2022-05-11 ENCOUNTER — Encounter: Payer: Self-pay | Admitting: Neurology

## 2022-05-11 ENCOUNTER — Ambulatory Visit (INDEPENDENT_AMBULATORY_CARE_PROVIDER_SITE_OTHER): Payer: Medicare Other | Admitting: Neurology

## 2022-05-11 VITALS — BP 138/82 | HR 53 | Ht 66.0 in | Wt 222.0 lb

## 2022-05-11 DIAGNOSIS — H2012 Chronic iridocyclitis, left eye: Secondary | ICD-10-CM | POA: Diagnosis not present

## 2022-05-11 DIAGNOSIS — E1142 Type 2 diabetes mellitus with diabetic polyneuropathy: Secondary | ICD-10-CM | POA: Diagnosis not present

## 2022-05-11 DIAGNOSIS — G629 Polyneuropathy, unspecified: Secondary | ICD-10-CM | POA: Diagnosis not present

## 2022-05-11 DIAGNOSIS — G40309 Generalized idiopathic epilepsy and epileptic syndromes, not intractable, without status epilepticus: Secondary | ICD-10-CM

## 2022-05-11 DIAGNOSIS — G43709 Chronic migraine without aura, not intractable, without status migrainosus: Secondary | ICD-10-CM

## 2022-05-11 MED ORDER — ZONISAMIDE 100 MG PO CAPS: ORAL_CAPSULE | ORAL | 12 refills | Status: DC

## 2022-05-11 MED ORDER — EMGALITY 120 MG/ML ~~LOC~~ SOAJ: SUBCUTANEOUS | 12 refills | Status: DC

## 2022-05-11 MED ORDER — LEVETIRACETAM 500 MG PO TABS: ORAL_TABLET | ORAL | 11 refills | Status: DC

## 2022-05-11 NOTE — Progress Notes (Signed)
[facility]  PATIENT: [name] DOB: [birth year]  REFERRING DOCTOR OR PCP:  Meade Maw SOURCE: patient and records form EMR and Dr. Nevada Crane  _________________________________   HISTORICAL  CHIEF COMPLAINT:  Chief Complaint  Patient presents with   Follow-up    Pt with cousin, rm 2. Presents today for follow up. Last visit was 11/2020. Pt states that she still having headaches. She was on emgality and when on that she felt it did help the headaches and neck pain. She has been off for 3 mths. May 17 th she had her last dose of keppra and zonisamide and needs refills on these. Denies any seizures.     HISTORY OF PRESENT ILLNESS:  [name] is a 66 year old woman with a h/o a generalized tonic-clonic seizure in 2016 and possible other seizures who also has neck pain,  headaches and obstructive sleep apnea.  Update 05/11/22: She has not had any definite seizure but had a spell with altered awareness and went to the hospital. She had incontinence but no GTC. She was arguing with family t the time. .    She thinks she tolerates the medications well.     She has some headache about 28/30 days a month and many have migrainous features.  She was on Ajovy and it helped reduce the intensity, though they were still most days.    She last took 2-3 months ago.    She has headache , mostly on the top of the head and the occiput.   Holding head up and bright lights worsen the pain.   Sleep helps.   [facility] rooms and staying still helps.    NSAIDs don't help much and pain returns.   She has had the headache x 1 week.  In the past, ONB/TPI only helped that day.  She takes zonisamide 200 mg nightly and Keppra 500 mg po bid for HA and seizure.     In 2018, MRI cervical showed DJD at C6-C7 without spinal stenosis but there was no nerve root compression.   Splenius capitus TPI/GON injection did not help in past.    She is falling some due to poor balance.    She feels bladder  function has not changed - She notes improved depression until HA returned.  She has a lot of family stress (takes care of 2 great grand-kids and a foster child and granddaughter lives with her).     She had iritis OD.     She is on drops.  She also has OSA and uses CPAP nightly.   She does not sleep well as her granddaughter works evenings until 1-2 am and she needs to get her 45 yo foster child to school early in the morning.    She has diabetes and notes some numbness in her toes.  This is less troublesome than her other issues.  Seizure history In August 2015, she had generalized tonic-clonic seizure activity that was witnessed by her husband. She had no prior seizures and  An EEG was performed and showed 2 runs of generalized spike wave activity, consistent with a generalized seizure disorder. She is unaware of any family history of seizures.    She has not had head trauma in the past. She had a normal childhood development. She has not had any meningitis or encephalitis.  Pseudotumor cerebri/headache and neck pain:   Due to headaches and papilledema, she underwent a LP.    CSF pressure was elevated At 26.5  cm.. She reports that her ophthalmologist told her that the optic nerve changes were due to cataracts.  Obstructive sleep apnea: She was diagnosed with OSA around 2015 with an overnight sleep study. She was started on CPAP +6 cm in the past.    However, she had trouble tolerating the mask and stopped.   She is claustophobic so seldom made it through the night with CPAP.    REVIEW OF SYSTEMS: Constitutional: No fevers, chills, sweats, or change in appetite.   She notes some fatigue and sleepiness. Eyes: cataracts.    Improved blurriness Ear, nose and throat: No hearing loss, ear pain, nasal congestion, sore throat Cardiovascular: No chest pain, palpitations Respiratory:  No shortness of breath at rest or with exertion.   Some cough and wheezes GastrointestinaI: No nausea, vomiting,  diarrhea, abdominal pain, fecal incontinence Genitourinary:  No dysuria, urinary retention or frequency.  No nocturia. Musculoskeletal:  No neck pain, back pain Integumentary: No rash, pruritus, skin lesions Neurological: as above Psychiatric: No depression at this time.  No anxiety.  Notes increased stress at times. Endocrine: No palpitations, diaphoresis, change in appetite, change in weigh or increased thirst Hematologic/Lymphatic:  No anemia, purpura, petechiae. Allergic/Immunologic: No itchy/runny eyes, nasal congestion, recent allergic reactions, rashes  ALLERGIES: Allergies  Allergen Reactions   Aspirin Nausea And Vomiting   Codeine Rash   Latex Rash    HOME MEDICATIONS:  Current Outpatient Medications:    Ascorbic Acid (VITAMIN C GUMMIES PO), Take 1 tablet by mouth daily., Disp: , Rfl:    atorvastatin (LIPITOR) 40 MG tablet, Take 40 mg by mouth at bedtime. , Disp: , Rfl:    Calcium Carbonate (CALCIUM-CARB 600 PO), Take 1 tablet by mouth daily., Disp: , Rfl:    clobetasol (TEMOVATE) 0.05 % external solution, Apply 1 application topically 2 (two) times daily as needed (to scalp for use at 1-2 weeks at one time). , Disp: , Rfl: 2   DULoxetine (CYMBALTA) 60 MG capsule, Take 1 capsule (60 mg total) by mouth 2 (two) times daily. TAKE ONE CAPSULE BY MOUTH EVERY MORNING and TAKE ONE TABLET BY MOUTH EVERY EVENING, Disp: 60 capsule, Rfl: 11   ibuprofen (ADVIL,MOTRIN) 200 MG tablet, Take 200-400 mg by mouth daily as needed for mild pain or moderate pain. , Disp: , Rfl:    levothyroxine (SYNTHROID, LEVOTHROID) 100 MCG tablet, Take 100 mcg by mouth every morning. , Disp: , Rfl:    metFORMIN (GLUCOPHAGE-XR) 500 MG 24 hr tablet, Take 500 mg by mouth 2 (two) times daily., Disp: , Rfl:    metoprolol (LOPRESSOR) 50 MG tablet, Take 50 mg by mouth 2 (two) times daily., Disp: , Rfl:    nystatin cream (MYCOSTATIN), Apply 1 application topically 2 (two) times daily as needed for dry skin., Disp: 30 g,  Rfl: 11   oxybutynin (DITROPAN) 5 MG tablet, Take 1 tablet (5 mg total) by mouth 2 (two) times daily., Disp: 60 tablet, Rfl: 11   POTASSIUM CHLORIDE PO, Take 1 tablet by mouth in the morning and at bedtime., Disp: , Rfl:    vitamin B-12 (CYANOCOBALAMIN) 100 MCG tablet, Take 100 mcg by mouth daily., Disp: , Rfl:    VITAMIN D PO, Take 1 tablet by mouth daily., Disp: , Rfl:    EMGALITY 120 MG/ML SOAJ, INJECT THE contents of ONE pen into THE SKIN every 30 DAYS AS DIRECTED (Patient not taking: Reported on 05/11/2022), Disp: 1 mL, Rfl: 3   levETIRAcetam (KEPPRA) 500 MG tablet, TAKE ONE  TABLET BY MOUTH AT BREAKFAST AND AT BEDTIME (Patient not taking: Reported on 05/11/2022), Disp: 60 tablet, Rfl: 0   zonisamide (ZONEGRAN) 100 MG capsule, TAKE THREE CAPSULES BY MOUTH EVERYDAY AT BEDTIME (Patient not taking: Reported on 05/11/2022), Disp: 90 capsule, Rfl: 0  PAST MEDICAL HISTORY: Past Medical History:  Diagnosis Date   Asthma    Chronic back pain    Chronic leg pain    Bilateral   Chronic pelvic pain in female    Essential hypertension    Fibromyalgia    H/O cardiac catheterization    No significant CAD (only 20% LAD) 03/02/13 with normal LV function.   Headache    High cholesterol    Hypothyroidism    Mental disorder    OAB (overactive bladder) 05/21/2013   Obstructive sleep apnea    Noncompliant with CPAP due to claustophobia.   Panic attacks    Rectocele 05/21/2013   Seizures ([place])    1 severe seizure 2015; has had "small ones" since including 9/18   Type 2 diabetes mellitus ([place])     PAST SURGICAL HISTORY: Past Surgical History:  Procedure Laterality Date   ABDOMINAL HYSTERECTOMY     ANKLE SURGERY     BACK SURGERY     CARPAL TUNNEL RELEASE     knee replacements     bilateral   LEFT HEART CATHETERIZATION WITH CORONARY ANGIOGRAM N/A 03/02/2013   Procedure: LEFT HEART CATHETERIZATION WITH CORONARY ANGIOGRAM;  Surgeon: Peter M Martinique, MD;  Location: [facility] CATH LAB;  Service: Cardiovascular;   Laterality: N/A;   TONSILLECTOMY     TUBAL LIGATION      FAMILY HISTORY: Family History  Problem Relation Age of Onset   Heart attack Mother    Diabetes Mother    Hypertension Mother    Sick sinus syndrome Brother        Pacemaker   Congenital heart disease Brother    Stroke Brother    Coronary artery disease Brother    Breast cancer Maternal Aunt    Cancer Cousin        leukemia    SOCIAL HISTORY:  Social History   Socioeconomic History   Marital status: Married    Spouse name: Not on file   Number of children: Not on file   Years of education: Not on file   Highest education level: Not on file  Occupational History   Occupation: disabled  Tobacco Use   Smoking status: Former    Packs/day: 1.00    Years: 2.00    Pack years: 2.00    Types: Cigarettes    Quit date: 12/13/1974    Years since quitting: 47.4   Smokeless tobacco: Never  Vaping Use   Vaping Use: Never used  Substance and Sexual Activity   Alcohol use: No   Drug use: No   Sexual activity: Not Currently    Birth control/protection: Surgical    Comment: hyst  Other Topics Concern   Not on file  Social History Narrative   Not on file   Social Determinants of Health   Financial Resource Strain: Not on file  Food Insecurity: Not on file  Transportation Needs: Not on file  Physical Activity: Not on file  Stress: Not on file  Social Connections: Not on file  Intimate Partner Violence: Not on file     PHYSICAL EXAM  Vitals:   05/11/22 1033  BP: 138/82  Pulse: (!) 53  Weight: 222 lb (100.7 kg)  Height: 5\' 6"  (  1.676 m)    Body mass index is 35.83 kg/m.   General: The patient is an obese woman in no acute distress   Neck/Ext: The neck is supple she has tenderness over the splenius capitis muscles.   Neurologic Exam  Mental status: The patient is alert and oriented x 3 at the time of the examination. The patient has apparent normal recent and remote memory, with an apparently normal  attention span and concentration ability.   Speech is normal.  Cranial nerves: Extraocular movements are full.  Facial strength and sensation is normal. Trapezius strength is normal.    No obvious hearing deficits are noted.  Motor:  Muscle bulk is normal.   Tone is normal. Strength is  5 / 5 in all 4 extremities.   Sensory: Sensory testing is intact to touch and vibration in the hands.  She has reduced at ankles and toes  Coordination: Cerebellar testing reveals good finger-nose-finger and heel-to-shin bilaterally.  Gait and station: Station is normal.  Gait and tandem gait are mildly wide.  Romberg was negative..  Reflexes: Deep tendon reflexes are symmetric and normal bilaterally in arms and knees and absent ankle DTR.       DIAGNOSTIC DATA (LABS, IMAGING, TESTING) - I reviewed patient records, labs, notes, testing and imaging myself where available.  Lab Results  Component Value Date   WBC 7.1 11/08/2021   HGB 14.8 11/08/2021   HCT 46.9 (H) 11/08/2021   MCV 92.3 11/08/2021   PLT 219 11/08/2021      Component Value Date/Time   NA 137 11/08/2021 1649   K 3.7 11/08/2021 1649   CL 105 11/08/2021 1649   CO2 24 11/08/2021 1649   GLUCOSE 80 11/08/2021 1649   BUN 16 11/08/2021 1649   CREATININE 0.92 11/08/2021 1649   CALCIUM 9.5 11/08/2021 1649   PROT 7.6 05/19/2021 0525   ALBUMIN 4.3 05/19/2021 0525   AST 27 05/19/2021 0525   ALT 20 05/19/2021 0525   ALKPHOS 87 05/19/2021 0525   BILITOT 0.9 05/19/2021 0525   GFRNONAA >60 11/08/2021 1649   GFRAA >60 09/06/2019 0027   Lab Results  Component Value Date   CHOL 119 09/05/2017   HDL 40 (L) 09/05/2017   LDLCALC 65 09/05/2017   TRIG 69 09/05/2017   CHOLHDL 3.0 09/05/2017   Lab Results  Component Value Date   HGBA1C 5.3 09/04/2017      ASSESSMENT AND PLAN   1. Epilepsy, generalized, convulsive ([place])   2. Chronic migraine w/o aura, not intractable, w/o stat migr   3. Chronic iritis, left eye   4. Diabetic  polyneuropathy associated with type 2 diabetes mellitus ([place])   5. Polyneuropathy      1.   Continue Keppra 500 mg p.o. twice daily and zonisamide 300 mg nightly for seizures and headaches 2.   Continue Cymbalta 60 mg twice daily for mood and pain.       3    labs for iritis and polyneuropthy 4.   Emgality for chronic migraine.    5.   She will return to see me in 6 months or sooner if there are new or worsening neurologic symptoms.   Akeira Lahm A. Felecia Shelling, MD, PhD [id number], [id number] AM Certified in Neurology, Clinical Neurophysiology, Sleep Medicine, Pain Medicine and Neuroimaging  [facility] [address] [place], [place] [address] [phone]

## 2022-05-17 DIAGNOSIS — E785 Hyperlipidemia, unspecified: Secondary | ICD-10-CM | POA: Diagnosis not present

## 2022-05-17 DIAGNOSIS — I10 Essential (primary) hypertension: Secondary | ICD-10-CM | POA: Diagnosis not present

## 2022-05-17 DIAGNOSIS — E782 Mixed hyperlipidemia: Secondary | ICD-10-CM | POA: Diagnosis not present

## 2022-05-17 DIAGNOSIS — E039 Hypothyroidism, unspecified: Secondary | ICD-10-CM | POA: Diagnosis not present

## 2022-05-17 DIAGNOSIS — E119 Type 2 diabetes mellitus without complications: Secondary | ICD-10-CM | POA: Diagnosis not present

## 2022-05-17 LAB — MULTIPLE MYELOMA PANEL, SERUM
Albumin SerPl Elph-Mcnc: 3.6 g/dL (ref 2.9–4.4)
Albumin/Glob SerPl: 1.2 (ref 0.7–1.7)
Alpha 1: 0.3 g/dL (ref 0.0–0.4)
Alpha2 Glob SerPl Elph-Mcnc: 0.8 g/dL (ref 0.4–1.0)
B-Globulin SerPl Elph-Mcnc: 1 g/dL (ref 0.7–1.3)
Gamma Glob SerPl Elph-Mcnc: 1 g/dL (ref 0.4–1.8)
Globulin, Total: 3.1 g/dL (ref 2.2–3.9)
IgA/Immunoglobulin A, Serum: 201 mg/dL (ref 87–352)
IgG (Immunoglobin G), Serum: 988 mg/dL (ref 586–1602)
IgM (Immunoglobulin M), Srm: 91 mg/dL (ref 26–217)
Total Protein: 6.7 g/dL (ref 6.0–8.5)

## 2022-05-17 LAB — ANCA PROFILE
Anti-MPO Antibodies: <0.2 units (ref 0.0–0.9)
Anti-PR3 Antibodies: <0.2 units (ref 0.0–0.9)
Atypical pANCA: <1:20 {titer}
C-ANCA: <1:20 {titer}
P-ANCA: <1:20 {titer}

## 2022-05-17 LAB — ANA W/REFLEX: Anti Nuclear Antibody (ANA): NEGATIVE

## 2022-05-17 LAB — RHEUMATOID FACTOR: Rheumatoid fact SerPl-aCnc: 117.9 IU/mL — ABNORMAL HIGH (ref <14.0)

## 2022-05-17 LAB — HLA-B27 ANTIGEN: HLA B27: NEGATIVE

## 2022-05-17 LAB — VITAMIN B12: Vitamin B-12: 736 pg/mL (ref 232–1245)

## 2022-05-17 LAB — ANGIOTENSIN CONVERTING ENZYME: Angio Convert Enzyme: 55 U/L (ref 14–82)

## 2022-05-18 ENCOUNTER — Telehealth: Payer: Self-pay | Admitting: *Deleted

## 2022-05-18 DIAGNOSIS — R768 Other specified abnormal immunological findings in serum: Secondary | ICD-10-CM

## 2022-05-18 NOTE — Telephone Encounter (Signed)
-----   Message from Asa Lente, MD sent at 05/18/2022 12:52 PM EDT ----- Please let her know that the lab work showed an abnormal test consistent with rheumatoid arthritis.  This is likely causing some of her symptoms including joint pain, the problems she had with the eye (iritis) and some of the numbness.  I would like her to see a rheumatologist.

## 2022-05-18 NOTE — Telephone Encounter (Signed)
Called pt at [phone]. She picked up but then ended call. I tried calling right back but went straight to VM. I LVM for her to call office back about results.

## 2022-05-20 DIAGNOSIS — E119 Type 2 diabetes mellitus without complications: Secondary | ICD-10-CM | POA: Diagnosis not present

## 2022-05-20 DIAGNOSIS — R5383 Other fatigue: Secondary | ICD-10-CM | POA: Diagnosis not present

## 2022-05-20 DIAGNOSIS — R569 Unspecified convulsions: Secondary | ICD-10-CM | POA: Diagnosis not present

## 2022-05-20 DIAGNOSIS — E782 Mixed hyperlipidemia: Secondary | ICD-10-CM | POA: Diagnosis not present

## 2022-05-20 DIAGNOSIS — E039 Hypothyroidism, unspecified: Secondary | ICD-10-CM | POA: Diagnosis not present

## 2022-05-20 DIAGNOSIS — I10 Essential (primary) hypertension: Secondary | ICD-10-CM | POA: Diagnosis not present

## 2022-05-20 DIAGNOSIS — G4733 Obstructive sleep apnea (adult) (pediatric): Secondary | ICD-10-CM | POA: Diagnosis not present

## 2022-05-20 DIAGNOSIS — E785 Hyperlipidemia, unspecified: Secondary | ICD-10-CM | POA: Diagnosis not present

## 2022-05-20 DIAGNOSIS — G629 Polyneuropathy, unspecified: Secondary | ICD-10-CM | POA: Diagnosis not present

## 2022-05-25 ENCOUNTER — Telehealth: Payer: Self-pay | Admitting: *Deleted

## 2022-05-25 ENCOUNTER — Encounter: Payer: Self-pay | Admitting: *Deleted

## 2022-05-25 NOTE — Telephone Encounter (Signed)
Error

## 2022-05-25 NOTE — Telephone Encounter (Addendum)
LVM at [phone] for pt to call about results. Also sent mychart message/letter.

## 2022-05-25 NOTE — Addendum Note (Signed)
Addended by: Arther AbbottJONES, Aaidyn San L on: 05/25/2022 04:54 PM   Modules accepted: Orders

## 2022-06-10 ENCOUNTER — Telehealth: Payer: Self-pay | Admitting: Neurology

## 2022-06-10 NOTE — Telephone Encounter (Signed)
Referral for Rheumatology sent to [facility] [phone]. 

## 2022-06-25 DIAGNOSIS — E039 Hypothyroidism, unspecified: Secondary | ICD-10-CM | POA: Diagnosis not present

## 2022-07-07 DIAGNOSIS — H209 Unspecified iridocyclitis: Secondary | ICD-10-CM | POA: Diagnosis not present

## 2022-07-07 DIAGNOSIS — M79672 Pain in left foot: Secondary | ICD-10-CM | POA: Diagnosis not present

## 2022-07-07 DIAGNOSIS — L409 Psoriasis, unspecified: Secondary | ICD-10-CM | POA: Diagnosis not present

## 2022-07-07 DIAGNOSIS — R5382 Chronic fatigue, unspecified: Secondary | ICD-10-CM | POA: Diagnosis not present

## 2022-07-07 DIAGNOSIS — R768 Other specified abnormal immunological findings in serum: Secondary | ICD-10-CM | POA: Diagnosis not present

## 2022-07-07 DIAGNOSIS — M79671 Pain in right foot: Secondary | ICD-10-CM | POA: Diagnosis not present

## 2022-07-09 ENCOUNTER — Emergency Department (HOSPITAL_COMMUNITY)
Admission: EM | Admit: 2022-07-09 | Discharge: 2022-07-09 | Disposition: A | Discharge status: Home / Self Care | Payer: Medicare Other | Attending: Emergency Medicine | Admitting: Emergency Medicine

## 2022-07-09 ENCOUNTER — Other Ambulatory Visit: Payer: Self-pay

## 2022-07-09 ENCOUNTER — Emergency Department (HOSPITAL_COMMUNITY): Payer: Medicare Other

## 2022-07-09 ENCOUNTER — Encounter (HOSPITAL_COMMUNITY): Payer: Self-pay | Admitting: *Deleted

## 2022-07-09 DIAGNOSIS — Z471 Aftercare following joint replacement surgery: Secondary | ICD-10-CM | POA: Diagnosis not present

## 2022-07-09 DIAGNOSIS — Z79899 Other long term (current) drug therapy: Secondary | ICD-10-CM | POA: Diagnosis not present

## 2022-07-09 DIAGNOSIS — S7001XA Contusion of right hip, initial encounter: Secondary | ICD-10-CM | POA: Diagnosis not present

## 2022-07-09 DIAGNOSIS — M25561 Pain in right knee: Secondary | ICD-10-CM | POA: Diagnosis not present

## 2022-07-09 DIAGNOSIS — Z96653 Presence of artificial knee joint, bilateral: Secondary | ICD-10-CM | POA: Diagnosis not present

## 2022-07-09 DIAGNOSIS — S8001XA Contusion of right knee, initial encounter: Secondary | ICD-10-CM

## 2022-07-09 DIAGNOSIS — Z96651 Presence of right artificial knee joint: Secondary | ICD-10-CM | POA: Diagnosis not present

## 2022-07-09 DIAGNOSIS — M25551 Pain in right hip: Secondary | ICD-10-CM | POA: Diagnosis not present

## 2022-07-09 DIAGNOSIS — W1839XA Other fall on same level, initial encounter: Secondary | ICD-10-CM | POA: Diagnosis not present

## 2022-07-09 DIAGNOSIS — Z91040 Latex allergy status: Secondary | ICD-10-CM | POA: Diagnosis not present

## 2022-07-09 DIAGNOSIS — Z043 Encounter for examination and observation following other accident: Secondary | ICD-10-CM | POA: Diagnosis not present

## 2022-07-09 DIAGNOSIS — I10 Essential (primary) hypertension: Secondary | ICD-10-CM | POA: Diagnosis not present

## 2022-07-09 DIAGNOSIS — Z9889 Other specified postprocedural states: Secondary | ICD-10-CM | POA: Diagnosis not present

## 2022-07-09 DIAGNOSIS — W19XXXA Unspecified fall, initial encounter: Secondary | ICD-10-CM

## 2022-07-09 NOTE — ED Triage Notes (Signed)
Pt fell earlier today, c/o right knee pain and bruising and right hip pain.  Not sure how she fell, denies passing out. Denies hitting her head. Denies any blood thinners.

## 2022-07-09 NOTE — Discharge Instructions (Signed)
Ice for 20 minutes every 2 hours while awake for the next 2 days.  Rest.  Take ibuprofen 600 mg rotated with Tylenol 1000 mg every 4 hours as needed for pain.  Follow-up with primary doctor if not improving in the next week. 

## 2022-07-09 NOTE — ED Provider Notes (Signed)
[facility] EMERGENCY DEPARTMENT Provider Note   CSN: [id number] Arrival date & time: 07/09/22  2112     History  Chief Complaint  Patient presents with   [name] is a 66 y.o. female.  Patient is a 66 year old female with past medical history of osteoarthritis with bilateral knee replacements, hypertension, restless legs.  Patient presenting today for evaluation of a fall.  She was at a storage facility when she apparently lost her balance and fell.  She landed on her left knee and hip.  She is having pain in these areas since.  She is able to ambulate but pain has worsened since the fall.  She has taken Tylenol at home with some relief.  She denies any therapy with blood thinners.  The history is provided by the patient.       Home Medications Prior to Admission medications   Medication Sig Start Date End Date Taking? Authorizing Provider  Ascorbic Acid (VITAMIN C GUMMIES PO) Take 1 tablet by mouth daily.    [provider]  atorvastatin (LIPITOR) 40 MG tablet Take 40 mg by mouth at bedtime.  10/15/14   [provider]  Calcium Carbonate (CALCIUM-CARB 600 PO) Take 1 tablet by mouth daily.    [provider]  clobetasol (TEMOVATE) 0.05 % external solution Apply 1 application topically 2 (two) times daily as needed (to scalp for use at 1-2 weeks at one time).  04/04/18   [provider]  DULoxetine (CYMBALTA) 60 MG capsule Take 1 capsule (60 mg total) by mouth 2 (two) times daily. TAKE ONE CAPSULE BY MOUTH EVERY MORNING and TAKE ONE TABLET BY MOUTH EVERY EVENING 08/19/21   Lomax, [name], NP  Galcanezumab-gnlm (EMGALITY) 120 MG/ML SOAJ INJECT THE contents of ONE pen into THE SKIN every 30 DAYS AS DIRECTED 05/11/22   Sater, Pearletha Furl, MD  ibuprofen (ADVIL,MOTRIN) 200 MG tablet Take 200-400 mg by mouth daily as needed for mild pain or moderate pain.     [provider]  levETIRAcetam (KEPPRA) 500 MG tablet TAKE ONE TABLET BY MOUTH AT  [facility] AND AT BEDTIME 05/11/22   Sater, Pearletha Furl, MD  levothyroxine (SYNTHROID, LEVOTHROID) 100 MCG tablet Take 100 mcg by mouth every morning.  12/31/16   [provider]  metFORMIN (GLUCOPHAGE-XR) 500 MG 24 hr tablet Take 500 mg by mouth 2 (two) times daily. 04/29/21   [provider]  metoprolol (LOPRESSOR) 50 MG tablet Take 50 mg by mouth 2 (two) times daily.    [provider]  nystatin cream (MYCOSTATIN) Apply 1 application topically 2 (two) times daily as needed for dry skin. 10/07/19   Lazaro Arms, MD  oxybutynin (DITROPAN) 5 MG tablet Take 1 tablet (5 mg total) by mouth 2 (two) times daily. 09/14/21   Lazaro Arms, MD  POTASSIUM CHLORIDE PO Take 1 tablet by mouth in the morning and at bedtime.    [provider]  vitamin B-12 (CYANOCOBALAMIN) 100 MCG tablet Take 100 mcg by mouth daily.    [provider]  VITAMIN D PO Take 1 tablet by mouth daily.    [provider]  zonisamide (ZONEGRAN) 100 MG capsule TAKE THREE CAPSULES BY MOUTH EVERYDAY AT BEDTIME 05/11/22   Sater, Pearletha Furl, MD      Allergies    Aspirin, Codeine, and Latex    Review of Systems   Review of Systems  All other systems reviewed and are negative.   Physical  Exam Updated Vital Signs BP 124/79 (BP Location: Right Arm)   Pulse 63   Temp 97.8 F (36.6 C) (Oral)   Resp 18   Ht 5\' 7"  (1.702 m)   Wt 101.2 kg   SpO2 98%   BMI 34.93 kg/m  Physical Exam Vitals and nursing note reviewed.  Constitutional:      General: She is not in acute distress.    Appearance: Normal appearance. She is not ill-appearing.  HENT:     Head: Normocephalic and atraumatic.  Pulmonary:     Effort: Pulmonary effort is normal.  Musculoskeletal:     Comments: The right knee is noted to have ecchymosis superior and lateral to the patella.  There is no palpable joint effusion.  She has good range of motion with no laxity.  The right hip is grossly normal in appearance.  There  is some palpation to the lateral aspect, but no deformity.  She has good range of motion of the hip and knee joint.  DP pulses are easily palpable and motor and sensation are intact throughout the entire foot.  Skin:    General: Skin is warm and dry.  Neurological:     Mental Status: She is alert.     ED Results / Procedures / Treatments   Labs (all labs ordered are listed, but only abnormal results are displayed) Labs Reviewed - No data to display  EKG None  Radiology DG Knee Complete 4 Views Right  Result Date: 07/09/2022 CLINICAL DATA:  Fall. EXAM: RIGHT KNEE - COMPLETE 4+ VIEW COMPARISON:  None Available. FINDINGS: There is no evidence for acute fracture or dislocation. No joint effusion identified. Right knee total arthroplasty is present in anatomic alignment. There is no evidence for hardware loosening. Soft tissues are within normal limits. IMPRESSION: 1. No acute bony abnormality. 2. Right knee arthroplasty in anatomic alignment. Electronically Signed   By: 01-08-1972 M.D.   On: 07/09/2022 21:51   DG Hip Unilat W or Wo Pelvis 2-3 Views Right  Result Date: 07/09/2022 CLINICAL DATA:  Recent fall with hip pain, initial encounter EXAM: DG HIP (WITH OR WITHOUT PELVIS) 3V RIGHT COMPARISON:  None Available. FINDINGS: Pelvic ring is intact. No acute fracture or dislocation is noted. Postsurgical changes are noted in the lower lumbar spine. No soft tissue abnormality is seen. IMPRESSION: No acute abnormality noted. Electronically Signed   By: 07/11/2022 M.D.   On: 07/09/2022 21:50    Procedures Procedures    Medications Ordered in ED Medications - No data to display  ED Course/ Medical Decision Making/ A&P  X-rays are negative for fracture.  This will be treated as a contusion with rest, ice, Tylenol/Motrin, and follow-up as needed.  Final Clinical Impression(s) / ED Diagnoses Final diagnoses:  None    Rx / DC Orders ED Discharge Orders     None         07/11/2022, MD 07/09/22 2336

## 2022-07-14 ENCOUNTER — Encounter (INDEPENDENT_AMBULATORY_CARE_PROVIDER_SITE_OTHER): Payer: Self-pay | Admitting: *Deleted

## 2022-07-30 ENCOUNTER — Other Ambulatory Visit (HOSPITAL_COMMUNITY): Payer: Self-pay | Admitting: Family Medicine

## 2022-07-30 DIAGNOSIS — M79605 Pain in left leg: Secondary | ICD-10-CM | POA: Diagnosis not present

## 2022-07-30 DIAGNOSIS — I10 Essential (primary) hypertension: Secondary | ICD-10-CM | POA: Diagnosis not present

## 2022-07-30 DIAGNOSIS — E039 Hypothyroidism, unspecified: Secondary | ICD-10-CM | POA: Diagnosis not present

## 2022-07-30 DIAGNOSIS — E782 Mixed hyperlipidemia: Secondary | ICD-10-CM | POA: Diagnosis not present

## 2022-07-30 DIAGNOSIS — R296 Repeated falls: Secondary | ICD-10-CM | POA: Diagnosis not present

## 2022-07-30 DIAGNOSIS — E119 Type 2 diabetes mellitus without complications: Secondary | ICD-10-CM | POA: Diagnosis not present

## 2022-08-02 ENCOUNTER — Ambulatory Visit (HOSPITAL_COMMUNITY)
Admission: RE | Admit: 2022-08-02 | Discharge: 2022-08-02 | Disposition: A | Discharge status: Home / Self Care | Payer: Medicare Other | Source: Ambulatory Visit | Attending: Family Medicine | Admitting: Family Medicine

## 2022-08-02 DIAGNOSIS — Z96652 Presence of left artificial knee joint: Secondary | ICD-10-CM | POA: Diagnosis not present

## 2022-08-02 DIAGNOSIS — M79605 Pain in left leg: Secondary | ICD-10-CM | POA: Diagnosis not present

## 2022-08-07 DIAGNOSIS — I10 Essential (primary) hypertension: Secondary | ICD-10-CM | POA: Diagnosis not present

## 2022-08-07 DIAGNOSIS — E785 Hyperlipidemia, unspecified: Secondary | ICD-10-CM | POA: Diagnosis not present

## 2022-08-07 DIAGNOSIS — Z91040 Latex allergy status: Secondary | ICD-10-CM | POA: Diagnosis not present

## 2022-08-07 DIAGNOSIS — E119 Type 2 diabetes mellitus without complications: Secondary | ICD-10-CM | POA: Diagnosis not present

## 2022-08-07 DIAGNOSIS — R9431 Abnormal electrocardiogram [ECG] [EKG]: Secondary | ICD-10-CM | POA: Diagnosis not present

## 2022-08-07 DIAGNOSIS — R0789 Other chest pain: Secondary | ICD-10-CM | POA: Diagnosis not present

## 2022-08-07 DIAGNOSIS — T675XXA Heat exhaustion, unspecified, initial encounter: Secondary | ICD-10-CM | POA: Diagnosis not present

## 2022-08-07 DIAGNOSIS — Z885 Allergy status to narcotic agent status: Secondary | ICD-10-CM | POA: Diagnosis not present

## 2022-08-07 DIAGNOSIS — R079 Chest pain, unspecified: Secondary | ICD-10-CM | POA: Diagnosis not present

## 2022-08-07 DIAGNOSIS — R0602 Shortness of breath: Secondary | ICD-10-CM | POA: Diagnosis not present

## 2022-08-07 DIAGNOSIS — R55 Syncope and collapse: Secondary | ICD-10-CM | POA: Diagnosis not present

## 2022-08-07 DIAGNOSIS — Z886 Allergy status to analgesic agent status: Secondary | ICD-10-CM | POA: Diagnosis not present

## 2022-08-12 ENCOUNTER — Other Ambulatory Visit: Payer: Self-pay | Admitting: *Deleted

## 2022-08-12 MED ORDER — DULOXETINE HCL 60 MG PO CPEP: 60.0000 mg | ORAL_CAPSULE | Freq: Two times a day (BID) | ORAL | 3 refills | Status: DC

## 2022-08-19 ENCOUNTER — Ambulatory Visit (HOSPITAL_COMMUNITY): Payer: Medicaid Other

## 2022-09-12 ENCOUNTER — Other Ambulatory Visit: Payer: Self-pay | Admitting: Obstetrics & Gynecology

## 2022-10-19 NOTE — Progress Notes (Signed)
PATIENT: [name] DOB: [birth year]  REASON FOR VISIT: follow up HISTORY FROM: patient  No chief complaint on file.   HISTORY OF PRESENT ILLNESS:  10/19/22 ALL: [name] is a 66 y.o. female here today for follow up for dizziness/headaches/seizures.   She continues levetiracetam 500mg  BID and zonisamide 200mg  at bedtime.  She reports that "she fell out" a couple of weeks ago. Her grandson heard noises and came to help. He reports that she was laying on her stomach, "shaking all over". Patient reports that she was calling out to her momma who is deceased. No incontinence or tongue biting. EMS was called but she refused treatment. She reports that EMS told her it sounded like she had a panic attack. She had been out of "all of her medication" for at least a week.   She continues Emgality monthly and zonisamide 300mg  at bedtime. She has a headache daily. She wakes with a headache every day. She reports that headaches are in the back of her head on both side and radiates to the top of her head. Described as pressure. She feels that her head is going to explode. Occasional nausea, light or sound sensitivity. Dizziness comes and goes. She will take 400mg  ibuprofen in the mornings and again at night.   She reports using CPAP every night.   She continues duloxetine 60mg  BID. She feels that this has helped with depression and sleep. She feels that her pain is a little better as well. She continues to have fatigue. She wants to take naps during the day if she gets a chance. She takes care of two great grandchildren (2 yr old and 1 yr old) and a foster child (45yr).   HISTORY: (copied from Dr previous note)  [name] is a 66 year old woman with a h/o a generalized tonic-clonic seizure in 2016 and possible other seizures who also has neck pain,  headaches and obstructive sleep apnea.   Update 05/11/22: She has not had any definite seizure but had a spell with altered  awareness and went to the hospital. She had incontinence but no GTC. She was arguing with family t the time. .    She thinks she tolerates the medications well.      She has some headache about 28/30 days a month and many have migrainous features.  She was on Ajovy and it helped reduce the intensity, though they were still most days.    She last took 2-3 months ago.    She has headache , mostly on the top of the head and the occiput.   Holding head up and bright lights worsen the pain.   Sleep helps.   [place] rooms and staying still helps.    NSAIDs don't help much and pain returns.   She has had the headache x 1 week.  In the past, ONB/TPI only helped that day.  She takes zonisamide 200 mg nightly and Keppra 500 mg po bid for HA and seizure.      In 2018, MRI cervical showed DJD at C6-C7 without spinal stenosis but there was no nerve root compression.   Splenius capitus TPI/GON injection did not help in past.     She is falling some due to poor balance.    She feels bladder function has not changed - She notes improved depression until HA returned.  She has a lot of family stress (takes care of 2 great grand-kids and a foster child and granddaughter  lives with her).      She had iritis OD.     She is on drops.   She also has OSA and uses CPAP nightly.   She does not sleep well as her granddaughter works evenings until 1-2 am and she needs to get her 69 yo foster child to school early in the morning.     She has diabetes and notes some numbness in her toes.  This is less troublesome than her other issues.   Seizure history In August 2015, she had generalized tonic-clonic seizure activity that was witnessed by her husband. She had no prior seizures and  An EEG was performed and showed 2 runs of generalized spike wave activity, consistent with a generalized seizure disorder. She is unaware of any family history of seizures.    She has not had head trauma in the past. She had a normal childhood  development. She has not had any meningitis or encephalitis.   Pseudotumor cerebri/headache and neck pain:   Due to headaches and papilledema, she underwent a LP.    CSF pressure was elevated At 26.5 cm.. She reports that her ophthalmologist told her that the optic nerve changes were due to cataracts.   Obstructive sleep apnea: She was diagnosed with OSA around 2015 with an overnight sleep study. She was started on CPAP +6 cm in the past.    However, she had trouble tolerating the mask and stopped.   She is claustophobic so seldom made it through the night with CPAP.    REVIEW OF SYSTEMS: Out of a complete 14 system review of symptoms, the patient complains only of the following symptoms, headaches, seizure, dizziness, chronic pain, depression, all other reviewed systems are negative.  ALLERGIES: Allergies  Allergen Reactions   Aspirin Nausea And Vomiting   Codeine Rash   Latex Rash    HOME MEDICATIONS: Outpatient Medications Prior to Visit  Medication Sig Dispense Refill   Ascorbic Acid (VITAMIN C GUMMIES PO) Take 1 tablet by mouth daily.     atorvastatin (LIPITOR) 40 MG tablet Take 40 mg by mouth at bedtime.      Calcium Carbonate (CALCIUM-CARB 600 PO) Take 1 tablet by mouth daily.     clobetasol (TEMOVATE) 0.05 % external solution Apply 1 application topically 2 (two) times daily as needed (to scalp for use at 1-2 weeks at one time).   2   DULoxetine (CYMBALTA) 60 MG capsule Take 1 capsule (60 mg total) by mouth 2 (two) times daily. TAKE ONE CAPSULE BY MOUTH EVERY MORNING and TAKE ONE TABLET BY MOUTH EVERY EVENING 60 capsule 3   Galcanezumab-gnlm (EMGALITY) 120 MG/ML SOAJ INJECT THE contents of ONE pen into THE SKIN every 30 DAYS AS DIRECTED 1 mL 12   ibuprofen (ADVIL,MOTRIN) 200 MG tablet Take 200-400 mg by mouth daily as needed for mild pain or moderate pain.      levETIRAcetam (KEPPRA) 500 MG tablet TAKE ONE TABLET BY MOUTH AT BREAKFAST AND AT BEDTIME 60 tablet 11   levothyroxine  (SYNTHROID, LEVOTHROID) 100 MCG tablet Take 100 mcg by mouth every morning.      metFORMIN (GLUCOPHAGE-XR) 500 MG 24 hr tablet Take 500 mg by mouth 2 (two) times daily.     metoprolol (LOPRESSOR) 50 MG tablet Take 50 mg by mouth 2 (two) times daily.     nystatin cream (MYCOSTATIN) Apply 1 application topically 2 (two) times daily as needed for dry skin. 30 g 11   oxybutynin (DITROPAN) 5 MG  tablet TAKE ONE TABLET BY MOUTH TWICE DAILY 60 tablet 11   POTASSIUM CHLORIDE PO Take 1 tablet by mouth in the morning and at bedtime.     vitamin B-12 (CYANOCOBALAMIN) 100 MCG tablet Take 100 mcg by mouth daily.     VITAMIN D PO Take 1 tablet by mouth daily.     zonisamide (ZONEGRAN) 100 MG capsule TAKE THREE CAPSULES BY MOUTH EVERYDAY AT BEDTIME 90 capsule 12   No facility-administered medications prior to visit.    PAST MEDICAL HISTORY: Past Medical History:  Diagnosis Date   Asthma    Chronic back pain    Chronic leg pain    Bilateral   Chronic pelvic pain in female    Essential hypertension    Fibromyalgia    H/O cardiac catheterization    No significant CAD (only 20% LAD) 03/02/13 with normal LV function.   Headache    High cholesterol    Hypothyroidism    Mental disorder    OAB (overactive bladder) 05/21/2013   Obstructive sleep apnea    Noncompliant with CPAP due to claustophobia.   Panic attacks    Rectocele 05/21/2013   Seizures (HCC)    1 severe seizure 2015; has had "small ones" since including 9/18   Type 2 diabetes mellitus (HCC)     PAST SURGICAL HISTORY: Past Surgical History:  Procedure Laterality Date   ABDOMINAL HYSTERECTOMY     ANKLE SURGERY     BACK SURGERY     CARPAL TUNNEL RELEASE     knee replacements     bilateral   LEFT HEART CATHETERIZATION WITH CORONARY ANGIOGRAM N/A 03/02/2013   Procedure: LEFT HEART CATHETERIZATION WITH CORONARY ANGIOGRAM;  Surgeon: Peter M [place]az[place], MD;  Location[facility] CATH LAB;  Service: Cardiovascular;  Laterality: N/A;   TONSILLECTOMY      TUBAL LIGATION      FAMILY HISTORY: Family History  Problem Relation Age of Onset   Heart attack Mother    Diabetes Mother    Hypertension Mother    Sick sinus syndrome Brother        Pacemaker   Congenital heart disease Brother    Stroke Brother    Coronary artery disease Brother    Breast cancer Maternal Aunt    Cancer Cousin        leukemia    SOCIAL HISTORY: Social History   Socioeconomic History   Marital status: Married    Spouse name: Not on file   Number of children: Not on file   Years of education: Not on file   Highest education level: Not on file  Occupational History   Occupation: disabled  Tobacco Use   Smoking status: Former    Packs/day: 1.00    Years: 2.00    Total pack years: 2.00    Types: Cigarettes    Quit date: 12/13/1974    Years since quitting: 47.8   Smokeless tobacco: Never  Vaping Use   Vaping Use: Never used  Substance and Sexual Activity   Alcohol use: No   Drug use: No   Sexual activity: Not Currently    Birth control/protection: Surgical    Comment: hyst  Other Topics Concern   Not on file  Social History Narrative   Not on file   Social Determinants of Health   Financial Resource Strain: Not on file  Food Insecurity: Not on file  Transportation Needs: Not on file  Physical Activity: Not on file  Stress: Not on file  Social Connections: Not on file  Intimate Partner Violence: Not on file      PHYSICAL EXAM  There were no vitals filed for this visit.  There is no height or weight on file to calculate BMI.  Generalized: Well developed, in no acute distress  Cardiology: normal rate and rhythm, no murmur noted Respiratory: clear to auscultation bilaterally  Neurological examination  Mentation: Alert oriented to time, place, history taking. Follows all commands speech and language fluent Cranial nerve II-XII: Pupils were equal round reactive to light. Extraocular movements were full, visual field were full on  confrontational test. Facial sensation and strength were normal. Head turning and shoulder shrug  were normal and symmetric. Motor: The motor testing reveals 5 over 5 strength of all 4 extremities. Good symmetric motor tone is noted throughout.  Sensory: Sensory testing is intact to soft touch on all 4 extremities. No evidence of extinction is noted.  Coordination: Cerebellar testing reveals good finger-nose-finger and heel-to-shin bilaterally.  Gait and station: Gait is wide,   DIAGNOSTIC DATA (LABS, IMAGING, TESTING) - I reviewed patient records, labs, notes, testing and imaging myself where available.      No data to display           Lab Results  Component Value Date   WBC 7.1 11/08/2021   HGB 14.8 11/08/2021   HCT 46.9 (H) 11/08/2021   MCV 92.3 11/08/2021   PLT 219 11/08/2021      Component Value Date/Time   NA 137 11/08/2021 1649   K 3.7 11/08/2021 1649   CL 105 11/08/2021 1649   CO2 24 11/08/2021 1649   GLUCOSE 80 11/08/2021 1649   BUN 16 11/08/2021 1649   CREATININE 0.92 11/08/2021 1649   CALCIUM 9.5 11/08/2021 1649   PROT 6.7 05/11/2022 1126   ALBUMIN 4.3 05/19/2021 0525   AST 27 05/19/2021 0525   ALT 20 05/19/2021 0525   ALKPHOS 87 05/19/2021 0525   BILITOT 0.9 05/19/2021 0525   GFRNONAA >60 11/08/2021 1649   GFRAA >60 09/06/2019 0027   Lab Results  Component Value Date   CHOL 119 09/05/2017   HDL 40 (L) 09/05/2017   LDLCALC 65 09/05/2017   TRIG 69 09/05/2017   CHOLHDL 3.0 09/05/2017   Lab Results  Component Value Date   HGBA1C 5.3 09/04/2017   Lab Results  Component Value Date   VITAMINB12 736 05/11/2022   Lab Results  Component Value Date   TSH 0.109 (L) 09/04/2017     ASSESSMENT AND PLAN 66 y.o. year old female  has a past medical history of Asthma, Chronic back pain, Chronic leg pain, Chronic pelvic pain in female, Essential hypertension, Fibromyalgia, H/O cardiac catheterization, Headache, High cholesterol, Hypothyroidism, Mental  disorder, OAB (overactive bladder) (05/21/2013), Obstructive sleep apnea, Panic attacks, Rectocele (05/21/2013), Seizures (HCC), and Type 2 diabetes mellitus (HCC). here with   No diagnosis found.   [name] is doing fairly well today.  Depression, fibromyalgia pain and sleep have improved with addition of duloxetine 60 mg daily.  I would like to increase this dose to 60 mg twice daily.  We will continue levetiracetam 500 mg twice daily and zonisamide 200 mg at bedtime.  I am uncertain if the event that happened on Mother's Day was truly after activity.  There were concerns for event being a panic attack by EMS.  She had also been out of her medication for over a week.  I have advised no driving for 6 months out of an abundance of  precaution.  She was advised to avoid missed doses of antibiotic medications.  Seizure precautions reviewed.  We have discussed concerns of sleep apnea in detail.  She reports that sleep study was done many years ago and has not had new supplies in at least 5 years.  She is unsure who was previously managing her CPAP.  I will place orders today for a new sleep study.  We will plan to update her CPAP machine and supplies pending sleep study.  We have reviewed risk of untreated sleep apnea.  I am concerned that this may be contributing to her chronic headaches, chronic pain and intermittent dizziness.  She was encouraged to work on healthy lifestyle habits with regular exercise and well-balanced diet.  She will return to see [place] in 3-6 months, sooner if needed.  She verbalizes understanding and agreement with this plan.   No orders of the defined types were placed in this encounter.    No orders of the defined types were placed in this encounter.    Debbora Presto, FNP-C 10/19/2022, 8:16 AM [facility] [address], [place], [place] [address] [phone]

## 2022-10-21 ENCOUNTER — Encounter: Payer: Self-pay | Admitting: Family Medicine

## 2022-10-21 ENCOUNTER — Ambulatory Visit (INDEPENDENT_AMBULATORY_CARE_PROVIDER_SITE_OTHER): Payer: Medicare Other | Admitting: Family Medicine

## 2022-10-21 VITALS — BP 149/91 | HR 87 | Ht 64.0 in | Wt 229.5 lb

## 2022-10-21 DIAGNOSIS — G43709 Chronic migraine without aura, not intractable, without status migrainosus: Secondary | ICD-10-CM | POA: Diagnosis not present

## 2022-10-21 DIAGNOSIS — G40309 Generalized idiopathic epilepsy and epileptic syndromes, not intractable, without status epilepticus: Secondary | ICD-10-CM | POA: Diagnosis not present

## 2022-10-21 DIAGNOSIS — G4733 Obstructive sleep apnea (adult) (pediatric): Secondary | ICD-10-CM | POA: Diagnosis not present

## 2022-10-21 NOTE — Patient Instructions (Signed)
Below is our plan:  We will continue levetiracetam 500mg  twice daily, zonisamide 300mg  daily, duloxetine 60mg  twice daily and Emgality every month. Call Dr regarding psychiatry/psychology referral and see if there is a provider in your area. Please take CPAP to Apothecary to have them evaluate motor.   Please make sure you are staying well hydrated. I recommend 50-60 ounces daily. Well balanced diet and regular exercise encouraged. Consistent sleep schedule with 6-8 hours recommended. Please continue follow up with care team as directed.   Follow up with me in 6-8 months   You may receive a survey regarding today's visit. I encourage you to leave honest feed back as I do use this information to improve patient care. Thank you for seeing me today!

## 2022-11-19 DIAGNOSIS — E785 Hyperlipidemia, unspecified: Secondary | ICD-10-CM | POA: Diagnosis not present

## 2022-11-19 DIAGNOSIS — M810 Age-related osteoporosis without current pathological fracture: Secondary | ICD-10-CM | POA: Diagnosis not present

## 2022-11-19 DIAGNOSIS — E119 Type 2 diabetes mellitus without complications: Secondary | ICD-10-CM | POA: Diagnosis not present

## 2022-11-19 DIAGNOSIS — E039 Hypothyroidism, unspecified: Secondary | ICD-10-CM | POA: Diagnosis not present

## 2022-11-25 ENCOUNTER — Other Ambulatory Visit (HOSPITAL_COMMUNITY): Payer: Self-pay | Admitting: Family Medicine

## 2022-11-25 DIAGNOSIS — E119 Type 2 diabetes mellitus without complications: Secondary | ICD-10-CM | POA: Diagnosis not present

## 2022-11-25 DIAGNOSIS — E039 Hypothyroidism, unspecified: Secondary | ICD-10-CM | POA: Diagnosis not present

## 2022-11-25 DIAGNOSIS — E785 Hyperlipidemia, unspecified: Secondary | ICD-10-CM | POA: Diagnosis not present

## 2022-11-25 DIAGNOSIS — R296 Repeated falls: Secondary | ICD-10-CM | POA: Diagnosis not present

## 2022-11-25 DIAGNOSIS — I10 Essential (primary) hypertension: Secondary | ICD-10-CM | POA: Diagnosis not present

## 2022-11-25 DIAGNOSIS — R569 Unspecified convulsions: Secondary | ICD-10-CM | POA: Diagnosis not present

## 2022-11-25 DIAGNOSIS — Z1231 Encounter for screening mammogram for malignant neoplasm of breast: Secondary | ICD-10-CM

## 2022-11-25 DIAGNOSIS — Z23 Encounter for immunization: Secondary | ICD-10-CM | POA: Diagnosis not present

## 2022-11-25 DIAGNOSIS — G4733 Obstructive sleep apnea (adult) (pediatric): Secondary | ICD-10-CM | POA: Diagnosis not present

## 2022-11-25 DIAGNOSIS — E782 Mixed hyperlipidemia: Secondary | ICD-10-CM | POA: Diagnosis not present

## 2022-12-02 ENCOUNTER — Ambulatory Visit (HOSPITAL_COMMUNITY): Payer: Medicaid Other

## 2022-12-13 ENCOUNTER — Other Ambulatory Visit: Payer: Self-pay | Admitting: Neurology

## 2023-01-12 ENCOUNTER — Emergency Department (HOSPITAL_COMMUNITY)
Admission: EM | Admit: 2023-01-12 | Discharge: 2023-01-12 | Discharge status: Against Medical Advice | Payer: 59 | Attending: Emergency Medicine | Admitting: Emergency Medicine

## 2023-01-12 ENCOUNTER — Encounter (HOSPITAL_COMMUNITY): Payer: Self-pay

## 2023-01-12 ENCOUNTER — Other Ambulatory Visit: Payer: Self-pay

## 2023-01-12 DIAGNOSIS — M791 Myalgia, unspecified site: Secondary | ICD-10-CM | POA: Diagnosis not present

## 2023-01-12 DIAGNOSIS — Z20822 Contact with and (suspected) exposure to covid-19: Secondary | ICD-10-CM | POA: Diagnosis not present

## 2023-01-12 DIAGNOSIS — R197 Diarrhea, unspecified: Secondary | ICD-10-CM | POA: Insufficient documentation

## 2023-01-12 DIAGNOSIS — Z5321 Procedure and treatment not carried out due to patient leaving prior to being seen by health care provider: Secondary | ICD-10-CM | POA: Diagnosis not present

## 2023-01-12 DIAGNOSIS — R079 Chest pain, unspecified: Secondary | ICD-10-CM | POA: Diagnosis not present

## 2023-01-12 DIAGNOSIS — R5383 Other fatigue: Secondary | ICD-10-CM | POA: Insufficient documentation

## 2023-01-12 LAB — RESP PANEL BY RT-PCR (RSV, FLU A&B, COVID)  RVPGX2
Influenza A by PCR: NEGATIVE
Influenza B by PCR: NEGATIVE
Resp Syncytial Virus by PCR: NEGATIVE
SARS Coronavirus 2 by RT PCR: NEGATIVE

## 2023-01-12 NOTE — ED Triage Notes (Signed)
Pt reports all her foster children are sick with covid and strep.  Pt complains of generalized body aches including chest pain, fatigue and diarrhea.

## 2023-01-25 ENCOUNTER — Telehealth: Payer: Self-pay | Admitting: *Deleted

## 2023-01-25 NOTE — Telephone Encounter (Signed)
        Patient  visited [facility] 01/11/2023  for covid   Telephone encounter attempt :  1st  A HIPAA compliant voice message was left requesting a return call.  Instructed patient to call back at [phone]. [place] [phone] [address] , [place] [address] Email : [email or web address]

## 2023-02-01 ENCOUNTER — Telehealth: Payer: Self-pay | Admitting: Family Medicine

## 2023-02-01 NOTE — Telephone Encounter (Signed)
At 10:05 pt left a vm asking to be called because the Galcanezumab-gnlm [facility]) 120 MG/ML SOAJ , is not working for her. Please call pt.

## 2023-02-01 NOTE — Telephone Encounter (Addendum)
LVM returning pt call.

## 2023-02-03 NOTE — Telephone Encounter (Signed)
Left another voicemail for pt to call office.

## 2023-04-06 ENCOUNTER — Other Ambulatory Visit: Payer: Self-pay

## 2023-04-06 ENCOUNTER — Emergency Department (HOSPITAL_COMMUNITY): Payer: 59

## 2023-04-06 ENCOUNTER — Encounter (HOSPITAL_COMMUNITY): Payer: Self-pay | Admitting: *Deleted

## 2023-04-06 ENCOUNTER — Emergency Department (HOSPITAL_COMMUNITY)
Admission: EM | Admit: 2023-04-06 | Discharge: 2023-04-06 | Disposition: A | Discharge status: Home / Self Care | Payer: 59 | Attending: Emergency Medicine | Admitting: Emergency Medicine

## 2023-04-06 DIAGNOSIS — E119 Type 2 diabetes mellitus without complications: Secondary | ICD-10-CM | POA: Insufficient documentation

## 2023-04-06 DIAGNOSIS — Z91040 Latex allergy status: Secondary | ICD-10-CM | POA: Insufficient documentation

## 2023-04-06 DIAGNOSIS — I10 Essential (primary) hypertension: Secondary | ICD-10-CM | POA: Diagnosis not present

## 2023-04-06 DIAGNOSIS — R079 Chest pain, unspecified: Secondary | ICD-10-CM | POA: Diagnosis not present

## 2023-04-06 DIAGNOSIS — J45909 Unspecified asthma, uncomplicated: Secondary | ICD-10-CM | POA: Insufficient documentation

## 2023-04-06 DIAGNOSIS — Z7984 Long term (current) use of oral hypoglycemic drugs: Secondary | ICD-10-CM | POA: Insufficient documentation

## 2023-04-06 DIAGNOSIS — Z79899 Other long term (current) drug therapy: Secondary | ICD-10-CM | POA: Diagnosis not present

## 2023-04-06 DIAGNOSIS — R0789 Other chest pain: Secondary | ICD-10-CM | POA: Diagnosis not present

## 2023-04-06 DIAGNOSIS — R0602 Shortness of breath: Secondary | ICD-10-CM | POA: Diagnosis not present

## 2023-04-06 LAB — BASIC METABOLIC PANEL
Anion gap: 6 (ref 5–15)
BUN: 14 mg/dL (ref 8–23)
CO2: 24 mmol/L (ref 22–32)
Calcium: 8.9 mg/dL (ref 8.9–10.3)
Chloride: 108 mmol/L (ref 98–111)
Creatinine, Ser: 0.92 mg/dL (ref 0.44–1.00)
GFR, Estimated: >60 mL/min (ref >60)
Glucose, Bld: 93 mg/dL (ref 70–99)
Potassium: 4.5 mmol/L (ref 3.5–5.1)
Sodium: 138 mmol/L (ref 135–145)

## 2023-04-06 LAB — CBC
HCT: 44 % (ref 36.0–46.0)
Hemoglobin: 14.2 g/dL (ref 12.0–15.0)
MCH: 29.5 pg (ref 26.0–34.0)
MCHC: 32.3 g/dL (ref 30.0–36.0)
MCV: 91.3 fL (ref 80.0–100.0)
Platelets: 285 10*3/uL (ref 150–400)
RBC: 4.82 MIL/uL (ref 3.87–5.11)
RDW: 12.6 % (ref 11.5–15.5)
WBC: 6.6 10*3/uL (ref 4.0–10.5)
nRBC: 0 % (ref 0.0–0.2)

## 2023-04-06 LAB — TROPONIN I (HIGH SENSITIVITY)
Troponin I (High Sensitivity): 2 ng/L (ref <18)
Troponin I (High Sensitivity): <2 ng/L (ref <18)

## 2023-04-06 LAB — D-DIMER, QUANTITATIVE: D-Dimer, Quant: 0.43 ug/mL-FEU (ref 0.00–0.50)

## 2023-04-06 MED ORDER — ONDANSETRON HCL 4 MG/2ML IJ SOLN
4.0000 mg | Freq: Once | INTRAMUSCULAR | Status: AC
  Administered 2023-04-06: 4 mg via INTRAVENOUS
  Filled 2023-04-06: qty 2

## 2023-04-06 MED ORDER — FENTANYL CITRATE PF 50 MCG/ML IJ SOSY
25.0000 ug | PREFILLED_SYRINGE | Freq: Once | INTRAMUSCULAR | Status: AC
  Administered 2023-04-06: 25 ug via INTRAVENOUS
  Filled 2023-04-06: qty 1

## 2023-04-06 MED ORDER — FENTANYL CITRATE PF 50 MCG/ML IJ SOSY
50.0000 ug | PREFILLED_SYRINGE | Freq: Once | INTRAMUSCULAR | Status: AC
  Administered 2023-04-06: 50 ug via INTRAVENOUS
  Filled 2023-04-06: qty 1

## 2023-04-06 MED ORDER — SODIUM CHLORIDE 0.9 % IV BOLUS
500.0000 mL | Freq: Once | INTRAVENOUS | Status: AC
  Administered 2023-04-06: 500 mL via INTRAVENOUS

## 2023-04-06 NOTE — ED Notes (Signed)
Pt eating graham crackers, peanut butter and cherry coke

## 2023-04-06 NOTE — ED Notes (Signed)
[facility] Provider at bedside. 

## 2023-04-06 NOTE — ED Triage Notes (Signed)
Pt with mid chest pressure that woke pt up from sleep this morning.  + SOB, denies any N/V

## 2023-04-06 NOTE — ED Provider Notes (Signed)
[facility] AT [facility] Provider Note   CSN: [id number] Arrival date & time: 04/06/23  1400     History  Chief Complaint  Patient presents with   Chest Pain    [name] is a 67 y.o. female.   Chest Pain Associated symptoms: shortness of breath   Associated symptoms: no abdominal pain, no back pain, no dizziness, no fever, no headache, no nausea, no numbness, no palpitations, no vomiting and no weakness        [name] is a 67 y.o. female past medical history of asthma, panic attacks, seizures, type 2 diabetes, hyperlipidemia, fibromyalgia,, hypertension who presents to the Emergency Department complaining of mid chest pain that she describes as sharp to dull.  Pain radiates toward her right shoulder.  Pain is worse with deep breathing and with certain movements and improves with lying on her left side in the slightly prone position.  Describes having shortness of breath with exertion.  No longer uses albuterol for her asthma.  States that deep breathing makes her feel like she needs to cough.  She denies any nasal congestion, fever, chills, productive cough, and swelling of her lower extremities.  Patient had cardiac cardiogram 08/2017 that showed EF of 60 to 65%.  Seen by Dr. Wyline Mood  Home Medications Prior to Admission medications   Medication Sig Start Date End Date Taking? Authorizing Provider  Ascorbic Acid (VITAMIN C GUMMIES PO) Take 1 tablet by mouth daily.    [provider]  atorvastatin (LIPITOR) 40 MG tablet Take 40 mg by mouth at bedtime.  10/15/14   [provider]  Calcium Carbonate (CALCIUM-CARB 600 PO) Take 1 tablet by mouth daily.    [provider]  clobetasol (TEMOVATE) 0.05 % external solution Apply 1 application topically 2 (two) times daily as needed (to scalp for use at 1-2 weeks at one time).  04/04/18   [provider]  DULoxetine (CYMBALTA) 60 MG capsule TAKE ONE CAPSULE BY  MOUTH TWICE DAILY 12/14/22   Sater, Pearletha Furl, MD  Galcanezumab-gnlm [facility]) 120 MG/ML SOAJ INJECT THE contents of ONE pen into THE SKIN every 30 DAYS AS DIRECTED 05/11/22   Sater, Pearletha Furl, MD  ibuprofen (ADVIL,MOTRIN) 200 MG tablet Take 200-400 mg by mouth daily as needed for mild pain or moderate pain.     [provider]  levETIRAcetam (KEPPRA) 500 MG tablet TAKE ONE TABLET BY MOUTH AT [facility] AND AT BEDTIME 05/11/22   Sater, Pearletha Furl, MD  levothyroxine (SYNTHROID, LEVOTHROID) 100 MCG tablet Take 100 mcg by mouth every morning.  12/31/16   [provider]  metFORMIN (GLUCOPHAGE-XR) 500 MG 24 hr tablet Take 500 mg by mouth 2 (two) times daily. 04/29/21   [provider]  metoprolol (LOPRESSOR) 50 MG tablet Take 50 mg by mouth 2 (two) times daily.    [provider]  nystatin cream (MYCOSTATIN) Apply 1 application topically 2 (two) times daily as needed for dry skin. 10/07/19   Lazaro Arms, MD  oxybutynin (DITROPAN) 5 MG tablet TAKE ONE TABLET BY MOUTH TWICE DAILY 09/12/22   Lazaro Arms, MD  POTASSIUM CHLORIDE PO Take 1 tablet by mouth in the morning and at bedtime.    [provider]  vitamin B-12 (CYANOCOBALAMIN) 100 MCG tablet Take 100 mcg by mouth daily.    [provider]  VITAMIN D PO Take 1 tablet by mouth daily.    [provider]  zonisamide [facility])  100 MG capsule TAKE THREE CAPSULES BY MOUTH EVERYDAY AT BEDTIME 05/11/22   Sater, Pearletha Furl, MD      Allergies    Aspirin, Codeine, and Latex    Review of Systems   Review of Systems  Constitutional:  Negative for appetite change, chills and fever.  HENT:  Negative for congestion.   Respiratory:  Positive for chest tightness and shortness of breath. Negative for wheezing.   Cardiovascular:  Positive for chest pain. Negative for palpitations and leg swelling.  Gastrointestinal:  Negative for abdominal pain, nausea and vomiting.  Genitourinary:  Negative for  difficulty urinating and flank pain.  Musculoskeletal:  Negative for back pain.  Neurological:  Negative for dizziness, speech difficulty, weakness, numbness and headaches.  Psychiatric/Behavioral:  Negative for confusion.     Physical Exam Updated Vital Signs BP (!) 110/57   Pulse 68   Resp 15   Ht 5\' 6"  (1.676 m)   Wt 103.9 kg   SpO2 95%   BMI 36.96 kg/m  Physical Exam Vitals and nursing note reviewed.  Constitutional:      General: She is not in acute distress.    Appearance: She is well-developed. She is not ill-appearing or toxic-appearing.  Cardiovascular:     Rate and Rhythm: Normal rate and regular rhythm.     Pulses: Normal pulses.  Pulmonary:     Effort: Pulmonary effort is normal. No respiratory distress.     Breath sounds: No wheezing or rhonchi.     Comments: Patient speaking in complete sentences without respiratory difficulty.  No increased work of breathing Abdominal:     Palpations: Abdomen is soft.     Tenderness: There is no abdominal tenderness.  Musculoskeletal:     Cervical back: Normal range of motion.     Right lower leg: No edema.     Left lower leg: No edema.  Skin:    General: Skin is warm.     Capillary Refill: Capillary refill takes less than 2 seconds.  Neurological:     General: No focal deficit present.     Mental Status: She is alert.     Sensory: No sensory deficit.     Motor: No weakness.     ED Results / Procedures / Treatments   Labs (all labs ordered are listed, but only abnormal results are displayed) Labs Reviewed  BASIC METABOLIC PANEL  CBC  D-DIMER, QUANTITATIVE  TROPONIN I (HIGH SENSITIVITY)  TROPONIN I (HIGH SENSITIVITY)    EKG EKG Interpretation  Date/Time:  Wednesday April 06 2023 14:21:39 EDT Ventricular Rate:  78 PR Interval:  166 QRS Duration: 78 QT Interval:  366 QTC Calculation: 417 R Axis:   29 Text Interpretation: Normal sinus rhythm Cannot rule out Anterior infarct Abnormal ECG Confirmed by  Alvino Blood ([id number]) on 04/06/2023 5:41:54 PM  Radiology DG Chest 2 View  Result Date: 04/06/2023 CLINICAL DATA:  Chest pain and shortness of breath EXAM: CHEST - 2 VIEW COMPARISON:  X-ray 12/15/2021 and older FINDINGS: No consolidation, pneumothorax or effusion. No edema. Normal cardiopericardial silhouette. Degenerative changes are seen of the spine. IMPRESSION: No acute cardiopulmonary disease. Electronically Signed   By: Karen Kays M.D.   On: 04/06/2023 14:47    Procedures Procedures    Medications Ordered in ED Medications  fentaNYL (SUBLIMAZE) injection 25 mcg (has no administration in time range)  ondansetron (ZOFRAN) injection 4 mg (has no administration in time range)    ED Course/ Medical Decision Making/ A&P  Medical Decision Making Patient here with complaint of chest pain that is positional.  Also has some dyspnea on exertion as well.  Last echocardiogram in 2018 showed EF of 60 to 65%.  Not currently followed by cardiology.  Differential would include but not limited to ACS, musculoskeletal pain, pericarditis, PE, pneumonia, normal process, dissection  Pain is somewhat reproducible on exam but she does have some significant risk factors.  Will check labs, troponin, EKG and chest x-ray  Amount and/or Complexity of Data Reviewed Labs: ordered.    Details: Labs interpreted by me, no evidence of leukocytosis, D-dimer unremarkable.  Chemistries without significant derangement, delta troponin is flat Radiology: ordered.    Details: Chest x-ray without acute cardiopulmonary process, no evidence of effusion. ECG/medicine tests: ordered.    Details: EKG shows normal sinus rhythm Discussion of management or test interpretation with external provider(s): Workup here essentially reassuring.  Mostly reproducible chest pain.  Her D-dimer was reassuring.  No tachycardia tachypnea or hypoxia.  She was given pain medication and blood pressure dropped  to 96/63.  Patient remained alert.  Was given snacks, oral fluids and IV fluid bolus.   On recheck, blood pressure now 105/70, patient resting comfortably.  Chest pain has resolved.  She has tolerated oral fluids and ate a snack while here.  Appears appropriate for discharge home, will follow-up closely outpatient with PCP and I have also recommended that she contact her cardiologist to arrange follow-up.  Risk Prescription drug management.   HEART score moderate        Final Clinical Impression(s) / ED Diagnoses Final diagnoses:  Atypical chest pain    Rx / DC Orders ED Discharge Orders     None         Rosey Bath 04/06/23 2047    Lonell Grandchild, MD 04/07/23 1308

## 2023-04-06 NOTE — ED Provider Triage Note (Signed)
Emergency Medicine Provider Triage Evaluation Note  [name] , a 67 y.o. female with past medical history of asthma, anxiety, type 2 diabetes, seizures, fibromyalgia was evaluated in triage.  Pt complains of central chest pain upon waking at 6 AM this morning describes a sharp to dull pain to the center of her chest that radiates toward her right shoulder.  Pain worse with movement and deep breathing, somewhat positional stating that it improves if she is lying on her left side slightly prone.  Some shortness of breath that worsens with exertion.  Deep breathing makes her feel like she has to cough.  Denies fever, chills, productive cough swelling to her lower extremities  Review of Systems  Positive: Chest pain, exertional shortness of breath Negative: Cough, fever, back pain, neck pain peripheral edema  Physical Exam  BP 106/67 (BP Location: Left Arm)   Pulse 79   Resp 18   Ht  (1.676 m)   Wt 103.9 kg   SpO2 98%   BMI 36.96 kg/m  Gen:   Awake, no distress   Resp:  Normal effort  MSK:   Moves extremities without difficulty  Other:    Medical Decision Making  Medically screening exam initiated at 2:46 PM.  Appropriate orders placed.  [name] was informed that the remainder of the evaluation will be completed by another provider, this initial triage assessment does not replace that evaluation, and the importance of remaining in the ED until their evaluation is complete.     Pauline Aus, PA-C 04/06/23 1451

## 2023-04-06 NOTE — Discharge Instructions (Signed)
Please call your primary care provider tomorrow to arrange follow-up appointment.  I also recommend that you contact the local cardiology group to arrange follow-up appointment as well.  Return to the emergency department for any new or worsening symptoms.

## 2023-04-08 ENCOUNTER — Other Ambulatory Visit: Payer: Self-pay | Admitting: Neurology

## 2023-04-08 ENCOUNTER — Telehealth: Payer: Self-pay | Admitting: Physician Assistant

## 2023-04-08 NOTE — Telephone Encounter (Signed)
Got disconnected while trying to get patient schedule.

## 2023-04-11 ENCOUNTER — Encounter: Payer: Self-pay | Admitting: *Deleted

## 2023-04-11 ENCOUNTER — Ambulatory Visit: Payer: 59 | Attending: Cardiology | Admitting: Cardiology

## 2023-04-11 ENCOUNTER — Encounter: Payer: Self-pay | Admitting: Cardiology

## 2023-04-11 VITALS — BP 118/80 | HR 81 | Ht 65.0 in | Wt 230.4 lb

## 2023-04-11 DIAGNOSIS — R079 Chest pain, unspecified: Secondary | ICD-10-CM

## 2023-04-11 DIAGNOSIS — I10 Essential (primary) hypertension: Secondary | ICD-10-CM

## 2023-04-11 DIAGNOSIS — R0789 Other chest pain: Secondary | ICD-10-CM

## 2023-04-11 DIAGNOSIS — R011 Cardiac murmur, unspecified: Secondary | ICD-10-CM | POA: Diagnosis not present

## 2023-04-11 DIAGNOSIS — E782 Mixed hyperlipidemia: Secondary | ICD-10-CM

## 2023-04-11 NOTE — Patient Instructions (Addendum)
Medication Instructions:  Your physician recommends that you continue on your current medications as directed. Please refer to the Current Medication list given to you today.  Labwork: none  Testing/Procedures: Your physician has requested that you have an echocardiogram. Echocardiography is a painless test that uses sound waves to create images of your heart. It provides your doctor with information about the size and shape of your heart and how well your heart's chambers and valves are working. This procedure takes approximately one hour. There are no restrictions for this procedure. Please do NOT wear cologne, perfume, aftershave, or lotions (deodorant is allowed). Please arrive 15 minutes prior to your appointment time. Your physician has requested that you have a lexiscan myoview. For further information please visit [email or web address] Please follow instruction sheet, as given.  Follow-Up: Your physician recommends that you schedule a follow-up appointment in: pending  Any Other Special Instructions Will Be Listed Below (If Applicable).  If you need a refill on your cardiac medications before your next appointment, please call your pharmacy.

## 2023-04-11 NOTE — Telephone Encounter (Signed)
Last seen on 10/22/23 per note "We will continue levetiracetam 500mg  twice daily, zonisamide 300mg  daily, duloxetine 60mg  twice daily  "  Follow up scheduled on 05/25/22  Cymbalta 60 mg tablet last filled on 03/17/23 #60 tablets (30 day supply)  Keppra 500 mg tablet last filled on 03/17/23 #60 tablets (30 day supply)

## 2023-04-11 NOTE — Progress Notes (Signed)
Cardiology Office Note  Date: 04/11/2023   ID: Chloe, Greene 13-Dec-1956, MRN [id number]  PCP: Benita Stabile, MD  Chief Complaint:  Chief Complaint  Patient presents with   Chest pain   History of Present Illness: [name] is a 67 y.o. female self-referred for cardiology consultation following recent ER encounter for chest discomfort.  She was evaluated by Ms. Triplett PA-C and told to contact our office.  She was seen by our practice several years ago with a history of recurring chest pain despite reassuring ischemic workup including cardiac catheterization in 2014.  Workup in the ER recently was reassuring from a cardiac perspective, normal high-sensitivity troponin I levels, normal D-dimer, ECG without acute ST segment changes, and chest x-ray without acute findings.  She is here today with her cousin.  She states that she has been on a lot of stress.  This past Friday she woke up experiencing a "sharp pressure" in her chest radiating into the right shoulder.  Describes this as being very intense initially, did not resolve although ultimately went away after treatment for pain in the ER and subsequently using NSAIDs as an outpatient.  More recently she has been having a pain in her neck, worse with movement.  No headaches, no fevers or chills, no breathlessness, no palpitations.  Cardiac catheterization in 2014 demonstrated no significant obstructive CAD with normal LVEF.  She had mild plaque within the LAD at that time.  She has not had any recent ischemic testing.  She does have a family history of CAD and also personal history of hypertension, type 2 diabetes mellitus, and hyperlipidemia.  She continues on medical therapy with followed by Dr. [name].  Review of Systems: As outlined in the history of present illness.  Past Medical History: Past Medical History:  Diagnosis Date   Asthma    Chronic back pain    Chronic leg pain    Bilateral   Chronic pelvic pain in female     Essential hypertension    Fibromyalgia    H/O cardiac catheterization    No significant CAD (only 20% LAD) 03/02/13 with normal LV function.   Headache    High cholesterol    Hypothyroidism    OAB (overactive bladder)    Obstructive sleep apnea    Noncompliant with CPAP due to claustophobia.   Panic attacks    Rectocele    Seizures (HCC)    1 severe seizure 2015; has had "small ones" since including 9/18   Type 2 diabetes mellitus (HCC)    Past Surgical History: Past Surgical History:  Procedure Laterality Date   ABDOMINAL HYSTERECTOMY     ANKLE SURGERY     BACK SURGERY     CARPAL TUNNEL RELEASE     knee replacements     bilateral   LEFT HEART CATHETERIZATION WITH CORONARY ANGIOGRAM N/A 03/02/2013   Procedure: LEFT HEART CATHETERIZATION WITH CORONARY ANGIOGRAM;  Surgeon: Peter M Swaziland, MD;  Location: [facility] CATH LAB;  Service: Cardiovascular;  Laterality: N/A;   TONSILLECTOMY     TUBAL LIGATION     Family History: Family History  Problem Relation Age of Onset   Heart attack Mother    Diabetes Mother    Hypertension Mother    Sick sinus syndrome Brother        Pacemaker   Congenital heart disease Brother    Stroke Brother    Coronary artery disease Brother    Breast cancer Maternal Aunt  Cancer Cousin        leukemia   Social History:  Social History   Tobacco Use   Smoking status: Former    Packs/day: 1.00    Years: 2.00    Additional pack years: 0.00    Total pack years: 2.00    Types: Cigarettes    Quit date: 12/13/1974    Years since quitting: 48.3   Smokeless tobacco: Never  Substance Use Topics   Alcohol use: No   Medications: Current Outpatient Medications on File Prior to Visit  Medication Sig Dispense Refill   Ascorbic Acid (VITAMIN C GUMMIES PO) Take 1 tablet by mouth daily.     atorvastatin (LIPITOR) 40 MG tablet Take 40 mg by mouth at bedtime.      Calcium Carbonate (CALCIUM-CARB 600 PO) Take 1 tablet by mouth daily.     clobetasol  (TEMOVATE) 0.05 % external solution Apply 1 application topically 2 (two) times daily as needed (to scalp for use at 1-2 weeks at one time).   2   DULoxetine (CYMBALTA) 60 MG capsule TAKE ONE CAPSULE BY MOUTH TWICE DAILY 60 capsule 1   ferrous sulfate 325 (65 FE) MG EC tablet Take 325 mg by mouth daily.     Galcanezumab-gnlm (EMGALITY) 120 MG/ML SOAJ INJECT THE contents of ONE pen into THE SKIN every 30 DAYS AS DIRECTED 1 mL 12   ibuprofen (ADVIL,MOTRIN) 200 MG tablet Take 200-400 mg by mouth daily as needed for mild pain or moderate pain.      levETIRAcetam (KEPPRA) 500 MG tablet TAKE ONE TABLET BY MOUTH AT BREAKFAST AND AT BEDTIME 60 tablet 1   levothyroxine (SYNTHROID, LEVOTHROID) 100 MCG tablet Take 100 mcg by mouth every morning.      metFORMIN (GLUCOPHAGE-XR) 500 MG 24 hr tablet Take 500 mg by mouth 2 (two) times daily.     metoprolol (LOPRESSOR) 50 MG tablet Take 50 mg by mouth 2 (two) times daily.     nystatin cream (MYCOSTATIN) Apply 1 application topically 2 (two) times daily as needed for dry skin. 30 g 11   oxybutynin (DITROPAN) 5 MG tablet TAKE ONE TABLET BY MOUTH TWICE DAILY 60 tablet 11   POTASSIUM CHLORIDE PO Take 1 tablet by mouth in the morning and at bedtime.     vitamin B-12 (CYANOCOBALAMIN) 100 MCG tablet Take 100 mcg by mouth daily.     VITAMIN D PO Take 1 tablet by mouth daily.     zonisamide (ZONEGRAN) 100 MG capsule TAKE THREE CAPSULES BY MOUTH EVERYDAY AT BEDTIME 90 capsule 12   No current facility-administered medications on file prior to visit.   Allergies: Allergies  Allergen Reactions   Aspirin Nausea And Vomiting   Codeine Rash   Latex Rash   Physical Exam: VS:  BP 118/80   Pulse 81   Ht 5\' 5"  (1.651 m)   Wt 230 lb 6.4 oz (104.5 kg)   SpO2 96%   BMI 38.34 kg/m , BMI Body mass index is 38.34 kg/m.  Wt Readings from Last 3 Encounters:  04/11/23 230 lb 6.4 oz (104.5 kg)  04/06/23 229 lb (103.9 kg)  01/12/23 220 lb (99.8 kg)    General: Patient  appears comfortable at rest. HEENT: Conjunctiva and lids normal. Neck: Supple, no elevated JVP or carotid bruits. Lungs: Clear to auscultation, nonlabored breathing at rest. Cardiac: Regular rate and rhythm, no S3, 2/6 systolic murmur, no pericardial rub. Abdomen: Soft, nontender, bowel sounds present. Extremities: No pitting edema, distal pulses  2+. Skin: Warm and dry. Musculoskeletal: No kyphosis. Neuropsychiatric: Alert and oriented x3, affect grossly appropriate.  ECG:  An ECG dated 04/06/2023 was personally reviewed today and demonstrated:  Sinus rhythm, low voltage in the limb leads.  Labwork: 04/06/2023: BUN 14; Creatinine, Ser 0.92; Hemoglobin 14.2; Platelets 285; Potassium 4.5; Sodium 138     Component Value Date/Time   CHOL 119 09/05/2017 0257   TRIG 69 09/05/2017 0257   HDL 40 (L) 09/05/2017 0257   CHOLHDL 3.0 09/05/2017 0257   VLDL 14 09/05/2017 0257   LDLCALC 65 09/05/2017 0257   Other Studies Reviewed Today:  Chest x-ray 04/06/2023: FINDINGS: No consolidation, pneumothorax or effusion. No edema. Normal cardiopericardial silhouette. Degenerative changes are seen of the spine.   IMPRESSION: No acute cardiopulmonary disease.  Assessment and Plan:  1.  Recurrent chest pain, mixed features although largely atypical.  Recent ER evaluation showed no evidence of ACS, chest x-ray without acute abnormalities, ECG with low voltage but no acute changes.  She has a family history of CAD, also personal history of hypertension, type 2 diabetes mellitus, and hyperlipidemia.  She has not undergone any recent ischemic testing.  Plan to proceed with a Lexiscan Myoview, also echocardiogram given heart murmur.  2.  Mixed hyperlipidemia, she reports compliance with Lipitor.  LDL was in the 60s several years ago.  She is following now with Dr. [name].  Due for follow-up visit soon.  3.  Essential hypertension, she is on Lopressor.  Normal today.  4.  Seizure disorder, followed by  neurology and reportedly stable on current regimen.  5.  Type 2 diabetes mellitus, currently on Glucophage XR.  Disposition:  Follow up  test results.  Signed, Jonelle Sidle, M.D., F.A.C.C. [facility]

## 2023-04-12 ENCOUNTER — Telehealth: Payer: Self-pay

## 2023-04-12 NOTE — Telephone Encounter (Signed)
Transition Care Management Follow-up Telephone Call Date of discharge and from where: 424/2024 How have you been since you were released from the hospital? Patient stated she is feeling better but still has some chest pain. Any questions or concerns? Yes  Items Reviewed: Did the pt receive and understand the discharge instructions provided? Yes  Medications obtained and verified?  No medication prescribed. Other? No  Any new allergies since your discharge? Yes  Dietary orders reviewed? Yes Do you have support at home?  Patient lives independently.  Follow up appointments reviewed:  PCP Hospital f/u appt confirmed? Yes  Scheduled to see Darrold Junker on 04/11/2023 @ [facility]. Specialist Hospital f/u appt confirmed?  Scheduled to see  on  @ . Are transportation arrangements needed? No  If their condition worsens, is the pt aware to call PCP or go to the Emergency Dept.? Yes Was the patient provided with contact information for the PCP's office or ED? Yes Was to pt encouraged to call back with questions or concerns? Yes  Tyrik Stetzer [facility]  [facility] Community Resource Care Guide   [email or web address]  ?? [phone]   Website: [email or web address]

## 2023-04-13 ENCOUNTER — Encounter (HOSPITAL_COMMUNITY): Payer: Self-pay

## 2023-04-13 ENCOUNTER — Ambulatory Visit (HOSPITAL_COMMUNITY)
Admission: RE | Admit: 2023-04-13 | Discharge: 2023-04-13 | Disposition: A | Discharge status: Home / Self Care | Payer: 59 | Source: Ambulatory Visit | Attending: Cardiology | Admitting: Cardiology

## 2023-04-13 DIAGNOSIS — R079 Chest pain, unspecified: Secondary | ICD-10-CM | POA: Diagnosis not present

## 2023-04-13 LAB — NM MYOCAR MULTI W/SPECT W/WALL MOTION / EF
LV dias vol: 64 mL (ref 46–106)
LV sys vol: 19 mL
Nuc Stress EF: 70 %
Peak HR: 86 {beats}/min
RATE: 0.5
Rest HR: 69 {beats}/min
Rest Nuclear Isotope Dose: 10.5 mCi
SDS: 3
SRS: 3
SSS: 6
ST Depression (mm): 0 mm
Stress Nuclear Isotope Dose: 33 mCi
TID: 1.33

## 2023-04-13 MED ORDER — SODIUM CHLORIDE FLUSH 0.9 % IV SOLN
INTRAVENOUS | Status: AC
  Filled 2023-04-13: qty 10

## 2023-04-13 MED ORDER — REGADENOSON 0.4 MG/5ML IV SOLN
INTRAVENOUS | Status: AC
  Administered 2023-04-13: 0.4 mg via INTRAVENOUS
  Filled 2023-04-13: qty 5

## 2023-04-13 MED ORDER — TECHNETIUM TC 99M TETROFOSMIN IV KIT
30.0000 | PACK | Freq: Once | INTRAVENOUS | Status: AC | PRN
  Administered 2023-04-13: 33 via INTRAVENOUS

## 2023-04-13 MED ORDER — TECHNETIUM TC 99M TETROFOSMIN IV KIT
10.0000 | PACK | Freq: Once | INTRAVENOUS | Status: AC | PRN
  Administered 2023-04-13: 10.5 via INTRAVENOUS

## 2023-04-14 DIAGNOSIS — G43009 Migraine without aura, not intractable, without status migrainosus: Secondary | ICD-10-CM | POA: Diagnosis not present

## 2023-04-14 DIAGNOSIS — E119 Type 2 diabetes mellitus without complications: Secondary | ICD-10-CM | POA: Diagnosis not present

## 2023-04-14 DIAGNOSIS — R0789 Other chest pain: Secondary | ICD-10-CM | POA: Diagnosis not present

## 2023-04-14 DIAGNOSIS — R569 Unspecified convulsions: Secondary | ICD-10-CM | POA: Diagnosis not present

## 2023-04-14 DIAGNOSIS — E782 Mixed hyperlipidemia: Secondary | ICD-10-CM | POA: Diagnosis not present

## 2023-04-14 DIAGNOSIS — G4733 Obstructive sleep apnea (adult) (pediatric): Secondary | ICD-10-CM | POA: Diagnosis not present

## 2023-04-14 DIAGNOSIS — I10 Essential (primary) hypertension: Secondary | ICD-10-CM | POA: Diagnosis not present

## 2023-05-06 ENCOUNTER — Ambulatory Visit (HOSPITAL_COMMUNITY)
Admission: RE | Admit: 2023-05-06 | Discharge: 2023-05-06 | Disposition: A | Discharge status: Home / Self Care | Payer: 59 | Source: Ambulatory Visit | Attending: Cardiology | Admitting: Cardiology

## 2023-05-06 DIAGNOSIS — R079 Chest pain, unspecified: Secondary | ICD-10-CM | POA: Diagnosis not present

## 2023-05-06 DIAGNOSIS — R011 Cardiac murmur, unspecified: Secondary | ICD-10-CM | POA: Diagnosis not present

## 2023-05-06 LAB — ECHOCARDIOGRAM COMPLETE
Area-P 1/2: 2.99 cm2
MV M vel: 0.99 m/s
MV Peak grad: 3.9 mmHg
S' Lateral: 3 cm

## 2023-05-06 NOTE — Progress Notes (Signed)
  Echocardiogram 2D Echocardiogram has been performed.  [name] 05/06/2023, 9:21 AM

## 2023-05-10 ENCOUNTER — Other Ambulatory Visit: Payer: Self-pay | Admitting: Neurology

## 2023-05-10 NOTE — Telephone Encounter (Signed)
Last seen on 10/21/22 per note " We will continue levetiracetam 500 mg twice daily and zonisamide 300 mg at bedtime " Follow up scheduled on 05/26/23 Last filled on 04/20/23 #90 tablets (30 day supply)

## 2023-05-16 ENCOUNTER — Other Ambulatory Visit: Payer: Self-pay | Admitting: Neurology

## 2023-05-16 DIAGNOSIS — G43709 Chronic migraine without aura, not intractable, without status migrainosus: Secondary | ICD-10-CM

## 2023-05-17 NOTE — Telephone Encounter (Signed)
Last seen on 10/21/22 "She will continue duloxetine 60mg  BID and Emgality every 30 days. " Follow up scheduled on 05/26/23 Last filled on 04/20/23 # 1ml ( 30 day supply)

## 2023-05-24 DIAGNOSIS — E039 Hypothyroidism, unspecified: Secondary | ICD-10-CM | POA: Diagnosis not present

## 2023-05-24 DIAGNOSIS — E785 Hyperlipidemia, unspecified: Secondary | ICD-10-CM | POA: Diagnosis not present

## 2023-05-24 DIAGNOSIS — E119 Type 2 diabetes mellitus without complications: Secondary | ICD-10-CM | POA: Diagnosis not present

## 2023-05-24 DIAGNOSIS — R296 Repeated falls: Secondary | ICD-10-CM | POA: Diagnosis not present

## 2023-05-26 ENCOUNTER — Ambulatory Visit: Payer: Medicare Other | Admitting: Family Medicine

## 2023-06-01 DIAGNOSIS — E785 Hyperlipidemia, unspecified: Secondary | ICD-10-CM | POA: Diagnosis not present

## 2023-06-01 DIAGNOSIS — E039 Hypothyroidism, unspecified: Secondary | ICD-10-CM | POA: Diagnosis not present

## 2023-06-01 DIAGNOSIS — G43009 Migraine without aura, not intractable, without status migrainosus: Secondary | ICD-10-CM | POA: Diagnosis not present

## 2023-06-01 DIAGNOSIS — R569 Unspecified convulsions: Secondary | ICD-10-CM | POA: Diagnosis not present

## 2023-06-01 DIAGNOSIS — E119 Type 2 diabetes mellitus without complications: Secondary | ICD-10-CM | POA: Diagnosis not present

## 2023-06-01 DIAGNOSIS — R0789 Other chest pain: Secondary | ICD-10-CM | POA: Diagnosis not present

## 2023-06-01 DIAGNOSIS — I10 Essential (primary) hypertension: Secondary | ICD-10-CM | POA: Diagnosis not present

## 2023-06-01 DIAGNOSIS — G4733 Obstructive sleep apnea (adult) (pediatric): Secondary | ICD-10-CM | POA: Diagnosis not present

## 2023-06-01 DIAGNOSIS — Z0001 Encounter for general adult medical examination with abnormal findings: Secondary | ICD-10-CM | POA: Diagnosis not present

## 2023-06-01 DIAGNOSIS — E782 Mixed hyperlipidemia: Secondary | ICD-10-CM | POA: Diagnosis not present

## 2023-06-01 DIAGNOSIS — R296 Repeated falls: Secondary | ICD-10-CM | POA: Diagnosis not present

## 2023-06-06 ENCOUNTER — Other Ambulatory Visit: Payer: Self-pay | Admitting: Neurology

## 2023-06-13 ENCOUNTER — Other Ambulatory Visit: Payer: Self-pay | Admitting: Neurology

## 2023-06-13 DIAGNOSIS — G43709 Chronic migraine without aura, not intractable, without status migrainosus: Secondary | ICD-10-CM

## 2023-06-13 NOTE — Telephone Encounter (Signed)
Last seen on 10/21/22 per note "She will continue duloxetine 60mg  BID and Emgality every 30 days. " No follow up scheduled  Last filled on 05/17/23 1ml (30 day supply)

## 2023-07-05 DIAGNOSIS — H1789 Other corneal scars and opacities: Secondary | ICD-10-CM | POA: Diagnosis not present

## 2023-07-05 DIAGNOSIS — H5213 Myopia, bilateral: Secondary | ICD-10-CM | POA: Diagnosis not present

## 2023-07-05 DIAGNOSIS — Z961 Presence of intraocular lens: Secondary | ICD-10-CM | POA: Diagnosis not present

## 2023-07-08 ENCOUNTER — Other Ambulatory Visit: Payer: Self-pay | Admitting: Neurology

## 2023-07-08 DIAGNOSIS — G43709 Chronic migraine without aura, not intractable, without status migrainosus: Secondary | ICD-10-CM

## 2023-07-11 NOTE — Telephone Encounter (Signed)
Pt needs appt

## 2023-07-28 ENCOUNTER — Other Ambulatory Visit: Payer: Self-pay

## 2023-07-28 ENCOUNTER — Emergency Department (HOSPITAL_COMMUNITY): Payer: 59

## 2023-07-28 ENCOUNTER — Emergency Department (HOSPITAL_COMMUNITY)
Admission: EM | Admit: 2023-07-28 | Discharge: 2023-07-28 | Disposition: A | Discharge status: Home / Self Care | Payer: 59 | Attending: Emergency Medicine | Admitting: Emergency Medicine

## 2023-07-28 DIAGNOSIS — Z79899 Other long term (current) drug therapy: Secondary | ICD-10-CM | POA: Insufficient documentation

## 2023-07-28 DIAGNOSIS — M25571 Pain in right ankle and joints of right foot: Secondary | ICD-10-CM | POA: Diagnosis not present

## 2023-07-28 DIAGNOSIS — M7731 Calcaneal spur, right foot: Secondary | ICD-10-CM | POA: Diagnosis not present

## 2023-07-28 DIAGNOSIS — I10 Essential (primary) hypertension: Secondary | ICD-10-CM | POA: Insufficient documentation

## 2023-07-28 DIAGNOSIS — W1839XA Other fall on same level, initial encounter: Secondary | ICD-10-CM | POA: Diagnosis not present

## 2023-07-28 DIAGNOSIS — Z91040 Latex allergy status: Secondary | ICD-10-CM | POA: Insufficient documentation

## 2023-07-28 DIAGNOSIS — E039 Hypothyroidism, unspecified: Secondary | ICD-10-CM | POA: Diagnosis not present

## 2023-07-28 DIAGNOSIS — G8911 Acute pain due to trauma: Secondary | ICD-10-CM | POA: Insufficient documentation

## 2023-07-28 DIAGNOSIS — E119 Type 2 diabetes mellitus without complications: Secondary | ICD-10-CM | POA: Insufficient documentation

## 2023-07-28 DIAGNOSIS — Z7984 Long term (current) use of oral hypoglycemic drugs: Secondary | ICD-10-CM | POA: Insufficient documentation

## 2023-07-28 DIAGNOSIS — W19XXXA Unspecified fall, initial encounter: Secondary | ICD-10-CM

## 2023-07-28 MED ORDER — CEFADROXIL 500 MG PO CAPS: 500.0000 mg | ORAL_CAPSULE | Freq: Two times a day (BID) | ORAL | 0 refills | Status: AC

## 2023-07-28 NOTE — ED Triage Notes (Signed)
Pt presents for fall from early this morning getting up to use bathroom, fell and twisted right ankle, unable to bare weight on right foot, denies LOC.

## 2023-07-28 NOTE — Discharge Instructions (Addendum)
As discussed, workup today overall reassuring.  X-ray without signs of fracture or dislocation.  I do think that you sprained your right ankle.  Recommend rest, ice, elevation of your right lower leg above the level of your heart as well as using ankle brace for support and compression.  Recommend Tylenol/Motrin for pain.  Regarding the redness of the right lower leg, concern for infectious process; will place you on antibiotics.  Recommend follow-up with orthopedic Dr. Romeo Apple the outpatient setting for reevaluation.  Please do not hesitate to return to the emergency department for worrisome signs and symptoms we discussed to become apparent.

## 2023-07-28 NOTE — ED Provider Notes (Signed)
[place] EMERGENCY DEPARTMENT AT [facility] Provider Note   CSN: [id number] Arrival date & time: 07/28/23  1237     History  Chief Complaint  Patient presents with   [name] is a 67 y.o. female.   Fall   67 year old female presents to the ED with complaints of fall and right ankle pain.  Patient states that she got up to use the restroom earlier this morning and had a mechanical fall twisting her right ankle and landing on her knee.  Patient denies trauma, loss consciousness, anticoagulation use.  Denies any chest pain, shortness of breath, lightheadedness, dizziness prior to fall.  States that she has been able to ambulate on right foot but bearing most of her weight on her right heel and attempting not to move her ankle very much.  Has taken no medication for symptoms.  Denies any chest pain, shortness of breath, abdominal pain, nausea, vomiting.  Past medical history significant for chronic back pain, hypercholesterolemia, hypothyroidism, OSA, diabetes mellitus type 2, seizure  Home Medications Prior to Admission medications   Medication Sig Start Date End Date Taking? Authorizing Provider  cefadroxil (DURICEF) 500 MG capsule Take 1 capsule (500 mg total) by mouth 2 (two) times daily. 07/28/23  Yes Sherian Maroon A, PA  Ascorbic Acid (VITAMIN C GUMMIES PO) Take 1 tablet by mouth daily.    [provider]  atorvastatin (LIPITOR) 40 MG tablet Take 40 mg by mouth at bedtime.  10/15/14   [provider]  Calcium Carbonate (CALCIUM-CARB 600 PO) Take 1 tablet by mouth daily.    [provider]  clobetasol (TEMOVATE) 0.05 % external solution Apply 1 application topically 2 (two) times daily as needed (to scalp for use at 1-2 weeks at one time).  04/04/18   [provider]  DULoxetine (CYMBALTA) 60 MG capsule TAKE ONE CAPSULE BY MOUTH TWICE DAILY 07/11/23   Lomax, [name], NP  ferrous sulfate 325 (65 FE) MG EC tablet Take 325 mg by  mouth daily.    [provider]  Galcanezumab-gnlm (EMGALITY) 120 MG/ML SOAJ Inject contents of ONE pen ONCE every 30 DAYS 07/11/23   Lomax, [name], NP  ibuprofen (ADVIL,MOTRIN) 200 MG tablet Take 200-400 mg by mouth daily as needed for mild pain or moderate pain.     [provider]  levETIRAcetam (KEPPRA) 500 MG tablet TAKE ONE TABLET BY MOUTH AT BREAKFAST AND AT BEDTIME 07/11/23   Lomax, [name], NP  levothyroxine (SYNTHROID, LEVOTHROID) 100 MCG tablet Take 100 mcg by mouth every morning.  12/31/16   [provider]  metFORMIN (GLUCOPHAGE-XR) 500 MG 24 hr tablet Take 500 mg by mouth 2 (two) times daily. 04/29/21   [provider]  metoprolol (LOPRESSOR) 50 MG tablet Take 50 mg by mouth 2 (two) times daily.    [provider]  nystatin cream (MYCOSTATIN) Apply 1 application topically 2 (two) times daily as needed for dry skin. 10/07/19   Lazaro Arms, MD  oxybutynin (DITROPAN) 5 MG tablet TAKE ONE TABLET BY MOUTH TWICE DAILY 09/12/22   Lazaro Arms, MD  POTASSIUM CHLORIDE PO Take 1 tablet by mouth in the morning and at bedtime.    [provider]  vitamin B-12 (CYANOCOBALAMIN) 100 MCG tablet Take 100 mcg by mouth daily.    [provider]  VITAMIN D PO Take 1 tablet by mouth daily.    [provider]  zonisamide (ZONEGRAN) 100 MG capsule  TAKE THREE CAPSULES BY MOUTH EVERYDAY AT BEDTIME 07/11/23   Lomax, [name], NP      Allergies    Aspirin, Codeine, and Latex    Review of Systems   Review of Systems  All other systems reviewed and are negative.   Physical Exam Updated Vital Signs BP (!) 138/98   Pulse 61   Temp 98.9 F (37.2 C) (Oral)   Resp 18   Ht 5\' 4"  (1.626 m)   Wt 99.8 kg   SpO2 98%   BMI 37.76 kg/m  Physical Exam Vitals and nursing note reviewed.  Constitutional:      General: She is not in acute distress.    Appearance: She is well-developed.  HENT:     Head: Normocephalic and atraumatic.  Eyes:      Conjunctiva/sclera: Conjunctivae normal.  Cardiovascular:     Rate and Rhythm: Normal rate and regular rhythm.     Heart sounds: No murmur heard. Pulmonary:     Effort: Pulmonary effort is normal. No respiratory distress.     Breath sounds: Normal breath sounds.  Abdominal:     Palpations: Abdomen is soft.     Tenderness: There is no abdominal tenderness.  Musculoskeletal:        General: No swelling.     Cervical back: Neck supple.     Comments: No midline tenderness of cervical, thoracic, lumbar spine without step-off or deformity noted.  Patient with range of motion of bilateral shoulders, elbows, wrist, digits, hips, knees, ankles, digits.  Swelling appreciated in the lateral aspect of right ankle with ecchymosis.  Area of erythema distal third lateral aspect of right lower extremity that is warm to the touch and tender to palpation.  Area separate from swelling and ecchymosis of right ankle.  Pedal pulses 2+ bilaterally.  Bony tenderness of the right lateral malleolus.  Patient able to range right ankle fully but with pain.  No other tenderness to palpation of upper or lower extremities.  Skin:    General: Skin is warm and dry.     Capillary Refill: Capillary refill takes less than 2 seconds.  Neurological:     Mental Status: She is alert.  Psychiatric:        Mood and Affect: Mood normal.     ED Results / Procedures / Treatments   Labs (all labs ordered are listed, but only abnormal results are displayed) Labs Reviewed - No data to display  EKG None  Radiology DG Ankle Complete Right  Result Date: 07/28/2023 CLINICAL DATA:  Pain after fall EXAM: RIGHT ANKLE - COMPLETE 3 VIEW COMPARISON:  None Available. FINDINGS: Soft tissue swelling. No fracture or dislocation. Preserved joint spaces and bone mineralization. Small well corticated plantar calcaneal spur. IMPRESSION: Soft tissue swelling.  Plantar calcaneal spur. Electronically Signed   By: Karen Kays M.D.   On: 07/28/2023  15:04    Procedures Procedures    Medications Ordered in ED Medications - No data to display  ED Course/ Medical Decision Making/ A&P                                 Medical Decision Making Amount and/or Complexity of Data Reviewed Radiology: ordered.  Risk Prescription drug management.   This patient presents to the ED for concern of fall, this involves an extensive number of treatment options, and is a complaint that carries with it a high risk of complications and  morbidity.  The differential diagnosis includes for fracture, strain/sprain, dislocation, ligamentous/tendinous injury, neurovascular compromise, pneumothorax, solid organ damage   Co morbidities that complicate the patient evaluation  See HPI   Additional history obtained:  Additional history obtained from EMR External records from outside source obtained and reviewed including hospital records   Lab Tests:  N/a   Imaging Studies ordered:  I ordered imaging studies including right foot x-ray I independently visualized and interpreted imaging which showed no acute fracture or dislocation.  Soft tissue swelling. I agree with the radiologist interpretation   Cardiac Monitoring: / EKG:  The patient was maintained on a cardiac monitor.  I personally viewed and interpreted the cardiac monitored which showed an underlying rhythm of: Sinus rhythm   Consultations Obtained:  N/a   Problem List / ED Course / Critical interventions / Medication management  Fall, right ankle pain Reevaluation of the patient showed that the patient stayed the same I have reviewed the patients home medicines and have made adjustments as needed   Social Determinants of Health:  Former cigarette use.  Denies illicit drug use.   Test / Admission - Considered:  Fall, right ankle pain Vitals signs significant for mild hypertension blood pressure 130/98. Otherwise within normal range and stable throughout  visit. Imaging studies significant for: See above 67 year old female presents emergency department after mechanical fall where she twisted her right ankle and fell on her right knee.  On exam, patient without reproducible tenderness on exam besides right-sided ankle.  Area swollen as well as with some ecchymosis but patient still able to range foot with intact neurovascular status distally.  X-ray imaging was obtained which was negative for any acute fracture/dislocation.  Patient reassured by workup and placed in ASO lace up brace and recommend follow-up with orthopedics in the outpatient setting as she already has established care with Dr. Romeo Apple of orthopedics.  Patient also with some cellulitic skin changes more proximally on right lower extremity of which seem to have been present prior to fall.  Will place patient on empiric antibiotics for treatment of cellulitis and have her follow-up with primary care within the next 3 days for reevaluation.  Patient without meeting of SIRS criteria so further workup regarding infectious process deemed unnecessary at this time.  Treatment plan discussed at length with patient and she acknowledged understanding was agreeable to said plan.  Patient overall well-appearing, afebrile in no acute distress. Worrisome signs and symptoms were discussed with the patient, and the patient acknowledged understanding to return to the ED if noticed. Patient was stable upon discharge.          Final Clinical Impression(s) / ED Diagnoses Final diagnoses:  Fall, initial encounter  Acute right ankle pain    Rx / DC Orders ED Discharge Orders          Ordered    cefadroxil (DURICEF) 500 MG capsule  2 times daily        07/28/23 1518              Peter Garter, [place] 07/28/23 1618    Pricilla Loveless, MD 07/29/23 1521

## 2023-08-11 ENCOUNTER — Other Ambulatory Visit: Payer: Self-pay | Admitting: Obstetrics & Gynecology

## 2023-08-16 DIAGNOSIS — R5382 Chronic fatigue, unspecified: Secondary | ICD-10-CM | POA: Insufficient documentation

## 2023-08-16 DIAGNOSIS — L409 Psoriasis, unspecified: Secondary | ICD-10-CM | POA: Insufficient documentation

## 2023-08-18 ENCOUNTER — Ambulatory Visit: Payer: 59 | Admitting: Orthopedic Surgery

## 2023-09-01 ENCOUNTER — Other Ambulatory Visit: Payer: Self-pay

## 2023-09-01 ENCOUNTER — Emergency Department (HOSPITAL_COMMUNITY)
Admission: EM | Admit: 2023-09-01 | Discharge: 2023-09-01 | Disposition: A | Discharge status: Home / Self Care | Payer: 59 | Attending: Emergency Medicine | Admitting: Emergency Medicine

## 2023-09-01 ENCOUNTER — Encounter (HOSPITAL_COMMUNITY): Payer: Self-pay | Admitting: Emergency Medicine

## 2023-09-01 DIAGNOSIS — Z79899 Other long term (current) drug therapy: Secondary | ICD-10-CM | POA: Diagnosis not present

## 2023-09-01 DIAGNOSIS — E119 Type 2 diabetes mellitus without complications: Secondary | ICD-10-CM | POA: Insufficient documentation

## 2023-09-01 DIAGNOSIS — I10 Essential (primary) hypertension: Secondary | ICD-10-CM | POA: Diagnosis not present

## 2023-09-01 DIAGNOSIS — Z91040 Latex allergy status: Secondary | ICD-10-CM | POA: Insufficient documentation

## 2023-09-01 DIAGNOSIS — G43001 Migraine without aura, not intractable, with status migrainosus: Secondary | ICD-10-CM | POA: Diagnosis not present

## 2023-09-01 DIAGNOSIS — R519 Headache, unspecified: Secondary | ICD-10-CM | POA: Diagnosis present

## 2023-09-01 DIAGNOSIS — G43009 Migraine without aura, not intractable, without status migrainosus: Secondary | ICD-10-CM | POA: Diagnosis not present

## 2023-09-01 DIAGNOSIS — Z7984 Long term (current) use of oral hypoglycemic drugs: Secondary | ICD-10-CM | POA: Diagnosis not present

## 2023-09-01 LAB — CBG MONITORING, ED: Glucose-Capillary: 89 mg/dL (ref 70–99)

## 2023-09-01 MED ORDER — OXYCODONE-ACETAMINOPHEN 5-325 MG PO TABS
ORAL_TABLET | ORAL | 0 refills | Status: AC
Start: 2023-09-01 — End: ?

## 2023-09-01 MED ORDER — METOCLOPRAMIDE HCL 5 MG/ML IJ SOLN
10.0000 mg | Freq: Once | INTRAMUSCULAR | Status: AC
  Administered 2023-09-01: 10 mg via INTRAVENOUS
  Filled 2023-09-01: qty 2

## 2023-09-01 MED ORDER — DIPHENHYDRAMINE HCL 50 MG/ML IJ SOLN
25.0000 mg | Freq: Once | INTRAMUSCULAR | Status: AC
  Administered 2023-09-01: 25 mg via INTRAVENOUS
  Filled 2023-09-01: qty 1

## 2023-09-01 MED ORDER — KETOROLAC TROMETHAMINE 30 MG/ML IJ SOLN
30.0000 mg | Freq: Once | INTRAMUSCULAR | Status: AC
  Administered 2023-09-01: 30 mg via INTRAVENOUS
  Filled 2023-09-01: qty 1

## 2023-09-01 NOTE — ED Triage Notes (Signed)
Pt presents with headache since early this morning with nausea, history of migraines.

## 2023-09-01 NOTE — Discharge Instructions (Signed)
Follow-up with your family doctor next week for recheck.

## 2023-09-01 NOTE — ED Provider Notes (Signed)
[place] EMERGENCY DEPARTMENT AT [facility] Provider Note   CSN: [id number] Arrival date & time: 09/01/23  1705     History  Chief Complaint  Patient presents with   Headache    [name] is a 67 y.o. female.  Patient has a history of hypertension, diabetes and severe headaches.  She states she has been having a headache for a few days.  The history is provided by the patient and medical records. No language interpreter was used.  Headache Pain location:  Generalized Quality:  Sharp Radiates to:  Face Severity currently:  7/10 Severity at highest:  8/10 Onset quality:  Sudden Timing:  Constant Progression:  Worsening Chronicity:  New Similar to prior headaches: no   Context: not activity   Associated symptoms: no abdominal pain, no back pain, no congestion, no cough, no diarrhea, no fatigue, no seizures and no sinus pressure        Home Medications Prior to Admission medications   Medication Sig Start Date End Date Taking? Authorizing Provider  oxyCODONE-acetaminophen (PERCOCET/ROXICET) 5-325 MG tablet Take 1 pill every 6 hours for severe headaches that are not helped by Tylenol alone 09/01/23  Yes Bethann Berkshire, MD  Ascorbic Acid (VITAMIN C GUMMIES PO) Take 1 tablet by mouth daily.    [provider]  atorvastatin (LIPITOR) 40 MG tablet Take 40 mg by mouth at bedtime.  10/15/14   [provider]  Calcium Carbonate (CALCIUM-CARB 600 PO) Take 1 tablet by mouth daily.    [provider]  cefadroxil (DURICEF) 500 MG capsule Take 1 capsule (500 mg total) by mouth 2 (two) times daily. 07/28/23   Peter Garter, PA  clobetasol (TEMOVATE) 0.05 % external solution Apply 1 application topically 2 (two) times daily as needed (to scalp for use at 1-2 weeks at one time).  04/04/18   [provider]  DULoxetine (CYMBALTA) 60 MG capsule TAKE ONE CAPSULE BY MOUTH TWICE DAILY 07/11/23   Lomax, [name], NP  ferrous sulfate 325 (65 FE) MG  EC tablet Take 325 mg by mouth daily.    [provider]  Galcanezumab-gnlm (EMGALITY) 120 MG/ML SOAJ Inject contents of ONE pen ONCE every 30 DAYS 07/11/23   Lomax, [name], NP  ibuprofen (ADVIL,MOTRIN) 200 MG tablet Take 200-400 mg by mouth daily as needed for mild pain or moderate pain.     [provider]  levETIRAcetam (KEPPRA) 500 MG tablet TAKE ONE TABLET BY MOUTH AT BREAKFAST AND AT BEDTIME 07/11/23   Lomax, [name], NP  levothyroxine (SYNTHROID, LEVOTHROID) 100 MCG tablet Take 100 mcg by mouth every morning.  12/31/16   [provider]  metFORMIN (GLUCOPHAGE-XR) 500 MG 24 hr tablet Take 500 mg by mouth 2 (two) times daily. 04/29/21   [provider]  metoprolol (LOPRESSOR) 50 MG tablet Take 50 mg by mouth 2 (two) times daily.    [provider]  nystatin cream (MYCOSTATIN) Apply 1 application topically 2 (two) times daily as needed for dry skin. 10/07/19   Lazaro Arms, MD  oxybutynin (DITROPAN) 5 MG tablet TAKE ONE (1) TABLET BY MOUTH TWICE DAILY 08/26/23   Lazaro Arms, MD  POTASSIUM CHLORIDE PO Take 1 tablet by mouth in the morning and at bedtime.    [provider]  vitamin B-12 (CYANOCOBALAMIN) 100 MCG tablet Take 100 mcg by mouth daily.    [provider]  VITAMIN D PO Take 1 tablet by mouth daily.    [provider]  zonisamide (ZONEGRAN) 100 MG capsule TAKE THREE CAPSULES BY MOUTH EVERYDAY AT BEDTIME 07/11/23   Lomax, [name], NP      Allergies    Aspirin, Codeine, and Latex    Review of Systems   Review of Systems  Constitutional:  Negative for appetite change and fatigue.  HENT:  Negative for congestion, ear discharge and sinus pressure.   Eyes:  Negative for discharge.  Respiratory:  Negative for cough.   Cardiovascular:  Negative for chest pain.  Gastrointestinal:  Negative for abdominal pain and diarrhea.  Genitourinary:  Negative for frequency and hematuria.  Musculoskeletal:  Negative for back pain.  Skin:   Negative for rash.  Neurological:  Positive for headaches. Negative for seizures.  Psychiatric/Behavioral:  Negative for hallucinations.     Physical Exam Updated Vital Signs BP 131/82 (BP Location: Left Arm)   Pulse 64   Temp 99 F (37.2 C) (Oral)   Resp 17   Ht 5\' 4"  (1.626 m)   Wt 99.8 kg   SpO2 95%   BMI 37.76 kg/m  Physical Exam Vitals and nursing note reviewed.  Constitutional:      Appearance: She is well-developed.  HENT:     Head: Normocephalic.     Nose: Nose normal.  Eyes:     General: No scleral icterus.    Conjunctiva/sclera: Conjunctivae normal.  Neck:     Thyroid: No thyromegaly.  Cardiovascular:     Rate and Rhythm: Normal rate and regular rhythm.     Heart sounds: No murmur heard.    No friction rub. No gallop.  Pulmonary:     Breath sounds: No stridor. No wheezing or rales.  Chest:     Chest wall: No tenderness.  Abdominal:     General: There is no distension.     Tenderness: There is no abdominal tenderness. There is no rebound.  Musculoskeletal:        General: Normal range of motion.     Cervical back: Neck supple.  Lymphadenopathy:     Cervical: No cervical adenopathy.  Skin:    Findings: No erythema or rash.  Neurological:     Mental Status: She is alert and oriented to person, place, and time.     Motor: No abnormal muscle tone.     Coordination: Coordination normal.  Psychiatric:        Behavior: Behavior normal.     ED Results / Procedures / Treatments   Labs (all labs ordered are listed, but only abnormal results are displayed) Labs Reviewed  CBG MONITORING, ED    EKG None  Radiology No results found.  Procedures Procedures    Medications Ordered in ED Medications  ketorolac (TORADOL) 30 MG/ML injection 30 mg (30 mg Intravenous Given 09/01/23 2112)  metoCLOPramide (REGLAN) injection 10 mg (10 mg Intravenous Given 09/01/23 2114)  diphenhydrAMINE (BENADRYL) injection 25 mg (25 mg Intravenous Given 09/01/23 2113)     ED Course/ Medical Decision Making/ A&P                                 Medical Decision Making Risk Prescription drug management.  Migraine headache that improved with migraine cocktail.  She will follow-up with PCP as needed        Final Clinical Impression(s) / ED Diagnoses Final diagnoses:  Migraine without aura and with status migrainosus, not intractable    Rx / DC Orders ED Discharge Orders  Ordered    oxyCODONE-acetaminophen (PERCOCET/ROXICET) 5-325 MG tablet        09/01/23 2248              Bethann Berkshire, MD 09/03/23 1425

## 2023-09-01 NOTE — ED Notes (Addendum)
Patient reports 10/10 headache that started at 0200 this morning. Reports history of migraines but not this bad. Also reports if headaches get too bad, she has seizures. Patient is on keppra for seizures and has been taking as prescribed. Headache is light and sound sensitive. Reports nausea, no vomiting. Also reports chills

## 2023-09-09 ENCOUNTER — Telehealth: Payer: Self-pay | Admitting: Family Medicine

## 2023-09-09 DIAGNOSIS — G43709 Chronic migraine without aura, not intractable, without status migrainosus: Secondary | ICD-10-CM

## 2023-09-09 NOTE — Telephone Encounter (Signed)
Pt is needing her zonisamide (ZONEGRAN) 100 MG capsule  Galcanezumab-gnlm (EMGALITY) 120 MG/ML SOAJ and her levETIRAcetam (KEPPRA) 500 MG tablet to the [facility].

## 2023-09-12 MED ORDER — EMGALITY 120 MG/ML ~~LOC~~ SOAJ
SUBCUTANEOUS | 0 refills | Status: DC
Start: 2023-09-12 — End: 2024-05-15

## 2023-09-12 MED ORDER — LEVETIRACETAM 500 MG PO TABS: ORAL_TABLET | ORAL | 0 refills | Status: DC

## 2023-09-12 MED ORDER — ZONISAMIDE 100 MG PO CAPS: ORAL_CAPSULE | ORAL | 0 refills | Status: DC

## 2023-09-12 NOTE — Telephone Encounter (Signed)
Last seen on 10/2022  No 6-8 month follow up scheduled  Rx sent with note attached to schedule follow up visit.

## 2023-09-23 DIAGNOSIS — E119 Type 2 diabetes mellitus without complications: Secondary | ICD-10-CM | POA: Diagnosis not present

## 2023-09-23 DIAGNOSIS — R296 Repeated falls: Secondary | ICD-10-CM | POA: Diagnosis not present

## 2023-09-23 DIAGNOSIS — E039 Hypothyroidism, unspecified: Secondary | ICD-10-CM | POA: Diagnosis not present

## 2023-09-23 DIAGNOSIS — E785 Hyperlipidemia, unspecified: Secondary | ICD-10-CM | POA: Diagnosis not present

## 2023-09-29 ENCOUNTER — Other Ambulatory Visit (HOSPITAL_COMMUNITY): Payer: Self-pay | Admitting: Internal Medicine

## 2023-09-29 DIAGNOSIS — E785 Hyperlipidemia, unspecified: Secondary | ICD-10-CM | POA: Diagnosis not present

## 2023-09-29 DIAGNOSIS — I10 Essential (primary) hypertension: Secondary | ICD-10-CM | POA: Diagnosis not present

## 2023-09-29 DIAGNOSIS — R0789 Other chest pain: Secondary | ICD-10-CM | POA: Diagnosis not present

## 2023-09-29 DIAGNOSIS — E119 Type 2 diabetes mellitus without complications: Secondary | ICD-10-CM | POA: Diagnosis not present

## 2023-09-29 DIAGNOSIS — E039 Hypothyroidism, unspecified: Secondary | ICD-10-CM | POA: Diagnosis not present

## 2023-09-29 DIAGNOSIS — G4733 Obstructive sleep apnea (adult) (pediatric): Secondary | ICD-10-CM | POA: Diagnosis not present

## 2023-09-29 DIAGNOSIS — M810 Age-related osteoporosis without current pathological fracture: Secondary | ICD-10-CM

## 2023-09-29 DIAGNOSIS — G629 Polyneuropathy, unspecified: Secondary | ICD-10-CM | POA: Diagnosis not present

## 2023-09-29 DIAGNOSIS — E782 Mixed hyperlipidemia: Secondary | ICD-10-CM | POA: Diagnosis not present

## 2023-09-29 DIAGNOSIS — Z1231 Encounter for screening mammogram for malignant neoplasm of breast: Secondary | ICD-10-CM

## 2023-09-29 DIAGNOSIS — G43009 Migraine without aura, not intractable, without status migrainosus: Secondary | ICD-10-CM | POA: Diagnosis not present

## 2023-09-29 MED ORDER — ZONISAMIDE 100 MG PO CAPS: ORAL_CAPSULE | ORAL | 3 refills | Status: DC

## 2023-09-29 MED ORDER — LEVETIRACETAM 500 MG PO TABS: ORAL_TABLET | ORAL | 3 refills | Status: DC

## 2023-09-29 NOTE — Telephone Encounter (Addendum)
Last seen on 10/21/22 Follow scheduled on 04/10/23 Rx's sent.

## 2023-09-29 NOTE — Telephone Encounter (Signed)
Pt has called stating that she was only able to pick up her Emgality as of this morning and was told by the pharmacy that they did not have any other medication refills( zonisamide (ZONEGRAN) 100 MG capsule  and  levETIRAcetam (KEPPRA) 500 MG tablet  ) showing for her .  Pt states she has been months without this medication.  Pt agreed to scheduling her needed f/u and is on wait list, please call re: medications that according to pt were not called into  at [facility] [place], [place]  on 09/12/23

## 2023-09-29 NOTE — Addendum Note (Signed)
Addended by: Aura Camps on: 09/29/2023 02:24 PM   Modules accepted: Orders

## 2023-10-06 ENCOUNTER — Encounter (HOSPITAL_COMMUNITY): Payer: Self-pay

## 2023-10-06 ENCOUNTER — Ambulatory Visit (HOSPITAL_COMMUNITY)
Admission: RE | Admit: 2023-10-06 | Discharge: 2023-10-06 | Disposition: A | Discharge status: Home / Self Care | Payer: 59 | Source: Ambulatory Visit | Attending: Internal Medicine | Admitting: Internal Medicine

## 2023-10-06 DIAGNOSIS — M810 Age-related osteoporosis without current pathological fracture: Secondary | ICD-10-CM | POA: Diagnosis not present

## 2023-10-06 DIAGNOSIS — Z1231 Encounter for screening mammogram for malignant neoplasm of breast: Secondary | ICD-10-CM | POA: Diagnosis not present

## 2023-11-03 DIAGNOSIS — E039 Hypothyroidism, unspecified: Secondary | ICD-10-CM | POA: Diagnosis not present

## 2023-11-03 DIAGNOSIS — W19XXXA Unspecified fall, initial encounter: Secondary | ICD-10-CM | POA: Diagnosis not present

## 2023-12-08 ENCOUNTER — Other Ambulatory Visit (HOSPITAL_COMMUNITY): Payer: Self-pay | Admitting: Family Medicine

## 2023-12-08 ENCOUNTER — Ambulatory Visit (HOSPITAL_COMMUNITY)
Admission: RE | Admit: 2023-12-08 | Discharge: 2023-12-08 | Disposition: A | Discharge status: Home / Self Care | Payer: 59 | Source: Ambulatory Visit | Attending: Family Medicine | Admitting: Family Medicine

## 2023-12-08 DIAGNOSIS — M47812 Spondylosis without myelopathy or radiculopathy, cervical region: Secondary | ICD-10-CM | POA: Diagnosis not present

## 2023-12-08 DIAGNOSIS — M542 Cervicalgia: Secondary | ICD-10-CM

## 2024-01-02 ENCOUNTER — Ambulatory Visit (HOSPITAL_COMMUNITY): Payer: 59 | Attending: Family Medicine

## 2024-01-02 ENCOUNTER — Other Ambulatory Visit: Payer: Self-pay

## 2024-01-02 ENCOUNTER — Encounter (HOSPITAL_COMMUNITY): Payer: Self-pay

## 2024-01-02 DIAGNOSIS — M542 Cervicalgia: Secondary | ICD-10-CM | POA: Diagnosis not present

## 2024-01-02 DIAGNOSIS — M5382 Other specified dorsopathies, cervical region: Secondary | ICD-10-CM | POA: Insufficient documentation

## 2024-01-02 DIAGNOSIS — M6281 Muscle weakness (generalized): Secondary | ICD-10-CM | POA: Insufficient documentation
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
## 2024-01-05 ENCOUNTER — Ambulatory Visit (HOSPITAL_COMMUNITY): Payer: 59

## 2024-01-05 ENCOUNTER — Encounter (HOSPITAL_COMMUNITY): Payer: Self-pay

## 2024-01-05 DIAGNOSIS — M542 Cervicalgia: Secondary | ICD-10-CM

## 2024-01-05 DIAGNOSIS — M5382 Other specified dorsopathies, cervical region: Secondary | ICD-10-CM | POA: Diagnosis not present

## 2024-01-05 DIAGNOSIS — M6281 Muscle weakness (generalized): Secondary | ICD-10-CM | POA: Diagnosis not present

## 2024-01-05 NOTE — Therapy (Signed)
OUTPATIENT PHYSICAL THERAPY CERVICAL EVALUATION   Patient Name: [name] MRN: [id number] DOB:[birth year], 68 y.o., female Today's Date: 01/05/2024  END OF SESSION:  PT End of Session - 01/05/24 0928     Visit Number 2    Date for PT Re-Evaluation 02/13/24    Authorization Type [employer] Dual Complete    Authorization Time Period no auth; no limit    Progress Note Due on Visit 10    PT Start Time 0847    PT Stop Time 0928    PT Time Calculation (min) 41 min    Activity Tolerance Patient tolerated treatment well    Behavior During Therapy [facility] for tasks assessed/performed              Past Medical History:  Diagnosis Date   Asthma    Chronic back pain    Chronic leg pain    Bilateral   Chronic pelvic pain in female    Essential hypertension    Fibromyalgia    H/O cardiac catheterization    No significant CAD (only 20% LAD) 03/02/13 with normal LV function.   Headache    High cholesterol    Hypothyroidism    OAB (overactive bladder)    Obstructive sleep apnea    Noncompliant with CPAP due to claustophobia.   Panic attacks    Rectocele    Seizures (HCC)    1 severe seizure 2015; has had "small ones" since including 9/18   Type 2 diabetes mellitus (HCC)    Past Surgical History:  Procedure Laterality Date   ABDOMINAL HYSTERECTOMY     ANKLE SURGERY     BACK SURGERY     CARPAL TUNNEL RELEASE     knee replacements     bilateral   LEFT HEART CATHETERIZATION WITH CORONARY ANGIOGRAM N/A 03/02/2013   Procedure: LEFT HEART CATHETERIZATION WITH CORONARY ANGIOGRAM;  Surgeon: Peter M Swaziland, MD;  Location: [facility] CATH LAB;  Service: Cardiovascular;  Laterality: N/A;   TONSILLECTOMY     TUBAL LIGATION     Patient Active Problem List   Diagnosis Date Noted   Chronic fatigue 08/16/2023   Psoriasis 08/16/2023   Chronic migraine w/o aura, not intractable, w/o stat migr 11/20/2020   Diabetic neuropathy (HCC) 12/20/2019   Current moderate episode of major depressive disorder  without prior episode (HCC) 12/20/2019   Myalgia 12/20/2019   SUI (stress urinary incontinence, female) 05/05/2018   Screening for colorectal cancer 05/05/2018   Encounter for well woman exam with routine gynecological exam 05/05/2018   Breast pain, right 05/05/2018   Insomnia 09/13/2017   Restless leg 09/13/2017   Superficial fungus infection of skin 05/04/2017   Bruising 10/14/2016   Chest pain 10/04/2016   Hypothyroidism 10/04/2016   Idiopathic intracranial hypertension 11/19/2015   Papilledema 09/08/2015   Left facial numbness 08/22/2015   Epilepsy, generalized, convulsive (HCC) 06/05/2015   Obstructive sleep apnea 06/05/2015   Headache, occipital 06/05/2015   Neck pain 06/05/2015   Occipital neuralgia of right side 06/05/2015   Seizure (HCC) 07/21/2014   Rectocele 05/21/2013   OAB (overactive bladder) 05/21/2013   Difficulty walking 04/11/2013   Leg weakness, bilateral 04/11/2013   Diabetes (HCC) 02/28/2013   Morbid obesity (HCC) 02/28/2013   Hyperlipidemia 02/28/2013   Chronic osteomyelitis of ankle and foot (HCC) 07/22/2009   Hypertension 07/22/2009    PCP: Benita Stabile, MD  REFERRING PROVIDER: Lupita Raider, NP  REFERRING DIAG: M54.2 (ICD-10-CM) - Cervicalgia   THERAPY DIAG:  Cervicalgia  Impaired  range of motion of cervical spine  Rationale for Evaluation and Treatment: Rehabilitation  ONSET DATE: 1 month ago  SUBJECTIVE:                                                                                                                                                                                                         SUBJECTIVE STATEMENT: Pt reported 7/10 pain today. Pt reports that pain has started due to the postural changes HEP.   Hand dominance: Right  PERTINENT HISTORY:  Hx of Seizures Bilateral knee replacements Back surgery DM2 HTN Thryoid issues Cholesterol  PAIN:  Are you having pain? Yes: NPRS scale: 5-6/10 Pain location: R  cervical spine and UT Pain description: sharp, constant Aggravating factors: Cervical flexion/extension Relieving factors: heating pad, very still  PRECAUTIONS: None  RED FLAGS: Cervical red flags: Dysphagia No, Dysmetria No, Diplopia No, Nystagmus No, and Nausea No     WEIGHT BEARING RESTRICTIONS: No  FALLS:  Has patient fallen in last 6 months? No  PATIENT GOALS: get rid of the pain  OBJECTIVE:  Note: Objective measures were completed at Evaluation unless otherwise noted.  DIAGNOSTIC FINDINGS:    12/08/2023 IMPRESSION: 1. No fracture or acute finding. 2. Mild degenerative changes as detailed.  PATIENT SURVEYS:  FOTO 46.5%  COGNITION: Overall cognitive status: Within functional limits for tasks assessed  SENSATION: WFL  POSTURE: rounded shoulders, forward head, and increased thoracic kyphosis  PALPATION: Tender to palpate-suboccipitals, bilateral UT with R>L   CERVICAL ROM:   Active ROM A/PROM (deg) eval  Flexion 30  Extension 10  Right lateral flexion 20  Left lateral flexion 19  Right rotation 40  Left rotation 70   (Blank rows = not tested)  UPPER EXTREMITY ROM:  Active ROM Right eval Left eval  Shoulder flexion    Shoulder extension    Shoulder abduction    Shoulder adduction    Shoulder extension    Shoulder internal rotation    Shoulder external rotation    Elbow flexion    Elbow extension    Wrist flexion    Wrist extension    Wrist ulnar deviation    Wrist radial deviation    Wrist pronation    Wrist supination     (Blank rows = not tested)  UPPER EXTREMITY MMT:  MMT Right eval Left eval  Shoulder flexion 3+ 3+  Shoulder extension    Shoulder abduction 3+ 3+  Shoulder adduction    Shoulder extension    Shoulder internal rotation    Shoulder external rotation 3+ 3+  Middle trapezius 3+ 3+  Lower trapezius 2 2  Elbow flexion    Elbow extension    Wrist flexion    Wrist extension    Wrist ulnar deviation    Wrist  radial deviation    Wrist pronation    Wrist supination    Grip strength     (Blank rows = not tested)  CERVICAL SPECIAL TESTS:  Neck flexor muscle endurance test: Positive, Spurling's test: Negative, and Distraction test: Negative    TREATMENT DATE:  01/05/2024  -Seated Cervical retraction with tactile cues and facilitation for posterior movement. X 30 -STM x 8' suboccipital release with bilateral UT and scalene passive stretching and mobility -Supine cervical retraction 20x with 3'' iso -Supine cervical rotation bilaterally with cues for cervical retraction x 10 with 3'' -Prone scapular retraction with thoracic extension and neutral cervical motion in sagittal plane x 10 with 3'' iso  01/02/2024 PT Evaluation X8' manual therapy with suboccipital release, PROM in supine, and UT STM                                                                                                                              Neck Flexor endurance test: 1 second   PATIENT EDUCATION:  Education details: PT Evaluation, findings, prognosis, frequency, attendance policy, and HEP if given.  Person educated: Patient Education method: Medical illustrator Education comprehension: verbalized understanding  HOME EXERCISE PROGRAM: Access Code: [email or web address] URL: [email or web address] Date: 01/05/2024 Prepared by: Starling Manns  Exercises - Correct Seated Posture  - 1 x daily - 7 x weekly - 3 sets - 10 reps - 30 hold - Seated Cervical Retraction  - 1 x daily - 7 x weekly - 3 sets - 10 reps - Seated Scapular Retraction  - 1 x daily - 7 x weekly - 3 sets - 10 reps - Seated Upper Trapezius Stretch  - 1 x daily - 7 x weekly - 3 sets - 10 reps - Seated Cervical Rotation AROM  - 1 x daily - 7 x weekly - 3 sets - 10 reps - Prone Upper Back Extension Off Table with Hands Behind Head  - 1 x daily - 7 x weekly - 3 sets - 10 reps - Standing Shoulder Horizontal Abduction with Resistance  - 1 x daily  - 7 x weekly - 3 sets - 10 reps  ASSESSMENT:  CLINICAL IMPRESSION: Pt tolerating first treatment session well. Assessed BUE MMT and postural alignment with low level AROM intervention to ease pt into proper neutral cervical alignment. With cues for mild cervical retraction, improved cervical rotation noted in supine with ease of movement and reduced pain. Pt shows significant BUE weakness, especially and posterior scapular region. Pt naturally shows mild R head tilt, this is a posturing mechancism as pt is more painful and guarding in Right upper trapezius area. Pt will benefit from skilled Physical Therapy services to address deficits/limitations in order to improve functional and QOL. Marland Kitchen  OBJECTIVE IMPAIRMENTS: decreased mobility, decreased ROM, decreased strength, hypomobility, impaired flexibility, postural dysfunction, obesity, and pain.   ACTIVITY LIMITATIONS: carrying, bending, sitting, standing, squatting, sleeping, dressing, reach over head, and hygiene/grooming  PARTICIPATION LIMITATIONS: laundry, driving, shopping, community activity, occupation, and yard work  PERSONAL FACTORS: Age and 3+ comorbidities: see above  are also affecting patient's functional outcome.   REHAB POTENTIAL: Good  CLINICAL DECISION MAKING: Evolving/moderate complexity  EVALUATION COMPLEXITY: Moderate   GOALS: Goals reviewed with patient? No  SHORT TERM GOALS: Target date: 01/23/2024  Pt will be independent with HEP in order to demonstrate participation in Physical Therapy POC.  Baseline: Goal status: INITIAL  2.  Pt will demonstrate proper UB thoracic/cervical postural alignment for > 30 seconds in order improve pain ratings with ADLs.  Baseline:  Goal status: INITIAL  LONG TERM GOALS: Target date: 02/13/2024  Pt will improve Deep Neck Flexor Endurance test by > 10 seconds in order to demonstrate improved functional strength to return to desired activities.  Baseline: See objective.  Goal  status: INITIAL  2.  Pt will improve Cervical ROM by > 15 degrees in order to improve functional mobility during ADLs.  Baseline: See objective.  Goal status: INITIAL  3.  Pt will improve FOTO score by >10 points in order to demonstrate improved pain with functional goals and outcomes. Baseline: See objective.  Goal status: INITIAL  4.  Pt will demonstrate proper UB thoracic/cervical postural alignment for > 90 seconds in order improve pain ratings with ADLs Baseline: See objective.  Goal status: INITIAL  5.  Pt will increased BUE MMT > 1/2 MMT grade in order to demonstrate improved endurance and reduced pain with ADLs. Baseline: See objective.  Goal status: INITIAL   PLAN:  PT FREQUENCY: 2x/week  PT DURATION: 6 weeks  PLANNED INTERVENTIONS: 97164- PT Re-evaluation, 97110-Therapeutic exercises, 97530- Therapeutic activity, [id number]- Neuromuscular re-education, 97535- Self Care, 16109- Manual therapy, [id number]- Gait training, 97014- Electrical stimulation (unattended), [id number]- Electrical stimulation (manual), [id number]- Traction (mechanical), Patient/Family education, Balance training, Cryotherapy, and Moist heat  PLAN FOR NEXT SESSION: BUE MMT, postural strengthening, cervical retraction  Elie Goody, DPT [facility] [place] 336 [phone] office   Nelida Meuse, PT 01/05/2024, 9:30 AM

## 2024-01-06 DIAGNOSIS — E039 Hypothyroidism, unspecified: Secondary | ICD-10-CM | POA: Diagnosis not present

## 2024-01-06 DIAGNOSIS — E785 Hyperlipidemia, unspecified: Secondary | ICD-10-CM | POA: Diagnosis not present

## 2024-01-06 DIAGNOSIS — E559 Vitamin D deficiency, unspecified: Secondary | ICD-10-CM | POA: Diagnosis not present

## 2024-01-06 DIAGNOSIS — E1169 Type 2 diabetes mellitus with other specified complication: Secondary | ICD-10-CM | POA: Diagnosis not present

## 2024-01-09 ENCOUNTER — Encounter (HOSPITAL_COMMUNITY): Payer: 59

## 2024-01-09 ENCOUNTER — Encounter (HOSPITAL_COMMUNITY): Payer: Self-pay

## 2024-01-09 NOTE — Therapy (Incomplete)
OUTPATIENT PHYSICAL THERAPY CERVICAL EVALUATION   Patient Name: [name] MRN: [id number] DOB:1956-02-19, 68 y.o., female Today's Date: 01/09/2024  END OF SESSION:     Past Medical History:  Diagnosis Date   Asthma    Chronic back pain    Chronic leg pain    Bilateral   Chronic pelvic pain in female    Essential hypertension    Fibromyalgia    H/O cardiac catheterization    No significant CAD (only 20% LAD) 03/02/13 with normal LV function.   Headache    High cholesterol    Hypothyroidism    OAB (overactive bladder)    Obstructive sleep apnea    Noncompliant with CPAP due to claustophobia.   Panic attacks    Rectocele    Seizures (HCC)    1 severe seizure 2015; has had "small ones" since including 9/18   Type 2 diabetes mellitus (HCC)    Past Surgical History:  Procedure Laterality Date   ABDOMINAL HYSTERECTOMY     ANKLE SURGERY     BACK SURGERY     CARPAL TUNNEL RELEASE     knee replacements     bilateral   LEFT HEART CATHETERIZATION WITH CORONARY ANGIOGRAM N/A 03/02/2013   Procedure: LEFT HEART CATHETERIZATION WITH CORONARY ANGIOGRAM;  Surgeon: Peter M Swaziland, MD;  Location: [facility] CATH LAB;  Service: Cardiovascular;  Laterality: N/A;   TONSILLECTOMY     TUBAL LIGATION     Patient Active Problem List   Diagnosis Date Noted   Chronic fatigue 08/16/2023   Psoriasis 08/16/2023   Chronic migraine w/o aura, not intractable, w/o stat migr 11/20/2020   Diabetic neuropathy (HCC) 12/20/2019   Current moderate episode of major depressive disorder without prior episode (HCC) 12/20/2019   Myalgia 12/20/2019   SUI (stress urinary incontinence, female) 05/05/2018   Screening for colorectal cancer 05/05/2018   Encounter for well woman exam with routine gynecological exam 05/05/2018   Breast pain, right 05/05/2018   Insomnia 09/13/2017   Restless leg 09/13/2017   Superficial fungus infection of skin 05/04/2017   Bruising 10/14/2016   Chest pain 10/04/2016    Hypothyroidism 10/04/2016   Idiopathic intracranial hypertension 11/19/2015   Papilledema 09/08/2015   Left facial numbness 08/22/2015   Epilepsy, generalized, convulsive (HCC) 06/05/2015   Obstructive sleep apnea 06/05/2015   Headache, occipital 06/05/2015   Neck pain 06/05/2015   Occipital neuralgia of right side 06/05/2015   Seizure (HCC) 07/21/2014   Rectocele 05/21/2013   OAB (overactive bladder) 05/21/2013   Difficulty walking 04/11/2013   Leg weakness, bilateral 04/11/2013   Diabetes (HCC) 02/28/2013   Morbid obesity (HCC) 02/28/2013   Hyperlipidemia 02/28/2013   Chronic osteomyelitis of ankle and foot (HCC) 07/22/2009   Hypertension 07/22/2009    PCP: Benita Stabile, MD  REFERRING PROVIDER: Lupita Raider, NP  REFERRING DIAG: M54.2 (ICD-10-CM) - Cervicalgia   THERAPY DIAG:  No diagnosis found.  Rationale for Evaluation and Treatment: Rehabilitation  ONSET DATE: 1 month ago  SUBJECTIVE:  SUBJECTIVE STATEMENT: ***.   Hand dominance: Right  PERTINENT HISTORY:  Hx of Seizures Bilateral knee replacements Back surgery DM2 HTN Thryoid issues Cholesterol  PAIN:  Are you having pain? Yes: NPRS scale: 5-6/10 Pain location: R cervical spine and UT Pain description: sharp, constant Aggravating factors: Cervical flexion/extension Relieving factors: heating pad, very still  PRECAUTIONS: None  RED FLAGS: Cervical red flags: Dysphagia No, Dysmetria No, Diplopia No, Nystagmus No, and Nausea No     WEIGHT BEARING RESTRICTIONS: No  FALLS:  Has patient fallen in last 6 months? No  PATIENT GOALS: get rid of the pain  OBJECTIVE:  Note: Objective measures were completed at Evaluation unless otherwise noted.  DIAGNOSTIC FINDINGS:    12/08/2023 IMPRESSION: 1. No  fracture or acute finding. 2. Mild degenerative changes as detailed.  PATIENT SURVEYS:  FOTO 46.5%  COGNITION: Overall cognitive status: Within functional limits for tasks assessed  SENSATION: WFL  POSTURE: rounded shoulders, forward head, and increased thoracic kyphosis  PALPATION: Tender to palpate-suboccipitals, bilateral UT with R>L   CERVICAL ROM:   Active ROM A/PROM (deg) eval  Flexion 30  Extension 10  Right lateral flexion 20  Left lateral flexion 19  Right rotation 40  Left rotation 70   (Blank rows = not tested)  UPPER EXTREMITY ROM:  Active ROM Right eval Left eval  Shoulder flexion    Shoulder extension    Shoulder abduction    Shoulder adduction    Shoulder extension    Shoulder internal rotation    Shoulder external rotation    Elbow flexion    Elbow extension    Wrist flexion    Wrist extension    Wrist ulnar deviation    Wrist radial deviation    Wrist pronation    Wrist supination     (Blank rows = not tested)  UPPER EXTREMITY MMT:  MMT Right eval Left eval  Shoulder flexion 3+ 3+  Shoulder extension    Shoulder abduction 3+ 3+  Shoulder adduction    Shoulder extension    Shoulder internal rotation    Shoulder external rotation 3+ 3+  Middle trapezius 3+ 3+  Lower trapezius 2 2  Elbow flexion    Elbow extension    Wrist flexion    Wrist extension    Wrist ulnar deviation    Wrist radial deviation    Wrist pronation    Wrist supination    Grip strength     (Blank rows = not tested)  CERVICAL SPECIAL TESTS:  Neck flexor muscle endurance test: Positive, Spurling's test: Negative, and Distraction test: Negative    TREATMENT DATE:  01/09/2024  -*** -*** -*** -*** -***  01/05/2024  -Seated Cervical retraction with tactile cues and facilitation for posterior movement. X 30 -STM x 8' suboccipital release with bilateral UT and scalene passive stretching and mobility -Supine cervical retraction 20x with 3'' iso -Supine  cervical rotation bilaterally with cues for cervical retraction x 10 with 3'' -Prone scapular retraction with thoracic extension and neutral cervical motion in sagittal plane x 10 with 3'' iso  01/02/2024 PT Evaluation X8' manual therapy with suboccipital release, PROM in supine, and UT STM  Neck Flexor endurance test: 1 second   PATIENT EDUCATION:  Education details: PT Evaluation, findings, prognosis, frequency, attendance policy, and HEP if given.  Person educated: Patient Education method: Medical illustrator Education comprehension: verbalized understanding  HOME EXERCISE PROGRAM: Access Code: [email or web address] URL: [email or web address] Date: 01/05/2024 Prepared by: Starling Manns  Exercises - Correct Seated Posture  - 1 x daily - 7 x weekly - 3 sets - 10 reps - 30 hold - Seated Cervical Retraction  - 1 x daily - 7 x weekly - 3 sets - 10 reps - Seated Scapular Retraction  - 1 x daily - 7 x weekly - 3 sets - 10 reps - Seated Upper Trapezius Stretch  - 1 x daily - 7 x weekly - 3 sets - 10 reps - Seated Cervical Rotation AROM  - 1 x daily - 7 x weekly - 3 sets - 10 reps - Prone Upper Back Extension Off Table with Hands Behind Head  - 1 x daily - 7 x weekly - 3 sets - 10 reps - Standing Shoulder Horizontal Abduction with Resistance  - 1 x daily - 7 x weekly - 3 sets - 10 reps  ASSESSMENT:  CLINICAL IMPRESSION: ***. Pt will benefit from skilled Physical Therapy services to address deficits/limitations in order to improve functional and QOL. .   OBJECTIVE IMPAIRMENTS: decreased mobility, decreased ROM, decreased strength, hypomobility, impaired flexibility, postural dysfunction, obesity, and pain.   ACTIVITY LIMITATIONS: carrying, bending, sitting, standing, squatting, sleeping, dressing, reach over head, and hygiene/grooming  PARTICIPATION  LIMITATIONS: laundry, driving, shopping, community activity, occupation, and yard work  PERSONAL FACTORS: Age and 3+ comorbidities: see above  are also affecting patient's functional outcome.   REHAB POTENTIAL: Good  CLINICAL DECISION MAKING: Evolving/moderate complexity  EVALUATION COMPLEXITY: Moderate   GOALS: Goals reviewed with patient? No  SHORT TERM GOALS: Target date: 01/23/2024  Pt will be independent with HEP in order to demonstrate participation in Physical Therapy POC.  Baseline: Goal status: INITIAL  2.  Pt will demonstrate proper UB thoracic/cervical postural alignment for > 30 seconds in order improve pain ratings with ADLs.  Baseline:  Goal status: INITIAL  LONG TERM GOALS: Target date: 02/13/2024  Pt will improve Deep Neck Flexor Endurance test by > 10 seconds in order to demonstrate improved functional strength to return to desired activities.  Baseline: See objective.  Goal status: INITIAL  2.  Pt will improve Cervical ROM by > 15 degrees in order to improve functional mobility during ADLs.  Baseline: See objective.  Goal status: INITIAL  3.  Pt will improve FOTO score by >10 points in order to demonstrate improved pain with functional goals and outcomes. Baseline: See objective.  Goal status: INITIAL  4.  Pt will demonstrate proper UB thoracic/cervical postural alignment for > 90 seconds in order improve pain ratings with ADLs Baseline: See objective.  Goal status: INITIAL  5.  Pt will increased BUE MMT > 1/2 MMT grade in order to demonstrate improved endurance and reduced pain with ADLs. Baseline: See objective.  Goal status: INITIAL   PLAN:  PT FREQUENCY: 2x/week  PT DURATION: 6 weeks  PLANNED INTERVENTIONS: 97164- PT Re-evaluation, 97110-Therapeutic exercises, 97530- Therapeutic activity, 97112- Neuromuscular re-education, 97535- Self Care, 32202- Manual therapy, [id number]- Gait training, 97014- Electrical stimulation (unattended), [id number]  Electrical stimulation (manual), [id number]- Traction (mechanical), Patient/Family education, Balance training, Cryotherapy, and Moist heat  PLAN FOR NEXT SESSION: BUE MMT, postural strengthening, cervical retraction  Elie Goody, DPT [facility] Outpatient  Rehabilitation-  [phone] office   Nelida Meuse, PT 01/09/2024, 8:02 AM

## 2024-01-09 NOTE — Therapy (Signed)
[facility] Outpatient Rehabilitation at [facility] [address] [place], [place], [address] Phone: [phone]   Fax:  [phone]  Patient Details  Name: [name] MRN: [id number] Date of Birth: [birth year] Referring Provider:  No ref. provider found  Encounter Date: 01/09/2024  Called pt regarding #1 no show.  Pt reported that she called the upfront staff just before therapist called her. She reported that she was sick with touch of flu. Pt reported that she won't be attending next appointment as well. Pt was informed that this continues to count as no-show #1. She understood.   Nelida Meuse, PT 01/09/2024, 9:21 AM  [facility] Outpatient Rehabilitation at [facility] [address] [place], [place], [address] Phone: [phone]   Fax:  [phone]

## 2024-01-11 ENCOUNTER — Encounter (HOSPITAL_COMMUNITY): Payer: 59

## 2024-01-12 DIAGNOSIS — E119 Type 2 diabetes mellitus without complications: Secondary | ICD-10-CM | POA: Diagnosis not present

## 2024-01-12 DIAGNOSIS — E1140 Type 2 diabetes mellitus with diabetic neuropathy, unspecified: Secondary | ICD-10-CM | POA: Diagnosis not present

## 2024-01-12 DIAGNOSIS — G43009 Migraine without aura, not intractable, without status migrainosus: Secondary | ICD-10-CM | POA: Diagnosis not present

## 2024-01-12 DIAGNOSIS — E039 Hypothyroidism, unspecified: Secondary | ICD-10-CM | POA: Diagnosis not present

## 2024-01-12 DIAGNOSIS — G4733 Obstructive sleep apnea (adult) (pediatric): Secondary | ICD-10-CM | POA: Diagnosis not present

## 2024-01-12 DIAGNOSIS — R0789 Other chest pain: Secondary | ICD-10-CM | POA: Diagnosis not present

## 2024-01-12 DIAGNOSIS — E782 Mixed hyperlipidemia: Secondary | ICD-10-CM | POA: Diagnosis not present

## 2024-01-12 DIAGNOSIS — E785 Hyperlipidemia, unspecified: Secondary | ICD-10-CM | POA: Diagnosis not present

## 2024-01-12 DIAGNOSIS — Z23 Encounter for immunization: Secondary | ICD-10-CM | POA: Diagnosis not present

## 2024-01-12 DIAGNOSIS — I10 Essential (primary) hypertension: Secondary | ICD-10-CM | POA: Diagnosis not present

## 2024-01-16 ENCOUNTER — Encounter (HOSPITAL_COMMUNITY): Payer: Self-pay

## 2024-01-16 ENCOUNTER — Encounter (HOSPITAL_COMMUNITY): Payer: 59

## 2024-01-16 NOTE — Therapy (Signed)
[facility] Outpatient Rehabilitation at [facility] [address] [place], [place], [address] Phone: [phone]   Fax:  [phone]  Patient Details  Name: [name] MRN: [id number] Date of Birth: 04/22/1956 Referring Provider:  No ref. provider found  Encounter Date: 01/16/2024  Pt called regarding no show #2. First call picked up and then hung up. Second call when straight to voicemail. Pt informed/reminded of no show policy via voicemail.   Nelida Meuse, PT 01/16/2024, 9:14 AM  [facility] Outpatient Rehabilitation at [facility] [address] [place], [place], [address] Phone: [phone]   Fax:  [phone]

## 2024-01-16 NOTE — Therapy (Deleted)
[facility] Outpatient Rehabilitation at [facility] [address] [place], [place], [address] Phone: [phone]   Fax:  [phone]  Patient Details  Name: TENIYAH SEIVERT MRN: [id number] Date of Birth: [birth year] Referring Provider:  Benita Stabile, MD  Encounter Date: 01/16/2024  Pt called regarding no show #2. First call picked up and then hung up. Second call when straight to voicemail. Pt informed/reminded of no show policy via voicemail.   Nelida Meuse, PT 01/16/2024, 9:10 AM  [facility] Outpatient Rehabilitation at [facility] [address] [place], [place], [address] Phone: [phone]   Fax:  [phone]

## 2024-01-19 ENCOUNTER — Telehealth (HOSPITAL_COMMUNITY): Payer: Self-pay

## 2024-01-19 ENCOUNTER — Encounter (HOSPITAL_COMMUNITY): Payer: 59

## 2024-01-19 NOTE — Telephone Encounter (Signed)
 3rd no show. Called and left message concerning missed apt today. Included no show policy details and all remaining apts have been cancelled. Included contact number to call for any questions concerning.   Augustin Mclean, LPTA/CLT; CBIS [phone]  Mclean Augustin Amble, PTA 01/19/2024, 9:00 AM

## 2024-01-23 ENCOUNTER — Encounter (HOSPITAL_COMMUNITY): Payer: Self-pay

## 2024-01-23 ENCOUNTER — Encounter (HOSPITAL_COMMUNITY): Payer: 59

## 2024-01-23 NOTE — Therapy (Signed)
 [facility] Outpatient Rehabilitation at [facility] [address] [place], [place], [address] Phone: [phone]   Fax:  [phone]  Patient Details  Name: [name] MRN: [id number] Date of Birth: 08-Feb-1956 Referring Provider:  No ref. provider found  Encounter Date: 01/23/2024  PHYSICAL THERAPY DISCHARGE SUMMARY  Visits from Start of Care: 2  Current functional level related to goals / functional outcomes: Unknown   Remaining deficits: Unknown   Education / Equipment: NA   Patient agrees to discharge. Patient goals were not met. Patient is being discharged due to not returning since the last visit.  Gatha Kaska PT, DPT [facility] [phone] office   Gatha Kaska, PT 01/23/2024, 8:52 AM  [facility] Outpatient Rehabilitation at [facility] [address] [place], [place], [address] Phone: [phone]   Fax:  [phone]

## 2024-01-26 ENCOUNTER — Encounter (INDEPENDENT_AMBULATORY_CARE_PROVIDER_SITE_OTHER): Payer: Self-pay | Admitting: *Deleted

## 2024-01-26 ENCOUNTER — Encounter (HOSPITAL_COMMUNITY): Payer: 59

## 2024-01-30 ENCOUNTER — Encounter (HOSPITAL_COMMUNITY): Payer: 59

## 2024-01-30 DIAGNOSIS — M79672 Pain in left foot: Secondary | ICD-10-CM | POA: Diagnosis not present

## 2024-02-02 ENCOUNTER — Encounter (HOSPITAL_COMMUNITY): Payer: 59

## 2024-02-06 ENCOUNTER — Encounter (HOSPITAL_COMMUNITY): Payer: 59

## 2024-02-09 ENCOUNTER — Encounter (HOSPITAL_COMMUNITY): Payer: 59 | Admitting: Physical Therapy

## 2024-02-13 ENCOUNTER — Telehealth: Payer: Self-pay | Admitting: Family Medicine

## 2024-02-13 ENCOUNTER — Encounter (HOSPITAL_COMMUNITY): Payer: 59

## 2024-02-13 NOTE — Telephone Encounter (Signed)
 LVM and sent mychart msg informing pt of need to reschedule 04/09/24 appt - NP out

## 2024-02-13 NOTE — Telephone Encounter (Signed)
 Rescheduled 05/15/24 at 10:30 am

## 2024-02-16 ENCOUNTER — Encounter (HOSPITAL_COMMUNITY): Payer: 59

## 2024-02-28 ENCOUNTER — Ambulatory Visit (INDEPENDENT_AMBULATORY_CARE_PROVIDER_SITE_OTHER): Admitting: Podiatry

## 2024-02-28 DIAGNOSIS — Q828 Other specified congenital malformations of skin: Secondary | ICD-10-CM | POA: Diagnosis not present

## 2024-02-28 NOTE — Progress Notes (Signed)
 Subjective:  Patient ID: [name], female    DOB: [birth year],  MRN: [id number]  Chief Complaint  Patient presents with   Callouses    68 y.o. female presents with the above complaint.  Patient presents with left submetatarsal 4 porokeratotic lesion painful to touch is progressive gotten worse worse with ambulation for pressure patient would like for me debride history is not able to resolve denies any other acute complaints pain scale 7 out of 10 dull aching nature   Review of Systems: Negative except as noted in the HPI. Denies N/V/F/Ch.  Past Medical History:  Diagnosis Date   Asthma    Chronic back pain    Chronic leg pain    Bilateral   Chronic pelvic pain in female    Essential hypertension    Fibromyalgia    H/O cardiac catheterization    No significant CAD (only 20% LAD) 03/02/13 with normal LV function.   Headache    High cholesterol    Hypothyroidism    OAB (overactive bladder)    Obstructive sleep apnea    Noncompliant with CPAP due to claustophobia.   Panic attacks    Rectocele    Seizures (HCC)    1 severe seizure 2015; has had "small ones" since including 9/18   Type 2 diabetes mellitus (HCC)     Current Outpatient Medications:    Ascorbic Acid (VITAMIN C GUMMIES PO), Take 1 tablet by mouth daily., Disp: , Rfl:    atorvastatin (LIPITOR) 40 MG tablet, Take 40 mg by mouth at bedtime. , Disp: , Rfl:    Calcium Carbonate (CALCIUM-CARB 600 PO), Take 1 tablet by mouth daily., Disp: , Rfl:    cefadroxil (DURICEF) 500 MG capsule, Take 1 capsule (500 mg total) by mouth 2 (two) times daily., Disp: 12 capsule, Rfl: 0   clobetasol (TEMOVATE) 0.05 % external solution, Apply 1 application topically 2 (two) times daily as needed (to scalp for use at 1-2 weeks at one time). , Disp: , Rfl: 2   DULoxetine (CYMBALTA) 60 MG capsule, TAKE ONE CAPSULE BY MOUTH TWICE DAILY, Disp: 60 capsule, Rfl: 0   ferrous sulfate 325 (65 FE) MG EC tablet, Take 325 mg by mouth daily., Disp:  , Rfl:    Galcanezumab-gnlm (EMGALITY) 120 MG/ML SOAJ, Inject contents of ONE pen ONCE every 30 DAYS, Disp: 1 mL, Rfl: 0   ibuprofen (ADVIL,MOTRIN) 200 MG tablet, Take 200-400 mg by mouth daily as needed for mild pain or moderate pain. , Disp: , Rfl:    levETIRAcetam (KEPPRA) 500 MG tablet, TAKE ONE TABLET BY MOUTH AT BREAKFAST AND AT BEDTIME, Disp: 60 tablet, Rfl: 3   levothyroxine (SYNTHROID, LEVOTHROID) 100 MCG tablet, Take 100 mcg by mouth every morning. , Disp: , Rfl:    metFORMIN (GLUCOPHAGE-XR) 500 MG 24 hr tablet, Take 500 mg by mouth 2 (two) times daily., Disp: , Rfl:    metoprolol (LOPRESSOR) 50 MG tablet, Take 50 mg by mouth 2 (two) times daily., Disp: , Rfl:    nystatin cream (MYCOSTATIN), Apply 1 application topically 2 (two) times daily as needed for dry skin., Disp: 30 g, Rfl: 11   oxybutynin (DITROPAN) 5 MG tablet, TAKE ONE (1) TABLET BY MOUTH TWICE DAILY, Disp: 60 tablet, Rfl: 10   oxyCODONE-acetaminophen (PERCOCET/ROXICET) 5-325 MG tablet, Take 1 pill every 6 hours for severe headaches that are not helped by Tylenol alone, Disp: 10 tablet, Rfl: 0   POTASSIUM CHLORIDE PO, Take 1 tablet by mouth in the  morning and at bedtime., Disp: , Rfl:    vitamin B-12 (CYANOCOBALAMIN) 100 MCG tablet, Take 100 mcg by mouth daily., Disp: , Rfl:    VITAMIN D PO, Take 1 tablet by mouth daily., Disp: , Rfl:    zonisamide (ZONEGRAN) 100 MG capsule, TAKE THREE CAPSULES BY MOUTH EVERYDAY AT BEDTIME, Disp: 90 capsule, Rfl: 3  Social History   Tobacco Use  Smoking Status Former   Current packs/day: 0.00   Average packs/day: 1 pack/day for 2.0 years (2.0 ttl pk-yrs)   Types: Cigarettes   Start date: 12/13/1972   Quit date: 12/13/1974   Years since quitting: 49.2  Smokeless Tobacco Never    Allergies  Allergen Reactions   Aspirin Nausea And Vomiting   Codeine Rash   Latex Rash   Objective:  There were no vitals filed for this visit. There is no height or weight on file to calculate  BMI. Constitutional Well developed. Well nourished.  Vascular Dorsalis pedis pulses palpable bilaterally. Posterior tibial pulses palpable bilaterally. Capillary refill normal to all digits.  No cyanosis or clubbing noted. Pedal hair growth normal.  Neurologic Normal speech. Oriented to person, place, and time. Epicritic sensation to light touch grossly present bilaterally.  Dermatologic Left submetatarsal 4 porokeratotic lesion with central nucleated core noted.  No pinpoint bleeding noted upon debridement.  Denies any other acute complaints  Orthopedic: Normal joint ROM without pain or crepitus bilaterally. No visible deformities. No bony tenderness.   Radiographs: None Assessment:   1. Porokeratosis    Plan:  Patient was evaluated and treated and all questions answered.  Left submetatarsal 4 porokeratosis/benign skin lesion -All questions and concerns were discussed with the patient in extensive detail given the amount of pain that she is experiencing she will benefit from aggressive debridement of the lesion using chisel blade handle the lesion was debride down to healthy dry tissue.  No complication noted no pinpoint bleeding noted  No follow-ups on file.

## 2024-03-16 DIAGNOSIS — H5213 Myopia, bilateral: Secondary | ICD-10-CM | POA: Diagnosis not present

## 2024-04-01 ENCOUNTER — Emergency Department (HOSPITAL_COMMUNITY)

## 2024-04-01 ENCOUNTER — Emergency Department (HOSPITAL_COMMUNITY)
Admission: EM | Admit: 2024-04-01 | Discharge: 2024-04-01 | Disposition: A | Discharge status: Home / Self Care | Attending: Emergency Medicine | Admitting: Emergency Medicine

## 2024-04-01 ENCOUNTER — Other Ambulatory Visit: Payer: Self-pay

## 2024-04-01 DIAGNOSIS — M79605 Pain in left leg: Secondary | ICD-10-CM

## 2024-04-01 DIAGNOSIS — M25572 Pain in left ankle and joints of left foot: Secondary | ICD-10-CM | POA: Diagnosis not present

## 2024-04-01 DIAGNOSIS — E119 Type 2 diabetes mellitus without complications: Secondary | ICD-10-CM | POA: Insufficient documentation

## 2024-04-01 DIAGNOSIS — R531 Weakness: Secondary | ICD-10-CM | POA: Diagnosis not present

## 2024-04-01 DIAGNOSIS — E039 Hypothyroidism, unspecified: Secondary | ICD-10-CM | POA: Insufficient documentation

## 2024-04-01 DIAGNOSIS — I10 Essential (primary) hypertension: Secondary | ICD-10-CM | POA: Diagnosis not present

## 2024-04-01 DIAGNOSIS — Z7984 Long term (current) use of oral hypoglycemic drugs: Secondary | ICD-10-CM | POA: Diagnosis not present

## 2024-04-01 DIAGNOSIS — M79662 Pain in left lower leg: Secondary | ICD-10-CM | POA: Diagnosis not present

## 2024-04-01 DIAGNOSIS — L03116 Cellulitis of left lower limb: Secondary | ICD-10-CM | POA: Diagnosis not present

## 2024-04-01 DIAGNOSIS — M7989 Other specified soft tissue disorders: Secondary | ICD-10-CM | POA: Diagnosis not present

## 2024-04-01 DIAGNOSIS — Z91040 Latex allergy status: Secondary | ICD-10-CM | POA: Diagnosis not present

## 2024-04-01 LAB — CBC WITH DIFFERENTIAL/PLATELET
Abs Immature Granulocytes: 0.04 10*3/uL (ref 0.00–0.07)
Basophils Absolute: 0.1 10*3/uL (ref 0.0–0.1)
Basophils Relative: 1 %
Eosinophils Absolute: 0.2 10*3/uL (ref 0.0–0.5)
Eosinophils Relative: 3 %
HCT: 41.1 % (ref 36.0–46.0)
Hemoglobin: 13 g/dL (ref 12.0–15.0)
Immature Granulocytes: 1 %
Lymphocytes Relative: 28 %
Lymphs Abs: 2 10*3/uL (ref 0.7–4.0)
MCH: 29 pg (ref 26.0–34.0)
MCHC: 31.6 g/dL (ref 30.0–36.0)
MCV: 91.5 fL (ref 80.0–100.0)
Monocytes Absolute: 0.7 10*3/uL (ref 0.1–1.0)
Monocytes Relative: 10 %
Neutro Abs: 4 10*3/uL (ref 1.7–7.7)
Neutrophils Relative %: 57 %
Platelets: 252 10*3/uL (ref 150–400)
RBC: 4.49 MIL/uL (ref 3.87–5.11)
RDW: 12.6 % (ref 11.5–15.5)
WBC: 7.1 10*3/uL (ref 4.0–10.5)
nRBC: 0 % (ref 0.0–0.2)

## 2024-04-01 LAB — BASIC METABOLIC PANEL WITH GFR
Anion gap: 9 (ref 5–15)
BUN: 18 mg/dL (ref 8–23)
CO2: 24 mmol/L (ref 22–32)
Calcium: 8.9 mg/dL (ref 8.9–10.3)
Chloride: 104 mmol/L (ref 98–111)
Creatinine, Ser: 0.81 mg/dL (ref 0.44–1.00)
GFR, Estimated: >60 mL/min (ref >60)
Glucose, Bld: 115 mg/dL — ABNORMAL HIGH (ref 70–99)
Potassium: 3.9 mmol/L (ref 3.5–5.1)
Sodium: 137 mmol/L (ref 135–145)

## 2024-04-01 MED ORDER — ENOXAPARIN SODIUM 100 MG/ML IJ SOSY
1.0000 mg/kg | PREFILLED_SYRINGE | Freq: Once | INTRAMUSCULAR | Status: AC
  Administered 2024-04-01: 100 mg via SUBCUTANEOUS
  Filled 2024-04-01: qty 1

## 2024-04-01 MED ORDER — KETOROLAC TROMETHAMINE 15 MG/ML IJ SOLN
15.0000 mg | Freq: Once | INTRAMUSCULAR | Status: AC
  Administered 2024-04-01: 15 mg via INTRAMUSCULAR
  Filled 2024-04-01: qty 1

## 2024-04-01 MED ORDER — CEPHALEXIN 500 MG PO CAPS
500.0000 mg | ORAL_CAPSULE | Freq: Once | ORAL | Status: AC
  Administered 2024-04-01: 500 mg via ORAL
  Filled 2024-04-01: qty 1

## 2024-04-01 MED ORDER — CEPHALEXIN 500 MG PO CAPS: 500.0000 mg | ORAL_CAPSULE | Freq: Four times a day (QID) | ORAL | 0 refills | Status: AC

## 2024-04-01 NOTE — ED Provider Notes (Signed)
 [place] EMERGENCY DEPARTMENT AT [facility] Provider Note   CSN: [id number] Arrival date & time: 04/01/24  1232     History  Chief Complaint  Patient presents with   Leg Pain    [name] is a 68 y.o. female. She has history of diabetes, hypertension, obesity, bilateral leg weakness, seizures, diabetic neuropathy, psoriasis and migraines.  Presents to the ER for evaluation of left lower leg and ankle pain with redness.  States this started about a week ago but developed redness last night.  Denies fever chills but states the leg feels warm.  She states it feels like when she has had cellulitis in the past.  She having nausea or vomiting, no chest pain, no shortness of breath or other complaints.   Leg Pain      Home Medications Prior to Admission medications   Medication Sig Start Date End Date Taking? Authorizing Provider  cephALEXin  (KEFLEX ) 500 MG capsule Take 1 capsule (500 mg total) by mouth 4 (four) times daily for 7 days. 04/01/24 04/08/24 Yes Tucker Steedley A, PA-C  Ascorbic Acid (VITAMIN C GUMMIES PO) Take 1 tablet by mouth daily.    [provider]  atorvastatin  (LIPITOR) 40 MG tablet Take 40 mg by mouth at bedtime.  10/15/14   [provider]  Calcium  Carbonate (CALCIUM -CARB 600 PO) Take 1 tablet by mouth daily.    [provider]  cefadroxil  (DURICEF) 500 MG capsule Take 1 capsule (500 mg total) by mouth 2 (two) times daily. 07/28/23   [place] Butter, PA  clobetasol (TEMOVATE) 0.05 % external solution Apply 1 application topically 2 (two) times daily as needed (to scalp for use at 1-2 weeks at one time).  04/04/18   [provider]  DULoxetine  (CYMBALTA ) 60 MG capsule TAKE ONE CAPSULE BY MOUTH TWICE DAILY 07/11/23   Lomax, [name], NP  ferrous sulfate 325 (65 FE) MG EC tablet Take 325 mg by mouth daily.    [provider]  Galcanezumab -gnlm (EMGALITY ) 120 MG/ML SOAJ Inject contents of ONE pen ONCE every 30 DAYS  09/12/23   Lomax, [name], NP  ibuprofen  (ADVIL ,MOTRIN ) 200 MG tablet Take 200-400 mg by mouth daily as needed for mild pain or moderate pain.     [provider]  levETIRAcetam  (KEPPRA ) 500 MG tablet TAKE ONE TABLET BY MOUTH AT BREAKFAST AND AT BEDTIME 09/29/23   Lomax, [name], NP  levothyroxine  (SYNTHROID , LEVOTHROID) 100 MCG tablet Take 100 mcg by mouth every morning.  12/31/16   [provider]  metFORMIN  (GLUCOPHAGE -XR) 500 MG 24 hr tablet Take 500 mg by mouth 2 (two) times daily. 04/29/21   [provider]  metoprolol  (LOPRESSOR ) 50 MG tablet Take 50 mg by mouth 2 (two) times daily.    [provider]  nystatin  cream (MYCOSTATIN ) Apply 1 application topically 2 (two) times daily as needed for dry skin. 10/07/19   Wendelyn Halter, MD  oxybutynin  (DITROPAN ) 5 MG tablet TAKE ONE (1) TABLET BY MOUTH TWICE DAILY 08/26/23   Wendelyn Halter, MD  oxyCODONE -acetaminophen  (PERCOCET/ROXICET) 5-325 MG tablet Take 1 pill every 6 hours for severe headaches that are not helped by Tylenol  alone 09/01/23   Zammit, Joseph, MD  POTASSIUM CHLORIDE PO Take 1 tablet by mouth in the morning and at bedtime.    [provider]  vitamin B-12 (CYANOCOBALAMIN) 100 MCG tablet Take 100 mcg by mouth daily.    [provider]  VITAMIN D PO Take 1 tablet by  mouth daily.    [provider]  zonisamide  (ZONEGRAN ) 100 MG capsule TAKE THREE CAPSULES BY MOUTH EVERYDAY AT BEDTIME 09/29/23   Lomax, [name], NP      Allergies    Aspirin , Codeine, and Latex    Review of Systems   Review of Systems  Physical Exam Updated Vital Signs BP 132/75   Pulse 74   Temp 98 F (36.7 C) (Oral)   Resp 16   Ht 5\' 4"  (1.626 m)   Wt 101.2 kg   SpO2 99%   BMI 38.28 kg/m  Physical Exam Vitals and nursing note reviewed.  Constitutional:      General: She is not in acute distress.    Appearance: She is well-developed.  HENT:     Head: Normocephalic and atraumatic.     Mouth/Throat:      Mouth: Mucous membranes are moist.  Eyes:     Conjunctiva/sclera: Conjunctivae normal.  Cardiovascular:     Rate and Rhythm: Normal rate and regular rhythm.     Heart sounds: No murmur heard. Pulmonary:     Effort: Pulmonary effort is normal. No respiratory distress.     Breath sounds: Normal breath sounds.  Abdominal:     Palpations: Abdomen is soft.     Tenderness: There is no abdominal tenderness.  Musculoskeletal:        General: No swelling.     Cervical back: Neck supple.     Comments: Mild left ankle and foot edema, no calf tenderness bilaterally, no right calf swelling.  DP and PT pulses are intact bilaterally.  Normal range of motion of left ankle, patient can fully flex, extend pronate and supinate the ankle.  Skin:    General: Skin is warm and dry.     Capillary Refill: Capillary refill takes less than 2 seconds.     Comments: Mild erythema to the anterior lateral aspect of the left ankle with mild swelling, no calf tenderness.    Neurological:     General: No focal deficit present.     Mental Status: She is alert and oriented to person, place, and time.  Psychiatric:        Mood and Affect: Mood normal.     ED Results / Procedures / Treatments   Labs (all labs ordered are listed, but only abnormal results are displayed) Labs Reviewed  BASIC METABOLIC PANEL WITH GFR - Abnormal; Notable for the following components:      Result Value   Glucose, Bld 115 (*)    All other components within normal limits  CBC WITH DIFFERENTIAL/PLATELET  CBC WITH DIFFERENTIAL/PLATELET    EKG None  Radiology DG Ankle Complete Left Result Date: 04/01/2024 CLINICAL DATA:  pain, no trauma EXAM: LEFT ANKLE COMPLETE - 3 VIEW COMPARISON:  None Available. FINDINGS: Postop ORIF lateral malleolus. There is no evidence of fracture, dislocation, or subluxation. There is no evidence of arthropathy or other focal bone abnormality. Soft tissue swelling noted at the ankle mortise. IMPRESSION: Soft  tissue swelling. No acute osseous abnormality identified. Electronically Signed   By: Sydell Eva M.D.   On: 04/01/2024 16:47    Procedures Procedures    Medications Ordered in ED Medications  ketorolac  (TORADOL ) 15 MG/ML injection 15 mg (15 mg Intramuscular Given 04/01/24 1631)  cephALEXin  (KEFLEX ) capsule 500 mg (500 mg Oral Given 04/01/24 1856)  enoxaparin  (LOVENOX ) injection 100 mg (100 mg Subcutaneous Given 04/01/24 1856)    ED Course/ Medical Decision Making/ A&P  Medical Decision Making This patient presents to the ED for concern of left leg pain, this involves an extensive number of treatment options, and is a complaint that carries with it a high risk of complications and morbidity.  The differential diagnosis includes cellulitis, DVT, fracture, septic arthritis, osteoarthritis, inflammatory arthritis, contusion, tendinitis, sprain, strain, other   Co morbidities that complicate the patient evaluation :   Diabetes, hypothyroidism, seizures   Additional history obtained:  Additional history obtained from MR External records from outside source obtained and reviewed including prior notes and labs   Lab Tests:  I Ordered, and personally interpreted labs.  The pertinent results include: CBC is normal, BMP is normal   Imaging Studies ordered:  I ordered imaging studies including x-ray of left ankle which shows no fracture, dislocation or subluxation, soft tissue swelling is noted, prior fixation hardware noted I independently visualized and interpreted imaging within scope of identifying emergent findings  I agree with the radiologist interpretation     Problem List / ED Course / Critical interventions / Medication management  Left leg pain swelling and redness-patient states it feels like when she has had cellulitis in the past, does look consistent with cellulitis but patient also having some mild calf tenderness, asymmetric  swelling, she is concerned about possible DVT as she states her mother died from a massive PE so after discussion of risks and benefits of empiric anticoagulation we empirically anticoagulate her tonight and ordered outpatient ultrasound, discussed with her how to set this up for tomorrow.  Will proceed with treatment for likely cellulitis advised on follow-up and strict return precautions.  Of note her ankle has good range of motion, I am not concerned about septic arthritis at this time she has no white count and no fevers in addition. I ordered medication including Toradol  for pain Reevaluation of the patient after these medicines showed that the patient improved I have reviewed the patients home medicines and have made adjustments as needed   Amount and/or Complexity of Data Reviewed Labs: ordered. Radiology: ordered.  Risk Prescription drug management.           Final Clinical Impression(s) / ED Diagnoses Final diagnoses:  Left leg pain    Rx / DC Orders ED Discharge Orders          Ordered    cephALEXin  (KEFLEX ) 500 MG capsule  4 times daily        04/01/24 1845    Upper Ext Left Venous US        Comments: IMPORTANT PATIENT INSTRUCTIONS:  Your ED provider has recommended an Outpatient Ultrasound.  Please call [phone] to schedule an appointment.  If your appointment is scheduled for a Saturday, Sunday or holiday, please go to the [facility] Emergency Department Registration Desk at least 15 minutes prior to your appointment time and tell them you are there for an ultrasound.    If your appointment is scheduled for a weekday (Monday-Friday), please go directly to the [facility] Radiology Department at least 15 minutes prior to your appointment time and tell them you are there for an ultrasound.  Please call [phone] with questions.   04/01/24 1846              Aimee Houseman, PA-C 04/01/24 2209    Early Glisson, MD 04/02/24 1515

## 2024-04-01 NOTE — ED Triage Notes (Signed)
 Pt to ER states pain to bilateral legs that started approximately 1 week ago. Pt states pain is burning in nature, states left leg is swollen.  No redness or warmth noted. Pt denies known injury.

## 2024-04-01 NOTE — Discharge Instructions (Signed)
 Was a pleasure taking care of you today.  You were seen in the ER for left leg pain and swelling with redness.  As discussed this with does appear to be likely cellulitis.  We are treating with antibiotics.  You can take over-the-counter medication as needed for pain.  We also discussed the possibility of a blood clot developing in your leg, I have ordered an ultrasound.  You can call to set this up for tomorrow ideally.  We are giving you a dose of antibiotics to treat you in case this is a blood block.  We discussed the risks of the blood thinner including severe or life-threatening bleeding, if you developed heavy bleeding that does not stop, , dizziness or other worrisome changes come back to the ER right away.

## 2024-04-02 ENCOUNTER — Ambulatory Visit (HOSPITAL_COMMUNITY)
Admission: RE | Admit: 2024-04-02 | Discharge: 2024-04-02 | Disposition: A | Discharge status: Home / Self Care | Source: Ambulatory Visit | Attending: Physician Assistant | Admitting: Physician Assistant

## 2024-04-02 DIAGNOSIS — M7989 Other specified soft tissue disorders: Secondary | ICD-10-CM | POA: Diagnosis not present

## 2024-04-02 DIAGNOSIS — M79605 Pain in left leg: Secondary | ICD-10-CM | POA: Diagnosis not present

## 2024-04-02 NOTE — ED Provider Notes (Signed)
 Patient returns for next a ultrasound.  I have discussed these negative results with her and her husband.   Dorenda Gandy, MD 04/02/24 1351

## 2024-04-09 ENCOUNTER — Ambulatory Visit: Payer: Medicaid Other | Admitting: Family Medicine

## 2024-04-10 DIAGNOSIS — I872 Venous insufficiency (chronic) (peripheral): Secondary | ICD-10-CM | POA: Diagnosis not present

## 2024-04-10 DIAGNOSIS — E785 Hyperlipidemia, unspecified: Secondary | ICD-10-CM | POA: Diagnosis not present

## 2024-04-10 DIAGNOSIS — E039 Hypothyroidism, unspecified: Secondary | ICD-10-CM | POA: Diagnosis not present

## 2024-04-10 DIAGNOSIS — Z7689 Persons encountering health services in other specified circumstances: Secondary | ICD-10-CM | POA: Diagnosis not present

## 2024-04-10 DIAGNOSIS — E559 Vitamin D deficiency, unspecified: Secondary | ICD-10-CM | POA: Diagnosis not present

## 2024-04-10 DIAGNOSIS — E1169 Type 2 diabetes mellitus with other specified complication: Secondary | ICD-10-CM | POA: Diagnosis not present

## 2024-04-12 DIAGNOSIS — I10 Essential (primary) hypertension: Secondary | ICD-10-CM | POA: Diagnosis not present

## 2024-04-12 DIAGNOSIS — E1169 Type 2 diabetes mellitus with other specified complication: Secondary | ICD-10-CM | POA: Diagnosis not present

## 2024-04-12 DIAGNOSIS — N3281 Overactive bladder: Secondary | ICD-10-CM | POA: Diagnosis not present

## 2024-04-12 DIAGNOSIS — E039 Hypothyroidism, unspecified: Secondary | ICD-10-CM | POA: Diagnosis not present

## 2024-04-12 DIAGNOSIS — R569 Unspecified convulsions: Secondary | ICD-10-CM | POA: Diagnosis not present

## 2024-04-12 DIAGNOSIS — G4733 Obstructive sleep apnea (adult) (pediatric): Secondary | ICD-10-CM | POA: Diagnosis not present

## 2024-04-20 DIAGNOSIS — R569 Unspecified convulsions: Secondary | ICD-10-CM | POA: Diagnosis not present

## 2024-04-20 DIAGNOSIS — I10 Essential (primary) hypertension: Secondary | ICD-10-CM | POA: Diagnosis not present

## 2024-04-20 DIAGNOSIS — G43009 Migraine without aura, not intractable, without status migrainosus: Secondary | ICD-10-CM | POA: Diagnosis not present

## 2024-04-20 DIAGNOSIS — M810 Age-related osteoporosis without current pathological fracture: Secondary | ICD-10-CM | POA: Diagnosis not present

## 2024-04-20 DIAGNOSIS — E1140 Type 2 diabetes mellitus with diabetic neuropathy, unspecified: Secondary | ICD-10-CM | POA: Diagnosis not present

## 2024-04-20 DIAGNOSIS — E119 Type 2 diabetes mellitus without complications: Secondary | ICD-10-CM | POA: Diagnosis not present

## 2024-04-20 DIAGNOSIS — G4733 Obstructive sleep apnea (adult) (pediatric): Secondary | ICD-10-CM | POA: Diagnosis not present

## 2024-04-20 DIAGNOSIS — E782 Mixed hyperlipidemia: Secondary | ICD-10-CM | POA: Diagnosis not present

## 2024-04-20 DIAGNOSIS — E785 Hyperlipidemia, unspecified: Secondary | ICD-10-CM | POA: Diagnosis not present

## 2024-04-20 DIAGNOSIS — E039 Hypothyroidism, unspecified: Secondary | ICD-10-CM | POA: Diagnosis not present

## 2024-04-23 ENCOUNTER — Telehealth: Payer: Self-pay | Admitting: Adult Health

## 2024-04-23 ENCOUNTER — Telehealth: Payer: Self-pay | Admitting: Family Medicine

## 2024-04-23 MED ORDER — MIRABEGRON ER 25 MG PO TB24: 25.0000 mg | ORAL_TABLET | Freq: Every day | ORAL | 3 refills | Status: DC

## 2024-04-23 NOTE — Telephone Encounter (Signed)
 [name]'s PCP is Dr Del Favia who is working with [facility], called wants to change form ditropan  to myrbetriq  die to dry mouth, will rx myrbetriq  25 mg 1 daily  #30 with 3 refills, they will keep check on BP

## 2024-04-23 NOTE — Telephone Encounter (Signed)
 Brook from [employer] called and LVM stating that the pt has been off of two medications that were prescribed to her. Sje would like to know if she should get back on the medications or if she should restart those medications.

## 2024-04-23 NOTE — Telephone Encounter (Signed)
 LVM for [name] to call back.

## 2024-04-23 NOTE — Telephone Encounter (Signed)
 [name]- FYI for upcoming appt in June.   Pt last seen 10/21/22. Next f/u 05/15/24 w/ AL,NP. Dx: Seizure/migraine/OSA  Last OV note:  "[name] is doing fairly well today.  Depression, fibromyalgia pain and sleep are stable. No seizure activity. We will continue levetiracetam  500 mg twice daily and zonisamide  300 mg at bedtime. Seizure precautions reviewed. She will continue duloxetine  60mg  BID and Emgality  every 30 days. She is not using CPAP.  We have reviewed risk of untreated sleep apnea.  I am concerned that this may be contributing to her chronic headaches, chronic pain and intermittent dizziness.  She was encouraged to work on healthy lifestyle habits with regular exercise and well-balanced diet. She will follow up with care team as advised.  She will return to see me in 6-8 months, sooner if needed.  She verbalizes understanding and agreement with this plan. "  I called [facility] back and spoke w/ Ruthann Cover. Rerpots that pt relayed to her that she has not been taking levetiracetam  or zonisamide .  Has not had any seizures. States she missed filling one time and never filled after that. Ruthann Cover will f/u with Dr. Del Favia to see about restarting until pt is seen 05/15/24 at our office. She works directly with Dr. Del Favia. She will also call pt to remind her of appt on 05/15/24.

## 2024-04-24 NOTE — Telephone Encounter (Signed)
 Called [name]. Pt has not been on seizure meds since 08/2023. She did speak with pt and reminded her about upcoming appt 05/15/24.

## 2024-04-24 NOTE — Telephone Encounter (Signed)
 At 12:40 [name] left a vm asking for a call back from [place], [place] she can be reached at [phone]

## 2024-04-26 DIAGNOSIS — H52223 Regular astigmatism, bilateral: Secondary | ICD-10-CM | POA: Diagnosis not present

## 2024-04-26 DIAGNOSIS — H524 Presbyopia: Secondary | ICD-10-CM | POA: Diagnosis not present

## 2024-05-02 DIAGNOSIS — R569 Unspecified convulsions: Secondary | ICD-10-CM | POA: Diagnosis not present

## 2024-05-02 DIAGNOSIS — Q828 Other specified congenital malformations of skin: Secondary | ICD-10-CM | POA: Diagnosis not present

## 2024-05-02 DIAGNOSIS — I10 Essential (primary) hypertension: Secondary | ICD-10-CM | POA: Diagnosis not present

## 2024-05-02 DIAGNOSIS — E782 Mixed hyperlipidemia: Secondary | ICD-10-CM | POA: Diagnosis not present

## 2024-05-02 DIAGNOSIS — E039 Hypothyroidism, unspecified: Secondary | ICD-10-CM | POA: Diagnosis not present

## 2024-05-02 DIAGNOSIS — G43009 Migraine without aura, not intractable, without status migrainosus: Secondary | ICD-10-CM | POA: Diagnosis not present

## 2024-05-02 DIAGNOSIS — N3281 Overactive bladder: Secondary | ICD-10-CM | POA: Diagnosis not present

## 2024-05-02 DIAGNOSIS — G629 Polyneuropathy, unspecified: Secondary | ICD-10-CM | POA: Diagnosis not present

## 2024-05-02 DIAGNOSIS — E1169 Type 2 diabetes mellitus with other specified complication: Secondary | ICD-10-CM | POA: Diagnosis not present

## 2024-05-02 DIAGNOSIS — M810 Age-related osteoporosis without current pathological fracture: Secondary | ICD-10-CM | POA: Diagnosis not present

## 2024-05-09 ENCOUNTER — Encounter (INDEPENDENT_AMBULATORY_CARE_PROVIDER_SITE_OTHER): Payer: Self-pay | Admitting: *Deleted

## 2024-05-14 DIAGNOSIS — E1169 Type 2 diabetes mellitus with other specified complication: Secondary | ICD-10-CM | POA: Diagnosis not present

## 2024-05-14 DIAGNOSIS — I10 Essential (primary) hypertension: Secondary | ICD-10-CM | POA: Diagnosis not present

## 2024-05-14 DIAGNOSIS — N3281 Overactive bladder: Secondary | ICD-10-CM | POA: Diagnosis not present

## 2024-05-14 NOTE — Patient Instructions (Addendum)
 Below is our plan:  We will order EEG. Pending results, we will decide if we need to restart seizure medication. We will not restart Emgality  and zonisamide  since migraines are well managed.   Please make sure you are consistent with timing of seizure medication. I recommend annual visit with primary care provider (PCP) for complete physical and routine blood work. I recommend daily intake of vitamin D (400-800iu) and calcium  (800-1000mg ) for bone health. Discuss Dexa screening with PCP.   According to [place] law, you can not drive unless you are seizure / syncope free for at least 6 months and under physician's care.  Please maintain precautions. Do not participate in activities where a loss of awareness could harm you or someone else. No swimming alone, no tub bathing, no hot tubs, no driving, no operating motorized vehicles (cars, ATVs, motocycles, etc), lawnmowers, power tools or firearms. No standing at heights, such as rooftops, ladders or stairs. Avoid hot objects such as stoves, heaters, open fires. Wear a helmet when riding a bicycle, scooter, skateboard, etc. and avoid areas of traffic. Set your water heater to 120 degrees or less.  SUDEP is the sudden, unexpected death of someone with epilepsy, who was otherwise healthy. In SUDEP cases, no other cause of death is found when an autopsy is done. Each year, more than 1 in 1,000 people with epilepsy die from SUDEP. This is the leading cause of death in people with uncontrolled seizures. Until further answers are available, the best way to prevent SUDEP is to lower your risk by controlling seizures. Research has found that people with all types of epilepsy that experience convulsive seizures can be at risk.  Untreated obstructive sleep apnea when it is moderate to severe can have an adverse impact on cardiovascular health and raise her risk for heart disease, arrhythmias, hypertension, congestive heart failure, stroke and diabetes. Untreated  obstructive sleep apnea causes sleep disruption, nonrestorative sleep, and sleep deprivation. This can have an impact on your day to day functioning and cause daytime sleepiness and impairment of cognitive function, memory loss, mood disturbance, and problems focussing. Using CPAP regularly can improve these symptoms.  Please make sure you are staying well hydrated. I recommend 50-60 ounces daily. Well balanced diet and regular exercise encouraged. Consistent sleep schedule with 6-8 hours recommended.   Please continue follow up with care team as directed.   Follow up with me pending EEG results   You may receive a survey regarding today's visit. I encourage you to leave honest feed back as I do use this information to improve patient care. Thank you for seeing me today!

## 2024-05-14 NOTE — Progress Notes (Unsigned)
 PATIENT: [name] DOB: [birth year]  REASON FOR VISIT: follow up HISTORY FROM: patient  Chief Complaint  Patient presents with   Follow-up    Pt in room 2. Alone. Here for seizure follow up. Pt reports no recent seizure follow up. Pt reports she has been out of medication for a while, states pharmacy said no refills on Rx.     HISTORY OF PRESENT ILLNESS:  05/15/24 ALL: [name] is a 68 y.o. female here today for follow up for dizziness/headaches/seizures.   She was last seen 10/2022 and continued levetiracetam  500mg  BID and zonisamide  300mg  at bedtime. We received a call 04/2024 from [name] stating that she had not taken ASMs since 08/2023. She denies seizure activity. History of GTC seizure August 2015, that was witnessed by her husband. She had no prior seizures.  An EEG was performed and showed 2 runs of generalized spike wave activity, consistent with a generalized seizure disorder.  She had an episode of altered awareness with incontinence in 2016 but no clear seizure activity. She is unaware of any family history of seizures. She has not had head trauma in the past. She had a normal childhood development. She has not had meningitis or encephalitis.  She also stopped Emgality  and zonisamide . She reports having a mild headache 1-2 times a month but easily aborted with ibuprofen . She reports that headaches are in the back of her head on both side and radiates to the top of her head. Described as pressure. Occasional nausea, light or sound sensitivity. Dizziness comes and goes.   She stopped duloxetine  07/2023. She feels that this has helped with depression and sleep in the past but doesn't feel it is needed at this time. She feels that her pain is a little better as well. She continues to have fatigue and sleepiness. She wants to take naps during the day if she gets a chance. She has 6 adults and 4 children in a 4 bedroom home. She takes care of three great grandchildren and  a foster child. She usually goes to bed around 10pm and wakes around 5:45. She reports some nights she doesn't fall asleep until 3-4am. Psychology???  She reports that she has not used her CPAP recently. She reports having bugs in the home and she is scared that the bugs have gotten in the machine.   10/21/22 ALL: [name] is a 68 y.o. female here today for follow up for dizziness/headaches/seizures.    She continues levetiracetam  500mg  BID and zonisamide  300mg  at bedtime. She denies seizure activity. She does not drive much. Only local driving when she has to.    She was referred to [name], [facility] for elevated RF of 117.9. She was seen in consult 06/2022. Immunosuppression therapy not recommended at this time. She will continue to follow.    She continues Emgality  monthly and zonisamide  300mg  at bedtime. She has a mild headache most days. She reports that headaches are in the back of her head on both side and radiates to the top of her head. Described as pressure. She feels that her head is going to explode 3-4 times a month. Occasional nausea, light or sound sensitivity. Dizziness comes and goes. She will take 400mg  ibuprofen  in the mornings and again at night.    She continues duloxetine  60mg  BID. She feels that this has helped with depression and sleep. She feels that her pain is a little better as well. She continues to have  fatigue. She wants to take naps during the day if she gets a chance. She has 6 adults and 4 children in a 4 bedroom home. She takes care of three great grandchildren and a foster child. She usually goes to bed around 10pm and wakes around 5:45. She reports some nights she doesn't fall asleep until 3-4am. PCP referred her to psychiatry/psychology but she has not found a provider in her area.    She reports that she has not used her CPAP recently. She reports having bugs in the home and she is scared that the bugs have gotten in the machine.    HISTORY: (copied from Chloe [name] previous note)  [name] is a 68 year old woman with a h/o a generalized tonic-clonic seizure in 2016 and possible other seizures who also has neck pain,  headaches and obstructive sleep apnea.   Update 05/11/22: She has not had any definite seizure but had a spell with altered awareness and went to the hospital. She had incontinence but no GTC. She was arguing with family t the time. .    She thinks she tolerates the medications well.      She has some headache about 28/30 days a month and many have migrainous features.  She was on Ajovy  and it helped reduce the intensity, though they were still most days.    She last took 2-3 months ago.    She has headache , mostly on the top of the head and the occiput.   Holding head up and bright lights worsen the pain.   Sleep helps.   [name] rooms and staying still helps.    NSAIDs don't help much and pain returns.   She has had the headache x 1 week.  In the past, ONB/TPI only helped that day.  She takes zonisamide  200 mg nightly and Keppra  500 mg po bid for HA and seizure.      In 2018, MRI cervical showed DJD at C6-C7 without spinal stenosis but there was no nerve root compression.   Splenius capitus TPI/GON injection did not help in past.     She is falling some due to poor balance.    She feels bladder function has not changed - She notes improved depression until HA returned.  She has a lot of family stress (takes care of 2 great grand-kids and a foster child and granddaughter lives with her).      She had iritis OD.     She is on drops.   She also has OSA and uses CPAP nightly.   She does not sleep well as her granddaughter works evenings until 1-2 am and she needs to get her 77 yo foster child to school early in the morning.     She has diabetes and notes some numbness in her toes.  This is less troublesome than her other issues.   Seizure history In August 2015, she had generalized tonic-clonic seizure  activity that was witnessed by her husband. She had no prior seizures and  An EEG was performed and showed 2 runs of generalized spike wave activity, consistent with a generalized seizure disorder. She is unaware of any family history of seizures.    She has not had head trauma in the past. She had a normal childhood development. She has not had any meningitis or encephalitis.   Pseudotumor cerebri/headache and neck pain:   Due to headaches and papilledema, she underwent a LP.    CSF pressure was elevated At  26.5 cm.. She reports that her ophthalmologist told her that the optic nerve changes were due to cataracts.   Obstructive sleep apnea: She was diagnosed with OSA around 2015 with an overnight sleep study. She was started on CPAP +6 cm in the past.    However, she had trouble tolerating the mask and stopped.   She is claustophobic so seldom made it through the night with CPAP.     REVIEW OF SYSTEMS: Out of a complete 14 system review of symptoms, the patient complains only of the following symptoms, headaches, seizure, dizziness, chronic pain, depression, all other reviewed systems are negative.   ALLERGIES: Allergies  Allergen Reactions   Aspirin  Nausea And Vomiting   Codeine Rash   Latex Rash    HOME MEDICATIONS: Outpatient Medications Prior to Visit  Medication Sig Dispense Refill   Ascorbic Acid (VITAMIN C GUMMIES PO) Take 1 tablet by mouth daily.     atorvastatin  (LIPITOR) 40 MG tablet Take 40 mg by mouth at bedtime.      Calcium  Carbonate (CALCIUM -CARB 600 PO) Take 1 tablet by mouth daily.     cefadroxil  (DURICEF) 500 MG capsule Take 1 capsule (500 mg total) by mouth 2 (two) times daily. 12 capsule 0   clobetasol (TEMOVATE) 0.05 % external solution Apply 1 application topically 2 (two) times daily as needed (to scalp for use at 1-2 weeks at one time).   2   DULoxetine  (CYMBALTA ) 60 MG capsule TAKE ONE CAPSULE BY MOUTH TWICE DAILY 60 capsule 0   ferrous sulfate 325 (65 FE) MG EC  tablet Take 325 mg by mouth daily.     Galcanezumab -gnlm (EMGALITY ) 120 MG/ML SOAJ Inject contents of ONE pen ONCE every 30 DAYS 1 mL 0   ibuprofen  (ADVIL ,MOTRIN ) 200 MG tablet Take 200-400 mg by mouth daily as needed for mild pain or moderate pain.      levETIRAcetam  (KEPPRA ) 500 MG tablet TAKE ONE TABLET BY MOUTH AT BREAKFAST AND AT BEDTIME 60 tablet 3   levothyroxine  (SYNTHROID , LEVOTHROID) 100 MCG tablet Take 100 mcg by mouth every morning.      metFORMIN  (GLUCOPHAGE -XR) 500 MG 24 hr tablet Take 500 mg by mouth 2 (two) times daily.     metoprolol  (LOPRESSOR ) 50 MG tablet Take 50 mg by mouth 2 (two) times daily.     mirabegron  ER (MYRBETRIQ ) 25 MG TB24 tablet Take 1 tablet (25 mg total) by mouth daily. 30 tablet 3   nystatin  cream (MYCOSTATIN ) Apply 1 application topically 2 (two) times daily as needed for dry skin. 30 g 11   oxyCODONE -acetaminophen  (PERCOCET/ROXICET) 5-325 MG tablet Take 1 pill every 6 hours for severe headaches that are not helped by Tylenol  alone 10 tablet 0   POTASSIUM CHLORIDE PO Take 1 tablet by mouth in the morning and at bedtime.     vitamin B-12 (CYANOCOBALAMIN) 100 MCG tablet Take 100 mcg by mouth daily.     VITAMIN D PO Take 1 tablet by mouth daily.     zonisamide  (ZONEGRAN ) 100 MG capsule TAKE THREE CAPSULES BY MOUTH EVERYDAY AT BEDTIME 90 capsule 3   No facility-administered medications prior to visit.    PAST MEDICAL HISTORY: Past Medical History:  Diagnosis Date   Asthma    Chronic back pain    Chronic leg pain    Bilateral   Chronic pelvic pain in female    Essential hypertension    Fibromyalgia    H/O cardiac catheterization    No significant CAD (only  20% LAD) 03/02/13 with normal LV function.   Headache    High cholesterol    Hypothyroidism    OAB (overactive bladder)    Obstructive sleep apnea    Noncompliant with CPAP due to claustophobia.   Panic attacks    Rectocele    Seizures (HCC)    1 severe seizure 2015; has had "small ones" since  including 9/18   Type 2 diabetes mellitus (HCC)     PAST SURGICAL HISTORY: Past Surgical History:  Procedure Laterality Date   ABDOMINAL HYSTERECTOMY     ANKLE SURGERY     BACK SURGERY     CARPAL TUNNEL RELEASE     knee replacements     bilateral   LEFT HEART CATHETERIZATION WITH CORONARY ANGIOGRAM N/A 03/02/2013   Procedure: LEFT HEART CATHETERIZATION WITH CORONARY ANGIOGRAM;  Surgeon: Peter M Swaziland, MD;  Location: [facility] CATH LAB;  Service: Cardiovascular;  Laterality: N/A;   TONSILLECTOMY     TUBAL LIGATION      FAMILY HISTORY: Family History  Problem Relation Age of Onset   Heart attack Mother    Diabetes Mother    Hypertension Mother    Sick sinus syndrome Brother        Pacemaker   Congenital heart disease Brother    Stroke Brother    Coronary artery disease Brother    Breast cancer Maternal Aunt    Cancer Cousin        leukemia    SOCIAL HISTORY: Social History   Socioeconomic History   Marital status: Married    Spouse name: Not on file   Number of children: Not on file   Years of education: Not on file   Highest education level: Not on file  Occupational History   Occupation: disabled  Tobacco Use   Smoking status: Former    Current packs/day: 0.00    Average packs/day: 1 Greene/day for 2.0 years (2.0 ttl pk-yrs)    Types: Cigarettes    Start date: 12/13/1972    Quit date: 12/13/1974    Years since quitting: 49.4   Smokeless tobacco: Never  Vaping Use   Vaping status: Never Used  Substance and Sexual Activity   Alcohol use: No   Drug use: No   Sexual activity: Not Currently    Birth control/protection: Surgical    Comment: hyst  Other Topics Concern   Not on file  Social History Narrative   Not on file   Social Drivers of Health   Financial Resource Strain: Not on file  Food Insecurity: Not on file  Transportation Needs: Not on file  Physical Activity: Not on file  Stress: Not on file  Social Connections: Unknown (04/27/2022)   Received from  [facility], [facility]   Social Network    Social Network: Not on file  Intimate Partner Violence: Unknown (03/19/2022)   Received from [facility], [facility]   HITS    Physically Hurt: Not on file    Insult or Talk Down To: Not on file    Threaten Physical Harm: Not on file    Scream or Curse: Not on file      PHYSICAL EXAM  Vitals:   05/15/24 0947  BP: 138/78  Pulse: 69  SpO2: 97%  Weight: 245 lb 12.8 oz (111.5 kg)  Height: 5\' 4"  (1.626 m)     Body mass index is 42.19 kg/m.  Generalized: Well developed, in no acute distress  Cardiology: normal rate and rhythm, no murmur  noted Respiratory: clear to auscultation bilaterally  Neurological examination  Mentation: Alert oriented to time, place, history taking. Follows all commands speech and language fluent Cranial nerve II-XII: Pupils were equal round reactive to light. Extraocular movements were full, visual field were full on confrontational test. Facial sensation and strength were normal. Head turning and shoulder shrug  were normal and symmetric. Motor: The motor testing reveals 5 over 5 strength of all 4 extremities. Good symmetric motor tone is noted throughout.  Sensory: Sensory testing is intact to soft touch on all 4 extremities. No evidence of extinction is noted.  Coordination: Cerebellar testing reveals good finger-nose-finger and heel-to-shin bilaterally.  Gait and station: Gait is wide, stable without assistive device.   DIAGNOSTIC DATA (LABS, IMAGING, TESTING) - I reviewed patient records, labs, notes, testing and imaging myself where available.      No data to display           Lab Results  Component Value Date   WBC 7.1 04/01/2024   HGB 13.0 04/01/2024   HCT 41.1 04/01/2024   MCV 91.5 04/01/2024   PLT 252 04/01/2024      Component Value Date/Time   NA 137 04/01/2024 1601   K 3.9 04/01/2024 1601   CL 104 04/01/2024 1601   CO2 24 04/01/2024 1601   GLUCOSE 115 (H) 04/01/2024 1601    BUN 18 04/01/2024 1601   CREATININE 0.81 04/01/2024 1601   CALCIUM  8.9 04/01/2024 1601   PROT 6.7 05/11/2022 1126   ALBUMIN 4.3 05/19/2021 0525   AST 27 05/19/2021 0525   ALT 20 05/19/2021 0525   ALKPHOS 87 05/19/2021 0525   BILITOT 0.9 05/19/2021 0525   GFRNONAA >60 04/01/2024 1601   GFRAA >60 09/06/2019 0027   Lab Results  Component Value Date   CHOL 119 09/05/2017   HDL 40 (L) 09/05/2017   LDLCALC 65 09/05/2017   TRIG 69 09/05/2017   CHOLHDL 3.0 09/05/2017   Lab Results  Component Value Date   HGBA1C 5.3 09/04/2017   Lab Results  Component Value Date   VITAMINB12 736 05/11/2022   Lab Results  Component Value Date   TSH 0.109 (L) 09/04/2017     ASSESSMENT AND PLAN 68 y.o. year old female  has a past medical history of Asthma, Chronic back pain, Chronic leg pain, Chronic pelvic pain in female, Essential hypertension, Fibromyalgia, H/O cardiac catheterization, Headache, High cholesterol, Hypothyroidism, OAB (overactive bladder), Obstructive sleep apnea, Panic attacks, Rectocele, Seizures (HCC), and Type 2 diabetes mellitus (HCC). here with     ICD-10-CM   1. Epilepsy, generalized, convulsive (HCC)  G40.309 EEG adult    2. Chronic migraine w/o aura, not intractable, w/o stat migr  G43.709     3. OSA (obstructive sleep apnea)  G47.33        Roseline is doing fairly well today.  Depression, fibromyalgia pain and sleep are stable. No seizure activity. She stopped all medications 08/2023 after not receiving refills from pharmacy. She is hesitant to restart meds unless necessary. I have reviewed her previous records and EEG results. We will repeat EEG for evaluation. Discussed restarting levetiracetam  pending results and she agreed. Seizure precautions reviewed. Caution with driving. She is not using CPAP.  We have reviewed risk of untreated sleep apnea. She was encouraged to work on healthy lifestyle habits with regular exercise and well-balanced diet. She will follow up with  care team as advised.  She will return to see me in 6-8 months, sooner if needed.  She  verbalizes understanding and agreement with this plan.   Orders Placed This Encounter  Procedures   EEG adult    Standing Status:   Future    Expiration Date:   05/15/2025    Where should this test be performed?:   [facility]    Reason for exam:   Seizure     No orders of the defined types were placed in this encounter.    Terrilyn Fick, FNP-C 05/15/2024, 10:29 AM [facility] [address], [address] [place], [place] [address] [phone]

## 2024-05-15 ENCOUNTER — Encounter: Payer: Self-pay | Admitting: Family Medicine

## 2024-05-15 ENCOUNTER — Ambulatory Visit (INDEPENDENT_AMBULATORY_CARE_PROVIDER_SITE_OTHER): Admitting: Family Medicine

## 2024-05-15 VITALS — BP 138/78 | HR 69 | Ht 64.0 in | Wt 245.8 lb

## 2024-05-15 DIAGNOSIS — G4733 Obstructive sleep apnea (adult) (pediatric): Secondary | ICD-10-CM | POA: Diagnosis not present

## 2024-05-15 DIAGNOSIS — G40309 Generalized idiopathic epilepsy and epileptic syndromes, not intractable, without status epilepticus: Secondary | ICD-10-CM

## 2024-05-15 DIAGNOSIS — G43709 Chronic migraine without aura, not intractable, without status migrainosus: Secondary | ICD-10-CM

## 2024-05-16 ENCOUNTER — Ambulatory Visit (INDEPENDENT_AMBULATORY_CARE_PROVIDER_SITE_OTHER): Admitting: Gastroenterology

## 2024-05-18 ENCOUNTER — Emergency Department (HOSPITAL_COMMUNITY)

## 2024-05-18 ENCOUNTER — Other Ambulatory Visit: Payer: Self-pay

## 2024-05-18 ENCOUNTER — Encounter (HOSPITAL_COMMUNITY): Payer: Self-pay

## 2024-05-18 ENCOUNTER — Emergency Department (HOSPITAL_COMMUNITY)
Admission: EM | Admit: 2024-05-18 | Discharge: 2024-05-18 | Disposition: A | Discharge status: Home / Self Care | Attending: Emergency Medicine | Admitting: Emergency Medicine

## 2024-05-18 DIAGNOSIS — W19XXXA Unspecified fall, initial encounter: Secondary | ICD-10-CM | POA: Diagnosis not present

## 2024-05-18 DIAGNOSIS — W208XXA Other cause of strike by thrown, projected or falling object, initial encounter: Secondary | ICD-10-CM | POA: Insufficient documentation

## 2024-05-18 DIAGNOSIS — S0990XA Unspecified injury of head, initial encounter: Secondary | ICD-10-CM | POA: Insufficient documentation

## 2024-05-18 DIAGNOSIS — S0083XA Contusion of other part of head, initial encounter: Secondary | ICD-10-CM | POA: Insufficient documentation

## 2024-05-18 DIAGNOSIS — Z91040 Latex allergy status: Secondary | ICD-10-CM | POA: Diagnosis not present

## 2024-05-18 DIAGNOSIS — R519 Headache, unspecified: Secondary | ICD-10-CM | POA: Diagnosis present

## 2024-05-18 MED ORDER — HYDROCODONE-ACETAMINOPHEN 5-325 MG PO TABS
1.0000 | ORAL_TABLET | Freq: Once | ORAL | Status: AC
  Administered 2024-05-18: 1 via ORAL
  Filled 2024-05-18: qty 1

## 2024-05-18 NOTE — ED Provider Notes (Signed)
 [place] EMERGENCY DEPARTMENT AT [facility] Provider Note   CSN: [id number] Arrival date & time: 05/18/24  1438     History  Chief Complaint  Patient presents with   Head Injury    [name] is a 68 y.o. female.  Patient is a 68 year old female who presents emergency department the chief complaint of pain to the left side of her head and face.  Patient notes that she was hit in the face with a wooden shelf when it fell over on her.  Patient denies any known history of bleeding disorders or current anticoagulation use.  There was no associated loss of consciousness and she denies any associated dizziness or lightheadedness.  She denies any numbness, paresthesias or weakness throughout.  She denies any pain to her neck or back.  She denies any other sites of injury or pain at this point.   Head Injury Associated symptoms: headache        Home Medications Prior to Admission medications   Medication Sig Start Date End Date Taking? Authorizing Provider  Ascorbic Acid (VITAMIN C GUMMIES PO) Take 1 tablet by mouth daily.    [provider]  atorvastatin  (LIPITOR) 40 MG tablet Take 40 mg by mouth at bedtime.  10/15/14   [provider]  Calcium  Carbonate (CALCIUM -CARB 600 PO) Take 1 tablet by mouth daily.    [provider]  cefadroxil  (DURICEF) 500 MG capsule Take 1 capsule (500 mg total) by mouth 2 (two) times daily. 07/28/23   Spade Butter, PA  clobetasol (TEMOVATE) 0.05 % external solution Apply 1 application topically 2 (two) times daily as needed (to scalp for use at 1-2 weeks at one time).  04/04/18   [provider]  ferrous sulfate 325 (65 FE) MG EC tablet Take 325 mg by mouth daily.    [provider]  ibuprofen  (ADVIL ,MOTRIN ) 200 MG tablet Take 200-400 mg by mouth daily as needed for mild pain or moderate pain.     [provider]  levETIRAcetam  (KEPPRA ) 500 MG tablet TAKE ONE TABLET BY MOUTH AT BREAKFAST  AND AT BEDTIME 09/29/23   Lomax, [name], NP  levothyroxine  (SYNTHROID , LEVOTHROID) 100 MCG tablet Take 100 mcg by mouth every morning.  12/31/16   [provider]  metFORMIN  (GLUCOPHAGE -XR) 500 MG 24 hr tablet Take 500 mg by mouth 2 (two) times daily. 04/29/21   [provider]  metoprolol  (LOPRESSOR ) 50 MG tablet Take 50 mg by mouth 2 (two) times daily.    [provider]  mirabegron  ER (MYRBETRIQ ) 25 MG TB24 tablet Take 1 tablet (25 mg total) by mouth daily. 04/23/24   Javan Messing, NP  nystatin  cream (MYCOSTATIN ) Apply 1 application topically 2 (two) times daily as needed for dry skin. 10/07/19   Wendelyn Halter, MD  oxyCODONE -acetaminophen  (PERCOCET/ROXICET) 5-325 MG tablet Take 1 pill every 6 hours for severe headaches that are not helped by Tylenol  alone 09/01/23   Zammit, Joseph, MD  POTASSIUM CHLORIDE PO Take 1 tablet by mouth in the morning and at bedtime.    [provider]  vitamin B-12 (CYANOCOBALAMIN) 100 MCG tablet Take 100 mcg by mouth daily.    [provider]  VITAMIN D PO Take 1 tablet by mouth daily.    [provider]      Allergies    Aspirin , Codeine, and Latex    Review of Systems   Review of Systems  Neurological:  Positive for headaches.  All  other systems reviewed and are negative.   Physical Exam Updated Vital Signs BP 124/82 (BP Location: Right Arm)   Pulse 86   Temp 98.2 F (36.8 C) (Oral)   Resp 16   Ht 5' 4 (1.626 m)   Wt 111.5 kg   SpO2 92%   BMI 42.19 kg/m  Physical Exam Vitals and nursing note reviewed.  Constitutional:      Appearance: Normal appearance.  HENT:     Head: Normocephalic and atraumatic.     Nose: Nose normal.     Mouth/Throat:     Mouth: Mucous membranes are moist.  Eyes:     Extraocular Movements: Extraocular movements intact.     Conjunctiva/sclera: Conjunctivae normal.     Pupils: Pupils are equal, round, and reactive to light.  Neck:     Comments: No step-off or  deformity Cardiovascular:     Rate and Rhythm: Normal rate and regular rhythm.     Pulses: Normal pulses.     Heart sounds: Normal heart sounds. No murmur heard.    No gallop.  Pulmonary:     Effort: Pulmonary effort is normal. No respiratory distress.     Breath sounds: Normal breath sounds. No stridor. No wheezing, rhonchi or rales.  Chest:     Chest wall: No tenderness.  Abdominal:     General: Abdomen is flat. Bowel sounds are normal. There is no distension.     Palpations: Abdomen is soft.     Tenderness: There is no abdominal tenderness. There is no guarding.  Musculoskeletal:        General: No swelling, tenderness, deformity or signs of injury. Normal range of motion.     Cervical back: Normal range of motion and neck supple. No rigidity or tenderness.  Skin:    General: Skin is warm and dry.     Findings: No rash.     Comments: Bruising over the left side of the forehead  Neurological:     General: No focal deficit present.     Mental Status: She is alert and oriented to person, place, and time. Mental status is at baseline.     Cranial Nerves: No cranial nerve deficit.     Sensory: No sensory deficit.     Motor: No weakness.     Coordination: Coordination normal.     Gait: Gait normal.  Psychiatric:        Mood and Affect: Mood normal.        Behavior: Behavior normal.        Thought Content: Thought content normal.        Judgment: Judgment normal.     ED Results / Procedures / Treatments   Labs (all labs ordered are listed, but only abnormal results are displayed) Labs Reviewed - No data to display  EKG None  Radiology CT Maxillofacial WO CM Result Date: 05/18/2024 CLINICAL DATA:  Facial trauma, wooden shelf struck patient in head EXAM: CT HEAD WITHOUT CONTRAST CT MAXILLOFACIAL WITHOUT CONTRAST TECHNIQUE: Multidetector CT imaging of the head and maxillofacial structures were performed using the standard protocol without intravenous contrast. Multiplanar CT  image reconstructions of the maxillofacial structures were also generated. RADIATION DOSE REDUCTION: This exam was performed according to the departmental dose-optimization program which includes automated exposure control, adjustment of the mA and/or kV according to patient size and/or use of iterative reconstruction technique. COMPARISON:  05/19/2021 FINDINGS: CT HEAD FINDINGS Brain: No evidence of acute infarction, hemorrhage, hydrocephalus, extra-axial collection or mass lesion/mass  effect. Prominent dural spaces (series 4, image 31). Vascular: No hyperdense vessel or unexpected calcification. CT FACIAL BONES FINDINGS Skull: Normal. Negative for fracture or focal lesion. Facial bones: No displaced fractures or dislocations. Sinuses/Orbits: No acute finding. Other: Patient is edentulous. IMPRESSION: 1. No acute intracranial pathology. 2. Prominent dural spaces, which may be related to volume status or global cerebral atrophy. 3. No displaced fractures or dislocations of the facial bones. Electronically Signed   By: Fredricka Jenny M.D.   On: 05/18/2024 15:55   CT Head Wo Contrast Result Date: 05/18/2024 CLINICAL DATA:  Facial trauma, wooden shelf struck patient in head EXAM: CT HEAD WITHOUT CONTRAST CT MAXILLOFACIAL WITHOUT CONTRAST TECHNIQUE: Multidetector CT imaging of the head and maxillofacial structures were performed using the standard protocol without intravenous contrast. Multiplanar CT image reconstructions of the maxillofacial structures were also generated. RADIATION DOSE REDUCTION: This exam was performed according to the departmental dose-optimization program which includes automated exposure control, adjustment of the mA and/or kV according to patient size and/or use of iterative reconstruction technique. COMPARISON:  05/19/2021 FINDINGS: CT HEAD FINDINGS Brain: No evidence of acute infarction, hemorrhage, hydrocephalus, extra-axial collection or mass lesion/mass effect. Prominent dural spaces  (series 4, image 31). Vascular: No hyperdense vessel or unexpected calcification. CT FACIAL BONES FINDINGS Skull: Normal. Negative for fracture or focal lesion. Facial bones: No displaced fractures or dislocations. Sinuses/Orbits: No acute finding. Other: Patient is edentulous. IMPRESSION: 1. No acute intracranial pathology. 2. Prominent dural spaces, which may be related to volume status or global cerebral atrophy. 3. No displaced fractures or dislocations of the facial bones. Electronically Signed   By: Fredricka Jenny M.D.   On: 05/18/2024 15:55    Procedures Procedures    Medications Ordered in ED Medications  HYDROcodone -acetaminophen  (NORCO/VICODIN) 5-325 MG per tablet 1 tablet (1 tablet Oral Given 05/18/24 1601)    ED Course/ Medical Decision Making/ A&P                                 Medical Decision Making Patient is doing well at this time and is stable for discharge home.  Discussed with patient that CT scan of the head and maxillofacial region demonstrated no signs of acute intracranial hemorrhage, skull fracture, facial fracture.  Patient had no tenderness over the cervical, thoracic, lumbar spine.  She had no other long bone or joint pain noted on physical exam.  She was nontender palpation of her chest wall and abdomen.  Patient has no concern neurological deficits at this point.  Do not with any further advanced imaging is warranted at this time.  Close follow-up with PCP was discussed as well as strict turn precautions for any new or worsening symptoms.  Patient voiced understanding to the plan and had no additional questions.  Amount and/or Complexity of Data Reviewed Radiology: ordered.  Risk Prescription drug management.           Final Clinical Impression(s) / ED Diagnoses Final diagnoses:  Injury of head, initial encounter    Rx / DC Orders ED Discharge Orders     None         Roselynn Connors, PA-C 05/18/24 1645    Ninetta Basket,  MD 05/23/24 1230

## 2024-05-18 NOTE — Discharge Instructions (Signed)
 Please follow-up closely with your primary care doctor on an outpatient basis.  You may continue to take Motrin  as needed for your pain.  Return to emergency department immediately for any new or worsening symptoms.

## 2024-05-18 NOTE — ED Triage Notes (Signed)
 Pt reports a wooden shelf tipped over and struck her in the head.  Hematoma to the left side forehead present. Denies any LOC.

## 2024-05-22 ENCOUNTER — Ambulatory Visit: Admitting: Neurology

## 2024-05-22 DIAGNOSIS — G40309 Generalized idiopathic epilepsy and epileptic syndromes, not intractable, without status epilepticus: Secondary | ICD-10-CM

## 2024-05-22 NOTE — Progress Notes (Signed)
   [facility]  EEG (ELECTROENCEPHALOGRAM) REPORT   STUDY DATE: 05/22/2024 PATIENT NAME: [name] DOB: January 31, 1956 MRN: [id number]  ORDERING CLINICIAN: Terrilyn Fick, NP; Sherida Dimmer. Godwin Lat, MD. PhD  TECHNOLOGIST: Arrie Lares, REEGT TECHNIQUE: Electroencephalogram was recorded utilizing standard 10-20 system of lead placement and reformatted into average and bipolar montages.  RECORDING TIME: 26 minutes 27 seconds  CLINICAL INFORMATION: 68 year old woman with history of seizures  FINDINGS: A digital EEG was performed while the patient was awake and drowsy. While awake and most alert there was a 8-9 hz posterior dominant rhythm. Voltages and frequencies were symmetric.  There were several left central spikes or spike waves noted while awake and drowsy.  No organized seizure activity was observed.Aaron Aas  Photic stimulation had a normal driving response. Hyperventilation and recovery did not change the underlying rhythms. EKG channel shows normal sinus rhythm.  Near the end of the record the patient became drowsy but did not enter definitive sleep  IMPRESSION: This EEG while the patient was awake and drowsy showed several left central spikes consistent with a history of complex partial seizures.  There was no organized seizure activity noted.    INTERPRETING PHYSICIAN:   Nikolette Reindl A. Godwin Lat, MD, PhD, [facility] Certified in Neurology, Clinical Neurophysiology, Sleep Medicine, Pain Medicine and Neuroimaging  [facility] [address] [place], [place] [address] [phone]

## 2024-05-23 ENCOUNTER — Telehealth: Payer: Self-pay | Admitting: Family Medicine

## 2024-05-23 MED ORDER — LEVETIRACETAM 500 MG PO TABS: ORAL_TABLET | ORAL | 3 refills | Status: AC

## 2024-05-23 NOTE — Telephone Encounter (Signed)
 Please call her and let her know that Dr Godwin Lat reviewed her EEG and feels she should resume levetiracetam . Although we did not see any specific seizure activity the potential was still apparent. I feel it is the safest option to resume levetiracetam  500mg  twice daily. Rx sent to [facility]. We can schedule follow up in 1 year.

## 2024-05-23 NOTE — Telephone Encounter (Signed)
 Left message for patient to call.

## 2024-05-23 NOTE — Telephone Encounter (Signed)
-----   Message from [name] sent at 05/22/2024  5:29 PM EDT ----- Her EEG did not show any organized seizure activity but did show a couple spikes or spike waves arising out of the left central region.  I see that she discontinued her seizure medication.  I think it would be good to get back on the Keppra  500 mg twice a day

## 2024-05-24 NOTE — Telephone Encounter (Signed)
 Left message for patient to call.

## 2024-05-28 NOTE — Telephone Encounter (Signed)
 Pt returned call. Please call back when available.

## 2024-05-28 NOTE — Telephone Encounter (Addendum)
 Called [phone] and LVM for pt to call.  Called husband on DPR at [phone]. LVM for him to call.

## 2024-05-28 NOTE — Telephone Encounter (Signed)
 Called and spoke w/ pt. Relayed message from [name]. Pt verbalized understanding. She will call pharmacy today to get rx ready for pick up. Scheduled yearly f/u with [name] 05/20/24 at 1pm.

## 2024-06-05 DIAGNOSIS — Q828 Other specified congenital malformations of skin: Secondary | ICD-10-CM | POA: Diagnosis not present

## 2024-06-05 DIAGNOSIS — M810 Age-related osteoporosis without current pathological fracture: Secondary | ICD-10-CM | POA: Diagnosis not present

## 2024-06-05 DIAGNOSIS — E1169 Type 2 diabetes mellitus with other specified complication: Secondary | ICD-10-CM | POA: Diagnosis not present

## 2024-06-05 DIAGNOSIS — R569 Unspecified convulsions: Secondary | ICD-10-CM | POA: Diagnosis not present

## 2024-06-05 DIAGNOSIS — E782 Mixed hyperlipidemia: Secondary | ICD-10-CM | POA: Diagnosis not present

## 2024-06-05 DIAGNOSIS — I10 Essential (primary) hypertension: Secondary | ICD-10-CM | POA: Diagnosis not present

## 2024-06-05 DIAGNOSIS — G629 Polyneuropathy, unspecified: Secondary | ICD-10-CM | POA: Diagnosis not present

## 2024-06-05 DIAGNOSIS — G43009 Migraine without aura, not intractable, without status migrainosus: Secondary | ICD-10-CM | POA: Diagnosis not present

## 2024-06-05 DIAGNOSIS — E039 Hypothyroidism, unspecified: Secondary | ICD-10-CM | POA: Diagnosis not present

## 2024-06-05 DIAGNOSIS — N3281 Overactive bladder: Secondary | ICD-10-CM | POA: Diagnosis not present

## 2024-06-11 DIAGNOSIS — M6281 Muscle weakness (generalized): Secondary | ICD-10-CM | POA: Diagnosis not present

## 2024-06-11 DIAGNOSIS — M25662 Stiffness of left knee, not elsewhere classified: Secondary | ICD-10-CM | POA: Diagnosis not present

## 2024-06-11 DIAGNOSIS — M25672 Stiffness of left ankle, not elsewhere classified: Secondary | ICD-10-CM | POA: Diagnosis not present

## 2024-06-11 DIAGNOSIS — R2689 Other abnormalities of gait and mobility: Secondary | ICD-10-CM | POA: Diagnosis not present

## 2024-06-11 DIAGNOSIS — M25572 Pain in left ankle and joints of left foot: Secondary | ICD-10-CM | POA: Diagnosis not present

## 2024-06-11 DIAGNOSIS — M25562 Pain in left knee: Secondary | ICD-10-CM | POA: Diagnosis not present

## 2024-06-12 DIAGNOSIS — I10 Essential (primary) hypertension: Secondary | ICD-10-CM | POA: Diagnosis not present

## 2024-06-12 DIAGNOSIS — E1165 Type 2 diabetes mellitus with hyperglycemia: Secondary | ICD-10-CM | POA: Diagnosis not present

## 2024-06-12 DIAGNOSIS — N3281 Overactive bladder: Secondary | ICD-10-CM | POA: Diagnosis not present

## 2024-06-12 DIAGNOSIS — E782 Mixed hyperlipidemia: Secondary | ICD-10-CM | POA: Diagnosis not present

## 2024-06-25 DIAGNOSIS — M25662 Stiffness of left knee, not elsewhere classified: Secondary | ICD-10-CM | POA: Diagnosis not present

## 2024-06-25 DIAGNOSIS — R2689 Other abnormalities of gait and mobility: Secondary | ICD-10-CM | POA: Diagnosis not present

## 2024-06-25 DIAGNOSIS — M6281 Muscle weakness (generalized): Secondary | ICD-10-CM | POA: Diagnosis not present

## 2024-06-25 DIAGNOSIS — M25562 Pain in left knee: Secondary | ICD-10-CM | POA: Diagnosis not present

## 2024-06-25 DIAGNOSIS — M25672 Stiffness of left ankle, not elsewhere classified: Secondary | ICD-10-CM | POA: Diagnosis not present

## 2024-06-25 DIAGNOSIS — M25572 Pain in left ankle and joints of left foot: Secondary | ICD-10-CM | POA: Diagnosis not present

## 2024-06-29 ENCOUNTER — Telehealth: Payer: Self-pay | Admitting: Family Medicine

## 2024-06-29 DIAGNOSIS — M810 Age-related osteoporosis without current pathological fracture: Secondary | ICD-10-CM | POA: Diagnosis not present

## 2024-06-29 DIAGNOSIS — G629 Polyneuropathy, unspecified: Secondary | ICD-10-CM | POA: Diagnosis not present

## 2024-06-29 DIAGNOSIS — N3281 Overactive bladder: Secondary | ICD-10-CM | POA: Diagnosis not present

## 2024-06-29 DIAGNOSIS — R569 Unspecified convulsions: Secondary | ICD-10-CM | POA: Diagnosis not present

## 2024-06-29 DIAGNOSIS — E1169 Type 2 diabetes mellitus with other specified complication: Secondary | ICD-10-CM | POA: Diagnosis not present

## 2024-06-29 DIAGNOSIS — G4733 Obstructive sleep apnea (adult) (pediatric): Secondary | ICD-10-CM

## 2024-06-29 DIAGNOSIS — G43009 Migraine without aura, not intractable, without status migrainosus: Secondary | ICD-10-CM | POA: Diagnosis not present

## 2024-06-29 DIAGNOSIS — E039 Hypothyroidism, unspecified: Secondary | ICD-10-CM | POA: Diagnosis not present

## 2024-06-29 DIAGNOSIS — Q828 Other specified congenital malformations of skin: Secondary | ICD-10-CM | POA: Diagnosis not present

## 2024-06-29 DIAGNOSIS — E782 Mixed hyperlipidemia: Secondary | ICD-10-CM | POA: Diagnosis not present

## 2024-06-29 DIAGNOSIS — I10 Essential (primary) hypertension: Secondary | ICD-10-CM | POA: Diagnosis not present

## 2024-06-29 NOTE — Telephone Encounter (Signed)
 Brook from [employer] called wanting to know if provider can send in a order for the pt to receive cpap supplies. Please advise.

## 2024-07-02 NOTE — Telephone Encounter (Addendum)
 Per last OV note from [name] on 05/15/24: She is not using CPAP. We have reviewed risk of untreated sleep apnea.   Called Brook back at [phone]. LVM for her to call office.

## 2024-07-02 NOTE — Telephone Encounter (Signed)
 Took call from [place] and spoke w/ [place].   They did home visit to pt on 05/28/24 and last week. Spoke w/ her about CPAP. She admitted to them it has been several years since she used CPAP/had sleep study but willing to restart. They noticed pt will need new supplies. Wondering if pt needs new sleep study? I reviewed chart and relayed she most likely will as it appears its been more than 3 yr since last one. [name] out today but aware I will send to her to review and will call back with her recommendation once back in office.

## 2024-07-03 ENCOUNTER — Telehealth: Payer: Self-pay | Admitting: *Deleted

## 2024-07-03 NOTE — Telephone Encounter (Signed)
 [name], looks like she was dx with sleep apnea in 2015 and original SS per notes ordered by a Dr. Helon. Can you try and get a copy of the sleep study report?

## 2024-07-03 NOTE — Addendum Note (Signed)
 Addended by: CARY NO L on: 07/03/2024 01:17 PM   Modules accepted: Orders

## 2024-07-03 NOTE — Telephone Encounter (Signed)
 I called and left message on patient voicemail. I need to know the name of the DME company to send cpap supply order for supplies.

## 2024-07-04 DIAGNOSIS — R2689 Other abnormalities of gait and mobility: Secondary | ICD-10-CM | POA: Diagnosis not present

## 2024-07-04 DIAGNOSIS — M6281 Muscle weakness (generalized): Secondary | ICD-10-CM | POA: Diagnosis not present

## 2024-07-04 DIAGNOSIS — M25672 Stiffness of left ankle, not elsewhere classified: Secondary | ICD-10-CM | POA: Diagnosis not present

## 2024-07-04 DIAGNOSIS — M25662 Stiffness of left knee, not elsewhere classified: Secondary | ICD-10-CM | POA: Diagnosis not present

## 2024-07-04 DIAGNOSIS — M25562 Pain in left knee: Secondary | ICD-10-CM | POA: Diagnosis not present

## 2024-07-04 DIAGNOSIS — M25572 Pain in left ankle and joints of left foot: Secondary | ICD-10-CM | POA: Diagnosis not present

## 2024-07-05 DIAGNOSIS — H52203 Unspecified astigmatism, bilateral: Secondary | ICD-10-CM | POA: Diagnosis not present

## 2024-07-05 DIAGNOSIS — E119 Type 2 diabetes mellitus without complications: Secondary | ICD-10-CM | POA: Diagnosis not present

## 2024-07-05 DIAGNOSIS — Z961 Presence of intraocular lens: Secondary | ICD-10-CM | POA: Diagnosis not present

## 2024-07-05 DIAGNOSIS — H1789 Other corneal scars and opacities: Secondary | ICD-10-CM | POA: Diagnosis not present

## 2024-07-13 DIAGNOSIS — E782 Mixed hyperlipidemia: Secondary | ICD-10-CM | POA: Diagnosis not present

## 2024-07-13 DIAGNOSIS — E1165 Type 2 diabetes mellitus with hyperglycemia: Secondary | ICD-10-CM | POA: Diagnosis not present

## 2024-07-13 DIAGNOSIS — I10 Essential (primary) hypertension: Secondary | ICD-10-CM | POA: Diagnosis not present

## 2024-07-17 ENCOUNTER — Other Ambulatory Visit (HOSPITAL_COMMUNITY): Payer: Self-pay | Admitting: Family Medicine

## 2024-07-17 DIAGNOSIS — E1169 Type 2 diabetes mellitus with other specified complication: Secondary | ICD-10-CM | POA: Diagnosis not present

## 2024-07-17 DIAGNOSIS — I10 Essential (primary) hypertension: Secondary | ICD-10-CM | POA: Diagnosis not present

## 2024-07-17 DIAGNOSIS — G629 Polyneuropathy, unspecified: Secondary | ICD-10-CM | POA: Diagnosis not present

## 2024-07-17 DIAGNOSIS — N3281 Overactive bladder: Secondary | ICD-10-CM | POA: Diagnosis not present

## 2024-07-17 DIAGNOSIS — R569 Unspecified convulsions: Secondary | ICD-10-CM | POA: Diagnosis not present

## 2024-07-17 DIAGNOSIS — M810 Age-related osteoporosis without current pathological fracture: Secondary | ICD-10-CM | POA: Diagnosis not present

## 2024-07-17 DIAGNOSIS — E782 Mixed hyperlipidemia: Secondary | ICD-10-CM | POA: Diagnosis not present

## 2024-07-17 DIAGNOSIS — M542 Cervicalgia: Secondary | ICD-10-CM

## 2024-07-17 DIAGNOSIS — E039 Hypothyroidism, unspecified: Secondary | ICD-10-CM | POA: Diagnosis not present

## 2024-07-17 DIAGNOSIS — G43009 Migraine without aura, not intractable, without status migrainosus: Secondary | ICD-10-CM | POA: Diagnosis not present

## 2024-07-17 DIAGNOSIS — Q828 Other specified congenital malformations of skin: Secondary | ICD-10-CM | POA: Diagnosis not present

## 2024-08-13 DIAGNOSIS — E1165 Type 2 diabetes mellitus with hyperglycemia: Secondary | ICD-10-CM | POA: Diagnosis not present

## 2024-08-13 DIAGNOSIS — I10 Essential (primary) hypertension: Secondary | ICD-10-CM | POA: Diagnosis not present

## 2024-08-13 DIAGNOSIS — E782 Mixed hyperlipidemia: Secondary | ICD-10-CM | POA: Diagnosis not present

## 2024-08-14 DIAGNOSIS — M810 Age-related osteoporosis without current pathological fracture: Secondary | ICD-10-CM | POA: Diagnosis not present

## 2024-08-14 DIAGNOSIS — G43009 Migraine without aura, not intractable, without status migrainosus: Secondary | ICD-10-CM | POA: Diagnosis not present

## 2024-08-14 DIAGNOSIS — R569 Unspecified convulsions: Secondary | ICD-10-CM | POA: Diagnosis not present

## 2024-08-14 DIAGNOSIS — E1169 Type 2 diabetes mellitus with other specified complication: Secondary | ICD-10-CM | POA: Diagnosis not present

## 2024-08-14 DIAGNOSIS — E782 Mixed hyperlipidemia: Secondary | ICD-10-CM | POA: Diagnosis not present

## 2024-08-14 DIAGNOSIS — N3281 Overactive bladder: Secondary | ICD-10-CM | POA: Diagnosis not present

## 2024-08-14 DIAGNOSIS — Q828 Other specified congenital malformations of skin: Secondary | ICD-10-CM | POA: Diagnosis not present

## 2024-08-14 DIAGNOSIS — E039 Hypothyroidism, unspecified: Secondary | ICD-10-CM | POA: Diagnosis not present

## 2024-08-14 DIAGNOSIS — G629 Polyneuropathy, unspecified: Secondary | ICD-10-CM | POA: Diagnosis not present

## 2024-08-14 DIAGNOSIS — I10 Essential (primary) hypertension: Secondary | ICD-10-CM | POA: Diagnosis not present

## 2024-08-21 DIAGNOSIS — J029 Acute pharyngitis, unspecified: Secondary | ICD-10-CM | POA: Diagnosis not present

## 2024-08-29 ENCOUNTER — Other Ambulatory Visit: Payer: Self-pay | Admitting: Adult Health

## 2024-09-02 ENCOUNTER — Emergency Department (HOSPITAL_COMMUNITY)

## 2024-09-02 ENCOUNTER — Other Ambulatory Visit: Payer: Self-pay

## 2024-09-02 ENCOUNTER — Emergency Department (HOSPITAL_COMMUNITY)
Admission: EM | Admit: 2024-09-02 | Discharge: 2024-09-02 | Disposition: A | Discharge status: Home / Self Care | Attending: Emergency Medicine | Admitting: Emergency Medicine

## 2024-09-02 ENCOUNTER — Encounter (HOSPITAL_COMMUNITY): Payer: Self-pay

## 2024-09-02 DIAGNOSIS — R42 Dizziness and giddiness: Secondary | ICD-10-CM | POA: Diagnosis not present

## 2024-09-02 DIAGNOSIS — Z91040 Latex allergy status: Secondary | ICD-10-CM | POA: Diagnosis not present

## 2024-09-02 DIAGNOSIS — R519 Headache, unspecified: Secondary | ICD-10-CM | POA: Insufficient documentation

## 2024-09-02 LAB — CBC
HCT: 43.1 % (ref 36.0–46.0)
Hemoglobin: 13.6 g/dL (ref 12.0–15.0)
MCH: 28.9 pg (ref 26.0–34.0)
MCHC: 31.6 g/dL (ref 30.0–36.0)
MCV: 91.7 fL (ref 80.0–100.0)
Platelets: 252 K/uL (ref 150–400)
RBC: 4.7 MIL/uL (ref 3.87–5.11)
RDW: 12.7 % (ref 11.5–15.5)
WBC: 7.1 K/uL (ref 4.0–10.5)
nRBC: 0 % (ref 0.0–0.2)

## 2024-09-02 LAB — COMPREHENSIVE METABOLIC PANEL WITH GFR
ALT: 24 U/L (ref 0–44)
AST: 31 U/L (ref 15–41)
Albumin: 3.8 g/dL (ref 3.5–5.0)
Alkaline Phosphatase: 75 U/L (ref 38–126)
Anion gap: 13 (ref 5–15)
BUN: 18 mg/dL (ref 8–23)
CO2: 26 mmol/L (ref 22–32)
Calcium: 9.2 mg/dL (ref 8.9–10.3)
Chloride: 102 mmol/L (ref 98–111)
Creatinine, Ser: 0.94 mg/dL (ref 0.44–1.00)
GFR, Estimated: >60 mL/min (ref >60)
Glucose, Bld: 109 mg/dL — ABNORMAL HIGH (ref 70–99)
Potassium: 4.4 mmol/L (ref 3.5–5.1)
Sodium: 141 mmol/L (ref 135–145)
Total Bilirubin: 0.8 mg/dL (ref 0.0–1.2)
Total Protein: 7 g/dL (ref 6.5–8.1)

## 2024-09-02 LAB — URINALYSIS, ROUTINE W REFLEX MICROSCOPIC
Bilirubin Urine: NEGATIVE
Glucose, UA: NEGATIVE mg/dL
Hgb urine dipstick: NEGATIVE
Ketones, ur: NEGATIVE mg/dL
Nitrite: NEGATIVE
Protein, ur: NEGATIVE mg/dL
Specific Gravity, Urine: 1.016 (ref 1.005–1.030)
pH: 5 (ref 5.0–8.0)

## 2024-09-02 MED ORDER — DIPHENHYDRAMINE HCL 25 MG PO CAPS
25.0000 mg | ORAL_CAPSULE | Freq: Once | ORAL | Status: AC
  Administered 2024-09-02: 25 mg via ORAL
  Filled 2024-09-02: qty 1

## 2024-09-02 MED ORDER — METOCLOPRAMIDE HCL 5 MG/ML IJ SOLN
10.0000 mg | Freq: Once | INTRAMUSCULAR | Status: AC
  Administered 2024-09-02: 10 mg via INTRAMUSCULAR
  Filled 2024-09-02: qty 2

## 2024-09-02 MED ORDER — MECLIZINE HCL 25 MG PO TABS: 25.0000 mg | ORAL_TABLET | Freq: Three times a day (TID) | ORAL | 0 refills | Status: AC | PRN

## 2024-09-02 MED ORDER — DEXAMETHASONE SODIUM PHOSPHATE 10 MG/ML IJ SOLN
10.0000 mg | Freq: Once | INTRAMUSCULAR | Status: AC
  Administered 2024-09-02: 10 mg via INTRAMUSCULAR
  Filled 2024-09-02: qty 1

## 2024-09-02 NOTE — ED Provider Notes (Signed)
  EMERGENCY DEPARTMENT AT [facility] Provider Note   CSN: [id number] Arrival date & time: 09/02/24  1206     Patient presents with: Dizziness   [name] is a 68 y.o. female.   Patient to ED for evaluation of frontal, bilateral headache that started over the last 1-2 days. She feels dizziness described as room is spinning. No nausea or vomiting. No fever. No falls, visual change, speech difficulty. No photosensitivity. History of migraines but not the same headache. She tried taking Tylenol  and sleeping it off but pain is persistent.   The history is provided by the patient. No language interpreter was used.  Dizziness      Prior to Admission medications   Medication Sig Start Date End Date Taking? Authorizing Provider  meclizine  (ANTIVERT ) 25 MG tablet Take 1 tablet (25 mg total) by mouth 3 (three) times daily as needed for dizziness. 09/02/24  Yes Abdel Effinger, Margit, PA-C  Ascorbic Acid (VITAMIN C GUMMIES PO) Take 1 tablet by mouth daily.    [provider]  atorvastatin  (LIPITOR) 40 MG tablet Take 40 mg by mouth at bedtime.  10/15/14   [provider]  Calcium  Carbonate (CALCIUM -CARB 600 PO) Take 1 tablet by mouth daily.    [provider]  cefadroxil  (DURICEF) 500 MG capsule Take 1 capsule (500 mg total) by mouth 2 (two) times daily. 07/28/23   Silver Wonda LABOR, PA  clobetasol (TEMOVATE) 0.05 % external solution Apply 1 application topically 2 (two) times daily as needed (to scalp for use at 1-2 weeks at one time).  04/04/18   [provider]  ferrous sulfate 325 (65 FE) MG EC tablet Take 325 mg by mouth daily.    [provider]  ibuprofen  (ADVIL ,MOTRIN ) 200 MG tablet Take 200-400 mg by mouth daily as needed for mild pain or moderate pain.     [provider]  levETIRAcetam  (KEPPRA ) 500 MG tablet TAKE ONE TABLET BY MOUTH AT BREAKFAST AND AT BEDTIME 05/23/24   Lomax, [name], NP  levothyroxine  (SYNTHROID ,  LEVOTHROID) 100 MCG tablet Take 100 mcg by mouth every morning.  12/31/16   [provider]  metFORMIN  (GLUCOPHAGE -XR) 500 MG 24 hr tablet Take 500 mg by mouth 2 (two) times daily. 04/29/21   [provider]  metoprolol  (LOPRESSOR ) 50 MG tablet Take 50 mg by mouth 2 (two) times daily.    [provider]  MYRBETRIQ  25 MG TB24 tablet Take 1 tablet (25 mg total) by mouth daily. 08/29/24   Signa Delon LABOR, NP  nystatin  cream (MYCOSTATIN ) Apply 1 application topically 2 (two) times daily as needed for dry skin. 10/07/19   Jayne Vonn DEL, MD  oxyCODONE -acetaminophen  (PERCOCET/ROXICET) 5-325 MG tablet Take 1 pill every 6 hours for severe headaches that are not helped by Tylenol  alone 09/01/23   Zammit, Joseph, MD  POTASSIUM CHLORIDE PO Take 1 tablet by mouth in the morning and at bedtime.    [provider]  vitamin B-12 (CYANOCOBALAMIN) 100 MCG tablet Take 100 mcg by mouth daily.    [provider]  VITAMIN D PO Take 1 tablet by mouth daily.    [provider]    Allergies: Aspirin , Codeine, and Latex    Review of Systems  Neurological:  Positive for dizziness.    Updated Vital Signs BP (!) 146/75 (BP Location: Right Arm)   Pulse 70   Temp 98.3 F (36.8 C) (Oral)   Resp 16   Ht 5' 4 (1.626  m)   Wt 104.3 kg   SpO2 94%   BMI 39.48 kg/m   Physical Exam Vitals and nursing note reviewed.  Constitutional:      General: She is not in acute distress.    Appearance: Normal appearance. She is obese. She is not ill-appearing.  HENT:     Head: Normocephalic and atraumatic.  Eyes:     Extraocular Movements: Extraocular movements intact.     Pupils: Pupils are equal, round, and reactive to light.  Cardiovascular:     Rate and Rhythm: Normal rate.  Pulmonary:     Effort: Pulmonary effort is normal.  Abdominal:     Palpations: Abdomen is soft.     Tenderness: There is no abdominal tenderness.  Musculoskeletal:        General: Normal  range of motion.     Cervical back: Normal range of motion and neck supple.  Skin:    General: Skin is warm and dry.  Neurological:     Mental Status: She is alert and oriented to person, place, and time.     GCS: GCS eye subscore is 4. GCS verbal subscore is 5. GCS motor subscore is 6.     Cranial Nerves: Cranial nerves 2-12 are intact. No dysarthria or facial asymmetry.     Sensory: Sensation is intact.     Motor: Motor function is intact. No weakness, tremor or pronator drift.     Coordination: Finger-Nose-Finger Test normal.     (all labs ordered are listed, but only abnormal results are displayed) Labs Reviewed  COMPREHENSIVE METABOLIC PANEL WITH GFR - Abnormal; Notable for the following components:      Result Value   Glucose, Bld 109 (*)    All other components within normal limits  URINALYSIS, ROUTINE W REFLEX MICROSCOPIC - Abnormal; Notable for the following components:   APPearance HAZY (*)    Leukocytes,Ua SMALL (*)    Bacteria, UA RARE (*)    All other components within normal limits  CBC    EKG: None  Radiology: CT HEAD WO CONTRAST Result Date: 09/02/2024 CLINICAL DATA:  Severe headache beginning this morning.  Vertigo. EXAM: CT HEAD WITHOUT CONTRAST TECHNIQUE: Contiguous axial images were obtained from the base of the skull through the vertex without intravenous contrast. RADIATION DOSE REDUCTION: This exam was performed according to the departmental dose-optimization program which includes automated exposure control, adjustment of the mA and/or kV according to patient size and/or use of iterative reconstruction technique. COMPARISON:  05/18/2024 FINDINGS: Brain: No evidence of intracranial hemorrhage, acute infarction, hydrocephalus, extra-axial collection, or mass lesion/mass effect. Mild to moderate diffuse cerebral atrophy again noted. Vascular:  No hyperdense vessel or other acute findings. Skull: No evidence of fracture or other significant bone abnormality.  Sinuses/Orbits:  No acute findings. Other: None. IMPRESSION: No acute intracranial abnormality. Stable cerebral atrophy. Electronically Signed   By: Norleen DELENA Kil M.D.   On: 09/02/2024 14:23     Procedures   Medications Ordered in the ED  dexamethasone  (DECADRON ) injection 10 mg (10 mg Intramuscular Given 09/02/24 1429)  metoCLOPramide  (REGLAN ) injection 10 mg (10 mg Intramuscular Given 09/02/24 1430)  diphenhydrAMINE  (BENADRYL ) capsule 25 mg (25 mg Oral Given 09/02/24 1428)    Clinical Course as of 09/02/24 1529  Sun Sep 02, 2024  1522 Patient to ED for evaluation of frontal headache x 2 days, without fever, nausea, vomiting. History of headache. Normal neurologic exam. Labs are unremarkable, no evidence of infection.   Medications for headache provided (  Decadron , Reglan , Benadryl ). On recheck, headache is resolved. She is ambulated and is steady and feels at baseline. She has been seen by Dr. Dean and is felt appropriate for discharge home with husband.  [SU]    Clinical Course User Index [SU] Odell Balls, PA-C                                 Medical Decision Making Amount and/or Complexity of Data Reviewed Labs: ordered. Radiology: ordered.  Risk Prescription drug management.        Final diagnoses:  Frontal headache  Dizziness    ED Discharge Orders          Ordered    meclizine  (ANTIVERT ) 25 MG tablet  3 times daily PRN        09/02/24 1529               Odell Balls, PA-C 09/02/24 1529    Dean Clarity, MD 09/02/24 1540

## 2024-09-02 NOTE — ED Triage Notes (Signed)
 Pt stated that she has a severe headache, is dizzy and feels like she is trembling all over. Started this am. Pt stated that she tried to sleep it off with no success.

## 2024-09-02 NOTE — Discharge Instructions (Signed)
 As we discussed, your exam, labs and head CT are normal today. As your headache is better with medications, you can go home but are encouraged to see your doctor in 2 days for recheck. Take meclizine  for the dizziness and discuss whether this is helping your symptoms of dizziness with your doctor.

## 2024-09-11 DIAGNOSIS — G43009 Migraine without aura, not intractable, without status migrainosus: Secondary | ICD-10-CM | POA: Diagnosis not present

## 2024-09-11 DIAGNOSIS — M810 Age-related osteoporosis without current pathological fracture: Secondary | ICD-10-CM | POA: Diagnosis not present

## 2024-09-11 DIAGNOSIS — Q828 Other specified congenital malformations of skin: Secondary | ICD-10-CM | POA: Diagnosis not present

## 2024-09-11 DIAGNOSIS — R569 Unspecified convulsions: Secondary | ICD-10-CM | POA: Diagnosis not present

## 2024-09-11 DIAGNOSIS — E039 Hypothyroidism, unspecified: Secondary | ICD-10-CM | POA: Diagnosis not present

## 2024-09-11 DIAGNOSIS — E782 Mixed hyperlipidemia: Secondary | ICD-10-CM | POA: Diagnosis not present

## 2024-09-11 DIAGNOSIS — N3281 Overactive bladder: Secondary | ICD-10-CM | POA: Diagnosis not present

## 2024-09-11 DIAGNOSIS — E1169 Type 2 diabetes mellitus with other specified complication: Secondary | ICD-10-CM | POA: Diagnosis not present

## 2024-09-11 DIAGNOSIS — G629 Polyneuropathy, unspecified: Secondary | ICD-10-CM | POA: Diagnosis not present

## 2024-09-11 DIAGNOSIS — I10 Essential (primary) hypertension: Secondary | ICD-10-CM | POA: Diagnosis not present

## 2024-09-13 ENCOUNTER — Telehealth: Payer: Self-pay | Admitting: Family Medicine

## 2024-09-13 MED ORDER — RIZATRIPTAN BENZOATE 10 MG PO TBDP
10.0000 mg | ORAL_TABLET | ORAL | 11 refills | Status: DC | PRN
Start: 2024-09-13 — End: 2024-10-18

## 2024-09-13 NOTE — Telephone Encounter (Signed)
 [name]@ [employer] has called to report pt went to ED as a result of migraines,she would like a call to discuss a recommendation from Dr Vear re: what pt can take for the onset of migraines.

## 2024-09-13 NOTE — Telephone Encounter (Signed)
 Please make sure she has restarted her CPAP. We can try rizatriptan as needed. She reported only haivng a few headaches a month so as long as that has not changed, she can take rizatriptan 10mg  at onset of migraine and repeat 10mg  in 2 hours, if needed. Do not exceed 2 doses in 24 hours. If headaches are more frequent, we need to schedule her for visit to discuss prevention options as she was hesitant to restart meds when I last talked to her.

## 2024-09-13 NOTE — Telephone Encounter (Signed)
 Debra, unclear if we got SS results below. Can you look unto this again and see if you can get copy?     Called [name] back at [phone]. She states pt reports she was having issues with current CPAP machine/broken and trying to get a new machine. Aware we tried reaching her back in July but did not hear back.   Agreed it would be best for pt to schedule appt to discuss treatment options/getting new CPAP machine. Scheduled appt for 09/19/24 at 1:00pm with AL,NP. She will have pt call back if this does not work to r/s.

## 2024-09-13 NOTE — Telephone Encounter (Signed)
 [name], can you review? What do you recommend?  Per [name]'s last OV note 05/15/24:  We will not restart Emgality  and zonisamide  since migraines are well managed.   She went to [facility] 09/02/24. After visit summary below. She is scheduled for next follow up 05/20/25 with AL,NP currently.

## 2024-09-17 ENCOUNTER — Telehealth: Payer: Self-pay | Admitting: Neurology

## 2024-09-17 NOTE — Telephone Encounter (Signed)
 MYC conf

## 2024-09-19 ENCOUNTER — Ambulatory Visit (INDEPENDENT_AMBULATORY_CARE_PROVIDER_SITE_OTHER): Admitting: Neurology

## 2024-09-19 ENCOUNTER — Encounter: Payer: Self-pay | Admitting: Neurology

## 2024-09-19 VITALS — BP 115/79 | HR 74 | Ht 64.0 in | Wt 269.5 lb

## 2024-09-19 DIAGNOSIS — G44229 Chronic tension-type headache, not intractable: Secondary | ICD-10-CM | POA: Diagnosis not present

## 2024-09-19 DIAGNOSIS — G43709 Chronic migraine without aura, not intractable, without status migrainosus: Secondary | ICD-10-CM

## 2024-09-19 DIAGNOSIS — E1142 Type 2 diabetes mellitus with diabetic polyneuropathy: Secondary | ICD-10-CM | POA: Diagnosis not present

## 2024-09-19 DIAGNOSIS — G40309 Generalized idiopathic epilepsy and epileptic syndromes, not intractable, without status epilepticus: Secondary | ICD-10-CM

## 2024-09-19 DIAGNOSIS — G4733 Obstructive sleep apnea (adult) (pediatric): Secondary | ICD-10-CM

## 2024-09-19 DIAGNOSIS — Z7984 Long term (current) use of oral hypoglycemic drugs: Secondary | ICD-10-CM | POA: Diagnosis not present

## 2024-09-19 DIAGNOSIS — M542 Cervicalgia: Secondary | ICD-10-CM | POA: Diagnosis not present

## 2024-09-19 MED ORDER — AJOVY 225 MG/1.5ML ~~LOC~~ SOSY: 1.0000 | PREFILLED_SYRINGE | SUBCUTANEOUS | 4 refills | Status: DC

## 2024-09-19 MED ORDER — BETAMETHASONE SOD PHOS & ACET 6 (3-3) MG/ML IJ SUSP
12.0000 mg | Freq: Once | INTRAMUSCULAR | Status: AC
  Administered 2024-09-19: 12 mg via INTRAMUSCULAR

## 2024-09-19 NOTE — Progress Notes (Signed)
 [facility]  PATIENT: [name] DOB: July 31, 1956  REFERRING DOCTOR OR PCP:  Arthea Hurst SOURCE: patient and records form EMR and Dr. Hurst  _________________________________   HISTORICAL  CHIEF COMPLAINT:  Chief Complaint  Patient presents with   Follow-up    Pt in room 10. Here for seizure/ cpap follow up. Pt reports her cpap machine broke and she never contacted the DME. Reports no recent sleep study.     HISTORY OF PRESENT ILLNESS:  [name] is a 68 year old woman with a h/o a generalized tonic-clonic seizure who also has neck pain,  headaches and obstructive sleep apnea.  Update 09/19/2024: She has headache about 28/30 days a month and many have migrainous features.  In the past, she was on Ajovy  and it helped reduce the intensity and frequency of migraine.   Due to pharmacy issues (unsure details) she has not had recently.     She last took 2-3 months ago.    She has headache , mostly on the top of the head and the occiput.   Holding head up and bright lights worsen the pain.   Sleep helps.   Donzell rooms and staying still helps.    NSAIDs don't help much and pain returns.   She has had the headache x 1 week.    In the past, ONB/TPI only helped that day.  She takes zonisamide  300 mg nightly and Keppra  500 mg po bid for HA and seizure.   In 2018, MRI cervical showed DJD at C6-C7 without spinal stenosis but there was no nerve root compression.     She has OSA and was using CPAP nightly.  Her machine broke last year.  She felt she slept better in general when she used it.  HA was also milder.     She has not had any definite seizure since 2016 but had a spell with altered awareness and went to the hospital. She had incontinence but no GTC. She was arguing with family t the time. .    She thinks she tolerates the medications well.     EEG 05/22/2024 showed left central spikes.  No actual seizures.   She has a few falls due to poor balance but no recent  fall (none in 2025)    She feels bladder function has not changed - She notes improved depression.  She has a lot of family stress (takes care of 2 great grand-kids and a foster child and granddaughter lives with her).   She has diabetes and notes some numbness in her toes.  This is less troublesome than her other issues.  EPWORTH SLEEPINESS SCALE today  On a scale of 0 - 3 what is the chance of dozing:  Sitting and Reading:   3 Watching TV:    2 Sitting inactive in a public place: 3 Passenger in car for one hour: 3 Lying down to rest in the afternoon: 2 Sitting and talking to someone: 2 Sitting quietly after lunch:  3 In a car, stopped in traffic:  1  Total (out of 24):  19/24   severe EDS  Seizure history In August 2015, she had generalized tonic-clonic seizure activity that was witnessed by her husband. She had no prior seizures and  An EEG was performed and showed 2 runs of generalized spike wave activity, consistent with a generalized seizure disorder. She is unaware of any family history of seizures.    She has not had head trauma in the  past. She had a normal childhood development. She has not had any meningitis or encephalitis.  Pseudotumor cerebri/headache and neck pain:   Due to headaches and papilledema, she underwent a LP.    CSF pressure was elevated At 26.5 cm.. She reports that her ophthalmologist told her that the optic nerve changes were due to cataracts.  Obstructive sleep apnea: She was diagnosed with OSA around 2015 with an overnight sleep study. She was started on CPAP +6 cm in the past.    However, she had trouble tolerating the mask and stopped.   She is claustophobic so seldom made it through the night with CPAP.    REVIEW OF SYSTEMS: Constitutional: No fevers, chills, sweats, or change in appetite.   She notes some fatigue and sleepiness. Eyes: cataracts.    Improved blurriness Ear, nose and throat: No hearing loss, ear pain, nasal congestion, sore  throat Cardiovascular: No chest pain, palpitations Respiratory:  No shortness of breath at rest or with exertion.   Some cough and wheezes GastrointestinaI: No nausea, vomiting, diarrhea, abdominal pain, fecal incontinence Genitourinary:  No dysuria, urinary retention or frequency.  No nocturia. Musculoskeletal:  No neck pain, back pain Integumentary: No rash, pruritus, skin lesions Neurological: as above Psychiatric: No depression at this time.  No anxiety.  Notes increased stress at times. Endocrine: No palpitations, diaphoresis, change in appetite, change in weigh or increased thirst Hematologic/Lymphatic:  No anemia, purpura, petechiae. Allergic/Immunologic: No itchy/runny eyes, nasal congestion, recent allergic reactions, rashes  ALLERGIES: Allergies  Allergen Reactions   Aspirin  Nausea And Vomiting   Codeine Rash   Latex Rash    HOME MEDICATIONS:  Current Outpatient Medications:    Ascorbic Acid (VITAMIN C GUMMIES PO), Take 1 tablet by mouth daily., Disp: , Rfl:    atorvastatin  (LIPITOR) 40 MG tablet, Take 40 mg by mouth at bedtime. , Disp: , Rfl:    Calcium  Carbonate (CALCIUM -CARB 600 PO), Take 1 tablet by mouth daily., Disp: , Rfl:    cefadroxil  (DURICEF) 500 MG capsule, Take 1 capsule (500 mg total) by mouth 2 (two) times daily., Disp: 12 capsule, Rfl: 0   clobetasol (TEMOVATE) 0.05 % external solution, Apply 1 application topically 2 (two) times daily as needed (to scalp for use at 1-2 weeks at one time). , Disp: , Rfl: 2   ferrous sulfate 325 (65 FE) MG EC tablet, Take 325 mg by mouth daily., Disp: , Rfl:    ibuprofen  (ADVIL ,MOTRIN ) 200 MG tablet, Take 200-400 mg by mouth daily as needed for mild pain or moderate pain. , Disp: , Rfl:    levETIRAcetam  (KEPPRA ) 500 MG tablet, TAKE ONE TABLET BY MOUTH AT BREAKFAST AND AT BEDTIME, Disp: 180 tablet, Rfl: 3   levothyroxine  (SYNTHROID , LEVOTHROID) 100 MCG tablet, Take 100 mcg by mouth every morning. , Disp: , Rfl:    meclizine   (ANTIVERT ) 25 MG tablet, Take 1 tablet (25 mg total) by mouth 3 (three) times daily as needed for dizziness., Disp: 30 tablet, Rfl: 0   metFORMIN  (GLUCOPHAGE -XR) 500 MG 24 hr tablet, Take 500 mg by mouth 2 (two) times daily., Disp: , Rfl:    metoprolol  (LOPRESSOR ) 50 MG tablet, Take 50 mg by mouth 2 (two) times daily., Disp: , Rfl:    MYRBETRIQ  25 MG TB24 tablet, Take 1 tablet (25 mg total) by mouth daily., Disp: 30 tablet, Rfl: 3   nystatin  cream (MYCOSTATIN ), Apply 1 application topically 2 (two) times daily as needed for dry skin., Disp: 30 g, Rfl: 11  oxyCODONE -acetaminophen  (PERCOCET/ROXICET) 5-325 MG tablet, Take 1 pill every 6 hours for severe headaches that are not helped by Tylenol  alone, Disp: 10 tablet, Rfl: 0   POTASSIUM CHLORIDE PO, Take 1 tablet by mouth in the morning and at bedtime., Disp: , Rfl:    rizatriptan (MAXALT-MLT) 10 MG disintegrating tablet, Take 1 tablet (10 mg total) by mouth as needed for migraine. May repeat in 2 hours if needed, Disp: 9 tablet, Rfl: 11   vitamin B-12 (CYANOCOBALAMIN) 100 MCG tablet, Take 100 mcg by mouth daily., Disp: , Rfl:    VITAMIN D PO, Take 1 tablet by mouth daily., Disp: , Rfl:    Fremanezumab -vfrm (AJOVY ) 225 MG/1.5ML SOSY, Inject 1 Syringe into the skin every 28 (twenty-eight) days., Disp: 4.5 mL, Rfl: 4  PAST MEDICAL HISTORY: Past Medical History:  Diagnosis Date   Asthma    Chronic back pain    Chronic leg pain    Bilateral   Chronic pelvic pain in female    Essential hypertension    Fibromyalgia    H/O cardiac catheterization    No significant CAD (only 20% LAD) 03/02/13 with normal LV function.   Headache    High cholesterol    Hypothyroidism    OAB (overactive bladder)    Obstructive sleep apnea    Noncompliant with CPAP due to claustophobia.   Panic attacks    Rectocele    Seizures (HCC)    1 severe seizure 2015; has had small ones since including 9/18   Type 2 diabetes mellitus (HCC)     PAST SURGICAL  HISTORY: Past Surgical History:  Procedure Laterality Date   ABDOMINAL HYSTERECTOMY     ANKLE SURGERY     BACK SURGERY     CARPAL TUNNEL RELEASE     knee replacements     bilateral   LEFT HEART CATHETERIZATION WITH CORONARY ANGIOGRAM N/A 03/02/2013   Procedure: LEFT HEART CATHETERIZATION WITH CORONARY ANGIOGRAM;  Surgeon: Peter M Swaziland, MD;  Location: [facility] CATH LAB;  Service: Cardiovascular;  Laterality: N/A;   TONSILLECTOMY     TUBAL LIGATION      FAMILY HISTORY: Family History  Problem Relation Age of Onset   Heart attack Mother    Diabetes Mother    Hypertension Mother    Sick sinus syndrome Brother        Pacemaker   Congenital heart disease Brother    Stroke Brother    Coronary artery disease Brother    Breast cancer Maternal Aunt    Cancer Cousin        leukemia    SOCIAL HISTORY:  Social History   Socioeconomic History   Marital status: Married    Spouse name: Not on file   Number of children: Not on file   Years of education: Not on file   Highest education level: Not on file  Occupational History   Occupation: disabled  Tobacco Use   Smoking status: Former    Current packs/day: 0.00    Average packs/day: 1 pack/day for 2.0 years (2.0 ttl pk-yrs)    Types: Cigarettes    Start date: 12/13/1972    Quit date: 12/13/1974    Years since quitting: 49.8   Smokeless tobacco: Never  Vaping Use   Vaping status: Never Used  Substance and Sexual Activity   Alcohol use: No   Drug use: No   Sexual activity: Not Currently    Birth control/protection: Surgical    Comment: hyst  Other Topics Concern  Not on file  Social History Narrative   Not on file   Social Drivers of Health   Financial Resource Strain: Not on file  Food Insecurity: Not on file  Transportation Needs: Not on file  Physical Activity: Not on file  Stress: Not on file  Social Connections: Unknown (04/27/2022)   Received [facility]   Social Network    Social Network: Not on file   Intimate Partner Violence: Unknown (03/19/2022)   Received from [facility]   HITS    Physically Hurt: Not on file    Insult or Talk Down To: Not on file    Threaten Physical Harm: Not on file    Scream or Curse: Not on file     PHYSICAL EXAM  Vitals:   09/19/24 1243  BP: 115/79  Pulse: 74  SpO2: 100%  Weight: 269 lb 8 oz (122.2 kg)  Height: 5' 4 (1.626 m)    Body mass index is 46.26 kg/m.   General: The patient is an obese woman in no acute distress   Neck/Ext: The neck is supple .  She is tender over the occiput bilaterally (splenius capitis muscles / GON's)   Neurologic Exam  Mental status: The patient is alert and oriented x 3 at the time of the examination. The patient has apparent normal recent and remote memory, with an apparently normal attention span and concentration ability.   Speech is normal.  Cranial nerves: Extraocular movements are full.  Facial strength and sensation is normal. Trapezius strength is normal.    No obvious hearing deficits are noted.  Motor:  Muscle bulk is normal.   Tone is normal. Strength is  5 / 5 in all 4 extremities.   Sensory: Sensory testing is intact to touch and vibration in the hands.  She has reduced vibration sensation at ankles and toes  Coordination: Cerebellar testing reveals good finger-nose-finger and heel-to-shin bilaterally.  Gait and station: Station is normal.  Gait and tandem gait are mildly wide.  Romberg was negative..  Reflexes: Deep tendon reflexes are symmetric and normal bilaterally in arms and knees and absent ankle DTR.       DIAGNOSTIC DATA (LABS, IMAGING, TESTING) - I reviewed patient records, labs, notes, testing and imaging myself where available.  Lab Results  Component Value Date   WBC 7.1 09/02/2024   HGB 13.6 09/02/2024   HCT 43.1 09/02/2024   MCV 91.7 09/02/2024   PLT 252 09/02/2024      Component Value Date/Time   NA 141 09/02/2024 1237   K 4.4 09/02/2024 1237   CL 102 09/02/2024  1237   CO2 26 09/02/2024 1237   GLUCOSE 109 (H) 09/02/2024 1237   BUN 18 09/02/2024 1237   CREATININE 0.94 09/02/2024 1237   CALCIUM  9.2 09/02/2024 1237   PROT 7.0 09/02/2024 1237   PROT 6.7 05/11/2022 1126   ALBUMIN 3.8 09/02/2024 1237   AST 31 09/02/2024 1237   ALT 24 09/02/2024 1237   ALKPHOS 75 09/02/2024 1237   BILITOT 0.8 09/02/2024 1237   GFRNONAA >60 09/02/2024 1237   GFRAA >60 09/06/2019 0027   Lab Results  Component Value Date   CHOL 119 09/05/2017   HDL 40 (L) 09/05/2017   LDLCALC 65 09/05/2017   TRIG 69 09/05/2017   CHOLHDL 3.0 09/05/2017   Lab Results  Component Value Date   HGBA1C 5.3 09/04/2017      ASSESSMENT AND PLAN   1. Epilepsy, generalized, convulsive (HCC)   2.  Chronic migraine w/o aura, not intractable, w/o stat migr   3. OSA (obstructive sleep apnea)   4. Diabetic polyneuropathy associated with type 2 diabetes mellitus (HCC)   5. Chronic tension-type headache, not intractable       1.   Continue Keppra  500 mg p.o. twice daily and zonisamide  300 mg nightly for seizures and headaches 2.   Continue Cymbalta  60 mg twice daily for mood and pain.       3    HST for OSA and order Auto-PAP based on results 4.   Current has mixed CTTH/migraine HA characteristics.   Bilateral splenius capitus injection with 12 mg Betamethasone in Marcaine using sterile technique.  She tolerated I well and no complications.   5.   Ajovy  (sampleTBYB12A  Feb 2028) for chronic migraine.    5.   She will return to see me in 6 months or sooner if there are new or worsening neurologic symptoms.   Shavanna Furnari A. Vear, MD, PhD 09/19/2024, 1:28 PM Certified in Neurology, Clinical Neurophysiology, Sleep Medicine, Pain Medicine and Neuroimaging  [facility] [address] [place], [place] [address] [phone]

## 2024-10-03 ENCOUNTER — Ambulatory Visit: Admitting: Neurology

## 2024-10-03 DIAGNOSIS — G4733 Obstructive sleep apnea (adult) (pediatric): Secondary | ICD-10-CM

## 2024-10-03 NOTE — Progress Notes (Signed)
 [name]

## 2024-10-05 DIAGNOSIS — E039 Hypothyroidism, unspecified: Secondary | ICD-10-CM | POA: Diagnosis not present

## 2024-10-05 DIAGNOSIS — Q828 Other specified congenital malformations of skin: Secondary | ICD-10-CM | POA: Diagnosis not present

## 2024-10-05 DIAGNOSIS — R569 Unspecified convulsions: Secondary | ICD-10-CM | POA: Diagnosis not present

## 2024-10-05 DIAGNOSIS — E782 Mixed hyperlipidemia: Secondary | ICD-10-CM | POA: Diagnosis not present

## 2024-10-05 DIAGNOSIS — E1169 Type 2 diabetes mellitus with other specified complication: Secondary | ICD-10-CM | POA: Diagnosis not present

## 2024-10-05 DIAGNOSIS — N3281 Overactive bladder: Secondary | ICD-10-CM | POA: Diagnosis not present

## 2024-10-05 DIAGNOSIS — G43009 Migraine without aura, not intractable, without status migrainosus: Secondary | ICD-10-CM | POA: Diagnosis not present

## 2024-10-05 DIAGNOSIS — G629 Polyneuropathy, unspecified: Secondary | ICD-10-CM | POA: Diagnosis not present

## 2024-10-05 DIAGNOSIS — I10 Essential (primary) hypertension: Secondary | ICD-10-CM | POA: Diagnosis not present

## 2024-10-05 DIAGNOSIS — M810 Age-related osteoporosis without current pathological fracture: Secondary | ICD-10-CM | POA: Diagnosis not present

## 2024-10-17 ENCOUNTER — Telehealth: Payer: Self-pay | Admitting: Neurology

## 2024-10-17 NOTE — Telephone Encounter (Signed)
 Brook from [employer] called wanting to know if a nurse can call her back to discuss about getting the pt back on Maxalt. Please advise.

## 2024-10-18 MED ORDER — RIZATRIPTAN BENZOATE 10 MG PO TBDP: ORAL_TABLET | ORAL | 11 refills | Status: AC

## 2024-10-24 ENCOUNTER — Telehealth: Payer: Self-pay | Admitting: Neurology

## 2024-10-24 NOTE — Telephone Encounter (Signed)
 [name] from  [facility] called to request for  to be prescribeFremanezumab-vfrm (AJOVY ) 225 MG/1.5ML SOSY  on regular basis . Pt was given a sample dose and would like to continue taking medication  Fremanezumab -vfrm (AJOVY ) 225 MG/1.5ML SOSY   Callback # [phone]

## 2024-10-24 NOTE — Telephone Encounter (Signed)
 Phone room: please call Lyle back and let them know Dr. Vear already sent in a prescription for Ajovy  back on 09/19/24

## 2024-11-07 ENCOUNTER — Encounter (INDEPENDENT_AMBULATORY_CARE_PROVIDER_SITE_OTHER): Payer: Self-pay | Admitting: *Deleted

## 2024-12-14 ENCOUNTER — Other Ambulatory Visit (HOSPITAL_COMMUNITY): Payer: Self-pay

## 2024-12-14 NOTE — Telephone Encounter (Signed)
 Pharmacy Patient Advocate Encounter   Received notification from Fax that prior authorization for Ajovy  is required/requested.   Insurance verification completed.   The patient is insured through [facility].   Per test claim: PA required; PA submitted to above mentioned insurance via Latent Key/confirmation #/EOC [facility] Status is pending

## 2024-12-18 ENCOUNTER — Other Ambulatory Visit: Payer: Self-pay | Admitting: Neurology

## 2024-12-18 MED ORDER — AIMOVIG 140 MG/ML ~~LOC~~ SOAJ: SUBCUTANEOUS | 11 refills | Status: AC

## 2024-12-18 NOTE — Telephone Encounter (Signed)
" ° ° ° °  Hello Prescriber!  We are in the process of submitting a prior authorization for your patient for Ajovy . We are reaching out for clinical guidance for the following information to complete the request: The patient's plan requires documentation showing that patient to has tried and failed, or have contraindication to the following agents before they will approve our request: Aimovig  and Qulipta. Or please advise if you would like to switch prescribed medication per insurance requirement.   Thank you! Pharmacy Team  "

## 2024-12-19 ENCOUNTER — Telehealth: Payer: Self-pay

## 2024-12-19 ENCOUNTER — Other Ambulatory Visit (HOSPITAL_COMMUNITY): Payer: Self-pay

## 2024-12-19 NOTE — Telephone Encounter (Signed)
 Pharmacy Patient Advocate Encounter   Received notification from Physician's Office that prior authorization for Aimovig  is required/requested.   Insurance verification completed.   The patient is insured through [facility].   Per test claim: PA required; PA submitted to above mentioned insurance via Latent Key/confirmation #/EOC [id number] Status is pending

## 2024-12-20 NOTE — Telephone Encounter (Signed)
 Spoke with patient and discussed that Aimovig  injection was ordered to replace Ajovy  since insurance prefers Aimovig . Pt verbalized understanding and her questions were answered. Next f/u 05/20/25 at 1 pm. She is to call in the meantime with any concerns. She thanked me for the call.

## 2024-12-20 NOTE — Telephone Encounter (Signed)
 Receieved a faxed form requesting add info-faxed form to [employer].

## 2024-12-24 ENCOUNTER — Other Ambulatory Visit (HOSPITAL_COMMUNITY): Payer: Self-pay

## 2024-12-24 NOTE — Telephone Encounter (Signed)
 Pharmacy Patient Advocate Encounter  Received notification from [employer] that Prior Authorization for Aimovig  has been DENIED.  Full denial letter will be uploaded to the media tab. See denial reason below.   PA #/Case ID/Reference #: [id number]

## 2024-12-24 NOTE — Telephone Encounter (Signed)
 From last OV 09/19/24:  Update 09/19/2024: She has headache about 28/30 days a month and many have migrainous features.  In the past, she was on Ajovy  and it helped reduce the intensity and frequency of migraine.   Due to pharmacy issues (unsure details) she has not had recently.     She last took 2-3 months ago.    She has headache , mostly on the top of the head and the occiput.   Holding head up and bright lights worsen the pain.   Sleep helps.   Donzell rooms and staying still helps.    NSAIDs don't help much and pain returns.   She has had the headache x 1 week.

## 2024-12-24 NOTE — Telephone Encounter (Signed)
 Called pt and LVM with office number asking for call back. Will ask her how many migraine days per month she is having to see if we can get more specific information for Aimovig  PA.

## 2024-12-24 NOTE — Telephone Encounter (Signed)
 [name]

## 2024-12-24 NOTE — Telephone Encounter (Signed)
 Returned call to pt who stated that she has had 4 migraine days in past month and has had positive response to aimovig  therapy and would like to remain no it. I will route to pa team to being a pa

## 2024-12-25 ENCOUNTER — Telehealth: Payer: Self-pay | Admitting: Pharmacist

## 2024-12-25 NOTE — Telephone Encounter (Signed)
 Appeal has been submitted. Will advise when response is received, please be advised that most companies may take 30 days to make a decision. Appeal letter and supporting clinical documentation have been faxed to [phone] on 12/25/2024 @8 :58 am.  Thank you, Devere Pandy, PharmD Clinical Pharmacist  [facility]  Direct Dial: [phone]

## 2024-12-27 ENCOUNTER — Other Ambulatory Visit: Payer: Self-pay | Admitting: Adult Health

## 2025-01-08 ENCOUNTER — Other Ambulatory Visit (HOSPITAL_COMMUNITY): Payer: Self-pay

## 2025-01-08 NOTE — Telephone Encounter (Signed)
 Per test claim, insurance is now covering the medication. Received a refill too soon-pt filled on 01/15, next fill is due 01/19/2025.

## 2025-05-20 ENCOUNTER — Ambulatory Visit: Admitting: Family Medicine
